# Patient Record
Sex: Male | Born: 1945
Health system: Southern US, Community
[De-identification: ages and names within clinical notes are randomized; demographics above are authoritative.]

## PROBLEM LIST (undated history)

## (undated) DIAGNOSIS — E039 Hypothyroidism, unspecified: Secondary | ICD-10-CM

## (undated) DIAGNOSIS — I2510 Atherosclerotic heart disease of native coronary artery without angina pectoris: Secondary | ICD-10-CM

## (undated) DIAGNOSIS — N400 Enlarged prostate without lower urinary tract symptoms: Secondary | ICD-10-CM

## (undated) DIAGNOSIS — E785 Hyperlipidemia, unspecified: Secondary | ICD-10-CM

## (undated) DIAGNOSIS — G4733 Obstructive sleep apnea (adult) (pediatric): Secondary | ICD-10-CM

## (undated) DIAGNOSIS — I4729 Other ventricular tachycardia: Secondary | ICD-10-CM

## (undated) DIAGNOSIS — Z95810 Presence of automatic (implantable) cardiac defibrillator: Secondary | ICD-10-CM

## (undated) DIAGNOSIS — I472 Ventricular tachycardia: Secondary | ICD-10-CM

## (undated) DIAGNOSIS — I639 Cerebral infarction, unspecified: Secondary | ICD-10-CM

## (undated) DIAGNOSIS — E119 Type 2 diabetes mellitus without complications: Secondary | ICD-10-CM

## (undated) DIAGNOSIS — R739 Hyperglycemia, unspecified: Secondary | ICD-10-CM

## (undated) HISTORY — PX: CARDIAC DEFIBRILLATOR PLACEMENT: SHX171

## (undated) HISTORY — DX: Other ventricular tachycardia: I47.29

## (undated) HISTORY — DX: Presence of automatic (implantable) cardiac defibrillator: Z95.810

## (undated) HISTORY — DX: Type 2 diabetes mellitus without complications: E11.9

## (undated) HISTORY — DX: Ventricular tachycardia: I47.2

## (undated) HISTORY — DX: Obstructive sleep apnea (adult) (pediatric): G47.33

## (undated) HISTORY — DX: Hypothyroidism, unspecified: E03.9

## (undated) HISTORY — DX: Hyperlipidemia, unspecified: E78.5

## (undated) HISTORY — DX: Atherosclerotic heart disease of native coronary artery without angina pectoris: I25.10

---

## 1979-06-22 HISTORY — PX: ABDOMINAL SURGERY: SHX537

## 2014-03-25 ENCOUNTER — Ambulatory Visit: Payer: Self-pay | Admitting: Internal Medicine

## 2014-04-23 ENCOUNTER — Ambulatory Visit: Payer: Self-pay | Admitting: Internal Medicine

## 2014-04-25 ENCOUNTER — Encounter: Payer: Self-pay | Admitting: *Deleted

## 2014-05-08 ENCOUNTER — Encounter: Payer: Self-pay | Admitting: Internal Medicine

## 2014-05-08 ENCOUNTER — Ambulatory Visit (INDEPENDENT_AMBULATORY_CARE_PROVIDER_SITE_OTHER): Payer: Medicare Other | Admitting: Internal Medicine

## 2014-05-08 VITALS — BP 128/82 | HR 56 | Ht 75.0 in | Wt 223.0 lb

## 2014-05-08 DIAGNOSIS — Z95810 Presence of automatic (implantable) cardiac defibrillator: Secondary | ICD-10-CM | POA: Diagnosis not present

## 2014-05-08 DIAGNOSIS — I472 Ventricular tachycardia, unspecified: Secondary | ICD-10-CM

## 2014-05-08 DIAGNOSIS — I255 Ischemic cardiomyopathy: Secondary | ICD-10-CM | POA: Diagnosis not present

## 2014-05-08 DIAGNOSIS — I4589 Other specified conduction disorders: Secondary | ICD-10-CM | POA: Diagnosis not present

## 2014-05-08 DIAGNOSIS — I4729 Other ventricular tachycardia: Secondary | ICD-10-CM

## 2014-05-08 DIAGNOSIS — R001 Bradycardia, unspecified: Secondary | ICD-10-CM | POA: Diagnosis not present

## 2014-05-08 LAB — MDC_IDC_ENUM_SESS_TYPE_INCLINIC
Date Time Interrogation Session: 20151118050000
HIGH POWER IMPEDANCE MEASURED VALUE: 40 Ohm
HIGH POWER IMPEDANCE MEASURED VALUE: 52 Ohm
Lead Channel Impedance Value: 547 Ohm
Lead Channel Pacing Threshold Amplitude: 1.1 V
Lead Channel Pacing Threshold Pulse Width: 0.4 ms
Lead Channel Sensing Intrinsic Amplitude: 8.4 mV
Lead Channel Setting Sensing Sensitivity: 0.6 mV
MDC IDC PG SERIAL: 102535
MDC IDC SET LEADCHNL RV PACING AMPLITUDE: 2.4 V
MDC IDC SET LEADCHNL RV PACING PULSEWIDTH: 0.4 ms
MDC IDC SET ZONE DETECTION INTERVAL: 300 ms
Zone Setting Detection Interval: 353 ms

## 2014-05-08 NOTE — Progress Notes (Signed)
ELECTROPHYSIOLOGY CONSULT NOTE  Patient ID: Grant [name], MRN: [id number], DOB/AGE: [birth year] 68 y.o. Admit date: (Not on file) Date of Consult: 05/08/2014  Primary Physician: No primary care provider on file. Primary Cardiologist: new  Chief Complaint: b    HPI Grant HurtRobert Junior Zamorano is a 68 y.o. male  To establish cardiovascular care hearing [place].  He has a history of coronary artery disease  he has a  history of nonsustained ventricular tachycardia described in the chart as polymorphic for which  He  underwent ICD implantation initially in 2007; apparently after that he underwent catheterization and was stented. This raises the possibility of my mind that he was having ischemic polymorphic VT ultimately treated by stenting.  He underwent generator replacement/13. At which time his right atrial lead was apparently. We do not have records as to why this was the case. He has complaints of exercise intolerance manifested by dyspnea on exertion at less than one flight of stairs. He has intermittent peripheral edema. He's had no significant chest discomfort. He also notes exertional discomfort and tingling in his hands bilaterally. .     This  is a [employer], [employer] device-single-chamber. Most recent echocardiogram 9/13 demonstrated ejection fraction of 50-55%.  He has a remote history of alcohol abuse. He continues to smoke  Past Medical History  Diagnosis Date  . CAD (coronary artery disease)   . NSVT (nonsustained ventricular tachycardia)   . AICD (automatic cardioverter/defibrillator) present   . DM (diabetes mellitus)   . HLD (hyperlipidemia)   . Hypothyroidism   . Obstructive sleep apnea       Surgical History: No past surgical history on file.   Home Meds: Prior to Admission medications   Medication Sig Start Date End Date Taking? Authorizing Provider  albuterol (PROVENTIL HFA;VENTOLIN HFA) 108 (90 BASE) MCG/ACT inhaler Inhale 2 puffs into the lungs every  6 (six) hours as needed for wheezing or shortness of breath.   Yes Historical Provider, MD  aspirin 325 MG tablet Take 325 mg by mouth.   Yes Historical Provider, MD  atorvastatin (LIPITOR) 40 MG tablet Take 40 mg by mouth daily. Take 1/2 tablet daily   Yes Historical Provider, MD  furosemide (LASIX) 20 MG tablet Take 20 mg by mouth 2 (two) times daily.   Yes Historical Provider, MD  LEVOTHYROXINE SODIUM PO Take by mouth daily. Dosage unknown.[name]. Confer with PT.Marland Kitchen.   Yes Historical Provider, MD  losartan (COZAAR) 100 MG tablet Take 100 mg by mouth daily.   Yes Historical Provider, MD  metoprolol (LOPRESSOR) 50 MG tablet Take 50 mg by mouth 2 (two) times daily.   Yes Historical Provider, MD  nitroGLYCERIN (NITROSTAT) 0.4 MG SL tablet Place 0.4 mg under the tongue every 5 (five) minutes as needed for chest pain.   Yes Historical Provider, MD  omeprazole (PRILOSEC) 20 MG capsule Take 20 mg by mouth daily.   Yes Historical Provider, MD  potassium chloride (KLOR-CON) 20 MEQ packet Take 20 mEq by mouth daily.   Yes Historical Provider, MD  spironolactone (ALDACTONE) 25 MG tablet Take 25 mg by mouth daily.   Yes Historical Provider, MD  terazosin (HYTRIN) 2 MG capsule Take 2 mg by mouth at bedtime.   Yes Historical Provider, MD      Allergies: No Known Allergies  History   Social History  . Marital Status: Married    Spouse Name: N/A    Number of Children: N/A  . Years of Education: N/A  Occupational History  . Not on file.   Social History Main Topics  . Smoking status: Current Every Day Smoker  . Smokeless tobacco: Not on file  . Alcohol Use: Not on file  . Drug Use: Not on file  . Sexual Activity: Not on file   Other Topics Concern  . Not on file   Social History Narrative     Family History  Problem Relation Age of Onset  . CAD Mother   . Hypertension Mother   . Cancer - Colon Father   . Cancer - Colon Brother      ROS:  Please see the history of present illness.     All  other systems reviewed and negative.    Physical Exam:   Blood pressure 128/82, pulse 56, height 6\' 3"  (1.905 m), weight 101.152 kg (223 lb). General: Well developed, well nourished male in no acute distress. Head: Normocephalic, atraumatic, sclera non-icteric, no xanthomas, nares are without discharge. EENT: normal Lymph Nodes:  none Back: without scoliosis/kyphosis , no CVA tendersness Neck: Negative for carotid bruits. JVD not elevated. Lungs: Clear bilaterally to auscultation without wheezes, rales, or rhonchi. Breathing is unlabored. Heart: RRR with S1 S2. No * murmur , rubs, or gallops appreciated. Abdomen: Soft, non-tender, non-distended with normoactive bowel sounds. No hepatomegaly. No rebound/guarding. No obvious abdominal masses. Msk:  Strength and tone appear normal for age. Extremities: No clubbing or cyanosis. No  edema.  Distal pedal pulses are 2+ and equal bilaterally. Skin: Warm and Dry Neuro: Alert and oriented X 3. CN III-XII intact Grossly normal sensory and motor function . Psych:  Responds to questions appropriately with a normal affect.      Labs:  EKG: NSR 54 15./09/42 Poss imi  Assessment and Plan:   Implantable defibrillator   ischemic heart disease with prior stenting  Obstructive sleep apnea  Ventricular tachycardia-polymorphic  Dyspnea on exertion  Sinus bradycardia  The patient's present defibrillator for which the atrial [facility]. The reasons are not clear. He has resting bradycardia and dyspnea on exertion and the first question that is prompted is whether he has chronotropic incompetence. We discussed various strategies but we'll pursue treadmill testing to assess this.  I reviewed with him the importance of monitoring of his blood work for his Aldactone and other medications. Recheck his metabolic profile today. I will see him again in 6 months time in this regard.        Sherryl MangesSteven  Klein

## 2014-05-08 NOTE — Patient Instructions (Signed)
Your physician recommends that you continue on your current medications as directed. Please refer to the Current Medication list given to you today.  Labs today: CBCD, CMET  Your physician has requested that you have an exercise tolerance test. For further information please visit [email or web address]. Please also follow instruction sheet, as given.  Your physician wants you to follow-up in: 6 months with Dr. Graciela HusbandsKlein. You will receive a reminder letter in the mail two months in advance. If you don't receive a letter, please call our office to schedule the follow-up appointment.

## 2014-05-09 LAB — COMPREHENSIVE METABOLIC PANEL
ALK PHOS: 62 U/L (ref 39–117)
ALT: 32 U/L (ref 0–53)
AST: 32 U/L (ref 0–37)
Albumin: 4.1 g/dL (ref 3.5–5.2)
BILIRUBIN TOTAL: 0.5 mg/dL (ref 0.2–1.2)
BUN: 10 mg/dL (ref 6–23)
CO2: 29 meq/L (ref 19–32)
Calcium: 8.7 mg/dL (ref 8.4–10.5)
Chloride: 105 mEq/L (ref 96–112)
Creatinine, Ser: 1.4 mg/dL (ref 0.4–1.5)
GFR: 66.16 mL/min (ref >60.00)
Glucose, Bld: 92 mg/dL (ref 70–99)
POTASSIUM: 4.2 meq/L (ref 3.5–5.1)
SODIUM: 141 meq/L (ref 135–145)
TOTAL PROTEIN: 6.8 g/dL (ref 6.0–8.3)

## 2014-05-09 LAB — CBC WITH DIFFERENTIAL/PLATELET
BASOS ABS: 0 10*3/uL (ref 0.0–0.1)
Basophils Relative: 0.5 % (ref 0.0–3.0)
EOS ABS: 0.1 10*3/uL (ref 0.0–0.7)
Eosinophils Relative: 1.5 % (ref 0.0–5.0)
HEMATOCRIT: 46.9 % (ref 39.0–52.0)
Hemoglobin: 15.6 g/dL (ref 13.0–17.0)
LYMPHS ABS: 3.3 10*3/uL (ref 0.7–4.0)
Lymphocytes Relative: 41.5 % (ref 12.0–46.0)
MCHC: 33.2 g/dL (ref 30.0–36.0)
MCV: 91.1 fl (ref 78.0–100.0)
MONO ABS: 0.5 10*3/uL (ref 0.1–1.0)
Monocytes Relative: 6.4 % (ref 3.0–12.0)
Neutro Abs: 4 10*3/uL (ref 1.4–7.7)
Neutrophils Relative %: 50.1 % (ref 43.0–77.0)
Platelets: 189 10*3/uL (ref 150.0–400.0)
RBC: 5.15 Mil/uL (ref 4.22–5.81)
RDW: 13 % (ref 11.5–15.5)
WBC: 8 10*3/uL (ref 4.0–10.5)

## 2014-05-14 ENCOUNTER — Encounter: Payer: Self-pay | Admitting: Internal Medicine

## 2014-05-28 ENCOUNTER — Encounter: Payer: Medicare Other | Admitting: Nurse Practitioner

## 2014-06-04 ENCOUNTER — Ambulatory Visit (INDEPENDENT_AMBULATORY_CARE_PROVIDER_SITE_OTHER): Payer: Medicare Other | Admitting: Internal Medicine

## 2014-06-04 ENCOUNTER — Other Ambulatory Visit (INDEPENDENT_AMBULATORY_CARE_PROVIDER_SITE_OTHER): Payer: Medicare Other

## 2014-06-04 ENCOUNTER — Encounter: Payer: Self-pay | Admitting: Internal Medicine

## 2014-06-04 VITALS — BP 138/90 | HR 55 | Temp 98.1°F | Resp 16 | Ht 74.0 in | Wt 225.0 lb

## 2014-06-04 DIAGNOSIS — E039 Hypothyroidism, unspecified: Secondary | ICD-10-CM | POA: Insufficient documentation

## 2014-06-04 DIAGNOSIS — E119 Type 2 diabetes mellitus without complications: Secondary | ICD-10-CM | POA: Diagnosis not present

## 2014-06-04 DIAGNOSIS — E785 Hyperlipidemia, unspecified: Secondary | ICD-10-CM | POA: Diagnosis not present

## 2014-06-04 DIAGNOSIS — Z720 Tobacco use: Secondary | ICD-10-CM | POA: Diagnosis not present

## 2014-06-04 DIAGNOSIS — M150 Primary generalized (osteo)arthritis: Secondary | ICD-10-CM

## 2014-06-04 DIAGNOSIS — N400 Enlarged prostate without lower urinary tract symptoms: Secondary | ICD-10-CM | POA: Diagnosis not present

## 2014-06-04 DIAGNOSIS — I255 Ischemic cardiomyopathy: Secondary | ICD-10-CM

## 2014-06-04 DIAGNOSIS — M159 Polyosteoarthritis, unspecified: Secondary | ICD-10-CM

## 2014-06-04 DIAGNOSIS — I10 Essential (primary) hypertension: Secondary | ICD-10-CM | POA: Insufficient documentation

## 2014-06-04 DIAGNOSIS — I2510 Atherosclerotic heart disease of native coronary artery without angina pectoris: Secondary | ICD-10-CM | POA: Insufficient documentation

## 2014-06-04 DIAGNOSIS — E1122 Type 2 diabetes mellitus with diabetic chronic kidney disease: Secondary | ICD-10-CM | POA: Insufficient documentation

## 2014-06-04 DIAGNOSIS — M1990 Unspecified osteoarthritis, unspecified site: Secondary | ICD-10-CM | POA: Insufficient documentation

## 2014-06-04 DIAGNOSIS — F17210 Nicotine dependence, cigarettes, uncomplicated: Secondary | ICD-10-CM | POA: Insufficient documentation

## 2014-06-04 LAB — LIPID PANEL
CHOL/HDL RATIO: 7
CHOLESTEROL: 220 mg/dL — AB (ref 0–200)
HDL: 32.7 mg/dL — AB (ref >39.00)
NonHDL: 187.3
Triglycerides: 244 mg/dL — ABNORMAL HIGH (ref 0.0–149.0)
VLDL: 48.8 mg/dL — AB (ref 0.0–40.0)

## 2014-06-04 LAB — TSH: TSH: 2.34 u[IU]/mL (ref 0.35–4.50)

## 2014-06-04 MED ORDER — TERAZOSIN HCL 5 MG PO CAPS: 5.0000 mg | ORAL_CAPSULE | Freq: Every day | ORAL | Status: DC

## 2014-06-04 NOTE — Assessment & Plan Note (Signed)
Patient currently taking 2 mg terazosin with unsatisfying results. Will increase to 5 mg daily.

## 2014-06-04 NOTE — Assessment & Plan Note (Signed)
Does fairly well overall, occasionally has pains. Advised him that Tylenol is the safest thing he can take for his pain.

## 2014-06-04 NOTE — Patient Instructions (Signed)
We will have you increase your terazosin. Until you run out take 2 pills at nighttime (4 mg total). When you fill the new prescription it will be for 5 mg so take 1 pill at nighttime (5 mg total).  We will check your blood work today and call you back with the results.  We will see back next year for your physical. If you have any new problem is her questions before then please feel free to call our office.

## 2014-06-04 NOTE — Progress Notes (Signed)
Pre visit review using our clinic review tool, if applicable. No additional management support is needed unless otherwise documented below in the visit note. 

## 2014-06-04 NOTE — Progress Notes (Signed)
   Subjective:    Patient ID: Grant [name], male    DOB: 1945-09-21, 68 y.o.   MRN: [id number]  HPI Patient is a 68 year old man who comes in today as a new patient. He does have past medical history of diet-controlled diabetes, arthritis, high blood pressure, CAD, he does have an ICD in place. He denies any current chest pains, shortness of breath, abdominal pains. He did have colonoscopy about 5 years ago. He does have osteoarthritis and states it is fairly severe in his knees as well as his thumbs bilaterally. He denies any balance problems or falls. He does also have a new complaint which is that his BPH is not very well controlled. He currently has medication that he takes at night time however he still gets up to be in the night and he also has times where he needs to run to the bathroom during the day. He denies any urinary retention.  Review of Systems  Constitutional: Negative for fever, activity change, appetite change, fatigue and unexpected weight change.  HENT: Negative.   Respiratory: Negative for cough, chest tightness, shortness of breath and wheezing.   Cardiovascular: Negative for chest pain, palpitations and leg swelling.  Gastrointestinal: Negative for nausea, abdominal pain, diarrhea, constipation and abdominal distention.  Genitourinary: Positive for frequency.       Nocturia  Musculoskeletal: Positive for arthralgias. Negative for myalgias, back pain, joint swelling and gait problem.  Skin: Negative.   Neurological: Negative for dizziness, syncope, weakness, light-headedness and headaches.      Objective:   Physical Exam  Constitutional: He is oriented to person, place, and time. He appears well-developed and well-nourished.  HENT:  Head: Normocephalic and atraumatic.  Eyes: EOM are normal.  Neck: Normal range of motion.  Cardiovascular: Normal rate and regular rhythm.   Pulmonary/Chest: Effort normal and breath sounds normal. No respiratory distress. He has  no wheezes. He has no rales.  Abdominal: Soft. Bowel sounds are normal.  Neurological: He is alert and oriented to person, place, and time. Coordination normal.  Skin: Skin is warm and dry.   Filed Vitals:   06/04/14 1404  BP: 138/90  Pulse: 55  Temp: 98.1 F (36.7 C)  TempSrc: Oral  Resp: 16  Height: 6\' 2"  (1.88 m)  Weight: 225 lb (102.059 kg)  SpO2: 96%      Assessment & Plan:

## 2014-06-04 NOTE — Assessment & Plan Note (Signed)
Patient smoking about half pack per day. Did remind  Him of the risks and harms associated with tobacco smoke. He does not wish to quit at this time.

## 2014-06-04 NOTE — Assessment & Plan Note (Addendum)
[facility] Foot exam, eye exam done in September of this year. He is unclear if they've checked his urine for protein recently. Patient on ARB. Will get records.

## 2014-06-04 NOTE — Assessment & Plan Note (Signed)
Check TSH, patient unclear what dosing of thyroid medication he is taking. Will call us when he gets home to see the bottle.

## 2014-06-05 LAB — LDL CHOLESTEROL, DIRECT: LDL DIRECT: 139.6 mg/dL

## 2014-07-02 ENCOUNTER — Telehealth: Payer: Self-pay | Admitting: Internal Medicine

## 2014-07-02 NOTE — Telephone Encounter (Signed)
Pt requests call [phone], pt having problems controlling his bladder, wants to speak to assistant.

## 2014-07-03 NOTE — Telephone Encounter (Signed)
I spoke with patient. He is having frequent urination and we have scheduled an appointment for him to come in and be seen.

## 2014-07-05 ENCOUNTER — Encounter: Payer: Self-pay | Admitting: Internal Medicine

## 2014-07-05 ENCOUNTER — Ambulatory Visit (INDEPENDENT_AMBULATORY_CARE_PROVIDER_SITE_OTHER): Payer: Medicare Other | Admitting: Internal Medicine

## 2014-07-05 VITALS — BP 140/82 | HR 59 | Temp 98.1°F | Resp 14 | Ht 75.0 in | Wt 229.0 lb

## 2014-07-05 DIAGNOSIS — N400 Enlarged prostate without lower urinary tract symptoms: Secondary | ICD-10-CM | POA: Diagnosis not present

## 2014-07-05 DIAGNOSIS — R350 Frequency of micturition: Secondary | ICD-10-CM | POA: Diagnosis not present

## 2014-07-05 LAB — POCT URINALYSIS DIPSTICK
Bilirubin, UA: NEGATIVE
Blood, UA: NEGATIVE
Glucose, UA: NEGATIVE
Ketones, UA: NEGATIVE
LEUKOCYTES UA: NEGATIVE
Nitrite, UA: NEGATIVE
PROTEIN UA: NEGATIVE
Spec Grav, UA: 1.02
UROBILINOGEN UA: 0.2
pH, UA: 7

## 2014-07-05 MED ORDER — TERAZOSIN HCL 5 MG PO CAPS: 5.0000 mg | ORAL_CAPSULE | Freq: Two times a day (BID) | ORAL | Status: DC

## 2014-07-05 NOTE — Patient Instructions (Signed)
You do not have a urine infection. We will increase your terazosin to twice a day. It seems to be doing a good job at night time but not as good during the day. Take 1 pill in the morning and 1 pill at night time before bed.  If you are still having problems please feel free to call us back.   Benign Prostatic Hypertrophy The prostate gland is part of the reproductive system of men. A normal prostate is about the size and shape of a walnut. The prostate gland produces a fluid that is mixed with sperm to make semen. This gland surrounds the urethra and is located in front of the rectum and just below the bladder. The bladder is where urine is stored. The urethra is the tube through which urine passes from the bladder to get out of the body. The prostate grows as a man ages. An enlarged prostate not caused by cancer is called benign prostatic hypertrophy (BPH). An enlarged prostate can press on the urethra. This can make it harder to pass urine. In the early stages of enlargement, the bladder can get by with a narrowed urethra by forcing the urine through. If the problem gets worse, medical or surgical treatment may be required.  This condition should be followed by your health care provider. The accumulation of urine in the bladder can cause infection. Back pressure and infection can progress to bladder damage and kidney (renal) failure. If needed, your health care provider may refer you to a specialist in kidney and prostate disease (urologist). CAUSES  BPH is a common health problem in men older than 50 years. This condition is a normal part of aging. However, not all men will develop problems from this condition. If the enlargement grows away from the urethra, then there will not be any compression of the urethra and resistance to urine flow.If the growth is toward the urethra and compresses it, you will experience difficulty urinating.  SYMPTOMS   Not able to completely empty your bladder.  Getting  up often during the night to urinate.  Need to urinate frequently during the day.  Difficultly starting urine flow.  Decrease in size and strength of your urine stream.  Dribbling after urination.  Pain on urination (more common with infection).  Inability to pass urine. This needs immediate treatment.  The development of a urinary tract infection. DIAGNOSIS  These tests will help your health care provider understand your problem:  A thorough history and physical examination.  A urination history, with the number of times you urinate, the amounts of urine, the strength of the urine stream, and the feeling of emptiness or fullness after urinating.  A postvoid bladder scan that measures any amount of urine that may remain in your bladder after you finish urinating.  Digital rectal exam. In a rectal exam, your health care provider checks your prostate by putting a gloved, lubricated finger into your rectum to feel the back of your prostate gland. This exam detects the size of your gland and abnormal lumps or growths.  Exam of your urine (urinalysis).  Prostate specific antigen (PSA) screening. This is a blood test used to screen for prostate cancer.  Rectal ultrasonography. This test uses sound waves to electronically produce a picture of your prostate gland. TREATMENT  Once symptoms begin, your health care provider will monitor your condition. Of the men with this condition, one third will have symptoms that stabilize, one third will have symptoms that improve, and one  third will have symptoms that progress in the first year. Mild symptoms may not need treatment. Simple observation and yearly exams may be all that is required. Medicines and surgery are options for more severe problems. Your health care provider can help you make an informed decision for what is best. Two classes of medicines are available for relief of prostate symptoms:  Medicines that shrink the prostate. This  helps relieve symptoms. These medicines take time to work, and it may be months before any improvement is seen.  Uncommon side effects include problems with sexual function.  Medicines to relax the muscle of the prostate. This also relieves the obstruction by reducing any compression on the urethra.This group of medicines work much faster than those that reduce the size of the prostate gland. Usually, one can experience improvement in days to weeks..  Side effects can include dizziness, fatigue, lightheadedness, and retrograde ejaculation (diminished volume of ejaculate). Several types of surgical treatments are available for relief of prostate symptoms:  Transurethral resection of the prostate (TURP)--In this treatment, an instrument is inserted through opening at the tip of the penis. It is used to cut away pieces of the inner core of the prostate. The pieces are removed through the same opening of the penis. This removes the obstruction and helps get rid of the symptoms.  Transurethral incision (TUIP)--In this procedure, small cuts are made in the prostate. This lessens the prostates pressure on the urethra.  Transurethral microwave thermotherapy (TUMT)--This procedure uses microwaves to create heat. The heat destroys and removes a small amount of prostate tissue.  Transurethral needle ablation (TUNA)--This is a procedure that uses radio frequencies to do the same as TUMT.  Interstitial laser coagulation (ILC)--This is a procedure that uses a laser to do the same as TUMT and TUNA.  Transurethral electrovaporization (TUVP)--This is a procedure that uses electrodes to do the same as the procedures listed above. SEEK MEDICAL CARE IF:   You develop a fever.  There is unexplained back pain.  Symptoms are not helped by medicines prescribed.  You develop side effects from the medicine you are taking.  Your urine becomes very dark or has a bad smell.  Your lower abdomen becomes distended  and you have difficulty passing your urine. SEEK IMMEDIATE MEDICAL CARE IF:   You are suddenly unable to urinate. This is an emergency. You should be seen immediately.  There are large amounts of blood or clots in the urine.  Your urinary problems become unmanageable.  You develop lightheadedness, severe dizziness, or you feel faint.  You develop moderate to severe low back or flank pain.  You develop chills or fever. Document Released: 06/07/2005 Document Revised: 06/12/2013 Document Reviewed: 12/21/2012 [facility] Patient Information 2015 [employer] [place]. This information is not intended to replace advice given to you by your health care provider. Make sure you discuss any questions you have with your health care provider.

## 2014-07-05 NOTE — Assessment & Plan Note (Signed)
Increase terazosin to BID 5 mg. He is doing better with nighttime urination but having frequency and urgency during the day. POC urine dipstick without signs of infection or prostatitis.

## 2014-07-05 NOTE — Progress Notes (Signed)
   Subjective:    Patient ID: Grant [name], male    DOB: 1945/10/26, 69 y.o.   MRN: [id number]  HPI The patient is a 69 YO man who is coming in acutely with urinary frequency. We did increase the dosing of terazosin at last visit. He is doing better at night time but still having frequency during the day and having to run to bathrooms. He denies any pain or discharge with urination. Denies fevers or chills. He denies suprapubic tenderness.   Review of Systems  Constitutional: Negative.   Respiratory: Negative.   Cardiovascular: Negative.   Genitourinary: Positive for urgency and frequency. Negative for dysuria, flank pain, decreased urine volume, discharge, enuresis, difficulty urinating and genital sores.  Musculoskeletal: Negative.   Neurological: Negative.       Objective:   Physical Exam  Constitutional: He is oriented to person, place, and time. He appears well-developed and well-nourished.  HENT:  Head: Normocephalic and atraumatic.  Eyes: EOM are normal.  Neck: Normal range of motion.  Cardiovascular: Normal rate and regular rhythm.   Pulmonary/Chest: Effort normal and breath sounds normal. No respiratory distress. He has no wheezes. He has no rales.  Abdominal: Soft. Bowel sounds are normal.  Neurological: He is alert and oriented to person, place, and time. Coordination normal.  Skin: Skin is warm and dry.   Filed Vitals:   07/05/14 1328  BP: 140/82  Pulse: 59  Temp: 98.1 F (36.7 C)  TempSrc: Oral  Resp: 14  Height: 6\' 3"  (1.905 m)  Weight: 229 lb (103.874 kg)  SpO2: 97%      Assessment & Plan:

## 2014-07-05 NOTE — Progress Notes (Signed)
Pre visit review using our clinic review tool, if applicable. No additional management support is needed unless otherwise documented below in the visit note. 

## 2014-07-09 ENCOUNTER — Encounter: Payer: Medicare Other | Admitting: Physician Assistant

## 2014-08-06 ENCOUNTER — Telehealth: Payer: Self-pay | Admitting: Internal Medicine

## 2014-08-06 NOTE — Telephone Encounter (Signed)
Informed pt that transmission is automatic. Pt verbalized understanding.

## 2014-08-06 NOTE — Telephone Encounter (Signed)
New Msg        Pt calling to info about remote device check.   States he needs instructions on what to do.  Please return call.

## 2014-08-08 ENCOUNTER — Ambulatory Visit (INDEPENDENT_AMBULATORY_CARE_PROVIDER_SITE_OTHER): Payer: Medicare Other | Admitting: *Deleted

## 2014-08-08 DIAGNOSIS — I255 Ischemic cardiomyopathy: Secondary | ICD-10-CM | POA: Diagnosis not present

## 2014-08-08 LAB — MDC_IDC_ENUM_SESS_TYPE_REMOTE
Battery Remaining Longevity: 132 mo
Brady Statistic RV Percent Paced: 0 %
Date Time Interrogation Session: 20160218162400
HighPow Impedance: 47 Ohm
Lead Channel Impedance Value: 522 Ohm
Lead Channel Pacing Threshold Amplitude: 1.1 V
Lead Channel Setting Pacing Pulse Width: 0.4 ms
MDC IDC MSMT BATTERY REMAINING PERCENTAGE: 100 %
MDC IDC MSMT LEADCHNL RV PACING THRESHOLD PULSEWIDTH: 0.4 ms
MDC IDC PG SERIAL: 102535
MDC IDC SET LEADCHNL RV PACING AMPLITUDE: 2.4 V
MDC IDC SET LEADCHNL RV SENSING SENSITIVITY: 0.6 mV
Zone Setting Detection Interval: 300 ms
Zone Setting Detection Interval: 353 ms

## 2014-08-08 NOTE — Progress Notes (Signed)
Remote ICD transmission.   

## 2014-08-28 ENCOUNTER — Encounter: Payer: Self-pay | Admitting: *Deleted

## 2014-08-29 ENCOUNTER — Encounter: Payer: Self-pay | Admitting: Internal Medicine

## 2014-11-22 ENCOUNTER — Encounter: Payer: Self-pay | Admitting: *Deleted

## 2015-03-04 ENCOUNTER — Ambulatory Visit (INDEPENDENT_AMBULATORY_CARE_PROVIDER_SITE_OTHER): Payer: Medicare Other | Admitting: Internal Medicine

## 2015-03-04 ENCOUNTER — Encounter: Payer: Self-pay | Admitting: Internal Medicine

## 2015-03-04 VITALS — BP 122/82 | HR 57 | Ht 74.0 in | Wt 233.2 lb

## 2015-03-04 DIAGNOSIS — I472 Ventricular tachycardia, unspecified: Secondary | ICD-10-CM

## 2015-03-04 DIAGNOSIS — I255 Ischemic cardiomyopathy: Secondary | ICD-10-CM

## 2015-03-04 LAB — CUP PACEART INCLINIC DEVICE CHECK
Date Time Interrogation Session: 20160913040000
HIGH POWER IMPEDANCE MEASURED VALUE: 49 Ohm
HighPow Impedance: 40 Ohm
Lead Channel Pacing Threshold Amplitude: 1.1 V
Lead Channel Pacing Threshold Pulse Width: 0.4 ms
Lead Channel Setting Sensing Sensitivity: 0.6 mV
MDC IDC MSMT LEADCHNL RV IMPEDANCE VALUE: 534 Ohm
MDC IDC MSMT LEADCHNL RV SENSING INTR AMPL: 7.1 mV
MDC IDC SET LEADCHNL RV PACING AMPLITUDE: 2.4 V
MDC IDC SET LEADCHNL RV PACING PULSEWIDTH: 0.4 ms
MDC IDC SET ZONE DETECTION INTERVAL: 353 ms
MDC IDC STAT BRADY RV PERCENT PACED: 1 % — AB
Pulse Gen Serial Number: 102535
Zone Setting Detection Interval: 300 ms

## 2015-03-04 LAB — BASIC METABOLIC PANEL
BUN: 9 mg/dL (ref 6–23)
CHLORIDE: 104 meq/L (ref 96–112)
CO2: 31 mEq/L (ref 19–32)
Calcium: 8.7 mg/dL (ref 8.4–10.5)
Creatinine, Ser: 1.26 mg/dL (ref 0.40–1.50)
GFR: 72.7 mL/min (ref >60.00)
GLUCOSE: 128 mg/dL — AB (ref 70–99)
Potassium: 3.6 mEq/L (ref 3.5–5.1)
Sodium: 141 mEq/L (ref 135–145)

## 2015-03-04 NOTE — Patient Instructions (Signed)
Medication Instructions: - no changes  Labwork: - Your physician recommends that you have lab work today: BMP  Procedures/Testing: - none  Follow-Up: - Remote monitoring is used to monitor your Pacemaker of ICD from home. This monitoring reduces the number of office visits required to check your device to one time per year. It allows [place] to keep an eye on the functioning of your device to ensure it is working properly. You are scheduled for a device check from home on 06/03/15. You may send your transmission at any time that day. If you have a wireless device, the transmission will be sent automatically. After your physician reviews your transmission, you will receive a postcard with your next transmission date.  - Your physician wants you to follow-up in: 1 year with Dr. Graciela Husbands. You will receive a reminder letter in the mail two months in advance. If you don't receive a letter, please call our office to schedule the follow-up appointment.  Any Additional Special Instructions Will Be Listed Below (If Applicable). - none

## 2015-03-04 NOTE — Progress Notes (Signed)
      Patient Care Team: Judie Bonus, MD as PCP - General (Internal Medicine)   HPI  Grant [name] is a 69 y.o. male Seen in follow-up for ICD implanted for ventricular tachycardia in the context of ischemic heart disease prior stenting. He had intervertebral normalization of LV function with an echo EF 2013 55%.  Noted when first evaluated 11/15 to have resting bradycardia  we had anticipated exercise treadmill testing which was never accomplished    Records and Results Reviewed  Past Medical History  Diagnosis Date  . CAD (coronary artery disease)   . NSVT (nonsustained ventricular tachycardia)   . AICD (automatic cardioverter/defibrillator) present   . DM (diabetes mellitus)   . HLD (hyperlipidemia)   . Hypothyroidism   . Obstructive sleep apnea     Past Surgical History  Procedure Laterality Date  . Abdominal surgery  1981    repair lungs, liver, and intenstines from trauma    Current Outpatient Prescriptions  Medication Sig Dispense Refill  . albuterol (PROVENTIL HFA;VENTOLIN HFA) 108 (90 BASE) MCG/ACT inhaler Inhale 2 puffs into the lungs every 6 (six) hours as needed for wheezing or shortness of breath.    Marland Kitchen aspirin 325 MG tablet Take 325 mg by mouth.    Marland Kitchen atorvastatin (LIPITOR) 40 MG tablet Take 40 mg by mouth daily. Take 1/2 tablet daily    . furosemide (LASIX) 20 MG tablet Take 20 mg by mouth 2 (two) times daily.    Marland Kitchen LEVOTHYROXINE SODIUM PO Take by mouth daily. Dosage unknown.[name] with PT..    . losartan (COZAAR) 100 MG tablet Take 100 mg by mouth daily.    . metoprolol (LOPRESSOR) 50 MG tablet Take 50 mg by mouth 2 (two) times daily.    . nitroGLYCERIN (NITROSTAT) 0.4 MG SL tablet Place 0.4 mg under the tongue every 5 (five) minutes as needed for chest pain.    Marland Kitchen omeprazole (PRILOSEC) 20 MG capsule Take 20 mg by mouth daily.    Marland Kitchen spironolactone (ALDACTONE) 25 MG tablet Take 25 mg by mouth daily.    Marland Kitchen terazosin (HYTRIN) 5 MG capsule Take 1  capsule (5 mg total) by mouth 2 (two) times daily. 180 capsule 3   No current facility-administered medications for this visit.    No Known Allergies    Review of Systems negative except from HPI and PMH  Physical Exam BP 122/82 mmHg  Pulse 57  Ht  (1.88 m)  Wt 233 lb 3.2 oz (105.779 kg)  BMI 29.93 kg/m2 Well developed and well nourished in no acute distress HENT normal E scleral and icterus clear Neck Supple JVP flat; carotids brisk and full Clear to ausculation Device pocket well healed; without hematoma or erythema.  There is no tethering Regular rate and rhythm, no murmurs gallops or rub Soft with active bowel sounds No clubbing cyanosis  Edema Alert and oriented, grossly normal motor and sensory function Skin Warm and Dry  ECG  Sinus 57 19/10/42  Assessment and  Plan  Ischemic Cardiomyopathy--interval normalization  Ventricular Tachycardia  ICD  The patient's device was interrogated.  The information was reviewed. No changes were made in the programming.     No intercurrent Ventricular tachycardia  Without symptoms of ischemia

## 2015-03-25 ENCOUNTER — Encounter: Payer: Self-pay | Admitting: Internal Medicine

## 2015-04-10 ENCOUNTER — Telehealth: Payer: Self-pay

## 2015-04-10 NOTE — Telephone Encounter (Signed)
Patient called to educate on Medicare Wellness apt. LVM for the patient to call back to educate and schedule for wellness visit. Next apt is in Dec.

## 2015-04-14 NOTE — Telephone Encounter (Signed)
2nd outreach for AWV and the patient agreed to come in this Friday at 1pm

## 2015-04-18 ENCOUNTER — Telehealth: Payer: Self-pay

## 2015-04-18 ENCOUNTER — Ambulatory Visit (INDEPENDENT_AMBULATORY_CARE_PROVIDER_SITE_OTHER): Payer: Medicare Other

## 2015-04-18 VITALS — BP 130/80 | Ht 75.0 in | Wt 233.2 lb

## 2015-04-18 DIAGNOSIS — N400 Enlarged prostate without lower urinary tract symptoms: Secondary | ICD-10-CM

## 2015-04-18 DIAGNOSIS — Z0000 Encounter for general adult medical examination without abnormal findings: Secondary | ICD-10-CM

## 2015-04-18 MED ORDER — TERAZOSIN HCL 5 MG PO CAPS: 5.0000 mg | ORAL_CAPSULE | Freq: Two times a day (BID) | ORAL | Status: DC

## 2015-04-18 NOTE — Telephone Encounter (Signed)
Call to advise patient to call the office or to call the health coach back at [phone] to schedule fup apt [phone] Dr. Dorise HissKollar. Terazosin 5mg  bid approved and sent to pharmacy to be filled per VO Dr. Dorise HissKollar.  fup Apt to discuss Hep C results and plan of care.

## 2015-04-18 NOTE — Progress Notes (Signed)
Medical screening examination/treatment/procedure(s) were performed by non-physician practitioner and as supervising physician I was immediately available for consultation/collaboration. I agree with above. Alasia Enge A Yukio Bisping, MD 

## 2015-04-18 NOTE — Progress Notes (Signed)
Subjective:   Grant [name] is a 69 y.o. male who presents for Medicare Annual/Subsequent preventive examination.  Review of Systems:   Cardiac Risk Factors include: advanced age (>66men, >84 women);diabetes mellitus;dyslipidemia;hypertension;male gender;obesity (BMI >30kg/m2);smoking/ tobacco exposure HRA assessment completed during visit;  The Patient was informed that this wellness visit is to identify risk and educate on how to reduce risk for increase disease through lifestyle changes.   ROS deferred to CPE exam with physician Patient seen at the [facility]  Medical issues   Hx pain in chest x 4 weeks ago that was relieved by NTG; Dr, Grant [name];  Dr. Joseph [name] at [facility]; to notify doctor if he takes more than 2 NTG needed to relieve pain and to call 911 if pain is not relieved; [facility] more than 2 episodes of chest pain in a week; Gets NTG refilled q year.  States plan for chest pain Checks BS once a week; discussed checking his BS before eating in the am, then perhaps 2 hours after lunch or at hs.   CAD; HTN; Glucose (128) 02/2015; GFR 72;  06/04/2014; cho 220; Trig 244; HDL 32; LDL 139  Diet controlled DM/ A1c at [facility]; states it was good; will get results for Dr. Okey [name]. 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 Sits down at night and starts to think about his health and what he cannot do; Smokes after meals;  Adds a few drinks some evening approx 2 to 3 times per week; Wife states he skips some evenings;   ETOH; Drinks 2 to 3 a day; followed by a couple of shots; but not every  day States he drinks when he "starts thinking" and gets worried. Misses his profession; wants to weld and can't'; worries over health and defibrillator. Focused on strengths; social; can build; wife stated he his tool shed but doesn't make anything anymore. [name] admits he is grieving having to retire and now his health is not as good; C/o of fatigue.  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   Discussed replacing bad thoughts w good thoughts and motivated at level 6 to 7 to make a plan and follow though with one action every day to assist him in achieving his goal.  Also discussed possibility of part time work  4 children; one is here; [place], [place] Married x 3 years to childhood sweat heart.  4 children; one son local    BMI:  Vitals with BMI 03/04/2015 07/05/2014 06/04/2014 05/08/2014  BMI 30 28.7 28.9 27.9   Diet; breakfast; does not like eggs; Biscuits and gravy; tomatoes; oatmeal and wheat toast;  Lunch; healthy lunch; sandwich; chips; Supper; full course at dinner; green beans, potatoes; vegetables   Exercise; c/o of fatigue; states he "starts" thinking and is not doing as much; States he is up and down during the day and does not sit;  Tries to walk around the home but tires easily  SAFETY;  Safety reviewed for the home; clear paths through the home, eliminating clutter, railing as needed; bathroom safety; community safety; smoke detectors and carbon monoxide detectors; firearms safety reviewed  Driving accidents (No) and seatbelt (yes) Sun protection; stays out of sun  Medication review/ New meds  Fall assessment; slipped in bathtub but caught himself; Educated on fall risk safety and putting non skid mat in tub etc. Given information from [facility] on Fall prevention and safety proofing the home.  Gait assessment; no noted issues; Can't climb stairs well due to  sob  Mobilization and Functional losses in the last year. Sleep patterns   Urinary or fecal incontinence reviewed/ states med for prostate is helping   Counseling: Hepatitis C checked at [place];  Ua Microalbumin - Checked at [facility] Foot [facility] - [facility]; States [facility] was to send records and thought he had brought records, but do not see them on the chart EKG: 03/04/2015 Hearing: right ear 10% hearing loss; Left ear 80% hearing loss; has hearing aid for left ear but doesn't wear it all the time Ophthalmology exam; last [facility]; getting new glasses good for reading  Immunizations Due  Shingles Flu Prevnar; (PSV 23 02/2014)  Will [facility] at [facility]    Current Care Team reviewed and updated  Was seeing Dr. Remi Deter T. [name] for liver fup        Objective:    Vitals: BP 130/80 mmHg  Ht  (1.905 m)  Wt 233 lb 4 oz (105.802 kg)  BMI 29.15 kg/m2  Tobacco History  Smoking status  . Current Every Day Smoker -- 0.80 packs/day for 40 years  . Types: Cigarettes  Smokeless tobacco  . Current User  . Types: Chew    Comment: Quit x 6 months; with patches and pills      Ready to quit: Yes Counseling given: Not Answered   Past Medical History  Diagnosis Date  . CAD (coronary artery disease)   . NSVT (nonsustained ventricular tachycardia) (HCC)   . AICD (automatic cardioverter/defibrillator) present   . DM (diabetes mellitus) (HCC)   . HLD (hyperlipidemia)   . Hypothyroidism   . Obstructive sleep apnea    Past Surgical History  Procedure Laterality Date  . Abdominal surgery  1981    repair lungs, liver, and intenstines from trauma   Family History  Problem Relation Age of Onset  . CAD Mother   . Hypertension Mother   . Cancer - Colon Father   . Cancer - Colon Brother    History  Sexual Activity  . Sexual Activity: Not on file    Outpatient Encounter Prescriptions as of 04/18/2015  Medication Sig  . albuterol (PROVENTIL HFA;VENTOLIN HFA) 108 (90 BASE) MCG/ACT inhaler  Inhale 2 puffs into the lungs every 6 (six) hours as needed for wheezing or shortness of breath.  Marland Kitchen aspirin 325 MG tablet Take 325 mg by mouth.  Marland Kitchen atorvastatin (LIPITOR) 40 MG tablet Take 40 mg by mouth daily. Take 1/2 tablet daily  . furosemide (LASIX) 20 MG tablet Take 20 mg by mouth 2 (two) times daily.  Marland Kitchen LEVOTHYROXINE SODIUM PO Take by mouth daily. Dosage unknown.[name] with PT..  . losartan (COZAAR) 100 MG tablet Take 100 mg by mouth daily.  . metoprolol (LOPRESSOR) 50 MG tablet Take 50 mg by mouth 2 (two) times daily.  . nitroGLYCERIN (NITROSTAT) 0.4 MG SL tablet Place 0.4 mg under the tongue every 5 (five) minutes as needed for chest pain.  Marland Kitchen omeprazole (PRILOSEC)  20 MG capsule Take 20 mg by mouth daily.  Marland Kitchen spironolactone (ALDACTONE) 25 MG tablet Take 25 mg by mouth daily.  Marland Kitchen terazosin (HYTRIN) 5 MG capsule Take 1 capsule (5 mg total) by mouth 2 (two) times daily.   No facility-administered encounter medications on file as of 04/18/2015.    Activities of Daily Living In your present state of health, do you have any difficulty performing the following activities: 04/18/2015  Hearing? Y  Vision? N  Difficulty concentrating or making decisions? N  Walking or climbing stairs? N  Dressing or bathing? N  Doing errands, shopping? N  Preparing Food and eating ? N  Using the Toilet? N  In the past six months, have you accidently leaked urine? N  Do you have problems with loss of bowel control? N  Managing your Medications? N  Managing your Finances? N  Housekeeping or managing your Housekeeping? N    Patient Care Team: Myrlene Broker, MD as PCP - General (Internal Medicine)   Assessment:    Assessment  The patient requested refill of Terazosin and Dr. Dorise Hiss verbally agreed to re-order x 90 days.  The patient upon leaving, stated that he was dx with Hepatitis C. Stated he was referred to the GI clinic in [place] for Liver Bx. Once there, he was    Patient presents  for yearly preventative medicine examination. Medicare questionnaire screening were completed, i.e. Functional; fall risk; depression, memory loss and hearing all discussed and the patient is followed at the [place]; Addressed safety issues at home and in bathroom;   All immunizations and health maintenance protocols were reviewed with the patient plans to return to the [place] in [place] for flu, prevnar and shingles   Education provided for laboratory screens;  Chol results 2015 good  Medication reconciliation, past medical history, social history, problem list and allergies were reviewed in detail with the patient  Goals were established with regard to assisting the patient to move forward in his life and address boredom and negativity and focusing on strengths;   End of life planning was discussed.   Exercise Activities and Dietary recommendations Current Exercise Habits:: Home exercise routine, Type of exercise: Other - see comments (is up and down and busy) Goals    . 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 Wood Firefighter;  3. Think about initiating;  Motivation is 6 or 7; to get this done;       Fall Risk Fall Risk  04/18/2015  Falls in the past year? No   Depression Screen PHQ 2/9 Scores 04/18/2015  PHQ - 2 Score 0    Cognitive Testing MMSE - Mini Mental State Exam 04/18/2015  Not completed: (No Data)   AD8 Score 0   Immunization History  Administered Date(s) Administered  . Influenza Whole 03/05/2014  . Pneumococcal Polysaccharide-23 03/05/2014  . Tdap 06/21/2008   Screening Tests Health Maintenance  Topic Date Due  . Hepatitis C Screening  Nov 27, 1945  . URINE MICROALBUMIN  11/15/1954  . ZOSTAVAX  11/14/2004  . HEMOGLOBIN A1C  08/20/2014  . INFLUENZA VACCINE  01/20/2015  . FOOT EXAM  02/20/2015  . OPHTHALMOLOGY EXAM  02/20/2015  . PNA vac Low Risk Adult (2 of 2 - PCV13) 03/06/2015  . COLONOSCOPY  06/21/2018  .  TETANUS/TDAP  06/21/2018      Plan:     The patient requested refill of Terazosin and Dr. Dorise Hiss  verbally agreed to re-order x 90 days. (apt planned within 3 months;  Will call to have the patient schedule a fup visit with Dr. Dorise HissKollar to review BPH and discuss Hepatitis C and fup care;   Will update Prevnar; [place] if he has not had these   Working on stress relief in preparation for smoking cessation and ETOH moderation. 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   During the course of the visit the patient was educated and counseled about the following appropriate screening and preventive services:   Vaccines to include Pneumoccal, Influenza, Hepatitis B, Td, Zostavax, HCV/   Electrocardiogram/ 02/2015  Cardiovascular Disease/ [facility]  Colorectal cancer screening/ deferred   Diabetes screening/ checks at home; Edu to check fbs, 2 h ppg  Prostate Cancer Screening/ deferred to [facility]  Glaucoma screening; eye exam neg; to get new glasses  Nutrition counseling /will focus on low fat at next AWV  Smoking cessation counseling started - Is trying to moderate with smoking 8 cigarettes  per day   Patient Instructions (the written plan) was given to the patient.    Montine CircleHauck,Voris Tigert, RN  04/18/2015

## 2015-04-18 NOTE — Telephone Encounter (Signed)
Agree with plan of care.  thanks

## 2015-04-18 NOTE — Patient Instructions (Addendum)
[name] , Thank you for taking time to come for your Medicare Wellness Visit. I appreciate your ongoing commitment to your health goals. Please review the following plan we discussed and let me know if I can assist you in the future.  (Post visit: Called post visit to confirm reorder of terazosin and to fup with Dr. Dorise Hiss regarding a1c status; To further document in telephone note)  [facility] with [facility] regarding Shingles vaccine Prevnar 13  Flu  Will check on all of these at the [place]   Will get A1c for Dr. Dorise Hiss   May have filled out AD; but given copy     These are the goals we discussed: Goals    . 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 Wood Firefighter;  3. Think about initiating;  Motivation is 6 or 7; to get this done;        This is a list of the screening recommended for you and due dates:  Health Maintenance  Topic Date Due  .  Hepatitis C: One time screening is recommended by Center for Disease Control  (CDC) for  adults born from [birth year] through 1966.   16-Jun-1946  . Urine Protein Check  11/15/1954  . Shingles Vaccine  11/14/2004  . Hemoglobin A1C  08/20/2014  . Flu Shot  01/20/2015  . Complete foot exam   02/20/2015  . Eye exam for diabetics  02/20/2015  . Pneumonia vaccines (2 of 2 - PCV13) 03/06/2015  . Colon Cancer Screening  06/21/2018  . Tetanus Vaccine  06/21/2018     Health Maintenance, Male A healthy lifestyle and preventative care can promote health and wellness.  Maintain regular health, dental, and eye exams.  Eat a healthy diet. Foods like vegetables, fruits, whole grains, low-fat dairy products, and lean protein foods contain the nutrients you need and are low in calories. Decrease your intake of foods high in solid fats, added sugars, and salt. Get information about a proper diet from your health care provider, if necessary.  Regular physical exercise is one of the most important things you can do for  your health. Most adults should get at least 150 minutes of moderate-intensity exercise (any activity that increases your heart rate and causes you to sweat) each week. In addition, most adults need muscle-strengthening exercises on 2 or more days a week.   Maintain a healthy weight. The body mass index (BMI) is a screening tool to identify possible weight problems. It provides an estimate of body fat based on height and weight. Your health care provider can find your BMI and can help you achieve or maintain a healthy weight. For males 20 years and older:  A BMI below 18.5 is considered underweight.  A BMI of 18.5 to 24.9 is normal.  A BMI of 25 to 29.9 is considered overweight.  A BMI of 30 and above is considered obese.  Maintain normal blood lipids and cholesterol by exercising and minimizing your intake of saturated fat. Eat a balanced diet with plenty of fruits and vegetables. Blood tests for lipids and cholesterol should begin at age 67 and be repeated every 5 years. If your lipid or cholesterol levels are high, you are over age 75, or you are at high risk for heart disease, you may need your cholesterol levels checked more frequently.Ongoing high lipid and cholesterol levels should be treated with medicines if diet and exercise are  not working.  If you smoke, find out from your health care provider how to quit. If you do not use tobacco, do not start.  Lung cancer screening is recommended for adults aged 55-80 years who are at high risk for developing lung cancer because of a history of smoking. A yearly low-dose CT scan of the lungs is recommended for people who have at least a 30-pack-year history of smoking and are current smokers or have quit within the past 15 years. A pack year of smoking is smoking an average of 1 pack of cigarettes a day for 1 year (for example, a 30-pack-year history of smoking could mean smoking 1 pack a day for 30 years or 2 packs a day for 15 years). Yearly  screening should continue until the smoker has stopped smoking for at least 15 years. Yearly screening should be stopped for people who develop a health problem that would prevent them from having lung cancer treatment.  If you choose to drink alcohol, do not have more than 2 drinks per day. One drink is considered to be 12 oz (360 mL) of beer, 5 oz (150 mL) of wine, or 1.5 oz (45 mL) of liquor.  Avoid the use of street drugs. Do not share needles with anyone. Ask for help if you need support or instructions about stopping the use of drugs.  High blood pressure causes heart disease and increases the risk of stroke. High blood pressure is more likely to develop in:  People who have blood pressure in the end of the normal range (100-139/85-89 mm Hg).  People who are overweight or obese.  People who are African American.  If you are 7618-70 years of age, have your blood pressure checked every 3-5 years. If you are 70 years of age or older, have your blood pressure checked every year. You should have your blood pressure measured twice--once when you are at a hospital or clinic, and once when you are not at a hospital or clinic. Record the average of the two measurements. To check your blood pressure when you are not at a hospital or clinic, you can use:  An automated blood pressure machine at a pharmacy.  A home blood pressure monitor.  If you are 945-269 years old, ask your health care provider if you should take aspirin to prevent heart disease.  Diabetes screening involves taking a blood sample to check your fasting blood sugar level. This should be done once every 3 years after age 70 if you are at a normal weight and without risk factors for diabetes. Testing should be considered at a younger age or be carried out more frequently if you are overweight and have at least 1 risk factor for diabetes.  Colorectal cancer can be detected and often prevented. Most routine colorectal cancer screening  begins at the age of 70 and continues through age 70. However, your health care provider may recommend screening at an earlier age if you have risk factors for colon cancer. On a yearly basis, your health care provider may provide home test kits to check for hidden blood in the stool. A small camera at the end of a tube may be used to directly examine the colon (sigmoidoscopy or colonoscopy) to detect the earliest forms of colorectal cancer. Talk to your health care provider about this at age 70 when routine screening begins. A direct exam of the colon should be repeated every 5-10 years through age 70, unless early forms of precancerous  polyps or small growths are found.  People who are at an increased risk for hepatitis B should be screened for this virus. You are considered at high risk for hepatitis B if:  You were born in a country where hepatitis B occurs often. Talk with your health care provider about which countries are considered high risk.  Your parents were born in a high-risk country and you have not received a shot to protect against hepatitis B (hepatitis B vaccine).  You have HIV or AIDS.  You use needles to inject street drugs.  You live with, or have sex with, someone who has hepatitis B.  You are a man who has sex with other men (MSM).  You get hemodialysis treatment.  You take certain medicines for conditions like cancer, organ transplantation, and autoimmune conditions.  Hepatitis C blood testing is recommended for all people born from [birth year] through 1965 and any individual with known risk factors for hepatitis C.  Healthy men should no longer receive prostate-specific antigen (PSA) blood tests as part of routine cancer screening. Talk to your health care provider about prostate cancer screening.  Testicular cancer screening is not recommended for adolescents or adult males who have no symptoms. Screening includes self-exam, a health care provider exam, and other screening  tests. Consult with your health care provider about any symptoms you have or any concerns you have about testicular cancer.  Practice safe sex. Use condoms and avoid high-risk sexual practices to reduce the spread of sexually transmitted infections (STIs).  You should be screened for STIs, including gonorrhea and chlamydia if:  You are sexually active and are younger than 24 years.  You are older than 24 years, and your health care provider tells you that you are at risk for this type of infection.  Your sexual activity has changed since you were last screened, and you are at an increased risk for chlamydia or gonorrhea. Ask your health care provider if you are at risk.  If you are at risk of being infected with HIV, it is recommended that you take a prescription medicine daily to prevent HIV infection. This is called pre-exposure prophylaxis (PrEP). You are considered at risk if:  You are a man who has sex with other men (MSM).  You are a heterosexual man who is sexually active with multiple partners.  You take drugs by injection.  You are sexually active with a partner who has HIV.  Talk with your health care provider about whether you are at high risk of being infected with HIV. If you choose to begin PrEP, you should first be tested for HIV. You should then be tested every 3 months for as long as you are taking PrEP.  Use sunscreen. Apply sunscreen liberally and repeatedly throughout the day. You should seek shade when your shadow is shorter than you. Protect yourself by wearing long sleeves, pants, a wide-brimmed hat, and sunglasses year round whenever you are outdoors.  Tell your health care provider of new moles or changes in moles, especially if there is a change in shape or color. Also, tell your health care provider if a mole is larger than the size of a pencil eraser.  A one-time screening for abdominal aortic aneurysm (AAA) and surgical repair of large AAAs by ultrasound is  recommended for men aged 65-75 years who are current or former smokers.  Stay current with your vaccines (immunizations).   This information is not intended to replace advice given to you by  your health care provider. Make sure you discuss any questions you have with your health care provider.   Document Released: 12/04/2007 Document Revised: 06/28/2014 Document Reviewed: 11/02/2010 Elsevier Interactive Patient Education [employer].

## 2015-04-18 NOTE — Telephone Encounter (Signed)
Patient called back; Prescription ready and made fup apt with Dr. Dorise HissKollar to fup on BPH and + hep c/ [facility] and then turned him down regarding liver bx (? On ASA) and sent him home but never followed up;  Apt with Dr. Dorise HissKollar scheduled for 11/15 at 1 pm

## 2015-05-06 ENCOUNTER — Encounter: Payer: Self-pay | Admitting: Internal Medicine

## 2015-05-06 ENCOUNTER — Ambulatory Visit (INDEPENDENT_AMBULATORY_CARE_PROVIDER_SITE_OTHER): Payer: Medicare Other | Admitting: Internal Medicine

## 2015-05-06 VITALS — BP 118/72 | HR 86 | Temp 98.1°F | Resp 20 | Ht 75.0 in | Wt 234.2 lb

## 2015-05-06 DIAGNOSIS — Z720 Tobacco use: Secondary | ICD-10-CM | POA: Diagnosis not present

## 2015-05-06 DIAGNOSIS — R252 Cramp and spasm: Secondary | ICD-10-CM | POA: Insufficient documentation

## 2015-05-06 DIAGNOSIS — Z23 Encounter for immunization: Secondary | ICD-10-CM

## 2015-05-06 DIAGNOSIS — Z8619 Personal history of other infectious and parasitic diseases: Secondary | ICD-10-CM | POA: Diagnosis not present

## 2015-05-06 DIAGNOSIS — I255 Ischemic cardiomyopathy: Secondary | ICD-10-CM | POA: Diagnosis not present

## 2015-05-06 DIAGNOSIS — N400 Enlarged prostate without lower urinary tract symptoms: Secondary | ICD-10-CM | POA: Diagnosis not present

## 2015-05-06 MED ORDER — CYCLOBENZAPRINE HCL 5 MG PO TABS: 5.0000 mg | ORAL_TABLET | Freq: Two times a day (BID) | ORAL | Status: DC | PRN

## 2015-05-06 NOTE — Assessment & Plan Note (Signed)
Talked to him about the need to quit and he does not want to do that at this time.

## 2015-05-06 NOTE — Assessment & Plan Note (Signed)
Unclear when this was found out and if still active. He does not want treatment even if needed so will not pursue further workup at this time.

## 2015-05-06 NOTE — Progress Notes (Signed)
   Subjective:    Patient ID: Grant [name], male    DOB: 1946-04-10, 69 y.o.   MRN: [id number]  HPI The patient is a 69 YO man coming in to talk about his prior diagnosis of hepatitis C. He has not mentioned this before. Was diagnosed at the [facility]. No elevation in his liver enzymes that he knows of. Has not undergone treatment. Does not want to get treatment or further evaluation.  Also wants to have new muscle cramps. Bother him at night time. Squeeze his leg. Not with walking or moving. Tries home remedies which do not help much. Was getting hydrocodone from the [facility] for it but ran out.   Review of Systems  Constitutional: Negative.   Respiratory: Negative.   Cardiovascular: Negative.   Gastrointestinal: Negative.   Genitourinary: Positive for frequency. Negative for dysuria, urgency, flank pain, decreased urine volume, discharge, enuresis, difficulty urinating and genital sores.  Musculoskeletal: Positive for myalgias and arthralgias.  Neurological: Negative.       Objective:   Physical Exam  Constitutional: He is oriented to person, place, and time. He appears well-developed and well-nourished.  HENT:  Head: Normocephalic and atraumatic.  Eyes: EOM are normal.  Neck: Normal range of motion.  Cardiovascular: Normal rate and regular rhythm.   Pulmonary/Chest: Effort normal and breath sounds normal. No respiratory distress. He has no wheezes. He has no rales.  Abdominal: Soft. Bowel sounds are normal.  Neurological: He is alert and oriented to person, place, and time. Coordination normal.  Skin: Skin is warm and dry.   Filed Vitals:   05/06/15 1310  BP: 118/72  Pulse: 86  Temp: 98.1 F (36.7 C)  TempSrc: Oral  Resp: 20  Height: 6\' 3"  (1.905 m)  Weight: 234 lb 4 oz (106.255 kg)  SpO2: 98%      Assessment & Plan:  Flu shot given at visit.

## 2015-05-06 NOTE — Patient Instructions (Addendum)
We have sent in the muscle medicine called flexeril. This helps to relax muscles and should help with the cramps.   If you can send [place] a copy of the recent labs from the [place] so we do not have to repeat blood work.   We have given you the flu shot.   Exercising to Stay Healthy Exercising regularly is important. It has many health benefits, such as:  Improving your overall fitness, flexibility, and endurance.  Increasing your bone density.  Helping with weight control.  Decreasing your body fat.  Increasing your muscle strength.  Reducing stress and tension.  Improving your overall health. In order to become healthy and stay healthy, it is recommended that you do moderate-intensity and vigorous-intensity exercise. You can tell that you are exercising at a moderate intensity if you have a higher heart rate and faster breathing, but you are still able to hold a conversation. You can tell that you are exercising at a vigorous intensity if you are breathing much harder and faster and cannot hold a conversation while exercising. HOW OFTEN SHOULD I EXERCISE? Choose an activity that you enjoy and set realistic goals. Your health care provider can help you to make an activity plan that works for you. Exercise regularly as directed by your health care provider. This may include:   Doing resistance training twice each week, such as:  Push-ups.  Sit-ups.  Lifting weights.  Using resistance bands.  Doing a given intensity of exercise for a given amount of time. Choose from these options:  150 minutes of moderate-intensity exercise every week.  75 minutes of vigorous-intensity exercise every week.  A mix of moderate-intensity and vigorous-intensity exercise every week. Children, pregnant women, people who are out of shape, people who are overweight, and older adults may need to consult a health care provider for individual recommendations. If you have any sort of medical condition, be sure  to consult your health care provider before starting a new exercise program.  WHAT ARE SOME EXERCISE IDEAS? Some moderate-intensity exercise ideas include:   Walking at a rate of 1 mile in 15 minutes.  Biking.  Hiking.  Golfing.  Dancing. Some vigorous-intensity exercise ideas include:   Walking at a rate of at least 4.5 miles per hour.  Jogging or running at a rate of 5 miles per hour.  Biking at a rate of at least 10 miles per hour.  Lap swimming.  Roller-skating or in-line skating.  Cross-country skiing.  Vigorous competitive sports, such as football, basketball, and soccer.  Jumping rope.  Aerobic dancing. WHAT ARE SOME EVERYDAY ACTIVITIES THAT CAN HELP ME TO GET EXERCISE?  Yard work, such as:  Child psychotherapist.  Raking and bagging leaves.  Washing and waxing your car.  Pushing a stroller.  Shoveling snow.  Gardening.  Washing windows or floors. HOW CAN I BE MORE ACTIVE IN MY DAY-TO-DAY ACTIVITIES?  Use the stairs instead of the elevator.  Take a walk during your lunch break.  If you drive, park your car farther away from work or school.  If you take public transportation, get off one stop early and walk the rest of the way.  Make all of your phone calls while standing up and walking around.  Get up, stretch, and walk around every 30 minutes throughout the day. WHAT GUIDELINES SHOULD I FOLLOW WHILE EXERCISING?  Do not exercise so much that you hurt yourself, feel dizzy, or get very short of breath.  Consult your health care  provider before starting a new exercise program.  Wear comfortable clothes and shoes with good support.  Drink plenty of water while you exercise to prevent dehydration or heat stroke. Body water is lost during exercise and must be replaced.  Work out until you breathe faster and your heart beats faster.   This information is not intended to replace advice given to you by your health care provider. Make sure you  discuss any questions you have with your health care provider.   Document Released: 07/10/2010 Document Revised: 06/28/2014 Document Reviewed: 11/08/2013 Elsevier Interactive Patient Education [employer]2016 [employer] Inc.

## 2015-05-06 NOTE — Progress Notes (Signed)
Pre visit review using our clinic review tool, if applicable. No additional management support is needed unless otherwise documented below in the visit note. 

## 2015-05-06 NOTE — Assessment & Plan Note (Signed)
Checking labs today and rx for flexeril. Will try muscle relaxer before hydrocodone. Treat labs as appropriate.

## 2015-05-06 NOTE — Assessment & Plan Note (Signed)
Doing well on terazosin and will continue.

## 2015-06-03 ENCOUNTER — Ambulatory Visit (INDEPENDENT_AMBULATORY_CARE_PROVIDER_SITE_OTHER): Payer: Medicare Other | Admitting: *Deleted

## 2015-06-03 DIAGNOSIS — I472 Ventricular tachycardia, unspecified: Secondary | ICD-10-CM

## 2015-06-03 NOTE — Progress Notes (Signed)
Remote ICD transmission.   

## 2015-06-05 ENCOUNTER — Encounter: Payer: Self-pay | Admitting: Internal Medicine

## 2015-06-05 ENCOUNTER — Ambulatory Visit (INDEPENDENT_AMBULATORY_CARE_PROVIDER_SITE_OTHER): Payer: Medicare Other | Admitting: Internal Medicine

## 2015-06-05 VITALS — BP 136/84 | HR 64 | Temp 98.8°F | Resp 12 | Ht 75.0 in | Wt 240.0 lb

## 2015-06-05 DIAGNOSIS — I255 Ischemic cardiomyopathy: Secondary | ICD-10-CM | POA: Diagnosis not present

## 2015-06-05 DIAGNOSIS — Z23 Encounter for immunization: Secondary | ICD-10-CM

## 2015-06-05 DIAGNOSIS — R058 Other specified cough: Secondary | ICD-10-CM

## 2015-06-05 DIAGNOSIS — R05 Cough: Secondary | ICD-10-CM | POA: Diagnosis not present

## 2015-06-05 MED ORDER — FLUTICASONE PROPIONATE 50 MCG/ACT NA SUSP: 2.0000 | Freq: Every day | NASAL | Status: DC

## 2015-06-05 NOTE — Assessment & Plan Note (Signed)
Rx for flonase and advised that his smoking is not helping and he should stop. He is not willing to do that right now.

## 2015-06-05 NOTE — Patient Instructions (Signed)
We have sent in the flonase (fluticasone) nose spray. Use 2 sprays in each nostril once a day. It should work within a week to help dry up the sinuses.   You can try capsaicin cream for the defibrillator spot.

## 2015-06-05 NOTE — Progress Notes (Signed)
Pre visit review using our clinic review tool, if applicable. No additional management support is needed unless otherwise documented below in the visit note. 

## 2015-06-05 NOTE — Progress Notes (Signed)
   Subjective:    Patient ID: Grant Hurtobert Junior [name], male    DOB: [birth year], 69 y.o.   MRN: [id number]  HPI The patient is a 69 YO man coming in for cough, this is a new problem. Going on for several months. Started after an episode of walking pneumonia. [facility] of infection. Still with nose drainage and cough. No SOB, no fevers or chills.   Review of Systems  Constitutional: Negative.   HENT: Positive for congestion, postnasal drip and rhinorrhea. Negative for sinus pressure and sore throat.   Respiratory: Positive for cough. Negative for chest tightness, shortness of breath and wheezing.   Cardiovascular: Negative.  Negative for chest pain, palpitations and leg swelling.  Gastrointestinal: Negative.   Musculoskeletal: Positive for myalgias and arthralgias.  Neurological: Negative.       Objective:   Physical Exam  Constitutional: He is oriented to person, place, and time. He appears well-developed and well-nourished.  HENT:  Head: Normocephalic and atraumatic.  Nasal turbinates red and swollen. Oropharynx with some redness.   Eyes: EOM are normal.  Neck: Normal range of motion.  Cardiovascular: Normal rate and regular rhythm.   Pulmonary/Chest: Effort normal and breath sounds normal. No respiratory distress. He has no wheezes. He has no rales.  Abdominal: Soft. Bowel sounds are normal.  Neurological: He is alert and oriented to person, place, and time. Coordination normal.  Skin: Skin is warm and dry.   Filed Vitals:   06/05/15 1304  BP: 136/84  Pulse: 64  Temp: 98.8 F (37.1 C)  TempSrc: Oral  Resp: 12  Height: 6\' 3"  (1.905 m)  Weight: 240 lb (108.863 kg)  SpO2: 94%      Assessment & Plan:

## 2015-06-06 DIAGNOSIS — Z23 Encounter for immunization: Secondary | ICD-10-CM | POA: Diagnosis not present

## 2015-06-06 NOTE — Addendum Note (Signed)
Addended by: Conception ChancyOBERSON, AMY R on: 06/06/2015 11:46 AM   Modules accepted: Orders

## 2015-07-16 LAB — CBC AND DIFFERENTIAL
HCT: 42 % (ref 41–53)
Hemoglobin: 14.8 g/dL (ref 13.5–17.5)
Neutrophils Absolute: 3 /uL
Platelets: 152 10*3/uL (ref 150–399)
WBC: 6.4 10^3/mL

## 2015-07-16 LAB — HEPATIC FUNCTION PANEL
ALT: 62 U/L — AB (ref 10–40)
AST: 47 U/L — AB (ref 14–40)
Alkaline Phosphatase: 59 U/L (ref 25–125)
Bilirubin, Direct: 0.2 mg/dL (ref 0.01–0.4)
Bilirubin, Total: 0.6 mg/dL

## 2015-07-16 LAB — BASIC METABOLIC PANEL
BUN: 12 mg/dL (ref 4–21)
Creatinine: 1.2 mg/dL (ref 0.6–1.3)
GLUCOSE: 140 mg/dL
Potassium: 3.8 mmol/L (ref 3.4–5.3)
Sodium: 139 mmol/L (ref 137–147)

## 2015-07-16 LAB — PSA: PSA: 0.359

## 2015-07-16 LAB — LIPID PANEL
Cholesterol: 133 mg/dL (ref 0–200)
HDL: 41 mg/dL (ref 35–70)
LDL CALC: 47 mg/dL
TRIGLYCERIDES: 227 mg/dL — AB (ref 40–160)

## 2015-09-01 ENCOUNTER — Ambulatory Visit (INDEPENDENT_AMBULATORY_CARE_PROVIDER_SITE_OTHER): Payer: Medicare Other | Admitting: *Deleted

## 2015-09-01 ENCOUNTER — Telehealth: Payer: Self-pay | Admitting: Cardiology

## 2015-09-01 DIAGNOSIS — I472 Ventricular tachycardia, unspecified: Secondary | ICD-10-CM

## 2015-09-01 DIAGNOSIS — Z95810 Presence of automatic (implantable) cardiac defibrillator: Secondary | ICD-10-CM

## 2015-09-01 NOTE — Progress Notes (Signed)
Remote ICD transmission.   

## 2015-09-01 NOTE — Telephone Encounter (Signed)
Spoke with pt and reminded pt of remote transmission that is due today. Pt verbalized understanding.   

## 2015-09-10 ENCOUNTER — Encounter: Payer: Self-pay | Admitting: Nurse Practitioner

## 2015-09-10 ENCOUNTER — Ambulatory Visit (INDEPENDENT_AMBULATORY_CARE_PROVIDER_SITE_OTHER)
Admission: RE | Admit: 2015-09-10 | Discharge: 2015-09-10 | Disposition: A | Discharge status: Home / Self Care | Payer: Medicare Other | Source: Ambulatory Visit | Attending: Nurse Practitioner | Admitting: Nurse Practitioner

## 2015-09-10 ENCOUNTER — Ambulatory Visit (INDEPENDENT_AMBULATORY_CARE_PROVIDER_SITE_OTHER): Payer: Medicare Other | Admitting: Nurse Practitioner

## 2015-09-10 VITALS — BP 134/92 | HR 51 | Temp 98.3°F | Ht 75.0 in | Wt 234.5 lb

## 2015-09-10 DIAGNOSIS — J069 Acute upper respiratory infection, unspecified: Secondary | ICD-10-CM | POA: Insufficient documentation

## 2015-09-10 DIAGNOSIS — B9789 Other viral agents as the cause of diseases classified elsewhere: Principal | ICD-10-CM

## 2015-09-10 DIAGNOSIS — R0602 Shortness of breath: Secondary | ICD-10-CM | POA: Diagnosis not present

## 2015-09-10 DIAGNOSIS — R05 Cough: Secondary | ICD-10-CM | POA: Diagnosis not present

## 2015-09-10 MED ORDER — GUAIFENESIN-CODEINE 100-10 MG/5ML PO SYRP: 5.0000 mL | ORAL_SOLUTION | Freq: Every day | ORAL | Status: DC

## 2015-09-10 NOTE — Progress Notes (Signed)
Patient ID: Grant [name], male    DOB: [birth year]  Age: 70 y.o. MRN: [id number]  CC: Cough   HPI Grant [name] presents for CC of cough x 2 weeks.   1) Pt reports productive cough with grey-green mucous production Wakes up and coughs the most   Treatment to date: NyQuil, DayQuil, Delsym- helpful somewhat Flonase- used this morning  Albuterol inhaler- day before yesterday   Sick contacts: Church members   History Grant [name] has a past medical history of CAD (coronary artery disease); NSVT (nonsustained ventricular tachycardia) (HCC); AICD (automatic cardioverter/defibrillator) present; DM (diabetes mellitus) (HCC); HLD (hyperlipidemia); Hypothyroidism; and Obstructive sleep apnea.   He has past surgical history that includes Abdominal surgery (1981).   His family history includes CAD in his mother; Cancer - Colon in his brother and father; Hypertension in his mother.He reports that he has been smoking Cigarettes.  He has a 32 pack-year smoking history. His smokeless tobacco use includes Chew. His alcohol and drug histories are not on file.  Outpatient Prescriptions Prior to Visit  Medication Sig Dispense Refill  . albuterol (PROVENTIL HFA;VENTOLIN HFA) 108 (90 BASE) MCG/ACT inhaler Inhale 2 puffs into the lungs every 6 (six) hours as needed for wheezing or shortness of breath.    Marland Kitchen. aspirin 325 MG tablet Take 325 mg by mouth.    Marland Kitchen. atorvastatin (LIPITOR) 40 MG tablet Take 40 mg by mouth daily.     . cyclobenzaprine (FLEXERIL) 5 MG tablet Take 1 tablet (5 mg total) by mouth 2 (two) times daily as needed for muscle spasms. 60 tablet 3  . fluticasone (FLONASE) 50 MCG/ACT nasal spray Place 2 sprays into both nostrils daily. 16 g 2  . LEVOTHYROXINE SODIUM PO Take by mouth daily. Dosage unknown.Grant [name]. Confer with PT..    . losartan (COZAAR) 100 MG tablet Take 100 mg by mouth daily.    . metoprolol (LOPRESSOR) 50 MG tablet Take 50 mg by mouth 2 (two) times daily.    . nitroGLYCERIN  (NITROSTAT) 0.4 MG SL tablet Place 0.4 mg under the tongue every 5 (five) minutes as needed for chest pain.    Marland Kitchen. omeprazole (PRILOSEC) 20 MG capsule Take 20 mg by mouth daily.    Marland Kitchen. spironolactone (ALDACTONE) 25 MG tablet Take 25 mg by mouth daily.    Marland Kitchen. terazosin (HYTRIN) 5 MG capsule Take 1 capsule (5 mg total) by mouth 2 (two) times daily. 180 capsule 3  . furosemide (LASIX) 20 MG tablet Take 20 mg by mouth 2 (two) times daily.     No facility-administered medications prior to visit.    ROS Review of Systems  Constitutional: Positive for fatigue. Negative for fever, chills and diaphoresis.  HENT: Positive for congestion. Negative for postnasal drip and rhinorrhea.   Eyes: Negative for visual disturbance.  Respiratory: Positive for cough. Negative for chest tightness, shortness of breath and wheezing.   Cardiovascular: Negative for chest pain, palpitations and leg swelling.  Gastrointestinal: Negative for nausea, vomiting and diarrhea.  Neurological: Negative for dizziness, light-headedness and headaches.    Objective:  BP 134/92 mmHg  Pulse 51  Temp(Src) 98.3 F (36.8 C) (Oral)  Ht 6\' 3"  (1.905 m)  Wt 234 lb 8 oz (106.369 kg)  BMI 29.31 kg/m2  SpO2 97%  Physical Exam  Constitutional: He is oriented to person, place, and time. He appears well-developed and well-nourished. No distress.  HENT:  Head: Normocephalic and atraumatic.  Right Ear: External ear normal.  Left Ear: External ear normal.  Mouth/Throat: Oropharynx is clear and moist. No oropharyngeal exudate.  TMs clear bilaterally  Eyes: EOM are normal. Pupils are equal, round, and reactive to light. Right eye exhibits no discharge. Left eye exhibits no discharge. No scleral icterus.  Neck: Normal range of motion. Neck supple.  Cardiovascular: Normal rate and regular rhythm.   Pulmonary/Chest: Effort normal. No respiratory distress. He has no wheezes. He has no rales. He exhibits no tenderness.  Scattered ronchi   Lymphadenopathy:    He has no cervical adenopathy.  Neurological: He is alert and oriented to person, place, and time.  Skin: Skin is warm and dry. No rash noted. He is not diaphoretic.  Psychiatric: He has a normal mood and affect. His behavior is normal. Judgment and thought content normal.   Assessment & Plan:   Grant [name] was seen today for cough.  Diagnoses and all orders for this visit:  Viral URI with cough -     DG Chest 2 View; Future  Other orders -     guaiFENesin-codeine (ROBITUSSIN AC) 100-10 MG/5ML syrup; Take 5 mLs by mouth at bedtime.   I have discontinued Grant [name] furosemide. I am also having him start on guaiFENesin-codeine. Additionally, I am having him maintain his atorvastatin, LEVOTHYROXINE SODIUM PO, omeprazole, spironolactone, metoprolol, losartan, nitroGLYCERIN, aspirin, albuterol, terazosin, cyclobenzaprine, fluticasone, escitalopram, prazosin, and HYDROcodone-acetaminophen.  Meds ordered this encounter  Medications  . escitalopram (LEXAPRO) 10 MG tablet    Sig: Take 10 mg by mouth at bedtime.  . prazosin (MINIPRESS) 2 MG capsule    Sig: Take 2 mg by mouth at bedtime.  Marland Kitchen HYDROcodone-acetaminophen (NORCO/VICODIN) 5-325 MG tablet    Sig: Take 1 tablet by mouth daily as needed for moderate pain.  Marland Kitchen guaiFENesin-codeine (ROBITUSSIN AC) 100-10 MG/5ML syrup    Sig: Take 5 mLs by mouth at bedtime.    Dispense:  240 mL    Refill:  0    Order Specific Question:  Supervising Provider    Answer:  Sherlene Shams [[id number]]     Follow-up: Return if symptoms worsen or fail to improve.

## 2015-09-10 NOTE — Progress Notes (Signed)
Pre visit review using our clinic review tool, if applicable. No additional management support is needed unless otherwise documented below in the visit note. 

## 2015-09-10 NOTE — Patient Instructions (Signed)
5 mL (1 teaspoon) of cough syrup. Don't drive or make important decisions while taking this....just sleep and rest.   Please get your chest x-ray downstairs today.   We will call with results.

## 2015-09-10 NOTE — Assessment & Plan Note (Addendum)
New onset Will treat conservatively due to probable viral nature Cheratussin syrup printed, signed, and given to pt to take to pharmacy  Cautioned on drowsy effects and how much to take, CXR downstairs today, FU prn worsening/failure to improve.

## 2015-09-17 ENCOUNTER — Encounter: Payer: Self-pay | Admitting: Internal Medicine

## 2015-11-28 LAB — CUP PACEART REMOTE DEVICE CHECK
Battery Remaining Longevity: 120 mo
Battery Remaining Longevity: 120 mo
Battery Remaining Percentage: 100 %
Brady Statistic RV Percent Paced: 0 %
Date Time Interrogation Session: 20161213150900
HIGH POWER IMPEDANCE MEASURED VALUE: 52 Ohm
HighPow Impedance: 52 Ohm
Implantable Lead Implant Date: 20070423
Implantable Lead Location: 753862
Implantable Lead Location: 753862
Implantable Lead Model: 185
Implantable Lead Serial Number: 23456
Lead Channel Impedance Value: 532 Ohm
Lead Channel Setting Pacing Amplitude: 2.4 V
Lead Channel Setting Pacing Pulse Width: 0.4 ms
Lead Channel Setting Sensing Sensitivity: 0.6 mV
MDC IDC LEAD IMPLANT DT: 20070423
MDC IDC LEAD SERIAL: 23456
MDC IDC MSMT BATTERY REMAINING PERCENTAGE: 100 %
MDC IDC MSMT LEADCHNL RV IMPEDANCE VALUE: 541 Ohm
MDC IDC MSMT LEADCHNL RV PACING THRESHOLD AMPLITUDE: 1.1 V
MDC IDC MSMT LEADCHNL RV PACING THRESHOLD PULSEWIDTH: 0.4 ms
MDC IDC PG SERIAL: 102535
MDC IDC PG SERIAL: 102535
MDC IDC SESS DTM: 20170313163300
MDC IDC SET LEADCHNL RV PACING AMPLITUDE: 2.4 V
MDC IDC SET LEADCHNL RV PACING PULSEWIDTH: 0.4 ms
MDC IDC SET LEADCHNL RV SENSING SENSITIVITY: 0.6 mV
MDC IDC STAT BRADY RV PERCENT PACED: 0 %

## 2015-12-01 ENCOUNTER — Ambulatory Visit (INDEPENDENT_AMBULATORY_CARE_PROVIDER_SITE_OTHER): Payer: Medicare Other | Admitting: *Deleted

## 2015-12-01 DIAGNOSIS — I472 Ventricular tachycardia, unspecified: Secondary | ICD-10-CM

## 2015-12-01 NOTE — Progress Notes (Signed)
Remote ICD transmission.   

## 2015-12-08 LAB — CUP PACEART REMOTE DEVICE CHECK
Battery Remaining Longevity: 114 mo
Battery Remaining Percentage: 100 %
Brady Statistic RV Percent Paced: 0 %
HIGH POWER IMPEDANCE MEASURED VALUE: 51 Ohm
Implantable Lead Implant Date: 20070423
Implantable Lead Location: 753862
Implantable Lead Serial Number: 23456
Lead Channel Pacing Threshold Pulse Width: 0.4 ms
Lead Channel Setting Pacing Amplitude: 2.4 V
Lead Channel Setting Pacing Pulse Width: 0.4 ms
Lead Channel Setting Sensing Sensitivity: 0.6 mV
MDC IDC MSMT LEADCHNL RV IMPEDANCE VALUE: 544 Ohm
MDC IDC MSMT LEADCHNL RV PACING THRESHOLD AMPLITUDE: 1.1 V
MDC IDC PG SERIAL: 102535
MDC IDC SESS DTM: 20170612144700

## 2015-12-11 ENCOUNTER — Encounter: Payer: Self-pay | Admitting: Cardiology

## 2016-01-27 ENCOUNTER — Ambulatory Visit: Payer: Medicare Other | Admitting: Internal Medicine

## 2016-05-28 ENCOUNTER — Other Ambulatory Visit: Payer: Self-pay

## 2016-06-10 ENCOUNTER — Telehealth: Payer: Self-pay | Admitting: Internal Medicine

## 2016-06-10 NOTE — Telephone Encounter (Signed)
Called pt to schedule annual wellness appt. Left msg for pt to call office to schedule appt.

## 2016-06-15 ENCOUNTER — Ambulatory Visit (INDEPENDENT_AMBULATORY_CARE_PROVIDER_SITE_OTHER): Payer: Medicare Other | Admitting: *Deleted

## 2016-06-15 DIAGNOSIS — F17210 Nicotine dependence, cigarettes, uncomplicated: Secondary | ICD-10-CM | POA: Diagnosis not present

## 2016-06-15 DIAGNOSIS — I472 Ventricular tachycardia, unspecified: Secondary | ICD-10-CM

## 2016-06-15 DIAGNOSIS — I4901 Ventricular fibrillation: Secondary | ICD-10-CM | POA: Diagnosis not present

## 2016-06-15 DIAGNOSIS — I4891 Unspecified atrial fibrillation: Secondary | ICD-10-CM | POA: Diagnosis not present

## 2016-06-15 DIAGNOSIS — E039 Hypothyroidism, unspecified: Secondary | ICD-10-CM | POA: Diagnosis not present

## 2016-06-15 DIAGNOSIS — E785 Hyperlipidemia, unspecified: Secondary | ICD-10-CM | POA: Diagnosis not present

## 2016-06-15 DIAGNOSIS — Z7901 Long term (current) use of anticoagulants: Secondary | ICD-10-CM | POA: Diagnosis not present

## 2016-06-15 DIAGNOSIS — I10 Essential (primary) hypertension: Secondary | ICD-10-CM | POA: Diagnosis not present

## 2016-06-15 DIAGNOSIS — Z7982 Long term (current) use of aspirin: Secondary | ICD-10-CM | POA: Diagnosis not present

## 2016-06-15 DIAGNOSIS — E876 Hypokalemia: Secondary | ICD-10-CM | POA: Diagnosis not present

## 2016-06-15 DIAGNOSIS — E119 Type 2 diabetes mellitus without complications: Secondary | ICD-10-CM | POA: Diagnosis not present

## 2016-06-15 DIAGNOSIS — K219 Gastro-esophageal reflux disease without esophagitis: Secondary | ICD-10-CM | POA: Diagnosis not present

## 2016-06-15 DIAGNOSIS — I255 Ischemic cardiomyopathy: Secondary | ICD-10-CM | POA: Diagnosis not present

## 2016-06-15 DIAGNOSIS — Z95810 Presence of automatic (implantable) cardiac defibrillator: Secondary | ICD-10-CM | POA: Diagnosis not present

## 2016-06-15 DIAGNOSIS — Z9889 Other specified postprocedural states: Secondary | ICD-10-CM | POA: Diagnosis not present

## 2016-06-15 DIAGNOSIS — I482 Chronic atrial fibrillation: Secondary | ICD-10-CM | POA: Diagnosis not present

## 2016-06-15 DIAGNOSIS — Z79899 Other long term (current) drug therapy: Secondary | ICD-10-CM | POA: Diagnosis not present

## 2016-06-16 DIAGNOSIS — I361 Nonrheumatic tricuspid (valve) insufficiency: Secondary | ICD-10-CM | POA: Diagnosis not present

## 2016-06-16 DIAGNOSIS — Z95810 Presence of automatic (implantable) cardiac defibrillator: Secondary | ICD-10-CM | POA: Diagnosis not present

## 2016-06-16 DIAGNOSIS — I10 Essential (primary) hypertension: Secondary | ICD-10-CM | POA: Diagnosis not present

## 2016-06-16 DIAGNOSIS — I255 Ischemic cardiomyopathy: Secondary | ICD-10-CM | POA: Diagnosis not present

## 2016-06-16 DIAGNOSIS — I2510 Atherosclerotic heart disease of native coronary artery without angina pectoris: Secondary | ICD-10-CM | POA: Diagnosis not present

## 2016-06-16 DIAGNOSIS — I4901 Ventricular fibrillation: Secondary | ICD-10-CM | POA: Diagnosis not present

## 2016-06-16 DIAGNOSIS — I4891 Unspecified atrial fibrillation: Secondary | ICD-10-CM | POA: Diagnosis not present

## 2016-06-16 DIAGNOSIS — I517 Cardiomegaly: Secondary | ICD-10-CM | POA: Diagnosis not present

## 2016-06-16 DIAGNOSIS — I340 Nonrheumatic mitral (valve) insufficiency: Secondary | ICD-10-CM | POA: Diagnosis not present

## 2016-06-16 DIAGNOSIS — I472 Ventricular tachycardia: Secondary | ICD-10-CM | POA: Diagnosis not present

## 2016-06-16 DIAGNOSIS — E876 Hypokalemia: Secondary | ICD-10-CM | POA: Diagnosis not present

## 2016-06-17 ENCOUNTER — Telehealth: Payer: Self-pay

## 2016-06-17 NOTE — Progress Notes (Signed)
Remote ICD transmission.   

## 2016-06-18 ENCOUNTER — Encounter: Payer: Self-pay | Admitting: Cardiology

## 2016-06-18 LAB — CUP PACEART REMOTE DEVICE CHECK
Battery Remaining Percentage: 100 %
Date Time Interrogation Session: 20171226232300
HighPow Impedance: 52 Ohm
Implantable Lead Serial Number: 23456
Implantable Pulse Generator Implant Date: 20070423
Lead Channel Impedance Value: 543 Ohm
Lead Channel Pacing Threshold Amplitude: 1.1 V
Lead Channel Pacing Threshold Pulse Width: 0.4 ms
Lead Channel Setting Pacing Amplitude: 2.4 V
Lead Channel Setting Sensing Sensitivity: 0.6 mV
MDC IDC LEAD IMPLANT DT: 20070423
MDC IDC LEAD LOCATION: 753862
MDC IDC MSMT BATTERY REMAINING LONGEVITY: 108 mo
MDC IDC SET LEADCHNL RV PACING PULSEWIDTH: 0.4 ms
MDC IDC STAT BRADY RV PERCENT PACED: 0 %
Pulse Gen Serial Number: 102535

## 2016-06-18 NOTE — Telephone Encounter (Signed)
Spoke with patient who reported that he is currently in [place] until January 2nd. Patient reported going to the ED after receiving multiple shocks and that he was started on Amiodarone, Eliquis and Potassium. I instructed him on DMV driving restrictions - patient verbalized understanding. Will message scheduler for [facility] appt on January 3rd.

## 2016-06-23 NOTE — Telephone Encounter (Signed)
Spoke with patient's wife who stated that they were still out of town and were unsure of when they would be back but were hoping by next week. She did not have her calendar with her so I told her I would call her back tomorrow with some possible appointment times. She verbalized understanding.

## 2016-06-24 ENCOUNTER — Telehealth: Payer: Self-pay

## 2016-06-24 NOTE — Telephone Encounter (Signed)
Spoke with patients wife and made an appointment for January 11th at 1:30pm. Patient verbalized understanding and will call if any scheduling changes need to be made.

## 2016-07-01 ENCOUNTER — Ambulatory Visit (INDEPENDENT_AMBULATORY_CARE_PROVIDER_SITE_OTHER): Payer: Medicare Other | Admitting: Internal Medicine

## 2016-07-01 ENCOUNTER — Encounter: Payer: Self-pay | Admitting: Internal Medicine

## 2016-07-01 VITALS — BP 106/72 | HR 62 | Ht 75.0 in | Wt 231.8 lb

## 2016-07-01 DIAGNOSIS — Z95810 Presence of automatic (implantable) cardiac defibrillator: Secondary | ICD-10-CM | POA: Diagnosis not present

## 2016-07-01 DIAGNOSIS — I472 Ventricular tachycardia, unspecified: Secondary | ICD-10-CM

## 2016-07-01 DIAGNOSIS — I255 Ischemic cardiomyopathy: Secondary | ICD-10-CM | POA: Diagnosis not present

## 2016-07-01 DIAGNOSIS — Z79899 Other long term (current) drug therapy: Secondary | ICD-10-CM | POA: Diagnosis not present

## 2016-07-01 DIAGNOSIS — I2589 Other forms of chronic ischemic heart disease: Secondary | ICD-10-CM | POA: Diagnosis not present

## 2016-07-01 NOTE — Patient Instructions (Addendum)
Medication Instructions: - Your physician recommends that you continue on your current medications as directed. Please refer to the Current Medication list given to you today.  Labwork: - Your physician recommends that you have lab work today: TSH/Liver  Procedures/Testing: - none ordered  Follow-Up: - Your physician recommends that you schedule a follow-up appointment in: 6 weeks with Dr. Graciela HusbandsKlein.  Any Additional Special Instructions Will Be Listed Below (If Applicable).     If you need a refill on your cardiac medications before your next appointment, please call your pharmacy.

## 2016-07-01 NOTE — Progress Notes (Signed)
Patient Care Team: Myrlene Broker, MD as PCP - General (Internal Medicine)   HPI  Grant [name] is a 71 y.o. male Seen in follow-up for ICD implanted for ventricular tachycardia in the context of ischemic heart disease prior stenting. He had intervertebral normalization of LV function with an echo EF 2013 55%.  Noted when first evaluated 11/15 to have resting bradycardia  we had anticipated exercise treadmill testing which was never accomplished   Device interrogation prior to that 6/17 had demonstrated only nonsustained ventricular tachycardia January 2018 he was seen in the emergency room in [place] for multiple shocks. Amiodarone was initiated;  Review of the electrograms today suggested this was all atrial fibrillation. It was irregularly irregular. Follow these were predominantly similar to sinus. There was some wide complex beats some of which were long short couple others of which were not followed significant changes and rate.  Better since home    Past Medical History:  Diagnosis Date  . AICD (automatic cardioverter/defibrillator) present   . CAD (coronary artery disease)   . DM (diabetes mellitus) (HCC)   . HLD (hyperlipidemia)   . Hypothyroidism   . NSVT (nonsustained ventricular tachycardia) (HCC)   . Obstructive sleep apnea     Past Surgical History:  Procedure Laterality Date  . ABDOMINAL SURGERY  1981   repair lungs, liver, and intenstines from trauma    Current Outpatient Prescriptions  Medication Sig Dispense Refill  . albuterol (PROVENTIL HFA;VENTOLIN HFA) 108 (90 BASE) MCG/ACT inhaler Inhale 2 puffs into the lungs every 6 (six) hours as needed for wheezing or shortness of breath.    Marland Kitchen amiodarone (PACERONE) 200 MG tablet Take 200 mg by mouth 2 (two) times daily.    Marland Kitchen apixaban (ELIQUIS) 5 MG TABS tablet Take 5 mg by mouth 2 (two) times daily.    Marland Kitchen aspirin 325 MG tablet Take 325 mg by mouth.    Marland Kitchen atorvastatin (LIPITOR) 40 MG tablet Take  40 mg by mouth daily.     . cyclobenzaprine (FLEXERIL) 5 MG tablet Take 1 tablet (5 mg total) by mouth 2 (two) times daily as needed for muscle spasms. 60 tablet 3  . escitalopram (LEXAPRO) 10 MG tablet Take 10 mg by mouth at bedtime.    . fluticasone (FLONASE) 50 MCG/ACT nasal spray Place 2 sprays into both nostrils daily. 16 g 2  . furosemide (LASIX) 20 MG tablet Take 2 tablet by mouth in the AM and 1 tablet by mouth in the PM    . guaiFENesin-codeine (ROBITUSSIN AC) 100-10 MG/5ML syrup Take 5 mLs by mouth at bedtime. 240 mL 0  . HYDROcodone-acetaminophen (NORCO/VICODIN) 5-325 MG tablet Take 1 tablet by mouth daily as needed for moderate pain.    Marland Kitchen LEVOTHYROXINE SODIUM PO Take by mouth daily. Dosage unknown.[name] with PT..    . losartan (COZAAR) 100 MG tablet Take 100 mg by mouth daily.    . metoprolol (LOPRESSOR) 50 MG tablet Take 50 mg by mouth 2 (two) times daily.    . nitroGLYCERIN (NITROSTAT) 0.4 MG SL tablet Place 0.4 mg under the tongue every 5 (five) minutes as needed for chest pain.    Marland Kitchen omeprazole (PRILOSEC) 20 MG capsule Take 20 mg by mouth daily.    . prazosin (MINIPRESS) 2 MG capsule Take 2 mg by mouth at bedtime.    Marland Kitchen spironolactone (ALDACTONE) 25 MG tablet Take 25 mg by mouth daily.    Marland Kitchen terazosin (HYTRIN) 5 MG capsule  Take 1 capsule (5 mg total) by mouth 2 (two) times daily. 180 capsule 3   No current facility-administered medications for this visit.     No Known Allergies    Review of Systems negative except from HPI and PMH  Physical Exam BP 106/72   Pulse 62   Ht 6\' 3"  (1.905 m)   Wt 231 lb 12.8 oz (105.1 kg)   SpO2 98%   BMI 28.97 kg/m  Well developed and well nourished in no acute distress HENT normal E scleral and icterus clear Neck Supple JVP flat; carotids brisk and full Clear to ausculation Device pocket well healed; without hematoma or erythema.  There is no tethering Regular rate and rhythm, no murmurs gallops or rub Soft with active bowel  sounds No clubbing cyanosis  Edema Alert and oriented, grossly normal motor and sensory function Skin Warm and Dry  ECG  Sinus 57 19/09/42  Assessment and  Plan  Ischemic Cardiomyopathy--interval normalization  Ventricular Tachycardia  Atrial fibrillation   ICD  The patient's device was interrogated.  The information was reviewed. No changes were made in the programming.     No intercurrent Ventricular tachycardia  WiThe strips are most consistent with atrial fibrillation. We will wait to see if there information in the paper work that is enlightening  For now will continue amio Check surviellance labs    More than 50% of 45 min was spent in counseling related to the above

## 2016-07-02 ENCOUNTER — Telehealth: Payer: Self-pay | Admitting: Internal Medicine

## 2016-07-02 LAB — HEPATIC FUNCTION PANEL
ALT: 10 IU/L (ref 0–44)
AST: 18 IU/L (ref 0–40)
Albumin: 3.9 g/dL (ref 3.5–4.8)
Alkaline Phosphatase: 55 IU/L (ref 39–117)
Bilirubin Total: 0.3 mg/dL (ref 0.0–1.2)
Bilirubin, Direct: 0.17 mg/dL (ref 0.00–0.40)
Total Protein: 6.5 g/dL (ref 6.0–8.5)

## 2016-07-02 LAB — TSH: TSH: 5.87 u[IU]/mL — AB (ref 0.450–4.500)

## 2016-07-02 NOTE — Telephone Encounter (Signed)
Walk in pt form-records dropped off-taken around to box.

## 2016-07-06 ENCOUNTER — Telehealth: Payer: Self-pay

## 2016-07-06 NOTE — Telephone Encounter (Signed)
Pt is aware of normal hepatic results and elevated TSH. I informed him that these labs were routed to PCP for up titration of synthroid and a recheck of TSH. I told him to give PCP a call on Wednesday if no one from her office had contacted him by then. He is agreeable to change in therapy

## 2016-07-09 LAB — CUP PACEART INCLINIC DEVICE CHECK
Battery Remaining Longevity: 102 mo
Brady Statistic RA Percent Paced: 1 % — CL
Date Time Interrogation Session: 20180119104019
HighPow Impedance: 49 Ohm
Implantable Lead Model: 185
Implantable Pulse Generator Implant Date: 20070423
Lead Channel Pacing Threshold Amplitude: 1.1 V
Lead Channel Pacing Threshold Pulse Width: 0.4 ms
MDC IDC LEAD IMPLANT DT: 20070423
MDC IDC LEAD LOCATION: 753862
MDC IDC LEAD SERIAL: 23456
MDC IDC MSMT LEADCHNL RV IMPEDANCE VALUE: 527 Ohm
MDC IDC MSMT LEADCHNL RV SENSING INTR AMPL: 8.8 mV
MDC IDC PG SERIAL: 102535

## 2016-08-16 ENCOUNTER — Ambulatory Visit (INDEPENDENT_AMBULATORY_CARE_PROVIDER_SITE_OTHER): Payer: Medicare Other | Admitting: Internal Medicine

## 2016-08-16 ENCOUNTER — Encounter (INDEPENDENT_AMBULATORY_CARE_PROVIDER_SITE_OTHER): Payer: Self-pay

## 2016-08-16 ENCOUNTER — Encounter: Payer: Self-pay | Admitting: Internal Medicine

## 2016-08-16 ENCOUNTER — Encounter: Payer: Self-pay | Admitting: *Deleted

## 2016-08-16 VITALS — BP 126/74 | HR 53 | Ht 75.0 in | Wt 235.6 lb

## 2016-08-16 DIAGNOSIS — I472 Ventricular tachycardia, unspecified: Secondary | ICD-10-CM

## 2016-08-16 DIAGNOSIS — Z95810 Presence of automatic (implantable) cardiac defibrillator: Secondary | ICD-10-CM | POA: Diagnosis not present

## 2016-08-16 DIAGNOSIS — I2589 Other forms of chronic ischemic heart disease: Secondary | ICD-10-CM

## 2016-08-16 DIAGNOSIS — I255 Ischemic cardiomyopathy: Secondary | ICD-10-CM | POA: Diagnosis not present

## 2016-08-16 DIAGNOSIS — Z79899 Other long term (current) drug therapy: Secondary | ICD-10-CM | POA: Diagnosis not present

## 2016-08-16 DIAGNOSIS — I480 Paroxysmal atrial fibrillation: Secondary | ICD-10-CM

## 2016-08-16 LAB — CUP PACEART INCLINIC DEVICE CHECK
HighPow Impedance: 41 Ohm
HighPow Impedance: 51 Ohm
Implantable Lead Location: 753860
Implantable Lead Model: 185
Implantable Pulse Generator Implant Date: 20070423
Lead Channel Pacing Threshold Pulse Width: 0.4 ms
Lead Channel Setting Pacing Pulse Width: 0.4 ms
Lead Channel Setting Sensing Sensitivity: 0.6 mV
MDC IDC LEAD IMPLANT DT: 20070423
MDC IDC LEAD SERIAL: 23456
MDC IDC MSMT LEADCHNL RV IMPEDANCE VALUE: 547 Ohm
MDC IDC MSMT LEADCHNL RV PACING THRESHOLD AMPLITUDE: 1.2 V
MDC IDC MSMT LEADCHNL RV SENSING INTR AMPL: 7.7 mV
MDC IDC PG SERIAL: 102535
MDC IDC SESS DTM: 20180226050000
MDC IDC SET LEADCHNL RV PACING AMPLITUDE: 2.4 V

## 2016-08-16 NOTE — Patient Instructions (Signed)
Medication Instructions: - Your physician has recommended you make the following change in your medication: 1) Stop aspirin 2) Decrease metoprolol tartrate 50 mg- take 1/2 tablet (25 mg) twice daily x 1 week, then stop  Labwork: - none ordered  Procedures/Testing: - none ordered  Follow-Up: - Your physician recommends that you schedule a follow-up appointment in: 2 months with Gypsy BalsamAmber Seiler, NP  - Your physician wants you to follow-up in: 1 year with Dr. Graciela HusbandsKlein. You will receive a reminder letter in the mail two months in advance. If you don't receive a letter, please call our office to schedule the follow-up appointment.  Any Additional Special Instructions Will Be Listed Below (If Applicable).     If you need a refill on your cardiac medications before your next appointment, please call your pharmacy.

## 2016-08-16 NOTE — Progress Notes (Signed)
Patient Care Team: Myrlene BrokerElizabeth A Crawford, MD as PCP - General (Internal Medicine)   HPI  Grant [name] is a 71 y.o. male Seen in follow-up for ICD implanted for ventricular tachycardia in the context of ischemic heart disease prior stenting. He had intervertebral normalization of LV function with an echo EF 2013 55%.  Noted when first evaluated 11/15 to have resting bradycardia  we had anticipated exercise treadmill testing which was never accomplished   Device interrogation prior to that 6/17 had demonstrated only nonsustained ventricular tachycardia January 2018 he was seen in [place] emergency [place] in [place] for multiple shocks. Amiodarone was initiated;  Review of the electrograms today suggested this was all atrial fibrillation. It was irregularly irregular. Follow these were predominantly similar to sinus. There was some wide complex beats some of which were long short couple others of which were not followed significant changes and rate. Patient denies symptoms of GI intolerance, sun sensitivity, neurological symptoms attributable to amiodarone.  Surveillance laboratories were in normal limits when checked351months ago   Date TSH LFTs PFTs  12/15 2.34    1/17   62   1/18  5.87 10     He has significant fatigue. He has sleep disordered breathing. He has an abnormal sleep study but was not able to tolerate CPAP although it doesn't sound like he tried very hard.   Past Medical History:  Diagnosis Date  . AICD (automatic cardioverter/defibrillator) present   . CAD (coronary artery disease)   . DM (diabetes mellitus) (HCC)   . HLD (hyperlipidemia)   . Hypothyroidism   . NSVT (nonsustained ventricular tachycardia) (HCC)   . Obstructive sleep apnea     Past Surgical History:  Procedure Laterality Date  . ABDOMINAL SURGERY  1981   repair lungs, liver, and intenstines from trauma    Current Outpatient Prescriptions  Medication Sig Dispense Refill  . albuterol  (PROVENTIL HFA;VENTOLIN HFA) 108 (90 BASE) MCG/ACT inhaler Inhale 2 puffs into the lungs every 6 (six) hours as needed for wheezing or shortness of breath.    Marland Kitchen. amiodarone (PACERONE) 200 MG tablet Take 200 mg by mouth 2 (two) times daily.    Marland Kitchen. apixaban (ELIQUIS) 5 MG TABS tablet Take 5 mg by mouth 2 (two) times daily.    Marland Kitchen. aspirin 81 MG EC tablet Take 81 mg by mouth.     Marland Kitchen. atorvastatin (LIPITOR) 40 MG tablet Take 40 mg by mouth daily.     . cyclobenzaprine (FLEXERIL) 5 MG tablet Take 1 tablet (5 mg total) by mouth 2 (two) times daily as needed for muscle spasms. 60 tablet 3  . escitalopram (LEXAPRO) 10 MG tablet Take 10 mg by mouth at bedtime.    . fluticasone (FLONASE) 50 MCG/ACT nasal spray Place 2 sprays into both nostrils daily. 16 g 2  . furosemide (LASIX) 20 MG tablet Take 2 tablet by mouth in the AM and 1 tablet by mouth in the PM    . HYDROcodone-acetaminophen (NORCO/VICODIN) 5-325 MG tablet Take 1 tablet by mouth daily as needed for moderate pain.    Marland Kitchen. LEVOTHYROXINE SODIUM PO Take by mouth daily. Dosage unknown.[name]. Confer with PT..    . losartan (COZAAR) 100 MG tablet Take 100 mg by mouth daily.    . metoprolol (LOPRESSOR) 50 MG tablet Take 50 mg by mouth 2 (two) times daily.    . nitroGLYCERIN (NITROSTAT) 0.4 MG SL tablet Place 0.4 mg under the tongue every 5 (five) minutes as  needed for chest pain.    Marland Kitchen omeprazole (PRILOSEC) 20 MG capsule Take 20 mg by mouth daily.    . prazosin (MINIPRESS) 2 MG capsule Take 2 mg by mouth at bedtime.    Marland Kitchen spironolactone (ALDACTONE) 25 MG tablet Take 25 mg by mouth daily.    Marland Kitchen terazosin (HYTRIN) 5 MG capsule Take 1 capsule (5 mg total) by mouth 2 (two) times daily. 180 capsule 3   No current facility-administered medications for this visit.     No Known Allergies    Review of Systems negative except from HPI and PMH  Physical Exam BP 126/74   Pulse (!) 53   Ht 6\' 3"  (1.905 m)   Wt 235 lb 9.6 oz (106.9 kg)   SpO2 96%   BMI 29.45 kg/m  Well  developed and well nourished in no acute distress HENT normal E scleral and icterus clear Neck Supple JVP flat; carotids brisk and full Clear to ausculation Device pocket well healed; without hematoma or erythema.  There is no tethering Regular rate and rhythm, no murmurs gallops or rub Soft with active bowel sounds No clubbing cyanosis  Edema Alert and oriented, grossly normal motor and sensory function Skin Warm and Dry  ECG  Sinus 53 20/10/48  Assessment and  Plan  Ischemic Cardiomyopathy--interval normalization  Ventricular Tachycardia  Atrial fibrillation   Sleep disordered breathing  ICD [employer]    The patient's device was interrogated.  The information was reviewed. No changes were made in the programming.    Sinus Bradycardia  Elevated TSH new   Continues with intercurrent nonsustained ventricular tachycardia is no complaint: Ounces resected as far as unit is usual to be written in 1930 and is recommending this woman and there is an American diabetes and his his chest that her recurrent dysplasia or  Will stop his betablocker and his ASA  This may improve fatgiue which may be secondary to bradycardia. Last month there is a modest new elevation in TSH which may be related to his amiodarone. We will plan to see him again in 2 months time for reassessment of his amiodarone surveillance laboratories and heart rate excursion with walking  Encouraged him to follow-up on his sleep apnea therapy  On Anticoagulation;  No bleeding issues   Without symptoms of ischemia  Amiodarone

## 2016-09-06 ENCOUNTER — Telehealth: Payer: Self-pay | Admitting: Internal Medicine

## 2016-09-06 NOTE — Telephone Encounter (Signed)
Called patient to schedule awv. Lvm for patient to call office to schedule appt.  °

## 2016-10-18 NOTE — Progress Notes (Signed)
Electrophysiology Office Note Date: 10/19/2016  ID:  Grant [name], Grant [name] [birth year], MRN [id number]  PCP: Myrlene Broker, MD Electrophysiologist: Graciela Husbands  CC: Routine ICD and bradycardia follow-up  Grant [name] is a 71 y.o. male seen today for Dr Graciela Husbands.  He presents today for routine electrophysiology followup.  At last office visit with Dr Graciela Husbands, he was noted to be bradycardic and BB was supposed to be discontinued.  He did not stop Metoprolol, however and has continued to have shortness of breath, fatigue, and LE edema. He is eating a large amount of sodium daily.  He also has diagnosed sleep apnea but is not wearing CPAP.   He denies chest pain, palpitations, PND, orthopnea, nausea, vomiting, dizziness, syncope,  weight gain, or early satiety.  He has not had ICD shocks.   Device History: BSX dual chamber ICD implanted 2007 for VT; gen change 2013 with RA lead capped at that time (all procedures done at outside hospital) History of appropriate therapy: Yes History of AAD therapy: Yes - amiodarone; also inappropriate therapy for AF with RVR   Past Medical History:  Diagnosis Date  . AICD (automatic cardioverter/defibrillator) present   . CAD (coronary artery disease)   . DM (diabetes mellitus) (HCC)   . HLD (hyperlipidemia)   . Hypothyroidism   . NSVT (nonsustained ventricular tachycardia) (HCC)   . Obstructive sleep apnea    Past Surgical History:  Procedure Laterality Date  . ABDOMINAL SURGERY  1981   repair lungs, liver, and intenstines from trauma    Current Outpatient Prescriptions  Medication Sig Dispense Refill  . albuterol (PROVENTIL HFA;VENTOLIN HFA) 108 (90 BASE) MCG/ACT inhaler Inhale 2 puffs into the lungs every 6 (six) hours as needed for wheezing or shortness of breath.    Marland Kitchen amiodarone (PACERONE) 200 MG tablet Take 200 mg by mouth 2 (two) times daily.    Marland Kitchen apixaban (ELIQUIS) 5 MG TABS tablet Take 5 mg by mouth 2 (two) times daily.    Marland Kitchen  atorvastatin (LIPITOR) 40 MG tablet Take 40 mg by mouth daily.     . cyclobenzaprine (FLEXERIL) 5 MG tablet Take 1 tablet (5 mg total) by mouth 2 (two) times daily as needed for muscle spasms. 60 tablet 3  . escitalopram (LEXAPRO) 10 MG tablet Take 10 mg by mouth at bedtime.    . fluticasone (FLONASE) 50 MCG/ACT nasal spray Place 2 sprays into both nostrils daily. 16 g 2  . furosemide (LASIX) 20 MG tablet Take 2 tablet by mouth in the AM and 1 tablet by mouth in the PM    . HYDROcodone-acetaminophen (NORCO/VICODIN) 5-325 MG tablet Take 1 tablet by mouth daily as needed for moderate pain.    Marland Kitchen LEVOTHYROXINE SODIUM PO Take by mouth daily. Dosage unknown.[name] with PT..    . losartan (COZAAR) 100 MG tablet Take 100 mg by mouth daily.    . nitroGLYCERIN (NITROSTAT) 0.4 MG SL tablet Place 0.4 mg under the tongue every 5 (five) minutes as needed for chest pain.    Marland Kitchen omeprazole (PRILOSEC) 20 MG capsule Take 20 mg by mouth daily.    . prazosin (MINIPRESS) 2 MG capsule Take 2 mg by mouth at bedtime.    Marland Kitchen spironolactone (ALDACTONE) 25 MG tablet Take 25 mg by mouth daily.    Marland Kitchen terazosin (HYTRIN) 5 MG capsule Take 1 capsule (5 mg total) by mouth 2 (two) times daily. 180 capsule 3   No current facility-administered medications for this visit.  Allergies:   Patient has no known allergies.   Social History: Social History   Social History  . Marital status: Married    Spouse name: N/A  . Number of children: N/A  . Years of education: N/A   Occupational History  . Not on file.   Social History Main Topics  . Smoking status: Current Every Day Smoker    Packs/day: 0.80    Years: 40.00    Types: Cigarettes  . Smokeless tobacco: Current User    Types: Chew     Comment: Quit x 6 months; with patches and pills   . Alcohol use Not on file  . Drug use: Unknown  . Sexual activity: Not on file   Other Topics Concern  . Not on file   Social History Narrative  . No narrative on file     Family History: Family History  Problem Relation Age of Onset  . CAD Mother   . Hypertension Mother   . Cancer - Colon Father   . Cancer - Colon Brother     Review of Systems: All other systems reviewed and are otherwise negative except as noted above.   Physical Exam: VS:  BP 106/60   Pulse 61   Ht  (1.88 m)   Wt 233 lb (105.7 kg)   SpO2 96%   BMI 29.92 kg/m  , BMI Body mass index is 29.92 kg/m.  GEN- The patient is obese appearing, alert and oriented x 3 today.   HEENT: normocephalic, atraumatic; sclera clear, conjunctiva pink; hearing intact; oropharynx clear; neck supple  Lungs- Clear to ausculation bilaterally, normal work of breathing.  No wheezes, rales, rhonchi Heart- Regular rate and rhythm  GI- soft, non-tender, non-distended, bowel sounds present  Extremities- no clubbing, cyanosis, trace BLE edema MS- no significant deformity or atrophy Skin- warm and dry, no rash or lesion; ICD pocket well healed Psych- euthymic mood, full affect Neuro- strength and sensation are intact  ICD interrogation- reviewed in detail today,  See PACEART report  EKG:  EKG is not ordered today.  Recent Labs: 07/01/2016: ALT 10; TSH 5.870   Wt Readings from Last 3 Encounters:  10/19/16 233 lb (105.7 kg)  08/16/16 235 lb 9.6 oz (106.9 kg)  07/01/16 231 lb 12.8 oz (105.1 kg)     Other studies Reviewed: Additional studies/ records that were reviewed today include: Dr Odessa Fleming office notes  Assessment and Plan:  1.  Chronic systolic dysfunction Slightly volume overloaded on exam Stable on an appropriate medical regimen Normal ICD function See Pace Art report No changes today Reinforced need for low Na++ diet  2.  Sinus bradycardia Stop BB today (not done at last office visit) He has an atrial lead that was capped for unknown reasons at time of gen change 2013 at outside hospital  3.  Paroxysmal atrial fibrillation Burden by symptoms low Continue  amiodarone LFT's, TSH, FT4 today. Annual eye exams recommended  Continue Eliquis for CHADS2VASC of 4  4.  OSA Not compliant with CPAP We discussed importance of treatment today  5.  Ventricular tachycardia No recent recurrence Continue current therapy   Current medicines are reviewed at length with the patient today.   The patient does not have concerns regarding his medicines.  The following changes were made today:  none  Labs/ tests ordered today include: TSH, FT4, LFT Orders Placed This Encounter  Procedures  . CBC  . TSH  . T4, free  . Hepatic function panel  .  CUP PACEART INCLINIC DEVICE CHECK     Disposition:   Follow up with [facility], 3 months Dr Graciela Husbands    Signed, Gypsy Balsam, NP 10/19/2016 11:23 AM  [facility] [address] [place] [address] [phone] (office) [phone] (fax)

## 2016-10-19 ENCOUNTER — Encounter: Payer: Self-pay | Admitting: Nurse Practitioner

## 2016-10-19 ENCOUNTER — Ambulatory Visit (INDEPENDENT_AMBULATORY_CARE_PROVIDER_SITE_OTHER): Payer: Medicare Other | Admitting: Nurse Practitioner

## 2016-10-19 ENCOUNTER — Encounter (INDEPENDENT_AMBULATORY_CARE_PROVIDER_SITE_OTHER): Payer: Self-pay

## 2016-10-19 VITALS — BP 106/60 | HR 61 | Ht 74.0 in | Wt 233.0 lb

## 2016-10-19 DIAGNOSIS — I255 Ischemic cardiomyopathy: Secondary | ICD-10-CM | POA: Diagnosis not present

## 2016-10-19 DIAGNOSIS — G4733 Obstructive sleep apnea (adult) (pediatric): Secondary | ICD-10-CM | POA: Diagnosis not present

## 2016-10-19 DIAGNOSIS — I480 Paroxysmal atrial fibrillation: Secondary | ICD-10-CM | POA: Diagnosis not present

## 2016-10-19 DIAGNOSIS — R001 Bradycardia, unspecified: Secondary | ICD-10-CM

## 2016-10-19 DIAGNOSIS — I472 Ventricular tachycardia, unspecified: Secondary | ICD-10-CM

## 2016-10-19 DIAGNOSIS — I5022 Chronic systolic (congestive) heart failure: Secondary | ICD-10-CM

## 2016-10-19 LAB — CUP PACEART INCLINIC DEVICE CHECK
Implantable Pulse Generator Implant Date: 20070423
MDC IDC LEAD IMPLANT DT: 20070423
MDC IDC LEAD LOCATION: 753860
MDC IDC LEAD SERIAL: 23456
MDC IDC SESS DTM: 20180501111805
Pulse Gen Serial Number: 102535

## 2016-10-19 LAB — HEPATIC FUNCTION PANEL
ALT: 14 IU/L (ref 0–44)
AST: 19 IU/L (ref 0–40)
Albumin: 4.6 g/dL (ref 3.5–4.8)
Alkaline Phosphatase: 38 IU/L — ABNORMAL LOW (ref 39–117)
BILIRUBIN TOTAL: 0.7 mg/dL (ref 0.0–1.2)
BILIRUBIN, DIRECT: 0.18 mg/dL (ref 0.00–0.40)
TOTAL PROTEIN: 7 g/dL (ref 6.0–8.5)

## 2016-10-19 LAB — CBC
HEMATOCRIT: 40.4 % (ref 37.5–51.0)
HEMOGLOBIN: 13.6 g/dL (ref 13.0–17.7)
MCH: 30.6 pg (ref 26.6–33.0)
MCHC: 33.7 g/dL (ref 31.5–35.7)
MCV: 91 fL (ref 79–97)
Platelets: 157 10*3/uL (ref 150–379)
RBC: 4.45 x10E6/uL (ref 4.14–5.80)
RDW: 13.4 % (ref 12.3–15.4)
WBC: 8.2 10*3/uL (ref 3.4–10.8)

## 2016-10-19 LAB — T4, FREE: FREE T4: 1.23 ng/dL (ref 0.82–1.77)

## 2016-10-19 LAB — TSH: TSH: 7.02 u[IU]/mL — ABNORMAL HIGH (ref 0.450–4.500)

## 2016-10-19 NOTE — Patient Instructions (Signed)
Medication Instructions:  Your physician has recommended you make the following change in your medication: STOP METOPROLOL  Labwork: Your physician recommends that you return for lab work today for CBC, TSH, FREE T4, LFT  Testing/Procedures: None Ordered   Follow-Up: Your physician recommends that you schedule a follow-up appointment in: 3 months with Dr. Graciela Husbands  Any Other Special Instructions Will Be Listed Below (If Applicable).   DASH Eating Plan DASH stands for "Dietary Approaches to Stop Hypertension." The DASH eating plan is a healthy eating plan that has been shown to reduce high blood pressure (hypertension). It may also reduce your risk for type 2 diabetes, heart disease, and stroke. The DASH eating plan may also help with weight loss. What are tips for following this plan? General guidelines   Avoid eating more than 2,300 mg (milligrams) of salt (sodium) a day. If you have hypertension, you may need to reduce your sodium intake to 1,500 mg a day.  Limit alcohol intake to no more than 1 drink a day for nonpregnant women and 2 drinks a day for men. One drink equals 12 oz of beer, 5 oz of wine, or 1 oz of hard liquor.  Work with your health care provider to maintain a healthy body weight or to lose weight. Ask what an ideal weight is for you.  Get at least 30 minutes of exercise that causes your heart to beat faster (aerobic exercise) most days of the week. Activities may include walking, swimming, or biking.  Work with your health care provider or diet and nutrition specialist (dietitian) to adjust your eating plan to your individual calorie needs. Reading food labels   Check food labels for the amount of sodium per serving. Choose foods with less than 5 percent of the Daily Value of sodium. Generally, foods with less than 300 mg of sodium per serving fit into this eating plan.  To find whole grains, look for the word "whole" as the first word in the ingredient  list. Shopping   Buy products labeled as "low-sodium" or "no salt added."  Buy fresh foods. Avoid canned foods and premade or frozen meals. Cooking   Avoid adding salt when cooking. Use salt-free seasonings or herbs instead of table salt or sea salt. Check with your health care provider or pharmacist before using salt substitutes.  Do not fry foods. Cook foods using healthy methods such as baking, boiling, grilling, and broiling instead.  Cook with heart-healthy oils, such as olive, canola, soybean, or sunflower oil. Meal planning    Eat a balanced diet that includes:  5 or more servings of fruits and vegetables each day. At each meal, try to fill half of your plate with fruits and vegetables.  Up to 6-8 servings of whole grains each day.  Less than 6 oz of lean meat, poultry, or fish each day. A 3-oz serving of meat is about the same size as a deck of cards. One egg equals 1 oz.  2 servings of low-fat dairy each day.  A serving of nuts, seeds, or beans 5 times each week.  Heart-healthy fats. Healthy fats called Omega-3 fatty acids are found in foods such as flaxseeds and coldwater fish, like sardines, salmon, and mackerel.  Limit how much you eat of the following:  Canned or prepackaged foods.  Food that is high in trans fat, such as fried foods.  Food that is high in saturated fat, such as fatty meat.  Sweets, desserts, sugary drinks, and other foods with  added sugar.  Full-fat dairy products.  Do not salt foods before eating.  Try to eat at least 2 vegetarian meals each week.  Eat more home-cooked food and less restaurant, buffet, and fast food.  When eating at a restaurant, ask that your food be prepared with less salt or no salt, if possible. What foods are recommended? The items listed may not be a complete list. Talk with your dietitian about what dietary choices are best for you. Grains  Whole-grain or whole-wheat bread. Whole-grain or whole-wheat pasta.  Brown rice. Orpah Cobb. Bulgur. Whole-grain and low-sodium cereals. Pita bread. Low-fat, low-sodium crackers. Whole-wheat flour tortillas. Vegetables  Fresh or frozen vegetables (raw, steamed, roasted, or grilled). Low-sodium or reduced-sodium tomato and vegetable juice. Low-sodium or reduced-sodium tomato sauce and tomato paste. Low-sodium or reduced-sodium canned vegetables. Fruits  All fresh, dried, or frozen fruit. Canned fruit in natural juice (without added sugar). Meat and other protein foods  Skinless chicken or [place]. Ground chicken or [place]. Pork with fat trimmed off. Fish and seafood. Egg whites. Dried beans, peas, or lentils. Unsalted nuts, nut butters, and seeds. Unsalted canned beans. Lean cuts of beef with fat trimmed off. Low-sodium, lean deli meat. Dairy  Low-fat (1%) or fat-free (skim) milk. Fat-free, low-fat, or reduced-fat cheeses. Nonfat, low-sodium ricotta or cottage cheese. Low-fat or nonfat yogurt. Low-fat, low-sodium cheese. Fats and oils  Soft margarine without trans fats. Vegetable oil. Low-fat, reduced-fat, or light mayonnaise and salad dressings (reduced-sodium). Canola, safflower, olive, soybean, and sunflower oils. Avocado. Seasoning and other foods  Herbs. Spices. Seasoning mixes without salt. Unsalted popcorn and pretzels. Fat-free sweets. What foods are not recommended? The items listed may not be a complete list. Talk with your dietitian about what dietary choices are best for you. Grains  Baked goods made with fat, such as croissants, muffins, or some breads. Dry pasta or rice meal packs. Vegetables  Creamed or fried vegetables. Vegetables in a cheese sauce. Regular canned vegetables (not low-sodium or reduced-sodium). Regular canned tomato sauce and paste (not low-sodium or reduced-sodium). Regular tomato and vegetable juice (not low-sodium or reduced-sodium). Rosita Fire. Olives. Fruits  Canned fruit in a light or heavy syrup. Fried fruit. Fruit in cream  or butter sauce. Meat and other protein foods  Fatty cuts of meat. Ribs. Fried meat. Tomasa Blase. Sausage. Bologna and other processed lunch meats. Salami. Fatback. Hotdogs. Bratwurst. Salted nuts and seeds. Canned beans with added salt. Canned or smoked fish. Whole eggs or egg yolks. Chicken or [place] with skin. Dairy  Whole or 2% milk, cream, and half-and-half. Whole or full-fat cream cheese. Whole-fat or sweetened yogurt. Full-fat cheese. Nondairy creamers. Whipped toppings. Processed cheese and cheese spreads. Fats and oils  Butter. Stick margarine. Lard. Shortening. Ghee. Bacon fat. Tropical oils, such as coconut, palm kernel, or palm oil. Seasoning and other foods  Salted popcorn and pretzels. Onion salt, garlic salt, seasoned salt, table salt, and sea salt. Worcestershire sauce. Tartar sauce. Barbecue sauce. Teriyaki sauce. Soy sauce, including reduced-sodium. Steak sauce. Canned and packaged gravies. Fish sauce. Oyster sauce. Cocktail sauce. Horseradish that you find on the shelf. Ketchup. Mustard. Meat flavorings and tenderizers. Bouillon cubes. Hot sauce and Tabasco sauce. Premade or packaged marinades. Premade or packaged taco seasonings. Relishes. Regular salad dressings. Where to find more information:  National Heart, Lung, and Blood Institute: PopSteam.is  American Heart Association: www.heart.org Summary  The DASH eating plan is a healthy eating plan that has been shown to reduce high blood pressure (hypertension). It may also reduce  your risk for type 2 diabetes, heart disease, and stroke.  With the DASH eating plan, you should limit salt (sodium) intake to 2,300 mg a day. If you have hypertension, you may need to reduce your sodium intake to 1,500 mg a day.  When on the DASH eating plan, aim to eat more fresh fruits and vegetables, whole grains, lean proteins, low-fat dairy, and heart-healthy fats.  Work with your health care provider or diet and nutrition specialist  (dietitian) to adjust your eating plan to your individual calorie needs. This information is not intended to replace advice given to you by your health care provider. Make sure you discuss any questions you have with your health care provider. Document Released: 05/27/2011 Document Revised: 05/31/2016 Document Reviewed: 05/31/2016 Elsevier Interactive Patient Education  2017 [employer].    If you need a refill on your cardiac medications before your next appointment, please call your pharmacy.

## 2016-10-22 ENCOUNTER — Encounter: Payer: Medicare Other | Admitting: Nurse Practitioner

## 2016-11-16 ENCOUNTER — Ambulatory Visit (INDEPENDENT_AMBULATORY_CARE_PROVIDER_SITE_OTHER): Payer: Medicare Other | Admitting: *Deleted

## 2016-11-16 DIAGNOSIS — I472 Ventricular tachycardia, unspecified: Secondary | ICD-10-CM

## 2016-11-16 NOTE — Progress Notes (Signed)
Remote ICD transmission.   

## 2016-11-17 LAB — CUP PACEART REMOTE DEVICE CHECK
Brady Statistic RV Percent Paced: 0 %
Date Time Interrogation Session: 20180530102753
HighPow Impedance: 53 Ohm
Implantable Lead Implant Date: 20070423
Implantable Lead Model: 185
Implantable Lead Serial Number: 23456
Lead Channel Impedance Value: 547 Ohm
Lead Channel Sensing Intrinsic Amplitude: 7 mV
Lead Channel Setting Sensing Sensitivity: 0.6 mV
MDC IDC LEAD LOCATION: 753860
MDC IDC PG IMPLANT DT: 20070423
MDC IDC PG SERIAL: 102535
MDC IDC SET LEADCHNL RV PACING AMPLITUDE: 2.4 V
MDC IDC SET LEADCHNL RV PACING PULSEWIDTH: 0.4 ms

## 2016-11-23 ENCOUNTER — Encounter: Payer: Self-pay | Admitting: Cardiology

## 2017-01-19 ENCOUNTER — Ambulatory Visit (INDEPENDENT_AMBULATORY_CARE_PROVIDER_SITE_OTHER): Payer: Medicare Other | Admitting: Internal Medicine

## 2017-01-19 VITALS — BP 120/84 | HR 85 | Ht 74.0 in | Wt 241.0 lb

## 2017-01-19 DIAGNOSIS — I472 Ventricular tachycardia, unspecified: Secondary | ICD-10-CM

## 2017-01-19 DIAGNOSIS — Z95810 Presence of automatic (implantable) cardiac defibrillator: Secondary | ICD-10-CM

## 2017-01-19 DIAGNOSIS — I255 Ischemic cardiomyopathy: Secondary | ICD-10-CM | POA: Diagnosis not present

## 2017-01-19 DIAGNOSIS — I2589 Other forms of chronic ischemic heart disease: Secondary | ICD-10-CM

## 2017-01-19 DIAGNOSIS — I480 Paroxysmal atrial fibrillation: Secondary | ICD-10-CM | POA: Diagnosis not present

## 2017-01-19 DIAGNOSIS — Z79899 Other long term (current) drug therapy: Secondary | ICD-10-CM

## 2017-01-19 LAB — CUP PACEART INCLINIC DEVICE CHECK
HighPow Impedance: 41 Ohm
HighPow Impedance: 53 Ohm
Implantable Lead Location: 753860
Implantable Lead Model: 185
Implantable Lead Serial Number: 23456
Implantable Pulse Generator Implant Date: 20070423
Lead Channel Pacing Threshold Amplitude: 1.2 V
Lead Channel Pacing Threshold Pulse Width: 0.4 ms
Lead Channel Setting Pacing Amplitude: 2.4 V
Lead Channel Setting Pacing Pulse Width: 0.4 ms
Lead Channel Setting Sensing Sensitivity: 0.6 mV
MDC IDC LEAD IMPLANT DT: 20070423
MDC IDC MSMT LEADCHNL RV IMPEDANCE VALUE: 588 Ohm
MDC IDC MSMT LEADCHNL RV SENSING INTR AMPL: 7.4 mV
MDC IDC PG SERIAL: 102535
MDC IDC SESS DTM: 20180801040000

## 2017-01-19 NOTE — Patient Instructions (Signed)
Medication Instructions: - Your physician has recommended you make the following change in your medication:  1) Decrease amiodarone 200 mg- take 1 tablet by mouth once daily 2) Decrease losartan 100 mg- take 1/2 tablet (50 mg) by mouth once daily  Labwork: - Your physician recommends that you return for lab work: BMP/ TSH  Procedures/Testing: - none ordered  Follow-Up: - Remote monitoring is used to monitor your Pacemaker of ICD from home. This monitoring reduces the number of office visits required to check your device to one time per year. It allows us to keep an eye on the functioning of your device to ensure it is working properly. You are scheduled for a device check from home on 02/15/17. You may send your transmission at any time that day. If you have a wireless device, the transmission will be sent automatically. After your physician reviews your transmission, you will receive a postcard with your next transmission date.  - Your physician wants you to follow-up in: 6 months with Gypsy BalsamAmber Seiler, NP for Dr. Graciela HusbandsKlein.  You will receive a reminder letter in the mail two months in advance. If you don't receive a letter, please call our office to schedule the follow-up appointment.   Any Additional Special Instructions Will Be Listed Below (If Applicable).     If you need a refill on your cardiac medications before your next appointment, please call your pharmacy.

## 2017-01-19 NOTE — Progress Notes (Signed)
Patient Care Team: Myrlene Brokerrawford, Elizabeth A, MD as PCP - General (Internal Medicine)   HPI  Grant [name] is a 71 y.o. male Seen in follow-up for ICD implanted for ventricular tachycardia in the context of ischemic heart disease prior stenting. He had intervertebral normalization of LV function with an echo EF 2013 55%.  Noted when first evaluated 11/15 to have resting bradycardia  we had anticipated exercise treadmill testing which was never accomplished   Device interrogation prior to that 6/17 had demonstrated only nonsustained ventricular tachycardia January 2018 he was seen in the emergency room in [place] for multiple shocks. Amiodarone was initiated;  Review of the electrograms today suggested this was all atrial fibrillation. It was irregularly irregular. Follow these were predominantly similar to sinus. There was some wide complex beats some of which were long short couple others of which were not followed significant changes and rate. Patient denies symptoms of GI intolerance, sun sensitivity, neurological symptoms attributable to amiodarone.  Surveillance laboratories were in normal limits when checked801months ago   Date TSH LFTs PFTs Cr Hgb  12/15 2.34      1/17   62  1.2   1/18  5.87 10      5/18 7.02 14   13.6          His major complaint continues to be fatigue and weakness. We reiterated the issues related to his sleep apnea  His TSH is borderline elevated and we'll need to keep close track of this; we will be decreasing his amiodarone.     Past Medical History:  Diagnosis Date  . AICD (automatic cardioverter/defibrillator) present   . CAD (coronary artery disease)   . DM (diabetes mellitus) (HCC)   . HLD (hyperlipidemia)   . Hypothyroidism   . NSVT (nonsustained ventricular tachycardia) (HCC)   . Obstructive sleep apnea     Past Surgical History:  Procedure Laterality Date  . ABDOMINAL SURGERY  1981   repair lungs, liver, and intenstines  from trauma    Current Outpatient Prescriptions  Medication Sig Dispense Refill  . albuterol (PROVENTIL HFA;VENTOLIN HFA) 108 (90 BASE) MCG/ACT inhaler Inhale 2 puffs into the lungs every 6 (six) hours as needed for wheezing or shortness of breath.    Marland Kitchen. amiodarone (PACERONE) 200 MG tablet Take 200 mg by mouth 2 (two) times daily.    Marland Kitchen. apixaban (ELIQUIS) 5 MG TABS tablet Take 5 mg by mouth 2 (two) times daily.    Marland Kitchen. atorvastatin (LIPITOR) 40 MG tablet Take 40 mg by mouth daily.     . cyclobenzaprine (FLEXERIL) 5 MG tablet Take 1 tablet (5 mg total) by mouth 2 (two) times daily as needed for muscle spasms. 60 tablet 3  . escitalopram (LEXAPRO) 10 MG tablet Take 10 mg by mouth at bedtime.    . fluticasone (FLONASE) 50 MCG/ACT nasal spray Place 2 sprays into both nostrils daily. 16 g 2  . furosemide (LASIX) 20 MG tablet Take 2 tablet by mouth in the AM and 1 tablet by mouth in the PM    . HYDROcodone-acetaminophen (NORCO/VICODIN) 5-325 MG tablet Take 1 tablet by mouth daily as needed for moderate pain.    Marland Kitchen. LEVOTHYROXINE SODIUM PO Take by mouth daily. Dosage unknown.[name]. Confer with PT..    . losartan (COZAAR) 100 MG tablet Take 100 mg by mouth daily.    . nitroGLYCERIN (NITROSTAT) 0.4 MG SL tablet Place 0.4 mg under the tongue every 5 (five) minutes as needed  for chest pain.    Marland Kitchen. omeprazole (PRILOSEC) 20 MG capsule Take 20 mg by mouth daily.    . prazosin (MINIPRESS) 2 MG capsule Take 2 mg by mouth at bedtime.    Marland Kitchen. spironolactone (ALDACTONE) 25 MG tablet Take 25 mg by mouth daily.    Marland Kitchen. terazosin (HYTRIN) 5 MG capsule Take 1 capsule (5 mg total) by mouth 2 (two) times daily. 180 capsule 3   No current facility-administered medications for this visit.     No Known Allergies    Review of Systems negative except from HPI and PMH  Physical Exam BP 120/84   Pulse 85   Ht 6\' 2"  (1.88 m)   Wt 241 lb (109.3 kg)   SpO2 96%   BMI 30.94 kg/m  Well developed and well nourished in no acute  distress HENT normal E scleral and icterus clear Neck Supple JVP flat; carotids brisk and full Clear to ausculation Device pocket well healed; without hematoma or erythema.  There is no tethering Regular rate and rhythm, no murmurs gallops or rub Soft with active bowel sounds No clubbing cyanosis  Edema Alert and oriented, grossly normal motor and sensory function Skin Warm and Dry  ECG  Sinus 53 20/10/48  Assessment and  Plan  Ischemic Cardiomyopathy--interval normalization  Ventricular Tachycardia  Atrial fibrillation   Sleep disordered breathing  ICD [employer]    The patient's device was interrogated.  The information was reviewed. No changes were made in the programming.    Sinus Bradycardia  Elevated TSH   Hypertrophic heart disease  Low-voltage ECG      He has significant fatigue and terrible snoring. He had had a positive sleep study but was not able to tolerate a mask. I've encouraged him to follow-up with his primary care physician regarding CPAP therapy.  He also has orthostasis by history and low voltage by ECG and echocardiogram described as moderate hypertrophy which raises a concern about transThyrietin  amyloid in this African-American gentleman. We will have to look into this.  No interval ventricular tachycardia or atrial fibrillation. We'll decrease his amiodarone 400--200 mg a day. Amiodarone can also aggravate orthostasis.

## 2017-01-20 LAB — BASIC METABOLIC PANEL
BUN / CREAT RATIO: 10 (ref 10–24)
BUN: 15 mg/dL (ref 8–27)
CO2: 24 mmol/L (ref 20–29)
Calcium: 9.2 mg/dL (ref 8.6–10.2)
Chloride: 94 mmol/L — ABNORMAL LOW (ref 96–106)
Creatinine, Ser: 1.48 mg/dL — ABNORMAL HIGH (ref 0.76–1.27)
GFR calc non Af Amer: 47 mL/min/{1.73_m2} — ABNORMAL LOW (ref >59)
GFR, EST AFRICAN AMERICAN: 54 mL/min/{1.73_m2} — AB (ref >59)
GLUCOSE: 236 mg/dL — AB (ref 65–99)
Potassium: 4.4 mmol/L (ref 3.5–5.2)
SODIUM: 136 mmol/L (ref 134–144)

## 2017-01-20 LAB — TSH: TSH: 6.64 u[IU]/mL — ABNORMAL HIGH (ref 0.450–4.500)

## 2017-01-31 ENCOUNTER — Telehealth: Payer: Self-pay

## 2017-01-31 NOTE — Telephone Encounter (Signed)
lmtcb for lab results

## 2017-02-02 NOTE — Telephone Encounter (Signed)
New message     Will call back tomorrow to get results he has to leave the house for awhile

## 2017-02-04 ENCOUNTER — Telehealth: Payer: Self-pay | Admitting: Internal Medicine

## 2017-02-04 ENCOUNTER — Other Ambulatory Visit: Payer: Self-pay | Admitting: *Deleted

## 2017-02-04 MED ORDER — LOSARTAN POTASSIUM 50 MG PO TABS: 50.0000 mg | ORAL_TABLET | Freq: Every day | ORAL | 3 refills | Status: DC

## 2017-02-04 NOTE — Telephone Encounter (Signed)
New message      Patient is calling to get lab results.  Pt ask that you call him back on Monday---he was getting ready to leave.

## 2017-02-04 NOTE — Telephone Encounter (Signed)
Triage- can you please follow up with the patient on Monday per his request- I will be out until Wednesday. Lab results in my in-basket  Thanks!

## 2017-02-04 NOTE — Telephone Encounter (Signed)
RX for losartan 50 mg once daily #90 w/ 3 refills mailed to the patient to take to the [place].

## 2017-02-07 NOTE — Telephone Encounter (Signed)
Patient informed and verbalized understanding of plan. Copy sent to Dr. Okey Dupre and Dr. Lucretia Roers per patient request.

## 2017-02-15 ENCOUNTER — Ambulatory Visit (INDEPENDENT_AMBULATORY_CARE_PROVIDER_SITE_OTHER): Payer: Medicare Other | Admitting: *Deleted

## 2017-02-15 DIAGNOSIS — I255 Ischemic cardiomyopathy: Secondary | ICD-10-CM | POA: Diagnosis not present

## 2017-02-15 NOTE — Progress Notes (Signed)
Remote ICD transmission.   

## 2017-02-16 LAB — CUP PACEART REMOTE DEVICE CHECK
Battery Remaining Longevity: 90 mo
Battery Remaining Percentage: 91 %
HIGH POWER IMPEDANCE MEASURED VALUE: 56 Ohm
Implantable Lead Location: 753860
Implantable Lead Model: 185
Implantable Lead Serial Number: 23456
Implantable Pulse Generator Implant Date: 20070423
Lead Channel Pacing Threshold Amplitude: 1.2 V
Lead Channel Pacing Threshold Pulse Width: 0.4 ms
Lead Channel Setting Pacing Amplitude: 2.4 V
Lead Channel Setting Sensing Sensitivity: 0.6 mV
MDC IDC LEAD IMPLANT DT: 20070423
MDC IDC MSMT LEADCHNL RV IMPEDANCE VALUE: 588 Ohm
MDC IDC PG SERIAL: 102535
MDC IDC SESS DTM: 20180828115100
MDC IDC SET LEADCHNL RV PACING PULSEWIDTH: 0.4 ms
MDC IDC STAT BRADY RV PERCENT PACED: 0 %

## 2017-02-25 ENCOUNTER — Encounter: Payer: Self-pay | Admitting: Cardiology

## 2017-03-07 ENCOUNTER — Encounter (HOSPITAL_COMMUNITY): Payer: Self-pay | Admitting: *Deleted

## 2017-03-07 DIAGNOSIS — Z955 Presence of coronary angioplasty implant and graft: Secondary | ICD-10-CM

## 2017-03-07 DIAGNOSIS — F17210 Nicotine dependence, cigarettes, uncomplicated: Secondary | ICD-10-CM | POA: Diagnosis present

## 2017-03-07 DIAGNOSIS — I472 Ventricular tachycardia: Secondary | ICD-10-CM | POA: Diagnosis not present

## 2017-03-07 DIAGNOSIS — I129 Hypertensive chronic kidney disease with stage 1 through stage 4 chronic kidney disease, or unspecified chronic kidney disease: Secondary | ICD-10-CM | POA: Diagnosis present

## 2017-03-07 DIAGNOSIS — I959 Hypotension, unspecified: Secondary | ICD-10-CM | POA: Diagnosis not present

## 2017-03-07 DIAGNOSIS — D649 Anemia, unspecified: Secondary | ICD-10-CM | POA: Diagnosis present

## 2017-03-07 DIAGNOSIS — Z23 Encounter for immunization: Secondary | ICD-10-CM

## 2017-03-07 DIAGNOSIS — Z79899 Other long term (current) drug therapy: Secondary | ICD-10-CM

## 2017-03-07 DIAGNOSIS — E1122 Type 2 diabetes mellitus with diabetic chronic kidney disease: Secondary | ICD-10-CM | POA: Diagnosis not present

## 2017-03-07 DIAGNOSIS — Z8619 Personal history of other infectious and parasitic diseases: Secondary | ICD-10-CM

## 2017-03-07 DIAGNOSIS — Z7902 Long term (current) use of antithrombotics/antiplatelets: Secondary | ICD-10-CM

## 2017-03-07 DIAGNOSIS — G4733 Obstructive sleep apnea (adult) (pediatric): Secondary | ICD-10-CM | POA: Diagnosis present

## 2017-03-07 DIAGNOSIS — Z7982 Long term (current) use of aspirin: Secondary | ICD-10-CM

## 2017-03-07 DIAGNOSIS — I2510 Atherosclerotic heart disease of native coronary artery without angina pectoris: Secondary | ICD-10-CM | POA: Diagnosis present

## 2017-03-07 DIAGNOSIS — F329 Major depressive disorder, single episode, unspecified: Secondary | ICD-10-CM | POA: Diagnosis present

## 2017-03-07 DIAGNOSIS — I255 Ischemic cardiomyopathy: Secondary | ICD-10-CM | POA: Diagnosis present

## 2017-03-07 DIAGNOSIS — E871 Hypo-osmolality and hyponatremia: Secondary | ICD-10-CM | POA: Diagnosis not present

## 2017-03-07 DIAGNOSIS — E1110 Type 2 diabetes mellitus with ketoacidosis without coma: Secondary | ICD-10-CM | POA: Diagnosis not present

## 2017-03-07 DIAGNOSIS — E039 Hypothyroidism, unspecified: Secondary | ICD-10-CM | POA: Diagnosis present

## 2017-03-07 DIAGNOSIS — E1143 Type 2 diabetes mellitus with diabetic autonomic (poly)neuropathy: Secondary | ICD-10-CM | POA: Diagnosis present

## 2017-03-07 DIAGNOSIS — Z95810 Presence of automatic (implantable) cardiac defibrillator: Secondary | ICD-10-CM

## 2017-03-07 DIAGNOSIS — N179 Acute kidney failure, unspecified: Secondary | ICD-10-CM | POA: Diagnosis present

## 2017-03-07 DIAGNOSIS — E1165 Type 2 diabetes mellitus with hyperglycemia: Secondary | ICD-10-CM | POA: Diagnosis not present

## 2017-03-07 DIAGNOSIS — E785 Hyperlipidemia, unspecified: Secondary | ICD-10-CM | POA: Diagnosis present

## 2017-03-07 DIAGNOSIS — N400 Enlarged prostate without lower urinary tract symptoms: Secondary | ICD-10-CM | POA: Diagnosis present

## 2017-03-07 LAB — BASIC METABOLIC PANEL
ANION GAP: 10 (ref 5–15)
BUN: 28 mg/dL — AB (ref 6–20)
CALCIUM: 9.3 mg/dL (ref 8.9–10.3)
CO2: 24 mmol/L (ref 22–32)
Chloride: 95 mmol/L — ABNORMAL LOW (ref 101–111)
Creatinine, Ser: 2.34 mg/dL — ABNORMAL HIGH (ref 0.61–1.24)
GFR calc Af Amer: 30 mL/min — ABNORMAL LOW (ref >60)
GFR, EST NON AFRICAN AMERICAN: 26 mL/min — AB (ref >60)
GLUCOSE: 453 mg/dL — AB (ref 65–99)
POTASSIUM: 4.4 mmol/L (ref 3.5–5.1)
SODIUM: 129 mmol/L — AB (ref 135–145)

## 2017-03-07 LAB — CBG MONITORING, ED: Glucose-Capillary: 473 mg/dL — ABNORMAL HIGH (ref 65–99)

## 2017-03-07 LAB — CBC
HCT: 38.8 % — ABNORMAL LOW (ref 39.0–52.0)
HEMOGLOBIN: 13.1 g/dL (ref 13.0–17.0)
MCH: 29.2 pg (ref 26.0–34.0)
MCHC: 33.8 g/dL (ref 30.0–36.0)
MCV: 86.6 fL (ref 78.0–100.0)
Platelets: 182 10*3/uL (ref 150–400)
RBC: 4.48 MIL/uL (ref 4.22–5.81)
RDW: 12.2 % (ref 11.5–15.5)
WBC: 8.5 10*3/uL (ref 4.0–10.5)

## 2017-03-07 NOTE — ED Triage Notes (Signed)
Pt has been having L sided head pain and dizziness x3 days. Wife check blood glucose tonight and read over 500. Pt has been told he's borderline diabetic but is not currently on diabetes medications.

## 2017-03-08 ENCOUNTER — Inpatient Hospital Stay (HOSPITAL_COMMUNITY)
Admission: EM | Admit: 2017-03-08 | Discharge: 2017-03-10 | DRG: 638 | Disposition: A | Discharge status: Home / Self Care | Payer: Medicare Other | Attending: Internal Medicine | Admitting: Internal Medicine

## 2017-03-08 ENCOUNTER — Observation Stay (HOSPITAL_COMMUNITY): Payer: Medicare Other

## 2017-03-08 DIAGNOSIS — Z8619 Personal history of other infectious and parasitic diseases: Secondary | ICD-10-CM | POA: Diagnosis present

## 2017-03-08 DIAGNOSIS — R109 Unspecified abdominal pain: Secondary | ICD-10-CM | POA: Insufficient documentation

## 2017-03-08 DIAGNOSIS — E039 Hypothyroidism, unspecified: Secondary | ICD-10-CM | POA: Diagnosis not present

## 2017-03-08 DIAGNOSIS — R739 Hyperglycemia, unspecified: Secondary | ICD-10-CM | POA: Diagnosis present

## 2017-03-08 DIAGNOSIS — N179 Acute kidney failure, unspecified: Secondary | ICD-10-CM | POA: Diagnosis not present

## 2017-03-08 DIAGNOSIS — I2510 Atherosclerotic heart disease of native coronary artery without angina pectoris: Secondary | ICD-10-CM | POA: Diagnosis present

## 2017-03-08 DIAGNOSIS — Z720 Tobacco use: Secondary | ICD-10-CM | POA: Diagnosis not present

## 2017-03-08 DIAGNOSIS — N400 Enlarged prostate without lower urinary tract symptoms: Secondary | ICD-10-CM | POA: Diagnosis present

## 2017-03-08 DIAGNOSIS — F17210 Nicotine dependence, cigarettes, uncomplicated: Secondary | ICD-10-CM | POA: Diagnosis present

## 2017-03-08 DIAGNOSIS — E1110 Type 2 diabetes mellitus with ketoacidosis without coma: Secondary | ICD-10-CM

## 2017-03-08 DIAGNOSIS — E118 Type 2 diabetes mellitus with unspecified complications: Secondary | ICD-10-CM | POA: Diagnosis not present

## 2017-03-08 DIAGNOSIS — I10 Essential (primary) hypertension: Secondary | ICD-10-CM | POA: Diagnosis present

## 2017-03-08 DIAGNOSIS — Z95810 Presence of automatic (implantable) cardiac defibrillator: Secondary | ICD-10-CM

## 2017-03-08 HISTORY — DX: Benign prostatic hyperplasia without lower urinary tract symptoms: N40.0

## 2017-03-08 HISTORY — DX: Hyperglycemia, unspecified: R73.9

## 2017-03-08 LAB — URINALYSIS, ROUTINE W REFLEX MICROSCOPIC
BILIRUBIN URINE: NEGATIVE
Bacteria, UA: NONE SEEN
HGB URINE DIPSTICK: NEGATIVE
Ketones, ur: NEGATIVE mg/dL
LEUKOCYTES UA: NEGATIVE
NITRITE: NEGATIVE
Protein, ur: NEGATIVE mg/dL
Specific Gravity, Urine: 1.011 (ref 1.005–1.030)
WBC, UA: NONE SEEN WBC/hpf (ref 0–5)
pH: 5 (ref 5.0–8.0)

## 2017-03-08 LAB — I-STAT VENOUS BLOOD GAS, ED
Bicarbonate: 27.3 mmol/L (ref 20.0–28.0)
O2 SAT: 25 %
PCO2 VEN: 53.7 mmHg (ref 44.0–60.0)
PH VEN: 7.313 (ref 7.250–7.430)
PO2 VEN: 19 mmHg — AB (ref 32.0–45.0)
TCO2: 29 mmol/L (ref 22–32)

## 2017-03-08 LAB — CBC
HEMATOCRIT: 36.7 % — AB (ref 39.0–52.0)
HEMOGLOBIN: 12.4 g/dL — AB (ref 13.0–17.0)
MCH: 29.2 pg (ref 26.0–34.0)
MCHC: 33.8 g/dL (ref 30.0–36.0)
MCV: 86.6 fL (ref 78.0–100.0)
Platelets: 154 10*3/uL (ref 150–400)
RBC: 4.24 MIL/uL (ref 4.22–5.81)
RDW: 12.3 % (ref 11.5–15.5)
WBC: 7.1 10*3/uL (ref 4.0–10.5)

## 2017-03-08 LAB — BASIC METABOLIC PANEL
Anion gap: 8 (ref 5–15)
BUN: 26 mg/dL — ABNORMAL HIGH (ref 6–20)
CALCIUM: 8.6 mg/dL — AB (ref 8.9–10.3)
CHLORIDE: 103 mmol/L (ref 101–111)
CO2: 22 mmol/L (ref 22–32)
Creatinine, Ser: 1.98 mg/dL — ABNORMAL HIGH (ref 0.61–1.24)
GFR calc non Af Amer: 32 mL/min — ABNORMAL LOW (ref >60)
GFR, EST AFRICAN AMERICAN: 37 mL/min — AB (ref >60)
Glucose, Bld: 315 mg/dL — ABNORMAL HIGH (ref 65–99)
POTASSIUM: 3.9 mmol/L (ref 3.5–5.1)
Sodium: 133 mmol/L — ABNORMAL LOW (ref 135–145)

## 2017-03-08 LAB — HEPATIC FUNCTION PANEL
ALK PHOS: 57 U/L (ref 38–126)
ALT: 16 U/L — AB (ref 17–63)
AST: 17 U/L (ref 15–41)
Albumin: 3.7 g/dL (ref 3.5–5.0)
BILIRUBIN INDIRECT: 0.4 mg/dL (ref 0.3–0.9)
Bilirubin, Direct: 0.1 mg/dL (ref 0.1–0.5)
Total Bilirubin: 0.5 mg/dL (ref 0.3–1.2)
Total Protein: 6.1 g/dL — ABNORMAL LOW (ref 6.5–8.1)

## 2017-03-08 LAB — CBG MONITORING, ED
GLUCOSE-CAPILLARY: 498 mg/dL — AB (ref 65–99)
Glucose-Capillary: 335 mg/dL — ABNORMAL HIGH (ref 65–99)

## 2017-03-08 LAB — PROTIME-INR
INR: 1.13
Prothrombin Time: 14.4 seconds (ref 11.4–15.2)

## 2017-03-08 LAB — GLUCOSE, CAPILLARY
Glucose-Capillary: 354 mg/dL — ABNORMAL HIGH (ref 65–99)
Glucose-Capillary: 307 mg/dL — ABNORMAL HIGH (ref 65–99)

## 2017-03-08 LAB — HEMOGLOBIN A1C
HEMOGLOBIN A1C: 12.4 % — AB (ref 4.8–5.6)
Mean Plasma Glucose: 309.18 mg/dL

## 2017-03-08 LAB — TROPONIN I

## 2017-03-08 MED ORDER — FLUTICASONE PROPIONATE 50 MCG/ACT NA SUSP
2.0000 | Freq: Every day | NASAL | Status: DC
  Administered 2017-03-08 – 2017-03-10 (×3): 2 via NASAL
  Filled 2017-03-08: qty 16

## 2017-03-08 MED ORDER — ACETAMINOPHEN 325 MG PO TABS: 650.0000 mg | ORAL_TABLET | Freq: Four times a day (QID) | ORAL | Status: DC | PRN

## 2017-03-08 MED ORDER — TERAZOSIN HCL 5 MG PO CAPS
5.0000 mg | ORAL_CAPSULE | Freq: Two times a day (BID) | ORAL | Status: DC
  Administered 2017-03-08 – 2017-03-10 (×3): 5 mg via ORAL
  Filled 2017-03-08 (×5): qty 1

## 2017-03-08 MED ORDER — SODIUM CHLORIDE 0.9 % IV SOLN
INTRAVENOUS | Status: DC
  Administered 2017-03-08 – 2017-03-10 (×4): via INTRAVENOUS

## 2017-03-08 MED ORDER — INSULIN ASPART 100 UNIT/ML ~~LOC~~ SOLN
12.0000 [IU] | Freq: Once | SUBCUTANEOUS | Status: AC
  Administered 2017-03-08: 12 [IU] via INTRAVENOUS
  Filled 2017-03-08: qty 1

## 2017-03-08 MED ORDER — ESCITALOPRAM OXALATE 10 MG PO TABS
10.0000 mg | ORAL_TABLET | Freq: Every day | ORAL | Status: DC
  Administered 2017-03-08 – 2017-03-09 (×2): 10 mg via ORAL
  Filled 2017-03-08 (×2): qty 1

## 2017-03-08 MED ORDER — ASPIRIN EC 81 MG PO TBEC
81.0000 mg | DELAYED_RELEASE_TABLET | Freq: Every day | ORAL | Status: DC
  Administered 2017-03-08 – 2017-03-10 (×3): 81 mg via ORAL
  Filled 2017-03-08 (×3): qty 1

## 2017-03-08 MED ORDER — PANTOPRAZOLE SODIUM 40 MG PO TBEC
40.0000 mg | DELAYED_RELEASE_TABLET | Freq: Every day | ORAL | Status: DC
  Administered 2017-03-08 – 2017-03-10 (×3): 40 mg via ORAL
  Filled 2017-03-08 (×3): qty 1

## 2017-03-08 MED ORDER — INSULIN ASPART 100 UNIT/ML ~~LOC~~ SOLN
0.0000 [IU] | Freq: Three times a day (TID) | SUBCUTANEOUS | Status: DC
  Administered 2017-03-08 (×2): 11 [IU] via SUBCUTANEOUS
  Administered 2017-03-09: 5 [IU] via SUBCUTANEOUS
  Administered 2017-03-09: 11 [IU] via SUBCUTANEOUS
  Administered 2017-03-09: 8 [IU] via SUBCUTANEOUS
  Administered 2017-03-10: 2 [IU] via SUBCUTANEOUS
  Administered 2017-03-10: 8 [IU] via SUBCUTANEOUS
  Filled 2017-03-08: qty 1

## 2017-03-08 MED ORDER — NITROGLYCERIN 0.4 MG SL SUBL: 0.4000 mg | SUBLINGUAL_TABLET | SUBLINGUAL | Status: DC | PRN

## 2017-03-08 MED ORDER — SODIUM CHLORIDE 0.9 % IV BOLUS (SEPSIS)
500.0000 mL | Freq: Once | INTRAVENOUS | Status: AC
  Administered 2017-03-08: 500 mL via INTRAVENOUS

## 2017-03-08 MED ORDER — NICOTINE 14 MG/24HR TD PT24
14.0000 mg | MEDICATED_PATCH | Freq: Every day | TRANSDERMAL | Status: DC
  Administered 2017-03-09 – 2017-03-10 (×2): 14 mg via TRANSDERMAL
  Filled 2017-03-08 (×3): qty 1

## 2017-03-08 MED ORDER — ATORVASTATIN CALCIUM 40 MG PO TABS
40.0000 mg | ORAL_TABLET | Freq: Every day | ORAL | Status: DC
  Administered 2017-03-08 – 2017-03-09 (×2): 40 mg via ORAL
  Filled 2017-03-08 (×3): qty 1

## 2017-03-08 MED ORDER — METOCLOPRAMIDE HCL 5 MG/ML IJ SOLN
10.0000 mg | Freq: Three times a day (TID) | INTRAMUSCULAR | Status: DC
  Administered 2017-03-08 – 2017-03-10 (×6): 10 mg via INTRAVENOUS
  Filled 2017-03-08 (×6): qty 2

## 2017-03-08 MED ORDER — VITAMIN D 1000 UNITS PO TABS
2000.0000 [IU] | ORAL_TABLET | Freq: Every day | ORAL | Status: DC
  Administered 2017-03-08 – 2017-03-10 (×3): 2000 [IU] via ORAL
  Filled 2017-03-08 (×6): qty 2

## 2017-03-08 MED ORDER — ALBUTEROL SULFATE (2.5 MG/3ML) 0.083% IN NEBU
2.5000 mg | INHALATION_SOLUTION | Freq: Two times a day (BID) | RESPIRATORY_TRACT | Status: DC
  Administered 2017-03-08 – 2017-03-10 (×4): 2.5 mg via RESPIRATORY_TRACT
  Filled 2017-03-08 (×4): qty 3

## 2017-03-08 MED ORDER — APIXABAN 5 MG PO TABS
5.0000 mg | ORAL_TABLET | Freq: Two times a day (BID) | ORAL | Status: DC
  Administered 2017-03-08 – 2017-03-10 (×5): 5 mg via ORAL
  Filled 2017-03-08 (×6): qty 1

## 2017-03-08 MED ORDER — INSULIN ASPART 100 UNIT/ML ~~LOC~~ SOLN
0.0000 [IU] | Freq: Every day | SUBCUTANEOUS | Status: DC
  Administered 2017-03-08: 3 [IU] via SUBCUTANEOUS

## 2017-03-08 MED ORDER — LEVOTHYROXINE SODIUM 50 MCG PO TABS
50.0000 ug | ORAL_TABLET | Freq: Every day | ORAL | Status: DC
  Administered 2017-03-09 – 2017-03-10 (×2): 50 ug via ORAL
  Filled 2017-03-08 (×2): qty 1

## 2017-03-08 MED ORDER — ONDANSETRON HCL 4 MG/2ML IJ SOLN: 4.0000 mg | Freq: Four times a day (QID) | INTRAMUSCULAR | Status: DC | PRN

## 2017-03-08 MED ORDER — ONDANSETRON HCL 4 MG PO TABS: 4.0000 mg | ORAL_TABLET | Freq: Four times a day (QID) | ORAL | Status: DC | PRN

## 2017-03-08 MED ORDER — SODIUM CHLORIDE 0.9 % IV SOLN
Freq: Once | INTRAVENOUS | Status: AC
  Administered 2017-03-08: 09:00:00 via INTRAVENOUS

## 2017-03-08 MED ORDER — ACETAMINOPHEN 650 MG RE SUPP: 650.0000 mg | Freq: Four times a day (QID) | RECTAL | Status: DC | PRN

## 2017-03-08 MED ORDER — AMIODARONE HCL 200 MG PO TABS
200.0000 mg | ORAL_TABLET | Freq: Every day | ORAL | Status: DC
  Administered 2017-03-08 – 2017-03-10 (×3): 200 mg via ORAL
  Filled 2017-03-08 (×3): qty 1

## 2017-03-08 MED ORDER — ALBUTEROL SULFATE (2.5 MG/3ML) 0.083% IN NEBU
2.5000 mg | INHALATION_SOLUTION | Freq: Two times a day (BID) | RESPIRATORY_TRACT | Status: DC
Start: 2017-03-08 — End: 2017-03-08
  Filled 2017-03-08: qty 3

## 2017-03-08 MED ORDER — SENNOSIDES-DOCUSATE SODIUM 8.6-50 MG PO TABS
1.0000 | ORAL_TABLET | Freq: Every evening | ORAL | Status: DC | PRN
  Filled 2017-03-08: qty 1

## 2017-03-08 MED ORDER — CYCLOBENZAPRINE HCL 5 MG PO TABS: 5.0000 mg | ORAL_TABLET | Freq: Two times a day (BID) | ORAL | Status: DC | PRN

## 2017-03-08 NOTE — ED Notes (Signed)
Pt CBG, 498. Nurse was notified.

## 2017-03-08 NOTE — ED Notes (Signed)
Report attempted 

## 2017-03-08 NOTE — ED Provider Notes (Signed)
[facility]-EMERGENCY DEPT Provider Note   CSN: [id number] Arrival date & time: 03/07/17  2304     History   Chief Complaint Chief Complaint  Patient presents with  . Dizziness  . Hyperglycemia    HPI Grant [name] is a 71 y.o. male.  HPI Patient reports he has been having lightheadedness and dizziness. Symptoms started 3 days ago on Saturday. He reports he was having episodes of chest pain and felt like his pacer would end up going off. He reports he felt lightheaded and like he might pass out. He did take nitroglycerin at that time. After taking 2, symptoms did seem somewhat improved. He was not contacted by [employer] for detected dysrhythmia. He however has been having ongoing feeling of lightheadedness. His blood sugars have been elevated. His wife has been monitoring them and at times they read greater than 500. The patient reports that at the beginning of the year he was told he was borderline diabetic at the [place]. He and his wife reports they were given some dietary instructions but not started on any medications. Patient reports he has been very thirsty over these past couple of days, urinating frequently and generally feeling dizzy and fatigued. Past Medical History:  Diagnosis Date  . AICD (automatic cardioverter/defibrillator) present   . CAD (coronary artery disease)   . DM (diabetes mellitus) (HCC)   . HLD (hyperlipidemia)   . Hypothyroidism   . NSVT (nonsustained ventricular tachycardia) (HCC)   . Obstructive sleep apnea     Patient Active Problem List   Diagnosis Date Noted  . Hyperglycemia, unspecified 03/08/2017  . Viral URI with cough 09/10/2015  . Upper airway cough syndrome 06/05/2015  . Hx of hepatitis C 05/06/2015  . Muscle cramp 05/06/2015  . Diet-controlled diabetes mellitus (HCC) 06/04/2014  . Essential hypertension 06/04/2014  . BPH (benign prostatic hypertrophy) 06/04/2014  . Tobacco abuse 06/04/2014  . Osteoarthritis 06/04/2014  . Hypothyroidism  06/04/2014  . CAD (coronary artery disease) 06/04/2014    Past Surgical History:  Procedure Laterality Date  . ABDOMINAL SURGERY  1981   repair lungs, liver, and intenstines from trauma       Home Medications    Prior to Admission medications   Medication Sig Start Date End Date Taking? Authorizing Provider  acetaminophen (TYLENOL) 500 MG tablet Take 1,000 mg by mouth every 6 (six) hours as needed for mild pain or headache.   Yes [provider]  albuterol (PROVENTIL HFA;VENTOLIN HFA) 108 (90 BASE) MCG/ACT inhaler Inhale 2 puffs into the lungs 2 (two) times daily.    Yes [provider]  amiodarone (PACERONE) 200 MG tablet Take 200 mg by mouth daily.   Yes [provider]  apixaban (ELIQUIS) 5 MG TABS tablet Take 5 mg by mouth 2 (two) times daily.   Yes [provider]  aspirin EC 81 MG tablet Take 81 mg by mouth daily.   Yes [provider]  atorvastatin (LIPITOR) 40 MG tablet Take 40 mg by mouth at bedtime.    Yes [provider]  Cholecalciferol (VITAMIN D3) 2000 units TABS Take 2,000 Units by mouth daily.   Yes [provider]  CINNAMON PO Take 1,000 mg by mouth 2 (two) times daily.   Yes [provider]  cyclobenzaprine (FLEXERIL) 10 MG tablet Take 10 mg by mouth at bedtime as needed for muscle spasms.   Yes [provider]  escitalopram (LEXAPRO) 10 MG tablet Take 10 mg by mouth at bedtime.  Yes Sondra Come, MD  fluticasone [facility]) 50 MCG/ACT nasal spray Place 2 sprays into both nostrils daily. 06/05/15  Yes Myrlene Broker, MD  furosemide (LASIX) 20 MG tablet Take 20-40 mg by mouth 2 (two) times daily. Take 2 tablets by mouth in the morning and take 1 tablet by mouth in the evening   Yes [provider]  HYDROcodone-acetaminophen (NORCO/VICODIN) 5-325 MG tablet Take 1 tablet by mouth 3 (three) times daily as needed for moderate pain.    Yes Sondra Come, MD  levothyroxine  (SYNTHROID, LEVOTHROID) 50 MCG tablet Take 50 mcg by mouth daily before breakfast.   Yes [provider]  losartan (COZAAR) 100 MG tablet Take 100 mg by mouth daily.   Yes [provider]  nitroGLYCERIN (NITROSTAT) 0.4 MG SL tablet Place 0.4 mg under the tongue every 5 (five) minutes as needed for chest pain.   Yes [provider]  omeprazole (PRILOSEC) 20 MG capsule Take 40 mg by mouth 2 (two) times daily before a meal.    Yes [provider]  potassium chloride SA (K-DUR,KLOR-CON) 20 MEQ tablet Take 20 mEq by mouth daily.   Yes [provider]  spironolactone (ALDACTONE) 25 MG tablet Take 25 mg by mouth daily.   Yes [provider]  terazosin (HYTRIN) 5 MG capsule Take 1 capsule (5 mg total) by mouth 2 (two) times daily. 04/18/15  Yes Myrlene Broker, MD  cyclobenzaprine (FLEXERIL) 5 MG tablet Take 1 tablet (5 mg total) by mouth 2 (two) times daily as needed for muscle spasms. 05/06/15   Myrlene Broker, MD  losartan (COZAAR) 50 MG tablet Take 1 tablet (50 mg total) by mouth daily. 02/04/17 05/05/17  Duke Salvia, MD  prazosin (MINIPRESS) 2 MG capsule Take 2 mg by mouth at bedtime.    Sondra Come, MD    Family History Family History  Problem Relation Age of Onset  . CAD Mother   . Hypertension Mother   . Cancer - Colon Father   . Cancer - Colon Brother     Social History Social History  Substance Use Topics  . Smoking status: Current Every Day Smoker    Packs/day: 0.80    Years: 40.00    Types: Cigarettes  . Smokeless tobacco: Current User    Types: Chew     Comment: Quit x 6 months; with patches and pills   . Alcohol use Not on file     Allergies   Food   Review of Systems Review of Systems 10 Systems reviewed and are negative for acute change except as noted in the HPI.   Physical Exam Updated Vital Signs BP 117/79   Pulse (!) 54   Temp 98.1 F (36.7 C) (Oral)   Resp 18   Ht  (1.905 m)    Wt 106.6 kg (235 lb)   SpO2 99%   BMI 29.37 kg/m   Physical Exam  Constitutional: He is oriented to person, place, and time. He appears well-developed and well-nourished.  HENT:  Head: Normocephalic and atraumatic.  Mouth/Throat: Oropharynx is clear and moist.  Eyes: Conjunctivae and EOM are normal.  Neck: Neck supple.  Cardiovascular: Normal rate and regular rhythm.   No murmur heard. Pulmonary/Chest: Effort normal and breath sounds normal. No respiratory distress.  Abdominal: Soft. There is no tenderness.  Musculoskeletal: He exhibits no edema or tenderness.  Condition of lower extremities is very good. No wounds on feet. No peripheral edema.  Neurological: He  is alert and oriented to person, place, and time. No cranial nerve deficit. He exhibits normal muscle tone. Coordination normal.  Skin: Skin is warm and dry.  Psychiatric: He has a normal mood and affect.  Nursing note and vitals reviewed.    ED Treatments / Results  Labs (all labs ordered are listed, but only abnormal results are displayed) Labs Reviewed  BASIC METABOLIC PANEL - Abnormal; Notable for the following:       Result Value   Sodium 129 (*)    Chloride 95 (*)    Glucose, Bld 453 (*)    BUN 28 (*)    Creatinine, Ser 2.34 (*)    GFR calc non Af Amer 26 (*)    GFR calc Af Amer 30 (*)    All other components within normal limits  CBC - Abnormal; Notable for the following:    HCT 38.8 (*)    All other components within normal limits  URINALYSIS, ROUTINE W REFLEX MICROSCOPIC - Abnormal; Notable for the following:    Color, Urine STRAW (*)    Glucose, UA >=500 (*)    Squamous Epithelial / LPF 0-5 (*)    All other components within normal limits  CBG MONITORING, ED - Abnormal; Notable for the following:    Glucose-Capillary 473 (*)    All other components within normal limits  CBG MONITORING, ED - Abnormal; Notable for the following:    Glucose-Capillary 498 (*)    All other components within normal  limits  I-STAT VENOUS BLOOD GAS, ED - Abnormal; Notable for the following:    pO2, Ven 19.0 (*)    All other components within normal limits  TROPONIN I  PROTIME-INR  HEMOGLOBIN A1C  BLOOD GAS, VENOUS    EKG  EKG Interpretation  Date/Time:  Tuesday March 08 2017 08:23:35 EDT Ventricular Rate:  52 PR Interval:    QRS Duration: 125 QT Interval:  497 QTC Calculation: 463 R Axis:   -5 Text Interpretation:  sinus bradycardia Nonspecific intraventricular conduction delay no STEMI. low voltage. no old comparison Confirmed by Arby Barrette [phone]) on 03/08/2017 8:45:37 AM       Radiology No results found.  Procedures Procedures (including critical care time)  Medications Ordered in ED Medications  insulin aspart (novoLOG) injection 12 Units (not administered)  0.9 %  sodium chloride infusion ( Intravenous New Bag/Given 03/08/17 0840)     Initial Impression / Assessment and Plan / ED Course  I have reviewed the triage vital signs and the nursing notes.  Pertinent labs & imaging results that were available during my care of the patient were reviewed by me and considered in my medical decision making (see chart for details).    Consult: Hospitalist for admission Final Clinical Impressions(s) / ED Diagnoses   Final diagnoses:  AKI (acute kidney injury) (HCC)  Hyperglycemia  Type 2 diabetes mellitus with ketoacidosis without coma, without long-term current use of insulin (HCC)    New Prescriptions New Prescriptions   No medications on file     Arby Barrette, MD 03/08/17 1014

## 2017-03-08 NOTE — H&P (Signed)
[facility] History and Physical  Grant [name] ZOX:[id number] DOB: Oct 14, 1945 DOA: 03/08/2017  Referring physician: Donnald Garre, MD PCP: Myrlene Broker, MD Kathry[facility] EP: Graciela Husbands  Chief Complaint: lightheaded, dizzy  HPI: Grant [name] is a 71 y.o. male with pmh ICM, NSVT s/p ICD implantation 2007, CAD with prior stent, tobacco abuse, remote hx heavy etoh use, HLD, hypothyroidism and OSA. Last seen by cardiology on 01/19/17 for routine follow-up. At that visit, his Amiodarone and Losartan doses were decreased given orthostatis and complaints of fatigue. One week history of lightheadedness and dizziness. Wife checked blood sugar with her meter with readings 400-500's. He has been diagnosed as prediabetic in the past, but never on medications. Pt describes episode of nonradiating chest pain on Saturday relieved with 2 SL nitroglycerins. No recurrent chest pain. Denies recent fever, cough, sob, n/v. +polyuria, +polydipsia, some abdominal discomfort with 1-2 episodes of diarrhea over the weekend. Denies melena, hematochezia or hematemesis.   ED COURSE CBC done 12 hours prior to admit unremarkable. BMET shows Na 129, K 4.4, glucose 453 and anion gap of 10. Initial troponin <0.03. A1C  12.4. INR 1.13. Pt has received 1L NS IVF and 12 units Novolog IV.   Review of Systems:  As stated in hpi, otherwise negative   Past Medical History:  Diagnosis Date  . AICD (automatic cardioverter/defibrillator) present   . CAD (coronary artery disease)   . DM (diabetes mellitus) (HCC)   . HLD (hyperlipidemia)   . Hypothyroidism   . NSVT (nonsustained ventricular tachycardia) (HCC)   . Obstructive sleep apnea     Social History:  reports that he has been smoking Cigarettes.  He has a 32.00 pack-year smoking history. His smokeless tobacco use includes Chew. His alcohol and drug histories are not on file.  Allergies  Allergen Reactions  . Food     PT EATS NO LAMB OR GOAT.     Family History  Problem Relation Age of Onset  . CAD Mother   . Hypertension Mother   . Cancer - Colon Father   . Cancer - Colon Brother      Prior to Admission medications   Medication Sig Start Date End Date Taking? Authorizing Provider  acetaminophen (TYLENOL) 500 MG tablet Take 1,000 mg by mouth every 6 (six) hours as needed for mild pain or headache.   Yes [provider]  albuterol (PROVENTIL HFA;VENTOLIN HFA) 108 (90 BASE) MCG/ACT inhaler Inhale 2 puffs into the lungs 2 (two) times daily.    Yes [provider]  amiodarone (PACERONE) 200 MG tablet Take 200 mg by mouth daily.   Yes [provider]  apixaban (ELIQUIS) 5 MG TABS tablet Take 5 mg by mouth 2 (two) times daily.   Yes [provider]  aspirin EC 81 MG tablet Take 81 mg by mouth daily.   Yes [provider]  atorvastatin (LIPITOR) 40 MG tablet Take 40 mg by mouth at bedtime.    Yes [provider]  Cholecalciferol (VITAMIN D3) 2000 units TABS Take 2,000 Units by mouth daily.   Yes [provider]  CINNAMON PO Take 1,000 mg by mouth 2 (two) times daily.   Yes [provider]  cyclobenzaprine (FLEXERIL) 10 MG tablet Take 10 mg by mouth at bedtime as needed for muscle spasms.   Yes [provider]  escitalopram (LEXAPRO) 10 MG tablet Take 10 mg by mouth at bedtime.   Yes Sondra Come, MD  fluticasone [facility]) 50 MCG/ACT nasal spray  Place 2 sprays into both nostrils daily. 06/05/15  Yes Myrlene Broker, MD  furosemide (LASIX) 20 MG tablet Take 20-40 mg by mouth 2 (two) times daily. Take 2 tablets by mouth in the morning and take 1 tablet by mouth in the evening   Yes [provider]  HYDROcodone-acetaminophen (NORCO/VICODIN) 5-325 MG tablet Take 1 tablet by mouth 3 (three) times daily as needed for moderate pain.    Yes Sondra Come, MD  levothyroxine (SYNTHROID, LEVOTHROID) 50 MCG tablet Take 50 mcg by mouth daily before  breakfast.   Yes [provider]  losartan (COZAAR) 100 MG tablet Take 100 mg by mouth daily.   Yes [provider]  nitroGLYCERIN (NITROSTAT) 0.4 MG SL tablet Place 0.4 mg under the tongue every 5 (five) minutes as needed for chest pain.   Yes [provider]  omeprazole (PRILOSEC) 20 MG capsule Take 40 mg by mouth 2 (two) times daily before a meal.    Yes [provider]  potassium chloride SA (K-DUR,KLOR-CON) 20 MEQ tablet Take 20 mEq by mouth daily.   Yes [provider]  spironolactone (ALDACTONE) 25 MG tablet Take 25 mg by mouth daily.   Yes [provider]  terazosin (HYTRIN) 5 MG capsule Take 1 capsule (5 mg total) by mouth 2 (two) times daily. 04/18/15  Yes Myrlene Broker, MD  cyclobenzaprine (FLEXERIL) 5 MG tablet Take 1 tablet (5 mg total) by mouth 2 (two) times daily as needed for muscle spasms. 05/06/15   Myrlene Broker, MD  losartan (COZAAR) 50 MG tablet Take 1 tablet (50 mg total) by mouth daily. 02/04/17 05/05/17  Duke Salvia, MD  prazosin (MINIPRESS) 2 MG capsule Take 2 mg by mouth at bedtime.    Sondra Come, MD   Physical Exam: Vitals:   03/08/17 0830 03/08/17 0856 03/08/17 0915 03/08/17 1015  BP: 103/71 99/68 117/79 107/76  Pulse: (!) 50 (!) 51 (!) 54 (!) 49  Resp: Temp:      TempSrc:      SpO2: 100% 100% 99% 97%  Weight:      Height:        Wt Readings from Last 3 Encounters:  03/07/17 106.6 kg (235 lb)  01/19/17 109.3 kg (241 lb)  10/19/16 105.7 kg (233 lb)    General: Awake, alert sitting upright in bed in NAD Eyes: EOMI, normal lids, iris. No scleral icterus  ENT: grossly normal hearing, lips & tongue. No oropharyngeal lesions Neck: no LAD, masses or thyromegaly Cardiovascular: regular rhythm, no m/r/g. No LE edema.  Respiratory: CTA bilaterally, no w/r/r. Normal respiratory effort. Abdomen: soft, nontender, BS+, no rebound or guarding Skin: no rash or induration seen on  limited exam Musculoskeletal: grossly normal tone BUE/BLE Psychiatric: grossly normal mood and affect, speech fluent and appropriate Neurologic: CN 2-12 grossly intact          Labs on Admission:  Basic Metabolic Panel:  Recent Labs Lab 03/07/17 2320  NA 129*  K 4.4  CL 95*  CO2 24  GLUCOSE 453*  BUN 28*  CREATININE 2.34*  CALCIUM 9.3   Liver Function Tests: No results for input(s): AST, ALT, ALKPHOS, BILITOT, PROT, ALBUMIN in the last 168 hours. No results for input(s): LIPASE, AMYLASE in the last 168 hours. No results for input(s): AMMONIA in the last 168 hours. CBC:  Recent Labs Lab 03/07/17 2320  WBC 8.5  HGB 13.1  HCT 38.8*  MCV 86.6  PLT  182   Cardiac Enzymes:  Recent Labs Lab 03/08/17 0817  TROPONINI <0.03    BNP (last 3 results) No results for input(s): BNP in the last 8760 hours.  ProBNP (last 3 results) No results for input(s): PROBNP in the last 8760 hours.   Serum creatinine: 2.34 mg/dL (H) 40/98/11 9147 Estimated creatinine clearance: 37.7 mL/min (A)  CBG:  Recent Labs Lab 03/07/17 2324 03/08/17 0935  GLUCAP 473* 498*    Radiological Exams on Admission: No results found.  EKG: Independently reviewed. Sinus bradycardia. No acute T wave changes  Assessment/Plan Active Problems:   Essential hypertension   Benign prostatic hyperplasia   Tobacco abuse   Hypothyroidism   CAD (coronary artery disease)   Hx of hepatitis C   Hyperglycemia, unspecified   ICD (implantable cardioverter-defibrillator) in place   Acute kidney injury [facility])   Hyperglycemia/Newly diagnosed DM2 -non ketotic. Anion gap 10 on ED arrival. Will recheck now -A1C 12.4, check c peptide -SSI for now, gentle IVF hydration -diabetic teaching  Acute kidney injury -suspect prerenal given above issues and mild dehydration a/w renal toxic meds -Creatinine 2.34<--1.48 (8/1) -hold diuretics for now, gentle IVFs -am labs  Abdominal discomfort -exam  unremarkable. ? Related to hyperglycemia +/- gastroparesis. -check abdominal film, LFTs  Hx ICM -per cardiology note on 01/19/17, last EF 2013 55% -appears stable on exam today. Holding diuretics for now with above issues.  -Monitor  Hx CAD  -remote stenting, but specifics not on file. -no cardiac complaints today, but did have some chest discomfort relieved with nitro on 9/15 -ekg sinus bradycardia with no evidence of ischemia. Initial trop negative -continue ASA. Monitor.   Hx NSVT -ICD interrogated in ED d/t presenting complaints of dizziness. No events documented -continue amiodarone  -on Eliquis (? Hx afib mentioned in recent cardiology note)  Tobacco Abuse -pt counseled -nicotine patch  Hx htn -actually soft BP on admit -holding ARB and diuretics with renal function -monitor   Code Status: full code DVT Prophylaxis: SQ Lovenox Family Communication: wife at bedside on exam Disposition Plan: Pending Improvement    Army [place], NP [phone] [facility] [email or web address] Password [email or web address]

## 2017-03-08 NOTE — ED Notes (Signed)
While obtaining orthostatic vital signs on pt, when pt stood up from sitting position pt c/o dizziness.

## 2017-03-09 ENCOUNTER — Encounter (HOSPITAL_COMMUNITY): Payer: Self-pay | Admitting: General Practice

## 2017-03-09 DIAGNOSIS — E1122 Type 2 diabetes mellitus with diabetic chronic kidney disease: Secondary | ICD-10-CM | POA: Diagnosis present

## 2017-03-09 DIAGNOSIS — F17210 Nicotine dependence, cigarettes, uncomplicated: Secondary | ICD-10-CM | POA: Diagnosis present

## 2017-03-09 DIAGNOSIS — D649 Anemia, unspecified: Secondary | ICD-10-CM | POA: Diagnosis present

## 2017-03-09 DIAGNOSIS — G4733 Obstructive sleep apnea (adult) (pediatric): Secondary | ICD-10-CM | POA: Diagnosis present

## 2017-03-09 DIAGNOSIS — Z95810 Presence of automatic (implantable) cardiac defibrillator: Secondary | ICD-10-CM | POA: Diagnosis not present

## 2017-03-09 DIAGNOSIS — N179 Acute kidney failure, unspecified: Secondary | ICD-10-CM

## 2017-03-09 DIAGNOSIS — R739 Hyperglycemia, unspecified: Secondary | ICD-10-CM | POA: Diagnosis present

## 2017-03-09 DIAGNOSIS — E785 Hyperlipidemia, unspecified: Secondary | ICD-10-CM | POA: Diagnosis present

## 2017-03-09 DIAGNOSIS — E1143 Type 2 diabetes mellitus with diabetic autonomic (poly)neuropathy: Secondary | ICD-10-CM | POA: Diagnosis present

## 2017-03-09 DIAGNOSIS — I472 Ventricular tachycardia: Secondary | ICD-10-CM | POA: Diagnosis present

## 2017-03-09 DIAGNOSIS — E1110 Type 2 diabetes mellitus with ketoacidosis without coma: Secondary | ICD-10-CM | POA: Diagnosis not present

## 2017-03-09 DIAGNOSIS — I129 Hypertensive chronic kidney disease with stage 1 through stage 4 chronic kidney disease, or unspecified chronic kidney disease: Secondary | ICD-10-CM | POA: Diagnosis present

## 2017-03-09 DIAGNOSIS — N400 Enlarged prostate without lower urinary tract symptoms: Secondary | ICD-10-CM | POA: Diagnosis present

## 2017-03-09 DIAGNOSIS — I2510 Atherosclerotic heart disease of native coronary artery without angina pectoris: Secondary | ICD-10-CM | POA: Diagnosis present

## 2017-03-09 DIAGNOSIS — E039 Hypothyroidism, unspecified: Secondary | ICD-10-CM | POA: Diagnosis present

## 2017-03-09 DIAGNOSIS — Z79899 Other long term (current) drug therapy: Secondary | ICD-10-CM | POA: Diagnosis not present

## 2017-03-09 DIAGNOSIS — I255 Ischemic cardiomyopathy: Secondary | ICD-10-CM | POA: Diagnosis present

## 2017-03-09 DIAGNOSIS — Z8619 Personal history of other infectious and parasitic diseases: Secondary | ICD-10-CM | POA: Diagnosis not present

## 2017-03-09 DIAGNOSIS — Z23 Encounter for immunization: Secondary | ICD-10-CM | POA: Diagnosis not present

## 2017-03-09 DIAGNOSIS — I959 Hypotension, unspecified: Secondary | ICD-10-CM | POA: Diagnosis not present

## 2017-03-09 DIAGNOSIS — E871 Hypo-osmolality and hyponatremia: Secondary | ICD-10-CM | POA: Diagnosis present

## 2017-03-09 DIAGNOSIS — Z7982 Long term (current) use of aspirin: Secondary | ICD-10-CM | POA: Diagnosis not present

## 2017-03-09 DIAGNOSIS — F329 Major depressive disorder, single episode, unspecified: Secondary | ICD-10-CM | POA: Diagnosis present

## 2017-03-09 DIAGNOSIS — Z7902 Long term (current) use of antithrombotics/antiplatelets: Secondary | ICD-10-CM | POA: Diagnosis not present

## 2017-03-09 DIAGNOSIS — Z955 Presence of coronary angioplasty implant and graft: Secondary | ICD-10-CM | POA: Diagnosis not present

## 2017-03-09 HISTORY — DX: Hyperglycemia, unspecified: R73.9

## 2017-03-09 LAB — BASIC METABOLIC PANEL
Anion gap: 5 (ref 5–15)
BUN: 17 mg/dL (ref 6–20)
CHLORIDE: 106 mmol/L (ref 101–111)
CO2: 23 mmol/L (ref 22–32)
Calcium: 8.3 mg/dL — ABNORMAL LOW (ref 8.9–10.3)
Creatinine, Ser: 1.94 mg/dL — ABNORMAL HIGH (ref 0.61–1.24)
GFR calc non Af Amer: 33 mL/min — ABNORMAL LOW (ref >60)
GFR, EST AFRICAN AMERICAN: 38 mL/min — AB (ref >60)
Glucose, Bld: 276 mg/dL — ABNORMAL HIGH (ref 65–99)
POTASSIUM: 3.9 mmol/L (ref 3.5–5.1)
SODIUM: 134 mmol/L — AB (ref 135–145)

## 2017-03-09 LAB — CBC
HEMATOCRIT: 32 % — AB (ref 39.0–52.0)
HEMOGLOBIN: 10.7 g/dL — AB (ref 13.0–17.0)
MCH: 28.7 pg (ref 26.0–34.0)
MCHC: 33.4 g/dL (ref 30.0–36.0)
MCV: 85.8 fL (ref 78.0–100.0)
Platelets: 147 10*3/uL — ABNORMAL LOW (ref 150–400)
RBC: 3.73 MIL/uL — AB (ref 4.22–5.81)
RDW: 12.3 % (ref 11.5–15.5)
WBC: 7.4 10*3/uL (ref 4.0–10.5)

## 2017-03-09 LAB — GLUCOSE, CAPILLARY
GLUCOSE-CAPILLARY: 212 mg/dL — AB (ref 65–99)
GLUCOSE-CAPILLARY: 299 mg/dL — AB (ref 65–99)
GLUCOSE-CAPILLARY: 176 mg/dL — AB (ref 65–99)
Glucose-Capillary: 313 mg/dL — ABNORMAL HIGH (ref 65–99)

## 2017-03-09 MED ORDER — INSULIN STARTER KIT- PEN NEEDLES (ENGLISH)
1.0000 | Freq: Once | Status: DC
  Filled 2017-03-09 (×2): qty 1

## 2017-03-09 MED ORDER — INSULIN GLARGINE 100 UNIT/ML ~~LOC~~ SOLN
12.0000 [IU] | Freq: Every day | SUBCUTANEOUS | Status: DC
  Administered 2017-03-09 – 2017-03-10 (×2): 12 [IU] via SUBCUTANEOUS
  Filled 2017-03-09 (×3): qty 0.12

## 2017-03-09 MED ORDER — LIVING WELL WITH DIABETES BOOK
Freq: Once | Status: AC
  Administered 2017-03-09: 1
  Filled 2017-03-09: qty 1

## 2017-03-09 MED ORDER — INSULIN STARTER KIT- SYRINGES (ENGLISH)
1.0000 | Freq: Once | Status: DC
  Filled 2017-03-09 (×2): qty 1

## 2017-03-09 MED ORDER — INFLUENZA VAC SPLIT HIGH-DOSE 0.5 ML IM SUSY
0.5000 mL | PREFILLED_SYRINGE | INTRAMUSCULAR | Status: AC
  Administered 2017-03-10: 0.5 mL via INTRAMUSCULAR
  Filled 2017-03-09: qty 0.5

## 2017-03-09 MED ORDER — INSULIN ASPART 100 UNIT/ML ~~LOC~~ SOLN
5.0000 [IU] | Freq: Three times a day (TID) | SUBCUTANEOUS | Status: DC
  Administered 2017-03-09 – 2017-03-10 (×3): 5 [IU] via SUBCUTANEOUS

## 2017-03-09 NOTE — Progress Notes (Signed)
PROGRESS NOTE    Grant [name]  WUJ:[id number] DOB: 1945/09/03 DOA: 03/08/2017 PCP: Myrlene Broker, MD   Brief Narrative: 71 y.o. male with pmh ICM, NSVT s/p ICD implantation 2007, CAD with prior stent, tobacco abuse, remote hx heavy etoh use, HLD, hypothyroidism and OSA. Last seen by cardiology on 01/19/17 for routine follow-up. At that visit, his Amiodarone and Losartan doses were decreased given orthostatis and complaints of fatigue. One week history of lightheadedness and dizziness. Wife checked blood sugar with her meter with readings 400-500's. He has been diagnosed as prediabetic in the past, but never on medications. Pt describes episode of nonradiating chest pain on Saturday relieved with 2 SL nitroglycerins. No recurrent chest pain. Denies recent fever, cough, sob, n/v. +polyuria, +polydipsia, some abdominal discomfort with 1-2 episodes of diarrhea over the weekend. Denies melena, hematochezia or hematemesis.   Patient denies any complaints of nausea, vomiting, abdominal pain. His wife is by the bedside. They would like to see the diabetic educator. Currently on sliding scale insulin. Last hemoglobin A1c was 12.4.   Assessment & Plan:   Active Problems:   Essential hypertension   Benign prostatic hyperplasia   Tobacco abuse   Hypothyroidism   CAD (coronary artery disease)   Hx of hepatitis C   Hyperglycemia, unspecified   ICD (implantable cardioverter-defibrillator) in place   Acute kidney injury (HCC)  Newly diagnosed type 2 diabetes patient probably did have diabetes out of prolonged period of time. Last hemoglobin A1c is 12.4. Currently on sliding scale insulin. Will start him on Lantus daily and NovoLog insulin prior to meals.  Acute kidney injury creatinine down to 1.94. Follow-up levels tomorrow.  Ischemic cardiomyopathy ejection fraction 55%. Currently off diuretics due to hyperglycemia and hypotension.  Hypertension ARB and diuretics on hold at this time  due to low blood pressure.  Hypothyroidism continue Synthroid  CAD/AICD continue amiodarone.  Obstructive sleep apnea not on CPAP.  NSVT continue amiodarone and Eliquis.  Hyponatremia sodium 134 improved since admission.  Anemia chronic stable.  Hyperlipidemia continue Lipitor.  Depression continue Lexapro.   BPH continue Hytrin.        DVT prophylaxis: Code Status:  Family Communication:  Disposition Plan:    Consultants  Procedures:  Antimicrobials:    Subjective:   Objective: Vitals:   03/08/17 2109 03/09/17 0602 03/09/17 0744 03/09/17 0839  BP: (!) 94/58 106/65  101/66  Pulse: (!) 59 62  (!) 54  Resp: Temp: 98.4 F (36.9 C) 98.6 F (37 C)  97.7 F (36.5 C)  TempSrc: Oral Oral  Oral  SpO2: 100% 100% 93% 100%  Weight: 106.4 kg (234 lb 9.1 oz)     Height:        Intake/Output Summary (Last 24 hours) at 03/09/17 1219 Last data filed at 03/09/17 0957  Gross per 24 hour  Intake             1920 ml  Output                0 ml  Net             1920 ml   Filed Weights   03/07/17 2309 03/08/17 2109  Weight: 106.6 kg (235 lb) 106.4 kg (234 lb 9.1 oz)    Examination:  General exam: Appears calm and comfortable  Respiratory system: Clear to auscultation. Respiratory effort normal. Cardiovascular system: S1 & S2 heard, RRR. No JVD, murmurs, rubs, gallops or clicks. No pedal edema.  Gastrointestinal system: Abdomen is nondistended, soft and nontender. No organomegaly or masses felt. Normal bowel sounds heard. Central nervous system: Alert and oriented. No focal neurological deficits. Extremities: Symmetric 5 x 5 power. Skin: No rashes, lesions or ulcers Psychiatry: Judgement and insight appear normal. Mood & affect appropriate.     Data Reviewed: I have personally reviewed following labs and imaging studies  CBC:  Recent Labs Lab 03/07/17 2320 03/08/17 1406 03/09/17 0436  WBC 8.5 7.1 7.4  HGB 13.1 12.4* 10.7*  HCT 38.8*  36.7* 32.0*  MCV 86.6 86.6 85.8  PLT 182 154 147*   Basic Metabolic Panel:  Recent Labs Lab 03/07/17 2320 03/08/17 1406 03/09/17 0436  NA 129* 133* 134*  K 4.4 3.9 3.9  CL 95* 103 106  CO2 GLUCOSE 453* 315* 276*  BUN 28* 26* 17  CREATININE 2.34* 1.98* 1.94*  CALCIUM 9.3 8.6* 8.3*   GFR: Estimated Creatinine Clearance: 45.4 mL/min (A) (by C-G formula based on SCr of 1.94 mg/dL (H)). Liver Function Tests:  Recent Labs Lab 03/08/17 1355  AST 17  ALT 16*  ALKPHOS 57  BILITOT 0.5  PROT 6.1*  ALBUMIN 3.7   No results for input(s): LIPASE, AMYLASE in the last 168 hours. No results for input(s): AMMONIA in the last 168 hours. Coagulation Profile:  Recent Labs Lab 03/08/17 0817  INR 1.13   Cardiac Enzymes:  Recent Labs Lab 03/08/17 0817  TROPONINI <0.03   BNP (last 3 results) No results for input(s): PROBNP in the last 8760 hours. HbA1C:  Recent Labs  03/08/17 0817  HGBA1C 12.4*   CBG:  Recent Labs Lab 03/08/17 1308 03/08/17 1710 03/08/17 2040 03/09/17 0750 03/09/17 1146  GLUCAP 335* 354* 307* 212* 299*   Lipid Profile: No results for input(s): CHOL, HDL, LDLCALC, TRIG, CHOLHDL, LDLDIRECT in the last 72 hours. Thyroid Function Tests: No results for input(s): TSH, T4TOTAL, FREET4, T3FREE, THYROIDAB in the last 72 hours. Anemia Panel: No results for input(s): VITAMINB12, FOLATE, FERRITIN, TIBC, IRON, RETICCTPCT in the last 72 hours. Sepsis Labs: No results for input(s): PROCALCITON, LATICACIDVEN in the last 168 hours.  No results found for this or any previous visit (from the past 240 hour(s)).       Radiology Studies: Acute Abdominal Series  Result Date: 03/08/2017 CLINICAL DATA:  Abdominal pain and diarrhea for several days. Dizziness, chest discomfort, and fatigue. EXAM: DG ABDOMEN ACUTE W/ 1V CHEST COMPARISON:  Chest radiograph on 09/10/2015 FINDINGS: There is no evidence of dilated bowel loops or free intraperitoneal air. No  radiopaque calculi or other significant radiographic abnormality is seen. Multiple surgical clips seen in the epigastric region. AICD remains in appropriate position. Heart size and mediastinal contours are within normal limits. Stable ectasia and atherosclerotic calcification of thoracic aorta. Both lungs are clear. IMPRESSION: Unremarkable bowel gas pattern. No active cardiopulmonary disease. Electronically Signed   By: Myles Rosenthal M.D.   On: 03/08/2017 12:11        Scheduled Meds: . albuterol  2.5 mg Inhalation BID  . amiodarone  200 mg Oral Daily  . apixaban  5 mg Oral BID  . aspirin EC  81 mg Oral Daily  . atorvastatin  40 mg Oral QHS  . cholecalciferol  2,000 Units Oral Daily  . escitalopram  10 mg Oral QHS  . fluticasone  2 spray Each Nare Daily  . [START ON 03/10/2017] Influenza vac split quadrivalent PF  0.5 mL Intramuscular Tomorrow-1000  . insulin aspart  0-15  Units Subcutaneous TID WC  . insulin aspart  0-5 Units Subcutaneous QHS  . levothyroxine  50 mcg Oral QAC breakfast  . metoCLOPramide (REGLAN) injection  10 mg Intravenous Q8H  . nicotine  14 mg Transdermal Daily  . pantoprazole  40 mg Oral Daily  . terazosin  5 mg Oral BID   Continuous Infusions: . sodium chloride 100 mL/hr at 03/09/17 0960     LOS: 0 days     Alwyn Ren, MD [facility]  If 7PM-7AM, please contact night-coverage [email or web address] Password [email or web address] 03/09/2017, 12:19 PM

## 2017-03-09 NOTE — Discharge Instructions (Signed)

## 2017-03-09 NOTE — Progress Notes (Signed)
Inpatient Diabetes Program Recommendations  AACE/ADA: New Consensus Statement on Inpatient Glycemic Control (2015)  Target Ranges:  Prepandial:   less than 140 mg/dL      Peak postprandial:   less than 180 mg/dL (1-2 hours)      Critically ill patients:  140 - 180 mg/dL   Lab Results  Component Value Date   GLUCAP 299 (H) 03/09/2017   HGBA1C 12.4 (H) 03/08/2017    Review of Glycemic Control Results for Grant [name], Grant [name] (MRN [id number]) as of 03/09/2017 12:28  Ref. Range 03/07/2017 23:24 03/08/2017 09:35 03/08/2017 13:08 03/08/2017 17:10 03/08/2017 20:40 03/09/2017 07:50 03/09/2017 11:46  Glucose-Capillary Latest Ref Range: 65 - 99 mg/dL 473 (H) 498 (H) 335 (H) 354 (H) 307 (H) 212 (H) 299 (H)   Diabetes history: Prediabetes Outpatient Diabetes medications: None Current orders for Inpatient glycemic control: Lantus 12 units + Novolog 5 units tid meal coverage + Novolog correction moderate tid = 0-5 units hs  Inpatient Diabetes Program Recommendations:  Spoke with pt @ bedside about new diagnosis. Discussed A1C 12.4 (average blood glucose 309 over the past 2-3 months) results with them and explained what an A1C is, basic pathophysiology of DM Type 2, basic home care, basic diabetes diet nutrition principles, importance of checking CBGs and maintaining good CBG control to prevent long-term and short-term complications. Reviewed signs and symptoms of hyperglycemia and hypoglycemia and how to treat hypoglycemia at home. Also reviewed blood sugar goals at home.  RNs to provide ongoing basic DM education at bedside with this patient. Have ordered educational booklet, insulin starter kit, and DM videos. Have also placed RD consult for DM diet education for this patient. Also placed consult to case manager to determine best option for insulin coverage. Patient gets most of his medications from the [place] and also has Medicare.  Patient will need @ D/C: Blood glucose meter kit [id number] Pen needles [id number] on  syringes 34052   Nurse Rick Duff allowed patient to give his own injection of Novolog correction and patient did well. Reviewed with patient site selection and rotation of sites. Will follow during hospitalization.  Thank you, Nani Gasser. Davison Ohms, RN, MSN, CDE  Diabetes Coordinator Inpatient Glycemic Control Team Team Pager [phone] (8am-5pm) 03/09/2017 1:11 PM

## 2017-03-09 NOTE — Plan of Care (Signed)
Problem: Health Behavior/Discharge Planning: Goal: Ability to manage health-related needs will improve Outcome: Progressing Patient aware of need to monitor blood sugars and take insulin. Will continue to educate patient on importance of blood sugar management.

## 2017-03-09 NOTE — Plan of Care (Signed)
Problem: Food- and Nutrition-Related Knowledge Deficit (NB-1.1) Goal: Nutrition education Formal process to instruct or train a patient/client in a skill or to impart knowledge to help patients/clients voluntarily manage or modify food choices and eating behavior to maintain or improve health.  Outcome: Completed/Met Date Met: 03/09/17  RD consulted for nutrition education regarding diabetes. Pt's wife present during education.   Lab Results  Component Value Date   HGBA1C 12.4 (H) 03/08/2017    RD provided "Carbohydrate Counting for People with Diabetes" handout from the Academy of [employer] Dietetics. Discussed different food groups and their effects on blood sugar, emphasizing carbohydrate-containing foods. Provided list of carbohydrates and recommended serving sizes of common foods.  Discussed importance of controlled and consistent carbohydrate intake throughout the day. Provided examples of ways to balance meals/snacks and encouraged intake of high-fiber, whole grain complex carbohydrates. Teach back method used.  Expect good compliance. Wife also has DM and is very knowledgeable regarding diabetic diet and is a good support for pt. Wife also does most of the grocery shopping and cooking  Body mass index is 29.32 kg/m.   Current diet order is Carb Modified, patient is consuming approximately 100% of meals at this time. Pt reports no weight loss PTA.  Labs and medications reviewed. No further nutrition interventions warranted at this time. RD contact information provided. If additional nutrition issues arise, please re-consult RD.  Kerman Passey MS, RD, LDN [phone] Pager  [phone] Weekend/On-Call Pager

## 2017-03-10 DIAGNOSIS — E1110 Type 2 diabetes mellitus with ketoacidosis without coma: Principal | ICD-10-CM

## 2017-03-10 LAB — BASIC METABOLIC PANEL
Anion gap: 6 (ref 5–15)
BUN: 11 mg/dL (ref 6–20)
CO2: 21 mmol/L — ABNORMAL LOW (ref 22–32)
Calcium: 8.1 mg/dL — ABNORMAL LOW (ref 8.9–10.3)
Chloride: 109 mmol/L (ref 101–111)
Creatinine, Ser: 1.43 mg/dL — ABNORMAL HIGH (ref 0.61–1.24)
GFR calc Af Amer: 55 mL/min — ABNORMAL LOW (ref >60)
GFR, EST NON AFRICAN AMERICAN: 47 mL/min — AB (ref >60)
GLUCOSE: 151 mg/dL — AB (ref 65–99)
Potassium: 4.3 mmol/L (ref 3.5–5.1)
Sodium: 136 mmol/L (ref 135–145)

## 2017-03-10 LAB — GLUCOSE, CAPILLARY
GLUCOSE-CAPILLARY: 275 mg/dL — AB (ref 65–99)
Glucose-Capillary: 142 mg/dL — ABNORMAL HIGH (ref 65–99)
Glucose-Capillary: 284 mg/dL — ABNORMAL HIGH (ref 65–99)

## 2017-03-10 MED ORDER — INSULIN STARTER KIT- SYRINGES (ENGLISH): 1.0000 | Freq: Once | 0 refills | Status: AC

## 2017-03-10 MED ORDER — "INSULIN SYRINGE 31G X 5/16"" 0.5 ML MISC": 16.0000 [IU] | Freq: Every day | 0 refills | Status: DC

## 2017-03-10 MED ORDER — INSULIN STARTER KIT- SYRINGES (ENGLISH)
1.0000 | Freq: Once | Status: AC
  Administered 2017-03-10: 1
  Filled 2017-03-10: qty 1

## 2017-03-10 MED ORDER — INSULIN ASPART 100 UNIT/ML ~~LOC~~ SOLN: 0.0000 [IU] | Freq: Three times a day (TID) | SUBCUTANEOUS | 11 refills | Status: DC

## 2017-03-10 MED ORDER — NICOTINE 14 MG/24HR TD PT24: 14.0000 mg | MEDICATED_PATCH | Freq: Every day | TRANSDERMAL | 0 refills | Status: DC

## 2017-03-10 MED ORDER — INSULIN ASPART 100 UNIT/ML ~~LOC~~ SOLN: 5.0000 [IU] | Freq: Three times a day (TID) | SUBCUTANEOUS | 11 refills | Status: DC

## 2017-03-10 MED ORDER — INSULIN STARTER KIT- PEN NEEDLES (ENGLISH): 1.0000 | Freq: Once | 0 refills | Status: DC

## 2017-03-10 MED ORDER — INSULIN GLARGINE 100 UNIT/ML ~~LOC~~ SOLN: 16.0000 [IU] | Freq: Every day | SUBCUTANEOUS | 3 refills | Status: DC

## 2017-03-10 MED ORDER — BLOOD GLUCOSE MONITOR KIT: PACK | 0 refills | Status: DC

## 2017-03-10 NOTE — Progress Notes (Signed)
Inpatient Diabetes Program Recommendations  AACE/ADA: New Consensus Statement on Inpatient Glycemic Control (2015)  Target Ranges:  Prepandial:   less than 140 mg/dL      Peak postprandial:   less than 180 mg/dL (1-2 hours)      Critically ill patients:  140 - 180 mg/dL   Lab Results  Component Value Date   GLUCAP 284 (H) 03/10/2017   HGBA1C 12.4 (H) 03/08/2017    Review of Glycemic Control  Inpatient Diabetes Program Recommendations:  Met with patient and wife @ bedside and reviewed insulin management of meal coverage, basal insulin, and hypoglycemia protocol. Patient feels comfortable giving injections. Patient and wife interested in outpatient diabetes education so placed [facility].  Patient needs @ D/C: Insulin syringes [id number] Blood glucose meter kit (includes lancets & strips) [id number]  Thank you, Nani Gasser. Daryon Remmert, RN, MSN, CDE  Diabetes Coordinator Inpatient Glycemic Control Team Team Pager [phone] (8am-5pm) 03/10/2017 12:14 PM

## 2017-03-10 NOTE — Discharge Summary (Signed)
Physician Discharge Summary  Grant [name] EXB:[id number] DOB: 1945/10/03 DOA: 03/08/2017  PCP: Hoyt Koch, MD  Admit date: 03/08/2017 Discharge date: 03/10/2017  Admitted From Home Disposition home  Recommendations for Outpatient Follow-up:  1. Follow up with PCP in 1-2 weeks 2. Please obtain BMP/CBC in one week  Home Health none Equipment/Devices none  Discharge Condition: Stable CODE STATUS: Full code Diet recommendation: Carb modified heart healthy diet  Brief/Interim Summary: 71 year old male admitted with new onset diagnosis of diabetes type 2. Patient's hemoglobin A1c was 12.4. He had a history of prediabetes. He was not on any medications at home for diabetes. He was admitted to the hospital treated with IV fluids started on long-acting insulin and NovoLog. His blood sugar stabilized with the above treatment. He is being discharged home on Lantus and NovoLog and to follow-up with primary care physician in the next 1-2 weeks. He was seen by diabetic coordinator as well as nutritional registered dietitian and has given him education and training about how to use insulin and how to plan meals with low carb diet.   Discharge Diagnoses:  Active Problems:   Essential hypertension   Benign prostatic hyperplasia   Tobacco abuse   Hypothyroidism   CAD (coronary artery disease)   Hx of hepatitis C   Hyperglycemia, unspecified   ICD (implantable cardioverter-defibrillator) in place   Acute kidney injury ([place])   Hyperglycemia Hyperglycemia with a hemoglobin A1c of 12.4 Diagnosis of type 2 diabetes newly diagnosed.. Acute on chronic kidney disease renal functions improved with IV hydration and holding his Lasix and ACE inhibitor. History of hypertension patient's blood pressure was running low to low normal during his hospital stay he did not receive any antihypertensives. Instead he was given IV fluids for dehydration. At the day of discharge his systolic blood  pressures above 90s to 100 I am not putting him back on his Lasix or ACE inhibitor's or spider no lack tone at this time. He will continue taking Hytrin for benign prostatic hypertrophy. He will follow-up with primary care physician for further guidance to manage his medications as well as insulin. Patient has been ambulating in the room without any falls. Patient's wife is in the room. I discussed with her and the patient.     Discharge Instructions follow-up with primary care physician in 1-2 weeks.   Allergies as of 03/10/2017      Reactions   Food    PT EATS NO LAMB OR GOAT.      Medication List    STOP taking these medications   acetaminophen 500 MG tablet Commonly known as:  TYLENOL   albuterol 108 (90 Base) MCG/ACT inhaler Commonly known as:  PROVENTIL HFA;VENTOLIN HFA   furosemide 20 MG tablet Commonly known as:  LASIX   losartan 100 MG tablet Commonly known as:  COZAAR   losartan 50 MG tablet Commonly known as:  COZAAR   nitroGLYCERIN 0.4 MG SL tablet Commonly known as:  NITROSTAT   omeprazole 20 MG capsule Commonly known as:  PRILOSEC   potassium chloride SA 20 MEQ tablet Commonly known as:  K-DUR,KLOR-CON   prazosin 2 MG capsule Commonly known as:  MINIPRESS   spironolactone 25 MG tablet Commonly known as:  ALDACTONE     TAKE these medications   amiodarone 200 MG tablet Commonly known as:  PACERONE Take 200 mg by mouth daily.   aspirin EC 81 MG tablet Take 81 mg by mouth daily.   atorvastatin 40 MG tablet Commonly  known as:  LIPITOR Take 40 mg by mouth at bedtime.   CINNAMON PO Take 1,000 mg by mouth 2 (two) times daily.   cyclobenzaprine 10 MG tablet Commonly known as:  FLEXERIL Take 10 mg by mouth at bedtime as needed for muscle spasms. What changed:  Another medication with the same name was removed. Continue taking this medication, and follow the directions you see here.   ELIQUIS 5 MG Tabs tablet Generic drug:  apixaban Take 5 mg by  mouth 2 (two) times daily.   escitalopram 10 MG tablet Commonly known as:  LEXAPRO Take 10 mg by mouth at bedtime.   fluticasone 50 MCG/ACT nasal spray Commonly known as:  FLONASE Place 2 sprays into both nostrils daily.   HYDROcodone-acetaminophen 5-325 MG tablet Commonly known as:  NORCO/VICODIN Take 1 tablet by mouth 3 (three) times daily as needed for moderate pain.   insulin aspart 100 UNIT/ML injection Commonly known as:  novoLOG Inject 0-15 Units into the skin 3 (three) times daily with meals.   insulin aspart 100 UNIT/ML injection Commonly known as:  novoLOG Inject 5 Units into the skin 3 (three) times daily with meals.   insulin glargine 100 UNIT/ML injection Commonly known as:  LANTUS Inject 0.16 mLs (16 Units total) into the skin at bedtime.   insulin starter kit- pen needles Misc 1 kit by Other route once.   insulin starter kit- syringes Misc 1 kit by Other route once.   levothyroxine 50 MCG tablet Commonly known as:  SYNTHROID, LEVOTHROID Take 50 mcg by mouth daily before breakfast.   nicotine 14 mg/24hr patch Commonly known as:  NICODERM CQ - dosed in mg/24 hours Place 1 patch (14 mg total) onto the skin daily.   terazosin 5 MG capsule Commonly known as:  HYTRIN Take 1 capsule (5 mg total) by mouth 2 (two) times daily.   Vitamin D3 2000 units Tabs Take 2,000 Units by mouth daily.            Discharge Care Instructions        Start     Ordered   03/10/17 0000  insulin aspart (NOVOLOG) 100 UNIT/ML injection  3 times daily with meals     03/10/17 1006   03/10/17 0000  insulin aspart (NOVOLOG) 100 UNIT/ML injection  3 times daily with meals     03/10/17 1006   03/10/17 0000  insulin starter kit- pen needles MISC   Once     03/10/17 1006   03/10/17 0000  insulin starter kit- syringes MISC   Once     03/10/17 1006   03/10/17 0000  nicotine (NICODERM CQ - DOSED IN MG/24 HOURS) 14 mg/24hr patch  Daily     03/10/17 1006   03/10/17 0000  insulin  glargine (LANTUS) 100 UNIT/ML injection  Daily at bedtime     03/10/17 1006      Allergies  Allergen Reactions  . Food     PT EATS NO LAMB OR GOAT.    Consultatiotion none    Procedures/Studies: Acute Abdominal Series  Result Date: 03/08/2017 CLINICAL DATA:  Abdominal pain and diarrhea for several days. Dizziness, chest discomfort, and fatigue. EXAM: DG ABDOMEN ACUTE W/ 1V CHEST COMPARISON:  Chest radiograph on 09/10/2015 FINDINGS: There is no evidence of dilated bowel loops or free intraperitoneal air. No radiopaque calculi or other significant radiographic abnormality is seen. Multiple surgical clips seen in the epigastric region. AICD remains in appropriate position. Heart size and mediastinal contours are within  normal limits. Stable ectasia and atherosclerotic calcification of thoracic aorta. Both lungs are clear. IMPRESSION: Unremarkable bowel gas pattern. No active cardiopulmonary disease. Electronically Signed   By: Earle Gell M.D.   On: 03/08/2017 12:11    (Echo, Carotid, EGD, Colonoscopy, ERCP)    Subjective:   Discharge Exam: Vitals:   03/10/17 0612 03/10/17 0900  BP: 105/68 (!) 91/58  Pulse: 66 62  Resp: 18 19  Temp: 98.2 F (36.8 C) 97.7 F (36.5 C)  SpO2: 95% 96%   Vitals:   03/09/17 1916 03/09/17 2034 03/10/17 0612 03/10/17 0900  BP:  (!) 99/55 105/68 (!) 91/58  Pulse:  65 66 62  Resp:  '19 18 19  ' Temp:  98.5 F (36.9 C) 98.2 F (36.8 C) 97.7 F (36.5 C)  TempSrc:  Oral Oral Oral  SpO2: 99% 93% 95% 96%  Weight:  106.5 kg (234 lb 12.6 oz)    Height:        General: Pt is alert, awake, not in acute distress Cardiovascular: RRR, S1/S2 +, no rubs, no gallops Respiratory: CTA bilaterally, no wheezing, no rhonchi Abdominal: Soft, NT, ND, bowel sounds + Extremities: no edema, no cyanosis    The results of significant diagnostics from this hospitalization (including imaging, microbiology, ancillary and laboratory) are listed below for reference.      Microbiology: No results found for this or any previous visit (from the past 240 hour(s)).   Labs: BNP (last 3 results) No results for input(s): BNP in the last 8760 hours. Basic Metabolic Panel:  Recent Labs Lab 03/07/17 2320 03/08/17 1406 03/09/17 0436 03/10/17 0221  NA 129* 133* 134* 136  K 4.4 3.9 3.9 4.3  CL 95* 103 106 109  CO2 '24 22 23 ' 21*  GLUCOSE 453* 315* 276* 151*  BUN 28* 26* 17 11  CREATININE 2.34* 1.98* 1.94* 1.43*  CALCIUM 9.3 8.6* 8.3* 8.1*   Liver Function Tests:  Recent Labs Lab 03/08/17 1355  AST 17  ALT 16*  ALKPHOS 57  BILITOT 0.5  PROT 6.1*  ALBUMIN 3.7   No results for input(s): LIPASE, AMYLASE in the last 168 hours. No results for input(s): AMMONIA in the last 168 hours. CBC:  Recent Labs Lab 03/07/17 2320 03/08/17 1406 03/09/17 0436  WBC 8.5 7.1 7.4  HGB 13.1 12.4* 10.7*  HCT 38.8* 36.7* 32.0*  MCV 86.6 86.6 85.8  PLT 182 154 147*   Cardiac Enzymes:  Recent Labs Lab 03/08/17 0817  TROPONINI <0.03   BNP: Invalid input(s): POCBNP CBG:  Recent Labs Lab 03/09/17 0750 03/09/17 1146 03/09/17 1650 03/09/17 2033 03/10/17 0756  GLUCAP 212* 299* 313* 176* 142*   D-Dimer No results for input(s): DDIMER in the last 72 hours. Hgb A1c  Recent Labs  03/08/17 0817  HGBA1C 12.4*   Lipid Profile No results for input(s): CHOL, HDL, LDLCALC, TRIG, CHOLHDL, LDLDIRECT in the last 72 hours. Thyroid function studies No results for input(s): TSH, T4TOTAL, T3FREE, THYROIDAB in the last 72 hours.  Invalid input(s): FREET3 Anemia work up No results for input(s): VITAMINB12, FOLATE, FERRITIN, TIBC, IRON, RETICCTPCT in the last 72 hours. Urinalysis    Component Value Date/Time   COLORURINE STRAW (A) 03/07/2017 2340   APPEARANCEUR CLEAR 03/07/2017 2340   LABSPEC 1.011 03/07/2017 2340   PHURINE 5.0 03/07/2017 2340   GLUCOSEU >=500 (A) 03/07/2017 2340   HGBUR NEGATIVE 03/07/2017 2340   BILIRUBINUR NEGATIVE 03/07/2017 2340    BILIRUBINUR neg 07/05/2014 1344   KETONESUR NEGATIVE 03/07/2017 2340  PROTEINUR NEGATIVE 03/07/2017 2340   UROBILINOGEN 0.2 07/05/2014 1344   NITRITE NEGATIVE 03/07/2017 2340   LEUKOCYTESUR NEGATIVE 03/07/2017 2340   Sepsis Labs Invalid input(s): PROCALCITONIN,  WBC,  LACTICIDVEN Microbiology No results found for this or any previous visit (from the past 240 hour(s)).   Time coordinating discharge: Over 30 minutes  SIGNED:   Georgette Shell, MD  [facility] 03/10/2017, 10:07 AM  If 7PM-7AM, please contact night-coverage [email or web address] Password [email or web address]

## 2017-03-10 NOTE — Progress Notes (Signed)
Patient teaching included instructions and briefing about Type 2 Diabetes. Signs and symptoms of hypo/hyperglycemia. How to correctly administer insulin and monitor blood glucose levels.

## 2017-03-10 NOTE — Progress Notes (Signed)
Patient filled insulin syringe with insulin bottle and self administered insulin while RN observed for both breakfast and lunch SSI. Patient's wife was also present.  Both patient and wife verbalized they were comfortable administering insulin.

## 2017-03-11 ENCOUNTER — Telehealth: Payer: Self-pay | Admitting: *Deleted

## 2017-03-11 DIAGNOSIS — I2510 Atherosclerotic heart disease of native coronary artery without angina pectoris: Secondary | ICD-10-CM | POA: Diagnosis not present

## 2017-03-11 DIAGNOSIS — Z95810 Presence of automatic (implantable) cardiac defibrillator: Secondary | ICD-10-CM | POA: Diagnosis not present

## 2017-03-11 DIAGNOSIS — E1165 Type 2 diabetes mellitus with hyperglycemia: Secondary | ICD-10-CM | POA: Diagnosis not present

## 2017-03-11 DIAGNOSIS — F17210 Nicotine dependence, cigarettes, uncomplicated: Secondary | ICD-10-CM | POA: Diagnosis not present

## 2017-03-11 DIAGNOSIS — Z7982 Long term (current) use of aspirin: Secondary | ICD-10-CM | POA: Diagnosis not present

## 2017-03-11 DIAGNOSIS — N400 Enlarged prostate without lower urinary tract symptoms: Secondary | ICD-10-CM | POA: Diagnosis not present

## 2017-03-11 DIAGNOSIS — G4733 Obstructive sleep apnea (adult) (pediatric): Secondary | ICD-10-CM | POA: Diagnosis not present

## 2017-03-11 DIAGNOSIS — Z794 Long term (current) use of insulin: Secondary | ICD-10-CM | POA: Diagnosis not present

## 2017-03-11 DIAGNOSIS — E039 Hypothyroidism, unspecified: Secondary | ICD-10-CM | POA: Diagnosis not present

## 2017-03-11 DIAGNOSIS — B1920 Unspecified viral hepatitis C without hepatic coma: Secondary | ICD-10-CM | POA: Diagnosis not present

## 2017-03-11 DIAGNOSIS — I10 Essential (primary) hypertension: Secondary | ICD-10-CM | POA: Diagnosis not present

## 2017-03-11 NOTE — Consult Note (Signed)
           [facility] CM Primary Care Navigator  03/11/2017  Grant [name] 06-28-1945 [id number]   Attempt to seepatient at the bedsideto identify possible discharge needs but he was already discharged. Patient was discharged homeper staff report.  Primary care provider's office called Valentina Gu Brand)to notify of patient's discharge and needfor post hospital follow-up and transition of care. Valentina Gu was notified ofpatient's health issues needing follow-up (mainly for new onset diagnosis of DM II).   Made aware to refer patient to [facility] care management ifdeemed appropriateand necessary for further services on his follow-up visit.   For questions, please contact:  Wyatt Haste, BSN, RN- [facility] Primary Care Navigator  Telephone: [phone] [facility]

## 2017-03-11 NOTE — Telephone Encounter (Signed)
Transition Care Management Follow-up Telephone Call   Date discharged? 03/10/17   How have you been since you were released from the hospital? Pt states he seem to be doing ok   Do you understand why you were in the hospital? YES   Do you understand the discharge instructions? YES   Where were you discharged to? Home   Items Reviewed:  Medications reviewed: YES  Allergies reviewed: YES  Dietary changes reviewed: YES, diabetic & carb modified diet  Referrals reviewed: No referral needed   Functional Questionnaire:  Activities of Daily Living (ADLs):   He states he are independent in the following: ambulation, bathing and hygiene, feeding, continence, grooming, toileting and dressing States he doesn't  require assistance   Any transportation issues/concerns?: NO   Any patient concern" NO   Confirmed importance and date/time of follow-up visits scheduled YES, appt 03/17/17  Provider Appointment booked with Dr. Okey Dupre  Confirmed with patient if condition begins to worsen call PCP or go to the ER.  Patient was given the office number and encouraged to call back with question or concerns.  : YES

## 2017-03-15 DIAGNOSIS — E1165 Type 2 diabetes mellitus with hyperglycemia: Secondary | ICD-10-CM | POA: Diagnosis not present

## 2017-03-15 DIAGNOSIS — I10 Essential (primary) hypertension: Secondary | ICD-10-CM | POA: Diagnosis not present

## 2017-03-15 DIAGNOSIS — E039 Hypothyroidism, unspecified: Secondary | ICD-10-CM | POA: Diagnosis not present

## 2017-03-15 DIAGNOSIS — I2510 Atherosclerotic heart disease of native coronary artery without angina pectoris: Secondary | ICD-10-CM | POA: Diagnosis not present

## 2017-03-15 DIAGNOSIS — B1920 Unspecified viral hepatitis C without hepatic coma: Secondary | ICD-10-CM | POA: Diagnosis not present

## 2017-03-15 DIAGNOSIS — G4733 Obstructive sleep apnea (adult) (pediatric): Secondary | ICD-10-CM | POA: Diagnosis not present

## 2017-03-17 ENCOUNTER — Inpatient Hospital Stay: Payer: Medicare Other | Admitting: Internal Medicine

## 2017-03-18 DIAGNOSIS — I2510 Atherosclerotic heart disease of native coronary artery without angina pectoris: Secondary | ICD-10-CM | POA: Diagnosis not present

## 2017-03-18 DIAGNOSIS — G4733 Obstructive sleep apnea (adult) (pediatric): Secondary | ICD-10-CM | POA: Diagnosis not present

## 2017-03-18 DIAGNOSIS — E039 Hypothyroidism, unspecified: Secondary | ICD-10-CM | POA: Diagnosis not present

## 2017-03-18 DIAGNOSIS — B1920 Unspecified viral hepatitis C without hepatic coma: Secondary | ICD-10-CM | POA: Diagnosis not present

## 2017-03-18 DIAGNOSIS — I10 Essential (primary) hypertension: Secondary | ICD-10-CM | POA: Diagnosis not present

## 2017-03-18 DIAGNOSIS — E1165 Type 2 diabetes mellitus with hyperglycemia: Secondary | ICD-10-CM | POA: Diagnosis not present

## 2017-03-21 DIAGNOSIS — E039 Hypothyroidism, unspecified: Secondary | ICD-10-CM | POA: Diagnosis not present

## 2017-03-21 DIAGNOSIS — I2510 Atherosclerotic heart disease of native coronary artery without angina pectoris: Secondary | ICD-10-CM | POA: Diagnosis not present

## 2017-03-21 DIAGNOSIS — G4733 Obstructive sleep apnea (adult) (pediatric): Secondary | ICD-10-CM | POA: Diagnosis not present

## 2017-03-21 DIAGNOSIS — B1920 Unspecified viral hepatitis C without hepatic coma: Secondary | ICD-10-CM | POA: Diagnosis not present

## 2017-03-21 DIAGNOSIS — I10 Essential (primary) hypertension: Secondary | ICD-10-CM | POA: Diagnosis not present

## 2017-03-21 DIAGNOSIS — E1165 Type 2 diabetes mellitus with hyperglycemia: Secondary | ICD-10-CM | POA: Diagnosis not present

## 2017-03-28 DIAGNOSIS — I10 Essential (primary) hypertension: Secondary | ICD-10-CM | POA: Diagnosis not present

## 2017-03-28 DIAGNOSIS — B1920 Unspecified viral hepatitis C without hepatic coma: Secondary | ICD-10-CM | POA: Diagnosis not present

## 2017-03-28 DIAGNOSIS — G4733 Obstructive sleep apnea (adult) (pediatric): Secondary | ICD-10-CM | POA: Diagnosis not present

## 2017-03-28 DIAGNOSIS — E039 Hypothyroidism, unspecified: Secondary | ICD-10-CM | POA: Diagnosis not present

## 2017-03-28 DIAGNOSIS — I2510 Atherosclerotic heart disease of native coronary artery without angina pectoris: Secondary | ICD-10-CM | POA: Diagnosis not present

## 2017-03-28 DIAGNOSIS — E1165 Type 2 diabetes mellitus with hyperglycemia: Secondary | ICD-10-CM | POA: Diagnosis not present

## 2017-03-31 DIAGNOSIS — I10 Essential (primary) hypertension: Secondary | ICD-10-CM | POA: Diagnosis not present

## 2017-03-31 DIAGNOSIS — I2510 Atherosclerotic heart disease of native coronary artery without angina pectoris: Secondary | ICD-10-CM | POA: Diagnosis not present

## 2017-03-31 DIAGNOSIS — E039 Hypothyroidism, unspecified: Secondary | ICD-10-CM | POA: Diagnosis not present

## 2017-03-31 DIAGNOSIS — B1920 Unspecified viral hepatitis C without hepatic coma: Secondary | ICD-10-CM | POA: Diagnosis not present

## 2017-03-31 DIAGNOSIS — E1165 Type 2 diabetes mellitus with hyperglycemia: Secondary | ICD-10-CM | POA: Diagnosis not present

## 2017-03-31 DIAGNOSIS — G4733 Obstructive sleep apnea (adult) (pediatric): Secondary | ICD-10-CM | POA: Diagnosis not present

## 2017-04-04 DIAGNOSIS — B1920 Unspecified viral hepatitis C without hepatic coma: Secondary | ICD-10-CM | POA: Diagnosis not present

## 2017-04-04 DIAGNOSIS — G4733 Obstructive sleep apnea (adult) (pediatric): Secondary | ICD-10-CM | POA: Diagnosis not present

## 2017-04-04 DIAGNOSIS — E039 Hypothyroidism, unspecified: Secondary | ICD-10-CM | POA: Diagnosis not present

## 2017-04-04 DIAGNOSIS — E1165 Type 2 diabetes mellitus with hyperglycemia: Secondary | ICD-10-CM | POA: Diagnosis not present

## 2017-04-04 DIAGNOSIS — I2510 Atherosclerotic heart disease of native coronary artery without angina pectoris: Secondary | ICD-10-CM | POA: Diagnosis not present

## 2017-04-04 DIAGNOSIS — I10 Essential (primary) hypertension: Secondary | ICD-10-CM | POA: Diagnosis not present

## 2017-04-14 DIAGNOSIS — E1165 Type 2 diabetes mellitus with hyperglycemia: Secondary | ICD-10-CM | POA: Diagnosis not present

## 2017-04-14 DIAGNOSIS — E039 Hypothyroidism, unspecified: Secondary | ICD-10-CM | POA: Diagnosis not present

## 2017-04-14 DIAGNOSIS — I2510 Atherosclerotic heart disease of native coronary artery without angina pectoris: Secondary | ICD-10-CM | POA: Diagnosis not present

## 2017-04-14 DIAGNOSIS — B1920 Unspecified viral hepatitis C without hepatic coma: Secondary | ICD-10-CM | POA: Diagnosis not present

## 2017-04-14 DIAGNOSIS — I10 Essential (primary) hypertension: Secondary | ICD-10-CM | POA: Diagnosis not present

## 2017-04-14 DIAGNOSIS — G4733 Obstructive sleep apnea (adult) (pediatric): Secondary | ICD-10-CM | POA: Diagnosis not present

## 2017-04-21 DIAGNOSIS — B1920 Unspecified viral hepatitis C without hepatic coma: Secondary | ICD-10-CM | POA: Diagnosis not present

## 2017-04-21 DIAGNOSIS — E039 Hypothyroidism, unspecified: Secondary | ICD-10-CM | POA: Diagnosis not present

## 2017-04-21 DIAGNOSIS — E1165 Type 2 diabetes mellitus with hyperglycemia: Secondary | ICD-10-CM | POA: Diagnosis not present

## 2017-04-21 DIAGNOSIS — I2510 Atherosclerotic heart disease of native coronary artery without angina pectoris: Secondary | ICD-10-CM | POA: Diagnosis not present

## 2017-04-21 DIAGNOSIS — I10 Essential (primary) hypertension: Secondary | ICD-10-CM | POA: Diagnosis not present

## 2017-04-21 DIAGNOSIS — G4733 Obstructive sleep apnea (adult) (pediatric): Secondary | ICD-10-CM | POA: Diagnosis not present

## 2017-05-02 DIAGNOSIS — G4733 Obstructive sleep apnea (adult) (pediatric): Secondary | ICD-10-CM | POA: Diagnosis not present

## 2017-05-02 DIAGNOSIS — E039 Hypothyroidism, unspecified: Secondary | ICD-10-CM | POA: Diagnosis not present

## 2017-05-02 DIAGNOSIS — I10 Essential (primary) hypertension: Secondary | ICD-10-CM | POA: Diagnosis not present

## 2017-05-02 DIAGNOSIS — B1920 Unspecified viral hepatitis C without hepatic coma: Secondary | ICD-10-CM | POA: Diagnosis not present

## 2017-05-02 DIAGNOSIS — I2510 Atherosclerotic heart disease of native coronary artery without angina pectoris: Secondary | ICD-10-CM | POA: Diagnosis not present

## 2017-05-02 DIAGNOSIS — E1165 Type 2 diabetes mellitus with hyperglycemia: Secondary | ICD-10-CM | POA: Diagnosis not present

## 2017-05-17 ENCOUNTER — Ambulatory Visit (INDEPENDENT_AMBULATORY_CARE_PROVIDER_SITE_OTHER): Payer: Medicare Other | Admitting: *Deleted

## 2017-05-17 DIAGNOSIS — I472 Ventricular tachycardia, unspecified: Secondary | ICD-10-CM

## 2017-05-18 NOTE — Progress Notes (Signed)
Remote ICD transmission.   

## 2017-05-20 ENCOUNTER — Encounter: Payer: Self-pay | Admitting: Cardiology

## 2017-05-26 ENCOUNTER — Other Ambulatory Visit: Payer: Self-pay

## 2017-05-30 LAB — CUP PACEART REMOTE DEVICE CHECK
Brady Statistic RV Percent Paced: 0 %
Date Time Interrogation Session: 20181127151200
HIGH POWER IMPEDANCE MEASURED VALUE: 50 Ohm
Implantable Lead Implant Date: 20070423
Lead Channel Impedance Value: 533 Ohm
Lead Channel Pacing Threshold Amplitude: 1.2 V
Lead Channel Setting Pacing Pulse Width: 0.4 ms
MDC IDC LEAD LOCATION: 753860
MDC IDC LEAD SERIAL: 23456
MDC IDC MSMT BATTERY REMAINING LONGEVITY: 90 mo
MDC IDC MSMT BATTERY REMAINING PERCENTAGE: 95 %
MDC IDC MSMT LEADCHNL RV PACING THRESHOLD PULSEWIDTH: 0.4 ms
MDC IDC PG IMPLANT DT: 20070423
MDC IDC SET LEADCHNL RV PACING AMPLITUDE: 2.4 V
MDC IDC SET LEADCHNL RV SENSING SENSITIVITY: 0.6 mV
Pulse Gen Serial Number: 102535

## 2017-08-16 ENCOUNTER — Ambulatory Visit (INDEPENDENT_AMBULATORY_CARE_PROVIDER_SITE_OTHER): Payer: Medicare Other | Admitting: *Deleted

## 2017-08-16 ENCOUNTER — Telehealth: Payer: Self-pay | Admitting: Cardiology

## 2017-08-16 DIAGNOSIS — I472 Ventricular tachycardia, unspecified: Secondary | ICD-10-CM

## 2017-08-16 NOTE — Telephone Encounter (Signed)
Spoke with pt and reminded pt of remote transmission that is due today. Pt verbalized understanding.   

## 2017-08-17 NOTE — Progress Notes (Signed)
Remote ICD transmission.   

## 2017-08-18 ENCOUNTER — Encounter: Payer: Self-pay | Admitting: Cardiology

## 2017-09-02 LAB — CUP PACEART REMOTE DEVICE CHECK
Battery Remaining Longevity: 90 mo
Date Time Interrogation Session: 20190226153600
HighPow Impedance: 59 Ohm
Implantable Lead Implant Date: 20070423
Implantable Lead Location: 753860
Implantable Lead Model: 185
Implantable Lead Serial Number: 23456
Implantable Pulse Generator Implant Date: 20070423
Lead Channel Pacing Threshold Amplitude: 1.2 V
Lead Channel Pacing Threshold Pulse Width: 0.4 ms
MDC IDC MSMT BATTERY REMAINING PERCENTAGE: 92 %
MDC IDC MSMT LEADCHNL RV IMPEDANCE VALUE: 578 Ohm
MDC IDC SET LEADCHNL RV PACING AMPLITUDE: 2.4 V
MDC IDC SET LEADCHNL RV PACING PULSEWIDTH: 0.4 ms
MDC IDC SET LEADCHNL RV SENSING SENSITIVITY: 0.6 mV
MDC IDC STAT BRADY RV PERCENT PACED: 0 %
Pulse Gen Serial Number: 102535

## 2017-09-27 ENCOUNTER — Encounter: Payer: Self-pay | Admitting: Sports Medicine

## 2017-09-27 ENCOUNTER — Ambulatory Visit (INDEPENDENT_AMBULATORY_CARE_PROVIDER_SITE_OTHER): Payer: Medicare Other | Admitting: Sports Medicine

## 2017-09-27 DIAGNOSIS — M79674 Pain in right toe(s): Secondary | ICD-10-CM | POA: Diagnosis not present

## 2017-09-27 DIAGNOSIS — E1142 Type 2 diabetes mellitus with diabetic polyneuropathy: Secondary | ICD-10-CM | POA: Diagnosis not present

## 2017-09-27 DIAGNOSIS — B351 Tinea unguium: Secondary | ICD-10-CM | POA: Diagnosis not present

## 2017-09-27 DIAGNOSIS — M79675 Pain in left toe(s): Secondary | ICD-10-CM | POA: Diagnosis not present

## 2017-09-27 NOTE — Progress Notes (Signed)
Subjective: Grant [name] is a 72 y.o. male patient with history of diabetes who presents to office today complaining of long,mildly painful nails  while ambulating in shoes; unable to trim. Patient states that the glucose reading this morning was 172 mg/dl. Patient denies any new changes in medication or new problems besides burning or tingling in the legs/feet was diagnosed with neuropathy 2 months ago and sweatiness. No other issues.   A1c 12.4   Review of Systems  Neurological: Positive for tingling.  All other systems reviewed and are negative.    Patient Active Problem List   Diagnosis Date Noted  . Hyperglycemia 03/09/2017  . Hyperglycemia, unspecified 03/08/2017  . ICD (implantable cardioverter-defibrillator) in place 03/08/2017  . Acute kidney injury ([place]) 03/08/2017  . Abdominal pain   . Diabetes mellitus with complication ([place])   . Viral URI with cough 09/10/2015  . Upper airway cough syndrome 06/05/2015  . Hx of hepatitis C 05/06/2015  . Muscle cramp 05/06/2015  . Diet-controlled diabetes mellitus ([place]) 06/04/2014  . Essential hypertension 06/04/2014  . Benign prostatic hyperplasia 06/04/2014  . Tobacco abuse 06/04/2014  . Osteoarthritis 06/04/2014  . Hypothyroidism 06/04/2014  . CAD (coronary artery disease) 06/04/2014   Current Outpatient Medications on File Prior to Visit  Medication Sig Dispense Refill  . amiodarone (PACERONE) 200 MG tablet Take 200 mg by mouth daily.    Marland Kitchen apixaban (ELIQUIS) 5 MG TABS tablet Take 5 mg by mouth 2 (two) times daily.    Marland Kitchen aspirin EC 81 MG tablet Take 81 mg by mouth daily.    Marland Kitchen atorvastatin (LIPITOR) 40 MG tablet Take 40 mg by mouth at bedtime.     . blood glucose meter kit and supplies KIT Dispense based on patient and insurance preference. Use up to four times daily as directed. (FOR ICD-9 250.00, 250.01). 1 each 0  . Cholecalciferol (VITAMIN D3) 2000 units TABS Take 2,000 Units by mouth daily.    Marland Kitchen CINNAMON PO Take 1,000  mg by mouth 2 (two) times daily.    . cyclobenzaprine (FLEXERIL) 10 MG tablet Take 10 mg by mouth at bedtime as needed for muscle spasms.    Marland Kitchen escitalopram (LEXAPRO) 10 MG tablet Take 10 mg by mouth at bedtime.    . fluticasone (FLONASE) 50 MCG/ACT nasal spray Place 2 sprays into both nostrils daily. 16 g 2  . HYDROcodone-acetaminophen (NORCO/VICODIN) 5-325 MG tablet Take 1 tablet by mouth 3 (three) times daily as needed for moderate pain.     Marland Kitchen insulin aspart (NOVOLOG) 100 UNIT/ML injection Inject 5 Units into the skin 3 (three) times daily with meals. 10 mL 11  . insulin aspart (NOVOLOG) 100 UNIT/ML injection Inject 0-15 Units into the skin 3 (three) times daily with meals. 10 mL 11  . Insulin Syringe-Needle U-100 (INSULIN SYRINGE .5CC/31GX5/16") 31G X 5/16" 0.5 ML MISC 16 Units by Does not apply route at bedtime. 200 each 0  . levothyroxine (SYNTHROID, LEVOTHROID) 50 MCG tablet Take 50 mcg by mouth daily before breakfast.    . nicotine (NICODERM CQ - DOSED IN MG/24 HOURS) 14 mg/24hr patch Place 1 patch (14 mg total) onto the skin daily. 28 patch 0  . terazosin (HYTRIN) 5 MG capsule Take 1 capsule (5 mg total) by mouth 2 (two) times daily. 180 capsule 3  . insulin glargine (LANTUS) 100 UNIT/ML injection Inject 0.16 mLs (16 Units total) into the skin at bedtime. 10 mL 3   No current facility-administered medications on file prior to  visit.    Allergies  Allergen Reactions  . Food     PT EATS NO LAMB OR GOAT.    Recent Results (from the past 2160 hour(s))  CUP PACEART REMOTE DEVICE CHECK     Status: None   Collection Time: 08/16/17  3:36 PM  Result Value Ref Range   Date Time Interrogation Session [id number]    Pulse Generator Manufacturer BOST    Pulse Gen Model E161 INCEPTA VR    Pulse Gen Serial Number [id number]    Clinic Name [facility]    Implantable Pulse Generator Type Implantable Cardiac Defibulator    Implantable Pulse Generator Implant Date 09233007    Implantable  Lead Manufacturer GUIC    Implantable Lead Model 0185 [place] Reliance G    Implantable Lead Serial Number [id number]    Implantable Lead Implant Date 62263335    Implantable Lead Location Detail 1 UNKNOWN    Implantable Lead Location [id number]    Lead Channel Setting Sensing Sensitivity 0.6 mV   Lead Channel Setting Sensing Adaptation Mode Adaptive Sensing    Lead Channel Setting Pacing Pulse Width 0.4 ms   Lead Channel Setting Pacing Amplitude 2.4 V   Lead Channel Impedance Value 578 ohm   Lead Channel Pacing Threshold Amplitude 1.2 V   Lead Channel Pacing Threshold Pulse Width 0.4 ms   HighPow Impedance 59 ohm   Battery Status BOS    Battery Remaining Longevity 90 mo   Battery Remaining Percentage 92 %   Brady Statistic RV Percent Paced 0 %   Eval Rhythm VS     Objective: General: Patient is awake, alert, and oriented x 3 and in no acute distress.  Integument: Skin is warm, dry and supple bilateral. Nails are tender, long, thickened and  dystrophic with subungual debris, consistent with onychomycosis, 1-5 bilateral. No signs of infection. No open lesions or preulcerative lesions present bilateral. Remaining integument unremarkable.  Vasculature:  Dorsalis Pedis pulse 1/4 bilateral. Posterior Tibial pulse  1/4 bilateral.  Capillary fill time <3 sec 1-5 bilateral. Positive hair growth to the level of the digits. Temperature gradient within normal limits. No varicosities present bilateral. No edema present bilateral.   Neurology: The patient has absent sensation measured with a 5.07/10g Semmes Weinstein Monofilament at all pedal sites bilateral . Vibratory sensation diminished bilateral with tuning fork. No Babinski sign present bilateral.   Musculoskeletal: Asymptomatic planus and hammertoe pedal deformities noted bilateral. Muscular strength 5/5 in all lower extremity muscular groups bilateral without pain on range of motion . No tenderness with calf compression bilateral.  Assessment  and Plan: Problem List Items Addressed This Visit    None    Visit Diagnoses    Diabetic polyneuropathy associated with type 2 diabetes mellitus ([place])    -  Primary   Pain due to onychomycosis of toenails of both feet          -Examined patient. -Discussed and educated patient on diabetic foot care, especially with  regards to the vascular, neurological and musculoskeletal systems.  -Stressed the importance of good glycemic control and the detriment of not  controlling glucose levels in relation to the foot. -Mechanically debrided all nails 1-5 bilateral using sterile nail nipper and filed with dremel without incident  -Recommend foot powder as needed for sweaty feet -Advised patient if neuropathy worsens to discuss with PCP or neurologist; Need to work on lower A1c to help nerves as well -Answered all patient questions -Patient to return  in 3 months for  at risk foot care -Patient advised to call the office if any problems or questions arise in the meantime.  Landis Martins, DPM

## 2017-09-28 ENCOUNTER — Telehealth: Payer: Self-pay | Admitting: Sports Medicine

## 2017-09-28 NOTE — Telephone Encounter (Signed)
This is Scientist, research (medical)hanelle calling from the [facility] [place]. I was calling to retreive [name] medical records from 27 September 2017. If you could please fax those to us at [phone]. If you have any questions you can call me back at [phone]. Thank you.

## 2017-11-16 ENCOUNTER — Ambulatory Visit (INDEPENDENT_AMBULATORY_CARE_PROVIDER_SITE_OTHER): Payer: Medicare Other | Admitting: *Deleted

## 2017-11-16 DIAGNOSIS — I472 Ventricular tachycardia, unspecified: Secondary | ICD-10-CM

## 2017-11-17 NOTE — Progress Notes (Signed)
Remote ICD transmission.   

## 2017-11-22 LAB — CUP PACEART REMOTE DEVICE CHECK
Battery Remaining Percentage: 91 %
Brady Statistic RV Percent Paced: 0 %
HighPow Impedance: 59 Ohm
Implantable Lead Implant Date: 20070423
Implantable Lead Location: 753860
Implantable Lead Model: 185
Implantable Lead Serial Number: 23456
Implantable Pulse Generator Implant Date: 20070423
Lead Channel Impedance Value: 576 Ohm
Lead Channel Pacing Threshold Pulse Width: 0.4 ms
Lead Channel Setting Sensing Sensitivity: 0.6 mV
MDC IDC MSMT BATTERY REMAINING LONGEVITY: 84 mo
MDC IDC MSMT LEADCHNL RV PACING THRESHOLD AMPLITUDE: 1.2 V
MDC IDC SESS DTM: 20190529164500
MDC IDC SET LEADCHNL RV PACING AMPLITUDE: 2.4 V
MDC IDC SET LEADCHNL RV PACING PULSEWIDTH: 0.4 ms
Pulse Gen Serial Number: 102535

## 2017-12-07 ENCOUNTER — Telehealth: Payer: Self-pay | Admitting: Cardiology

## 2017-12-07 NOTE — Telephone Encounter (Signed)
Spoke to patient about his recent discomfort. Patient states that he's noticed a pulling/pain around his device site and neck. Patient states that the pain is more noticeable when he moves or tries to lift something. Patient does not believe he hit the site on anything and he does not believe this is a result of the way he slept. Patient denies any redness or swelling of the device site. Patient states that there is a "hard" place around the site. Patient does not believe that what he's feeling is the actual device. Patient states that he believes it's "scar tissue" that he's feeling, but he said that it must be "growing". I told patient that I was not sure what he was referring to. I told him that I would need to bring him into the office to assess the wound. Patient states that he would prefer to wait a few days and call back if he felt that it was necessary. Patient verbalized appreciation of call. Nothing further was needed at this time.

## 2017-12-07 NOTE — Telephone Encounter (Signed)
Patient called and stated that if he moves his head up or down or left to right he feels pain or a pulling sensation around his ICD. He has been experiencing this for about 3 days now. Informed pt that Device Tech RN will return his call as soon as she can.  Pt verbalized understanding.

## 2018-01-03 ENCOUNTER — Ambulatory Visit: Payer: Non-veteran care | Admitting: Sports Medicine

## 2018-02-16 ENCOUNTER — Encounter: Payer: Medicare Other | Admitting: *Deleted

## 2018-03-02 ENCOUNTER — Telehealth: Payer: Self-pay | Admitting: Cardiology

## 2018-03-02 NOTE — Telephone Encounter (Signed)
Opened in error

## 2018-03-08 ENCOUNTER — Ambulatory Visit (INDEPENDENT_AMBULATORY_CARE_PROVIDER_SITE_OTHER): Payer: Medicare Other | Admitting: Internal Medicine

## 2018-03-08 ENCOUNTER — Encounter: Payer: Self-pay | Admitting: Internal Medicine

## 2018-03-08 VITALS — BP 102/74 | HR 73 | Ht 75.0 in | Wt 231.0 lb

## 2018-03-08 DIAGNOSIS — Z95810 Presence of automatic (implantable) cardiac defibrillator: Secondary | ICD-10-CM

## 2018-03-08 DIAGNOSIS — I472 Ventricular tachycardia, unspecified: Secondary | ICD-10-CM

## 2018-03-08 DIAGNOSIS — I255 Ischemic cardiomyopathy: Secondary | ICD-10-CM

## 2018-03-08 DIAGNOSIS — I5022 Chronic systolic (congestive) heart failure: Secondary | ICD-10-CM | POA: Diagnosis not present

## 2018-03-08 MED ORDER — KETOROLAC TROMETHAMINE 10 MG PO TABS: 10.0000 mg | ORAL_TABLET | Freq: Three times a day (TID) | ORAL | 0 refills | Status: DC | PRN

## 2018-03-08 NOTE — Patient Instructions (Addendum)
Medication Instructions:  Your physician has recommended you make the following change in your medication:   1. You may take Ketorolac, 10mg  tablet, every 8hrs as needed for pain. Please follow up with your Primary Care Physician for further pain recommendation.  Labwork: None ordered.  Testing/Procedures: None ordered.  Follow-Up: Your physician wants you to follow-up in: One Year with Dr Graciela HusbandsKlein. You will receive a reminder letter in the mail two months in advance. If you don't receive a letter, please call our office to schedule the follow-up appointment.     Any Other Special Instructions Will Be Listed Below (If Applicable).     If you need a refill on your cardiac medications before your next appointment, please call your pharmacy.

## 2018-03-08 NOTE — Progress Notes (Signed)
Patient Care Team: Hoyt Koch, MD as PCP - General (Internal Medicine) System, Provider Not In as Attending Physician   HPI  Grant [name] is a 72 y.o. male Seen in follow-up for ICD implanted for ventricular tachycardia in the context of ischemic heart disease prior stenting. He had interval normalization of LV function with an echo EF 2013 55%.  Noted when first evaluated 11/15 to have resting bradycardia  we had anticipated exercise treadmill testing which was never accomplished  Device interrogation prior to that 6/17 had demonstrated only nonsustained ventricular tachycardia January 2018 he was seen in the emergency room in [place] for multiple shocks. Amiodarone was initiated;     Date TSH LFTs PFTs Cr Hgb  12/15 2.34      1/17   62  1.2   1/18  5.87 10      5/18 7.02 14   13.6           He is now followed at the [place].  He is here today for a very specific request.  About 3 weeks ago he was reaching with his left arm and thereafter he has had progressive pain and discomfort in his left arm with some weakness.  He was seen at the [place] and they suggested it was related to migration of his defibrillator and referred him for pocket revision.   Past Medical History:  Diagnosis Date  . AICD (automatic cardioverter/defibrillator) present   . BPH (benign prostatic hyperplasia)   . CAD (coronary artery disease)   . DM (diabetes mellitus) ([place])   . HLD (hyperlipidemia)   . Hyperglycemia 03/09/2017  . Hypothyroidism   . NSVT (nonsustained ventricular tachycardia) ([place])   . Obstructive sleep apnea     Past Surgical History:  Procedure Laterality Date  . ABDOMINAL SURGERY  1981   repair lungs, liver, and intenstines from trauma    Current Outpatient Medications  Medication Sig Dispense Refill  . amiodarone (PACERONE) 200 MG tablet Take 200 mg by mouth 2 (two) times daily.     Marland Kitchen apixaban (ELIQUIS) 5 MG TABS tablet Take 5 mg by mouth 2 (two) times  daily.    Marland Kitchen aspirin EC 81 MG tablet Take 81 mg by mouth daily.    Marland Kitchen atorvastatin (LIPITOR) 40 MG tablet Take 40 mg by mouth at bedtime.     . blood glucose meter kit and supplies KIT Dispense based on patient and insurance preference. Use up to four times daily as directed. (FOR ICD-9 250.00, 250.01). 1 each 0  . Cholecalciferol (VITAMIN D3) 2000 units TABS Take 2,000 Units by mouth daily.    . cyclobenzaprine (FLEXERIL) 10 MG tablet Take 10 mg by mouth at bedtime as needed for muscle spasms.    Marland Kitchen escitalopram (LEXAPRO) 10 MG tablet Take 10 mg by mouth at bedtime.    . fluticasone (FLONASE) 50 MCG/ACT nasal spray Place 2 sprays into both nostrils daily. 16 g 2  . HYDROcodone-acetaminophen (NORCO/VICODIN) 5-325 MG tablet Take 1 tablet by mouth 3 (three) times daily as needed for moderate pain.     Marland Kitchen insulin aspart (NOVOLOG) 100 UNIT/ML injection Inject 0-15 Units into the skin 3 (three) times daily with meals. 10 mL 11  . insulin glargine (LANTUS) 100 UNIT/ML injection Inject 32 Units into the skin daily.    . Insulin Syringe-Needle U-100 (INSULIN SYRINGE .5CC/31GX5/16") 31G X 5/16" 0.5 ML MISC 16 Units by Does not apply route at bedtime. 200 each 0  .  levothyroxine (SYNTHROID, LEVOTHROID) 50 MCG tablet Take 50 mcg by mouth daily before breakfast.    . Semaglutide, 1 MG/DOSE, (OZEMPIC, 1 MG/DOSE,) 2 MG/1.5ML SOPN Inject 1 mg into the skin once a week.    . terazosin (HYTRIN) 5 MG capsule Take 1 capsule (5 mg total) by mouth 2 (two) times daily. 180 capsule 3   No current facility-administered medications for this visit.     Allergies  Allergen Reactions  . Food     PT EATS NO LAMB OR GOAT.      Review of Systems negative except from HPI and PMH  Physical Exam BP 102/74   Pulse 73   Ht _0  (1.905 m)   Wt 231 lb (104.8 kg)   SpO2 95%   BMI 28.87 kg/m  Well developed and nourished in no acute distress HENT normal Neck supple with JVP-flat Clear Regular rate and rhythm, no  murmurs or gallops Abd-soft with active BS No Clubbing cyanosis edema Skin-warm and dry A & Oriented there is hyperesthesia across his front and back of his left shoulder.  And hyperesthesia down his left arm.  He has weakness with thumb opposition on the left side. device  ECG  Sinus @ 19/09/33    Assessment and  Plan  Ischemic Cardiomyopathy--interval normalization  Ventricular Tachycardia  Atrial fibrillation   Sleep disordered breathing  ICD [employer]    The patient's device was interrogated.  The information was reviewed. No changes were made in the programming.     Sinus Bradycardia  Elevated TSH   Hypertrophic heart disease  L arm pain and hyperthesia  No inteval AF  L arm pain-- I dont think this is related to his ICD  Exam to my ignorance suggests cervical spine injury and maybe compression as there is also weakness  Have reviewed with famil  They are requesting pain meds,  Will check renal function and Rx low dose toradol   .spoke with PCP at [facility]

## 2018-03-27 ENCOUNTER — Ambulatory Visit: Payer: Non-veteran care | Admitting: Podiatry

## 2018-03-28 ENCOUNTER — Encounter: Payer: Self-pay | Admitting: Sports Medicine

## 2018-03-28 ENCOUNTER — Ambulatory Visit (INDEPENDENT_AMBULATORY_CARE_PROVIDER_SITE_OTHER): Payer: Medicare Other | Admitting: Sports Medicine

## 2018-03-28 DIAGNOSIS — M79675 Pain in left toe(s): Secondary | ICD-10-CM

## 2018-03-28 DIAGNOSIS — M79674 Pain in right toe(s): Secondary | ICD-10-CM | POA: Diagnosis not present

## 2018-03-28 DIAGNOSIS — B351 Tinea unguium: Secondary | ICD-10-CM | POA: Diagnosis not present

## 2018-03-28 DIAGNOSIS — E1142 Type 2 diabetes mellitus with diabetic polyneuropathy: Secondary | ICD-10-CM | POA: Diagnosis not present

## 2018-03-28 NOTE — Progress Notes (Signed)
Subjective: Grant [name] is a 72 y.o. male patient with history of diabetes who presents to office today complaining of long,mildly painful nails  while ambulating in shoes; unable to trim.Reports continued numbness/burning to feet due to neuropathy. Patient states that the glucose reading this morning was not recorded.  Last visit to PCP was a few months ago and will see again on next week.  No other issues.   A1c 12.4 down to 8 now.   Patient Active Problem List   Diagnosis Date Noted  . Hyperglycemia 03/09/2017  . Hyperglycemia, unspecified 03/08/2017  . ICD (implantable cardioverter-defibrillator) in place 03/08/2017  . Acute kidney injury ([place]) 03/08/2017  . Abdominal pain   . Diabetes mellitus with complication ([place])   . Viral URI with cough 09/10/2015  . Upper airway cough syndrome 06/05/2015  . Hx of hepatitis C 05/06/2015  . Muscle cramp 05/06/2015  . Diet-controlled diabetes mellitus ([place]) 06/04/2014  . Essential hypertension 06/04/2014  . Benign prostatic hyperplasia 06/04/2014  . Tobacco abuse 06/04/2014  . Osteoarthritis 06/04/2014  . Hypothyroidism 06/04/2014  . CAD (coronary artery disease) 06/04/2014   Current Outpatient Medications on File Prior to Visit  Medication Sig Dispense Refill  . amiodarone (PACERONE) 200 MG tablet Take 200 mg by mouth 2 (two) times daily.     Marland Kitchen apixaban (ELIQUIS) 5 MG TABS tablet Take 5 mg by mouth 2 (two) times daily.    Marland Kitchen aspirin EC 81 MG tablet Take 81 mg by mouth daily.    Marland Kitchen atorvastatin (LIPITOR) 40 MG tablet Take 40 mg by mouth at bedtime.     . blood glucose meter kit and supplies KIT Dispense based on patient and insurance preference. Use up to four times daily as directed. (FOR ICD-9 250.00, 250.01). 1 each 0  . Cholecalciferol (VITAMIN D3) 2000 units TABS Take 2,000 Units by mouth daily.    . cyclobenzaprine (FLEXERIL) 10 MG tablet Take 10 mg by mouth at bedtime as needed for muscle spasms.    Marland Kitchen escitalopram (LEXAPRO) 10  MG tablet Take 10 mg by mouth at bedtime.    . fluticasone (FLONASE) 50 MCG/ACT nasal spray Place 2 sprays into both nostrils daily. 16 g 2  . HYDROcodone-acetaminophen (NORCO/VICODIN) 5-325 MG tablet Take 1 tablet by mouth 3 (three) times daily as needed for moderate pain.     Marland Kitchen insulin aspart (NOVOLOG) 100 UNIT/ML injection Inject 0-15 Units into the skin 3 (three) times daily with meals. 10 mL 11  . insulin glargine (LANTUS) 100 UNIT/ML injection Inject 32 Units into the skin daily.    . Insulin Syringe-Needle U-100 (INSULIN SYRINGE .5CC/31GX5/16") 31G X 5/16" 0.5 ML MISC 16 Units by Does not apply route at bedtime. 200 each 0  . ketorolac (TORADOL) 10 MG tablet Take 1 tablet (10 mg total) by mouth every 8 (eight) hours as needed. 15 tablet 0  . levothyroxine (SYNTHROID, LEVOTHROID) 50 MCG tablet Take 50 mcg by mouth daily before breakfast.    . Semaglutide, 1 MG/DOSE, (OZEMPIC, 1 MG/DOSE,) 2 MG/1.5ML SOPN Inject 1 mg into the skin once a week.    . terazosin (HYTRIN) 5 MG capsule Take 1 capsule (5 mg total) by mouth 2 (two) times daily. 180 capsule 3   No current facility-administered medications on file prior to visit.    Allergies  Allergen Reactions  . Food     PT EATS NO LAMB OR GOAT.    No results found for this or any previous visit (from the  past 2160 hour(s)).  Objective: General: Patient is awake, alert, and oriented x 3 and in no acute distress.  Integument: Skin is warm, dry and supple bilateral. Nails are tender, long, thickened and  dystrophic with subungual debris, consistent with onychomycosis, 1-5 bilateral. No signs of infection. No open lesions or preulcerative lesions present bilateral. Remaining integument unremarkable.  Vasculature:  Dorsalis Pedis pulse 1/4 bilateral. Posterior Tibial pulse  1/4 bilateral.  Capillary fill time <3 sec 1-5 bilateral. Positive hair growth to the level of the digits. Temperature gradient within normal limits. No varicosities present  bilateral. No edema present bilateral.   Neurology: The patient has absent sensation measured with a 5.07/10g Semmes Weinstein Monofilament at all pedal sites bilateral . Vibratory sensation diminished bilateral with tuning fork. No Babinski sign present bilateral.   Musculoskeletal: Asymptomatic planus and hammertoe pedal deformities noted bilateral. Muscular strength 5/5 in all lower extremity muscular groups bilateral without pain on range of motion . No tenderness with calf compression bilateral.  Assessment and Plan: Problem List Items Addressed This Visit    None    Visit Diagnoses    Pain due to onychomycosis of toenails of both feet    -  Primary   Diabetic polyneuropathy associated with type 2 diabetes mellitus ([place])          -Examined patient. -Discussed and educated patient on diabetic foot care, especially with  regards to the vascular, neurological and musculoskeletal systems. -Mechanically debrided all nails 1-5 bilateral using sterile nail nipper and filed with dremel without incident -Patient to return  in 3 months for at risk foot care -Patient advised to call the office if any problems or questions arise in the meantime.  Landis Martins, DPM

## 2018-04-14 LAB — CUP PACEART INCLINIC DEVICE CHECK
Brady Statistic RV Percent Paced: 1 % — CL
HIGH POWER IMPEDANCE MEASURED VALUE: 41 Ohm
HIGH POWER IMPEDANCE MEASURED VALUE: 41 Ohm
HIGH POWER IMPEDANCE MEASURED VALUE: 53 Ohm
HighPow Impedance: 53 Ohm
Implantable Lead Implant Date: 20070423
Implantable Lead Location: 753860
Implantable Pulse Generator Implant Date: 20070423
Lead Channel Impedance Value: 561 Ohm
Lead Channel Pacing Threshold Amplitude: 1.2 V
Lead Channel Pacing Threshold Pulse Width: 0.4 ms
Lead Channel Sensing Intrinsic Amplitude: 9.5 mV
MDC IDC LEAD SERIAL: 23456
MDC IDC MSMT LEADCHNL RV IMPEDANCE VALUE: 561 Ohm
MDC IDC MSMT LEADCHNL RV SENSING INTR AMPL: 9.5 mV
MDC IDC SESS DTM: 20190918040000
MDC IDC SET LEADCHNL RV PACING AMPLITUDE: 2.2 V
MDC IDC SET LEADCHNL RV PACING PULSEWIDTH: 0.4 ms
MDC IDC SET LEADCHNL RV SENSING SENSITIVITY: 0.6 mV
Pulse Gen Serial Number: 102535

## 2018-04-27 ENCOUNTER — Telehealth: Payer: Self-pay | Admitting: Podiatry

## 2018-04-27 NOTE — Telephone Encounter (Signed)
[facility] to [phone] Attn: Deanna Artis

## 2018-05-10 ENCOUNTER — Other Ambulatory Visit: Payer: Self-pay

## 2018-07-04 ENCOUNTER — Ambulatory Visit (INDEPENDENT_AMBULATORY_CARE_PROVIDER_SITE_OTHER): Payer: No Typology Code available for payment source | Admitting: Sports Medicine

## 2018-07-04 ENCOUNTER — Encounter: Payer: Self-pay | Admitting: Sports Medicine

## 2018-07-04 DIAGNOSIS — M79675 Pain in left toe(s): Secondary | ICD-10-CM | POA: Diagnosis not present

## 2018-07-04 DIAGNOSIS — B351 Tinea unguium: Secondary | ICD-10-CM

## 2018-07-04 DIAGNOSIS — M79674 Pain in right toe(s): Secondary | ICD-10-CM | POA: Diagnosis not present

## 2018-07-04 DIAGNOSIS — E1142 Type 2 diabetes mellitus with diabetic polyneuropathy: Secondary | ICD-10-CM | POA: Diagnosis not present

## 2018-07-04 NOTE — Patient Instructions (Signed)
[facility] dry [facility]

## 2018-07-04 NOTE — Progress Notes (Signed)
Subjective: Grant [name] is a 73 y.o. male patient with history of diabetes who presents to office today complaining of long,mildly painful nails  while ambulating in shoes; unable to trim.Reports continued numbness/burning to feet due to neuropathy with history of radicular problems. Patient states that the glucose reading this morning was not recorded.  Last visit to PCP was March.  No other issues.   A1c 8.   Patient Active Problem List   Diagnosis Date Noted  . Hyperglycemia 03/09/2017  . Hyperglycemia, unspecified 03/08/2017  . ICD (implantable cardioverter-defibrillator) in place 03/08/2017  . Acute kidney injury ([place]) 03/08/2017  . Abdominal pain   . Diabetes mellitus with complication ([place])   . Viral URI with cough 09/10/2015  . Upper airway cough syndrome 06/05/2015  . Hx of hepatitis C 05/06/2015  . Muscle cramp 05/06/2015  . Diet-controlled diabetes mellitus ([place]) 06/04/2014  . Essential hypertension 06/04/2014  . Benign prostatic hyperplasia 06/04/2014  . Tobacco abuse 06/04/2014  . Osteoarthritis 06/04/2014  . Hypothyroidism 06/04/2014  . CAD (coronary artery disease) 06/04/2014   Current Outpatient Medications on File Prior to Visit  Medication Sig Dispense Refill  . amiodarone (PACERONE) 200 MG tablet Take 200 mg by mouth 2 (two) times daily.     Marland Kitchen apixaban (ELIQUIS) 5 MG TABS tablet Take 5 mg by mouth 2 (two) times daily.    Marland Kitchen aspirin EC 81 MG tablet Take 81 mg by mouth daily.    Marland Kitchen atorvastatin (LIPITOR) 40 MG tablet Take 40 mg by mouth at bedtime.     . blood glucose meter kit and supplies KIT Dispense based on patient and insurance preference. Use up to four times daily as directed. (FOR ICD-9 250.00, 250.01). 1 each 0  . Cholecalciferol (VITAMIN D3) 2000 units TABS Take 2,000 Units by mouth daily.    . cyclobenzaprine (FLEXERIL) 10 MG tablet Take 10 mg by mouth at bedtime as needed for muscle spasms.    Marland Kitchen escitalopram (LEXAPRO) 10 MG tablet Take 10 mg by  mouth at bedtime.    . fluticasone (FLONASE) 50 MCG/ACT nasal spray Place 2 sprays into both nostrils daily. 16 g 2  . HYDROcodone-acetaminophen (NORCO/VICODIN) 5-325 MG tablet Take 1 tablet by mouth 3 (three) times daily as needed for moderate pain.     Marland Kitchen insulin aspart (NOVOLOG) 100 UNIT/ML injection Inject 0-15 Units into the skin 3 (three) times daily with meals. 10 mL 11  . insulin glargine (LANTUS) 100 UNIT/ML injection Inject 32 Units into the skin daily.    . Insulin Syringe-Needle U-100 (INSULIN SYRINGE .5CC/31GX5/16") 31G X 5/16" 0.5 ML MISC 16 Units by Does not apply route at bedtime. 200 each 0  . ketorolac (TORADOL) 10 MG tablet Take 1 tablet (10 mg total) by mouth every 8 (eight) hours as needed. 15 tablet 0  . levothyroxine (SYNTHROID, LEVOTHROID) 50 MCG tablet Take 50 mcg by mouth daily before breakfast.    . Semaglutide, 1 MG/DOSE, (OZEMPIC, 1 MG/DOSE,) 2 MG/1.5ML SOPN Inject 1 mg into the skin once a week.    . terazosin (HYTRIN) 5 MG capsule Take 1 capsule (5 mg total) by mouth 2 (two) times daily. 180 capsule 3   No current facility-administered medications on file prior to visit.    Allergies  Allergen Reactions  . Food     PT EATS NO LAMB OR GOAT.    No results found for this or any previous visit (from the past 2160 hour(s)).  Objective: General: Patient is awake,  alert, and oriented x 3 and in no acute distress.  Integument: Skin is warm, dry and supple bilateral. Nails are tender, long, thickened and  dystrophic with subungual debris, consistent with onychomycosis, 1-5 bilateral. No signs of infection. No open lesions or preulcerative lesions present bilateral. Remaining integument unremarkable.  Vasculature:  Dorsalis Pedis pulse 1/4 bilateral. Posterior Tibial pulse  1/4 bilateral.  Capillary fill time <3 sec 1-5 bilateral. Positive hair growth to the level of the digits. Temperature gradient within normal limits. No varicosities present bilateral. No edema  present bilateral.   Neurology: The patient has absent sensation measured with a 5.07/10g Semmes Weinstein Monofilament at all pedal sites bilateral . Vibratory sensation diminished bilateral with tuning fork. No Babinski sign present bilateral.   Musculoskeletal: Asymptomatic Bunions, planus, and hammertoe pedal deformities noted bilateral. Muscular strength 5/5 in all lower extremity muscular groups bilateral without pain on range of motion . No tenderness with calf compression bilateral.  Assessment and Plan: Problem List Items Addressed This Visit    None    Visit Diagnoses    Pain due to onychomycosis of toenails of both feet    -  Primary   Diabetic polyneuropathy associated with type 2 diabetes mellitus ([place])          -Examined patient. -Discussed and educated patient on diabetic foot care, especially with  regards to the vascular, neurological and musculoskeletal systems. -Mechanically debrided all nails 1-5 bilateral using sterile nail nipper and filed with dremel without incident -Advised patient to continue with Gabapentin and to have spine doctor to adjust to help with neuropathy in feet; encouraged good glycemic control to help as well -Patient to return  in 3 months for at risk foot care -Patient advised to call the office if any problems or questions arise in the meantime.  Landis Martins, DPM

## 2018-10-03 ENCOUNTER — Other Ambulatory Visit: Payer: Self-pay

## 2018-10-03 ENCOUNTER — Encounter: Payer: Self-pay | Admitting: Sports Medicine

## 2018-10-03 ENCOUNTER — Ambulatory Visit: Payer: Non-veteran care | Admitting: Sports Medicine

## 2018-10-03 ENCOUNTER — Ambulatory Visit (INDEPENDENT_AMBULATORY_CARE_PROVIDER_SITE_OTHER): Payer: Medicare Other | Admitting: Sports Medicine

## 2018-10-03 VITALS — Temp 96.6°F

## 2018-10-03 DIAGNOSIS — M79675 Pain in left toe(s): Secondary | ICD-10-CM | POA: Diagnosis not present

## 2018-10-03 DIAGNOSIS — B351 Tinea unguium: Secondary | ICD-10-CM | POA: Diagnosis not present

## 2018-10-03 DIAGNOSIS — M79674 Pain in right toe(s): Secondary | ICD-10-CM

## 2018-10-03 DIAGNOSIS — M2042 Other hammer toe(s) (acquired), left foot: Secondary | ICD-10-CM

## 2018-10-03 DIAGNOSIS — M2041 Other hammer toe(s) (acquired), right foot: Secondary | ICD-10-CM

## 2018-10-03 DIAGNOSIS — E1142 Type 2 diabetes mellitus with diabetic polyneuropathy: Secondary | ICD-10-CM

## 2018-10-03 DIAGNOSIS — M21619 Bunion of unspecified foot: Secondary | ICD-10-CM

## 2018-10-03 NOTE — Progress Notes (Signed)
Subjective: Grant [name] is a 73 y.o. male patient with history of diabetes who presents to office today complaining of long,mildly painful nails  while ambulating in shoes; unable to trim.Reports occassional pain to right bunion and toes. Patient states that the glucose reading this morning was not recorded.  Last visit to PCP was a few weeks ago stopped insulin.  No other issues.   A1c 12.4.   Patient Active Problem List   Diagnosis Date Noted  . Hyperglycemia 03/09/2017  . Hyperglycemia, unspecified 03/08/2017  . ICD (implantable cardioverter-defibrillator) in place 03/08/2017  . Acute kidney injury ([place]) 03/08/2017  . Abdominal pain   . Diabetes mellitus with complication ([place])   . Viral URI with cough 09/10/2015  . Upper airway cough syndrome 06/05/2015  . Hx of hepatitis C 05/06/2015  . Muscle cramp 05/06/2015  . Diet-controlled diabetes mellitus ([place]) 06/04/2014  . Essential hypertension 06/04/2014  . Benign prostatic hyperplasia 06/04/2014  . Tobacco abuse 06/04/2014  . Osteoarthritis 06/04/2014  . Hypothyroidism 06/04/2014  . CAD (coronary artery disease) 06/04/2014   Current Outpatient Medications on File Prior to Visit  Medication Sig Dispense Refill  . amiodarone (PACERONE) 200 MG tablet Take 200 mg by mouth 2 (two) times daily.     Marland Kitchen amitriptyline (ELAVIL) 25 MG tablet TAKE 1 TABLET BY MOUTH EVERYDAY AT BEDTIME FOR 5 NIGHTS THEN TAKE 2 TABLETS DAILY FOR 5 NIGHTS THEN 3 TABLETS EVERY NIGHT    . apixaban (ELIQUIS) 5 MG TABS tablet Take 5 mg by mouth 2 (two) times daily.    Marland Kitchen aspirin EC 81 MG tablet Take 81 mg by mouth daily.    Marland Kitchen atorvastatin (LIPITOR) 40 MG tablet Take 40 mg by mouth at bedtime.     . blood glucose meter kit and supplies KIT Dispense based on patient and insurance preference. Use up to four times daily as directed. (FOR ICD-9 250.00, 250.01). 1 each 0  . Cholecalciferol (VITAMIN D3) 2000 units TABS Take 2,000 Units by mouth daily.    .  cyclobenzaprine (FLEXERIL) 10 MG tablet Take 10 mg by mouth at bedtime as needed for muscle spasms.    Marland Kitchen escitalopram (LEXAPRO) 10 MG tablet Take 10 mg by mouth at bedtime.    . fluticasone (FLONASE) 50 MCG/ACT nasal spray Place 2 sprays into both nostrils daily. 16 g 2  . gabapentin (NEURONTIN) 300 MG capsule TAKE 1 CAPSULE BY MOUTH EVERY DAY AT BEDTIME FOR 5 DAYS THEN TAKE 1 CAPSULE TWICE DAILY FOR 5 DAYS THEN TAKE 1 CAPSULE THREE TIMES DAILY    . HYDROcodone-acetaminophen (NORCO/VICODIN) 5-325 MG tablet Take 1 tablet by mouth 3 (three) times daily as needed for moderate pain.     Marland Kitchen insulin aspart (NOVOLOG) 100 UNIT/ML injection Inject 0-15 Units into the skin 3 (three) times daily with meals. 10 mL 11  . insulin glargine (LANTUS) 100 UNIT/ML injection Inject 32 Units into the skin daily.    . Insulin Syringe-Needle U-100 (INSULIN SYRINGE .5CC/31GX5/16") 31G X 5/16" 0.5 ML MISC 16 Units by Does not apply route at bedtime. 200 each 0  . ketorolac (TORADOL) 10 MG tablet Take 1 tablet (10 mg total) by mouth every 8 (eight) hours as needed. 15 tablet 0  . levothyroxine (SYNTHROID, LEVOTHROID) 50 MCG tablet Take 50 mcg by mouth daily before breakfast.    . Semaglutide, 1 MG/DOSE, (OZEMPIC, 1 MG/DOSE,) 2 MG/1.5ML SOPN Inject 1 mg into the skin once a week.    . terazosin (HYTRIN) 5 MG  capsule Take 1 capsule (5 mg total) by mouth 2 (two) times daily. 180 capsule 3   No current facility-administered medications on file prior to visit.    Allergies  Allergen Reactions  . Food     PT EATS NO LAMB OR GOAT.    No results found for this or any previous visit (from the past 2160 hour(s)).  Objective: General: Patient is awake, alert, and oriented x 3 and in no acute distress.  Integument: Skin is warm, dry and supple bilateral. Nails are tender, long, thickened and  dystrophic with subungual debris, consistent with onychomycosis, 1-5 bilateral. No signs of infection. No open lesions or preulcerative  lesions present bilateral. Remaining integument unremarkable.  Vasculature:  Dorsalis Pedis pulse 1/4 bilateral. Posterior Tibial pulse  1/4 bilateral.  Capillary fill time <3 sec 1-5 bilateral. Positive hair growth to the level of the digits. Temperature gradient within normal limits. No varicosities present bilateral. No edema present bilateral.   Neurology: The patient has absent sensation measured with a 5.07/10g Semmes Weinstein Monofilament at all pedal sites bilateral . Vibratory sensation diminished bilateral with tuning fork. No Babinski sign present bilateral.   Musculoskeletal: Minimally symptomatic Bunions, planus, and hammertoe pedal deformities noted bilateral. Muscular strength 5/5 in all lower extremity muscular groups bilateral without pain on range of motion . No tenderness with calf compression bilateral.  Assessment and Plan: Problem List Items Addressed This Visit    None    Visit Diagnoses    Pain due to onychomycosis of toenails of both feet    -  Primary   Diabetic polyneuropathy associated with type 2 diabetes mellitus (HCC)       Relevant Medications   gabapentin (NEURONTIN) 300 MG capsule   amitriptyline (ELAVIL) 25 MG tablet   Bunion       Hammer toes of both feet         -Examined patient. -Discussed and educated patient on diabetic foot care, especially with  regards to the vascular, neurological and musculoskeletal systems. -Mechanically debrided all nails 1-5 bilateral using sterile nail nipper and filed with dremel without incident -Advised patient to change shoes and stop wearing cowboy boot and dispensed toe spacer to help with bunion and hammertoes -Patient to return  in 3 months for at risk foot care -Patient advised to call the office if any problems or questions arise in the meantime.  Landis Martins, DPM

## 2019-01-02 ENCOUNTER — Ambulatory Visit (INDEPENDENT_AMBULATORY_CARE_PROVIDER_SITE_OTHER): Payer: Medicare Other | Admitting: Sports Medicine

## 2019-01-02 ENCOUNTER — Encounter: Payer: Self-pay | Admitting: Sports Medicine

## 2019-01-02 ENCOUNTER — Other Ambulatory Visit: Payer: Self-pay

## 2019-01-02 VITALS — Temp 96.7°F

## 2019-01-02 DIAGNOSIS — M79675 Pain in left toe(s): Secondary | ICD-10-CM | POA: Diagnosis not present

## 2019-01-02 DIAGNOSIS — E1142 Type 2 diabetes mellitus with diabetic polyneuropathy: Secondary | ICD-10-CM

## 2019-01-02 DIAGNOSIS — B351 Tinea unguium: Secondary | ICD-10-CM | POA: Diagnosis not present

## 2019-01-02 DIAGNOSIS — M2042 Other hammer toe(s) (acquired), left foot: Secondary | ICD-10-CM

## 2019-01-02 DIAGNOSIS — M21619 Bunion of unspecified foot: Secondary | ICD-10-CM

## 2019-01-02 DIAGNOSIS — M79674 Pain in right toe(s): Secondary | ICD-10-CM

## 2019-01-02 DIAGNOSIS — M2041 Other hammer toe(s) (acquired), right foot: Secondary | ICD-10-CM

## 2019-01-02 NOTE — Progress Notes (Signed)
Subjective: Grant [name] is a 73 y.o. male patient with history of diabetes who presents to office today complaining of long,mildly painful nails  while ambulating in shoes; unable to trim. Reports that he was not able to wear bunion pads given last visit. No new issues noted.   A1c 12.4 now down to 7  Patient Active Problem List   Diagnosis Date Noted  . Hyperglycemia 03/09/2017  . Hyperglycemia, unspecified 03/08/2017  . ICD (implantable cardioverter-defibrillator) in place 03/08/2017  . Acute kidney injury ([place]) 03/08/2017  . Abdominal pain   . Diabetes mellitus with complication ([place])   . Viral URI with cough 09/10/2015  . Upper airway cough syndrome 06/05/2015  . Hx of hepatitis C 05/06/2015  . Muscle cramp 05/06/2015  . Diet-controlled diabetes mellitus ([place]) 06/04/2014  . Essential hypertension 06/04/2014  . Benign prostatic hyperplasia 06/04/2014  . Tobacco abuse 06/04/2014  . Osteoarthritis 06/04/2014  . Hypothyroidism 06/04/2014  . CAD (coronary artery disease) 06/04/2014   Current Outpatient Medications on File Prior to Visit  Medication Sig Dispense Refill  . amiodarone (PACERONE) 200 MG tablet Take 200 mg by mouth 2 (two) times daily.     Marland Kitchen amitriptyline (ELAVIL) 25 MG tablet TAKE 1 TABLET BY MOUTH EVERYDAY AT BEDTIME FOR 5 NIGHTS THEN TAKE 2 TABLETS DAILY FOR 5 NIGHTS THEN 3 TABLETS EVERY NIGHT    . apixaban (ELIQUIS) 5 MG TABS tablet Take 5 mg by mouth 2 (two) times daily.    Marland Kitchen aspirin EC 81 MG tablet Take 81 mg by mouth daily.    Marland Kitchen atorvastatin (LIPITOR) 40 MG tablet Take 40 mg by mouth at bedtime.     . blood glucose meter kit and supplies KIT Dispense based on patient and insurance preference. Use up to four times daily as directed. (FOR ICD-9 250.00, 250.01). 1 each 0  . Cholecalciferol (VITAMIN D3) 2000 units TABS Take 2,000 Units by mouth daily.    . cyclobenzaprine (FLEXERIL) 10 MG tablet Take 10 mg by mouth at bedtime as needed for muscle spasms.    Marland Kitchen  escitalopram (LEXAPRO) 10 MG tablet Take 10 mg by mouth at bedtime.    . fluticasone (FLONASE) 50 MCG/ACT nasal spray Place 2 sprays into both nostrils daily. 16 g 2  . gabapentin (NEURONTIN) 300 MG capsule TAKE 1 CAPSULE BY MOUTH EVERY DAY AT BEDTIME FOR 5 DAYS THEN TAKE 1 CAPSULE TWICE DAILY FOR 5 DAYS THEN TAKE 1 CAPSULE THREE TIMES DAILY    . HYDROcodone-acetaminophen (NORCO/VICODIN) 5-325 MG tablet Take 1 tablet by mouth 3 (three) times daily as needed for moderate pain.     Marland Kitchen insulin aspart (NOVOLOG) 100 UNIT/ML injection Inject 0-15 Units into the skin 3 (three) times daily with meals. 10 mL 11  . insulin glargine (LANTUS) 100 UNIT/ML injection Inject 32 Units into the skin daily.    . Insulin Syringe-Needle U-100 (INSULIN SYRINGE .5CC/31GX5/16") 31G X 5/16" 0.5 ML MISC 16 Units by Does not apply route at bedtime. 200 each 0  . ketorolac (TORADOL) 10 MG tablet Take 1 tablet (10 mg total) by mouth every 8 (eight) hours as needed. 15 tablet 0  . levothyroxine (SYNTHROID, LEVOTHROID) 50 MCG tablet Take 50 mcg by mouth daily before breakfast.    . Semaglutide, 1 MG/DOSE, (OZEMPIC, 1 MG/DOSE,) 2 MG/1.5ML SOPN Inject 1 mg into the skin once a week.    . terazosin (HYTRIN) 5 MG capsule Take 1 capsule (5 mg total) by mouth 2 (two) times daily. [place]  capsule 3   No current facility-administered medications on file prior to visit.    Allergies  Allergen Reactions  . Food     PT EATS NO LAMB OR GOAT.    No results found for this or any previous visit (from the past 2160 hour(s)).  Objective: General: Patient is awake, alert, and oriented x 3 and in no acute distress.  Integument: Skin is warm, dry and supple bilateral. Nails are tender, long, thickened and  dystrophic with subungual debris, consistent with onychomycosis, 1-5 bilateral. No signs of infection. No open lesions or preulcerative lesions present bilateral. Remaining integument unremarkable.  Vasculature:  Dorsalis Pedis pulse 1/4  bilateral. Posterior Tibial pulse  1/4 bilateral.  Capillary fill time <3 sec 1-5 bilateral. Positive hair growth to the level of the digits. Temperature gradient within normal limits. No varicosities present bilateral. No edema present bilateral.   Neurology: The patient has absent sensation measured with a 5.07/10g Semmes Weinstein Monofilament at all pedal sites bilateral . Vibratory sensation diminished bilateral with tuning fork. No Babinski sign present bilateral.   Musculoskeletal: Minimally symptomatic Bunions, planus, and hammertoe pedal deformities noted bilateral. Muscular strength 5/5 in all lower extremity muscular groups bilateral without pain on range of motion. No tenderness with calf compression bilateral.  Assessment and Plan: Problem List Items Addressed This Visit    None    Visit Diagnoses    Pain due to onychomycosis of toenails of both feet    -  Primary   Diabetic polyneuropathy associated with type 2 diabetes mellitus (HCC)       Bunion       Hammer toes of both feet         -Examined patient. -Discussed and educated patient on diabetic foot care, especially with  regards to the vascular, neurological and musculoskeletal systems. -Mechanically debrided all nails 1-5 bilateral using sterile nail nipper and filed with dremel without incident -Advised good supportive shoes daily for foot type and bunion cushions as tolerated -Patient to return  in 3 months for at risk foot care -Patient advised to call the office if any problems or questions arise in the meantime.  Landis Martins, DPM

## 2019-03-09 DIAGNOSIS — Z23 Encounter for immunization: Secondary | ICD-10-CM | POA: Diagnosis not present

## 2019-03-13 ENCOUNTER — Encounter: Payer: Medicare Other | Admitting: Internal Medicine

## 2019-04-03 ENCOUNTER — Ambulatory Visit: Payer: Medicare Other | Admitting: Sports Medicine

## 2019-04-04 IMAGING — CR DG ABDOMEN ACUTE W/ 1V CHEST
4 series · 4 of 4 positions shown · non-contrast
Comparison: Chest radiograph on 09/10/2015

CLINICAL DATA: Abdominal pain and diarrhea for several days.
Dizziness, chest discomfort, and fatigue.

EXAM:
DG ABDOMEN ACUTE W/ 1V CHEST

[chest pa]
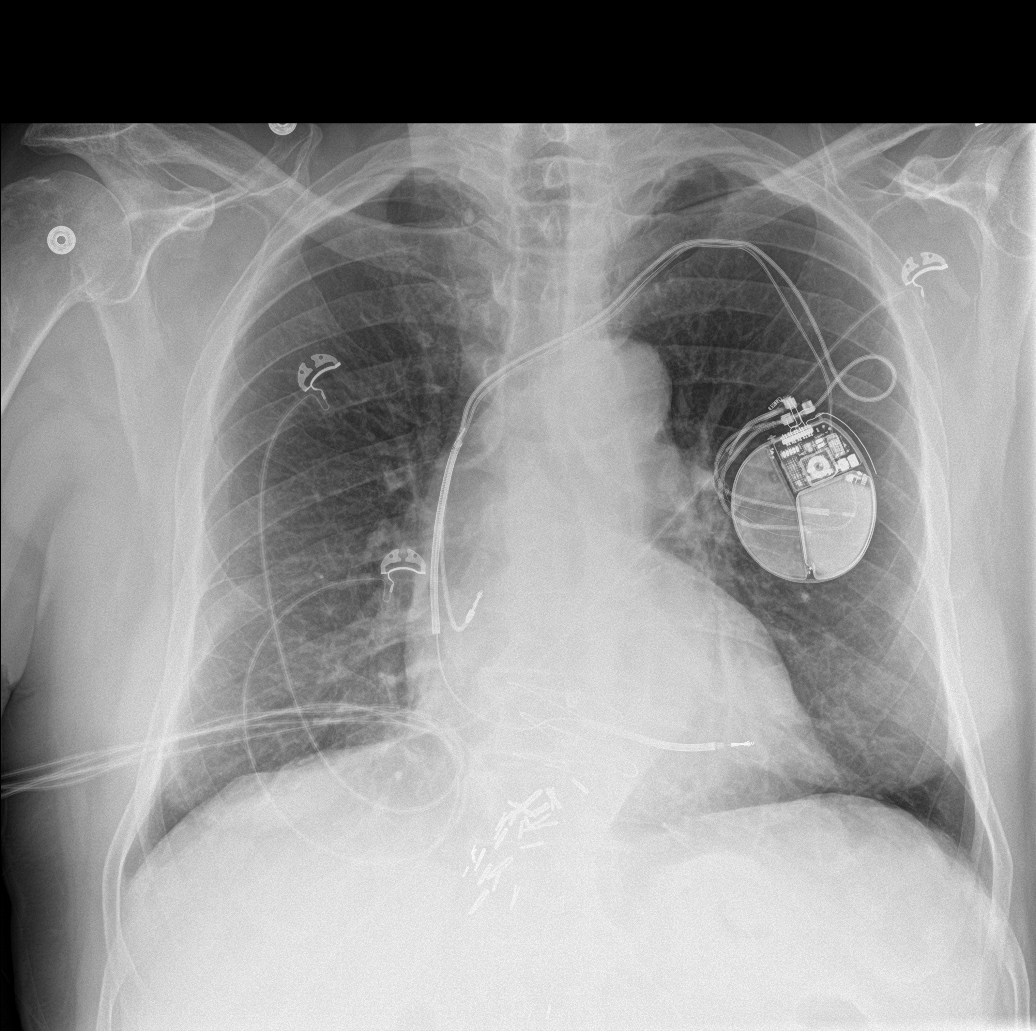

[abdomen erect]
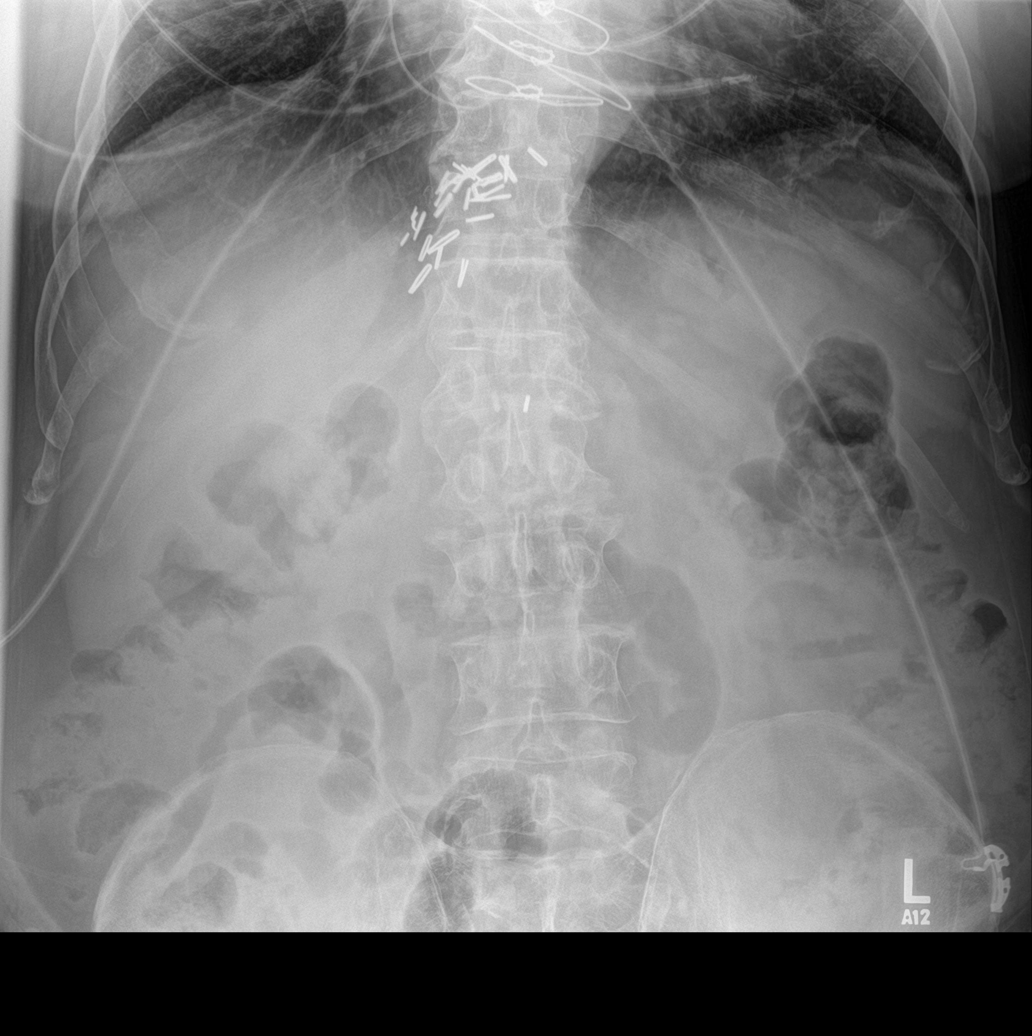

[abdomen supine (1 of 2)]
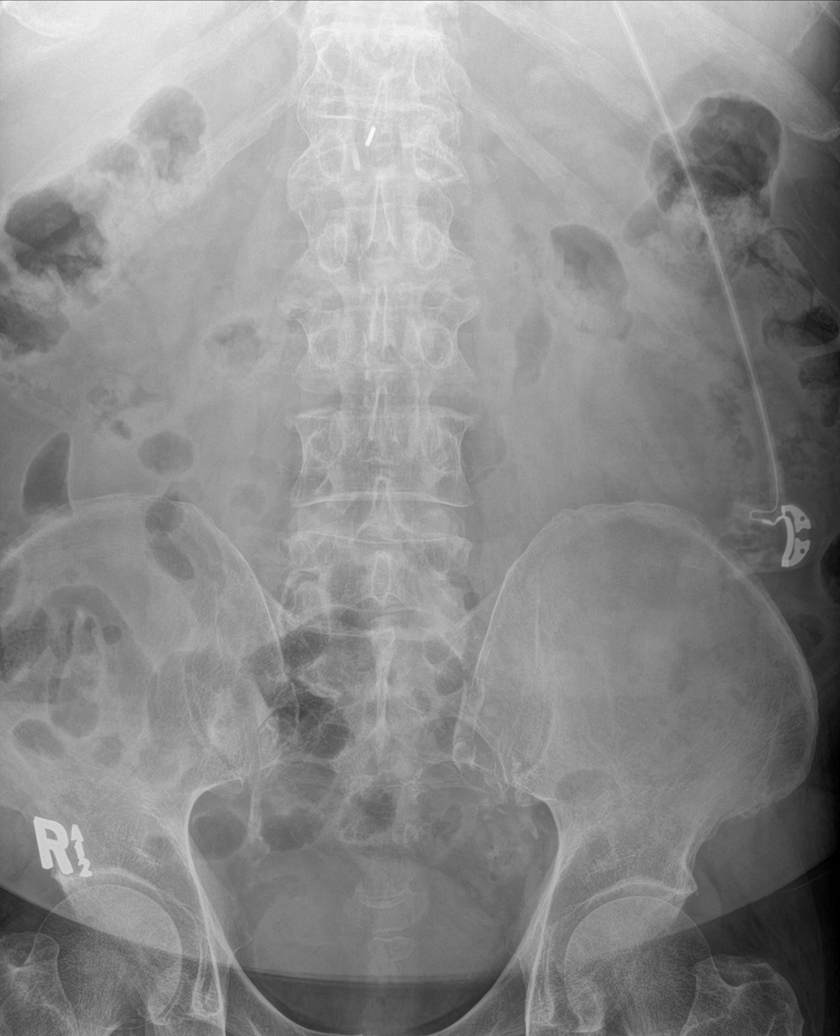

[abdomen supine (2 of 2)]
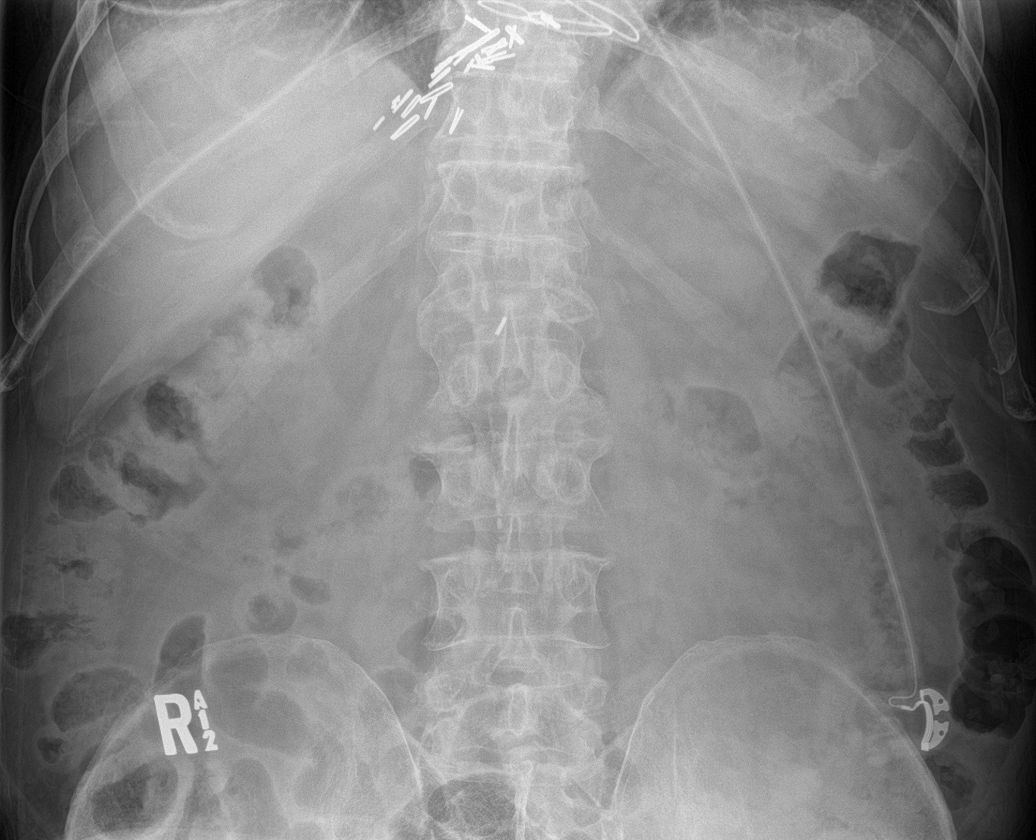

[4 of 4 positions shown; findings below may reference images not displayed]

FINDINGS: There is no evidence of dilated bowel loops or free intraperitoneal
air. No radiopaque calculi or other significant radiographic
abnormality is seen. Multiple surgical clips seen in the epigastric
region.

AICD remains in appropriate position. Heart size and mediastinal
contours are within normal limits. Stable ectasia and
atherosclerotic calcification of thoracic aorta. Both lungs are
clear.
IMPRESSION: Unremarkable bowel gas pattern.

No active cardiopulmonary disease.

## 2019-04-17 ENCOUNTER — Encounter: Payer: Self-pay | Admitting: Sports Medicine

## 2019-04-17 ENCOUNTER — Other Ambulatory Visit: Payer: Self-pay

## 2019-04-17 ENCOUNTER — Ambulatory Visit (INDEPENDENT_AMBULATORY_CARE_PROVIDER_SITE_OTHER): Payer: Medicare Other | Admitting: Sports Medicine

## 2019-04-17 DIAGNOSIS — B351 Tinea unguium: Secondary | ICD-10-CM | POA: Diagnosis not present

## 2019-04-17 DIAGNOSIS — M79674 Pain in right toe(s): Secondary | ICD-10-CM | POA: Diagnosis not present

## 2019-04-17 DIAGNOSIS — M79675 Pain in left toe(s): Secondary | ICD-10-CM

## 2019-04-17 DIAGNOSIS — E1142 Type 2 diabetes mellitus with diabetic polyneuropathy: Secondary | ICD-10-CM

## 2019-04-17 NOTE — Progress Notes (Signed)
Subjective: Grant [name] is a 73 y.o. male patient with history of diabetes who presents to office today complaining of long,mildly painful nails  while ambulating in shoes; unable to trim. Reports that FBS not recorded. Denies changes with medication. No new issues noted.   A1c 7  Patient Active Problem List   Diagnosis Date Noted  . Hyperglycemia 03/09/2017  . Hyperglycemia, unspecified 03/08/2017  . ICD (implantable cardioverter-defibrillator) in place 03/08/2017  . Acute kidney injury ([place]) 03/08/2017  . Abdominal pain   . Diabetes mellitus with complication ([place])   . Viral URI with cough 09/10/2015  . Upper airway cough syndrome 06/05/2015  . Hx of hepatitis C 05/06/2015  . Muscle cramp 05/06/2015  . Diet-controlled diabetes mellitus ([place]) 06/04/2014  . Essential hypertension 06/04/2014  . Benign prostatic hyperplasia 06/04/2014  . Tobacco abuse 06/04/2014  . Osteoarthritis 06/04/2014  . Hypothyroidism 06/04/2014  . CAD (coronary artery disease) 06/04/2014   Current Outpatient Medications on File Prior to Visit  Medication Sig Dispense Refill  . amiodarone (PACERONE) 200 MG tablet Take 200 mg by mouth 2 (two) times daily.     Marland Kitchen amitriptyline (ELAVIL) 25 MG tablet TAKE 1 TABLET BY MOUTH EVERYDAY AT BEDTIME FOR 5 NIGHTS THEN TAKE 2 TABLETS DAILY FOR 5 NIGHTS THEN 3 TABLETS EVERY NIGHT    . apixaban (ELIQUIS) 5 MG TABS tablet Take 5 mg by mouth 2 (two) times daily.    Marland Kitchen aspirin EC 81 MG tablet Take 81 mg by mouth daily.    Marland Kitchen atorvastatin (LIPITOR) 40 MG tablet Take 40 mg by mouth at bedtime.     . blood glucose meter kit and supplies KIT Dispense based on patient and insurance preference. Use up to four times daily as directed. (FOR ICD-9 250.00, 250.01). 1 each 0  . Cholecalciferol (VITAMIN D3) 2000 units TABS Take 2,000 Units by mouth daily.    . cyclobenzaprine (FLEXERIL) 10 MG tablet Take 10 mg by mouth at bedtime as needed for muscle spasms.    Marland Kitchen escitalopram  (LEXAPRO) 10 MG tablet Take 10 mg by mouth at bedtime.    . fluticasone (FLONASE) 50 MCG/ACT nasal spray Place 2 sprays into both nostrils daily. 16 g 2  . gabapentin (NEURONTIN) 300 MG capsule TAKE 1 CAPSULE BY MOUTH EVERY DAY AT BEDTIME FOR 5 DAYS THEN TAKE 1 CAPSULE TWICE DAILY FOR 5 DAYS THEN TAKE 1 CAPSULE THREE TIMES DAILY    . HYDROcodone-acetaminophen (NORCO/VICODIN) 5-325 MG tablet Take 1 tablet by mouth 3 (three) times daily as needed for moderate pain.     Marland Kitchen insulin aspart (NOVOLOG) 100 UNIT/ML injection Inject 0-15 Units into the skin 3 (three) times daily with meals. 10 mL 11  . insulin glargine (LANTUS) 100 UNIT/ML injection Inject 32 Units into the skin daily.    . Insulin Syringe-Needle U-100 (INSULIN SYRINGE .5CC/31GX5/16") 31G X 5/16" 0.5 ML MISC 16 Units by Does not apply route at bedtime. 200 each 0  . ketorolac (TORADOL) 10 MG tablet Take 1 tablet (10 mg total) by mouth every 8 (eight) hours as needed. 15 tablet 0  . levothyroxine (SYNTHROID, LEVOTHROID) 50 MCG tablet Take 50 mcg by mouth daily before breakfast.    . Semaglutide, 1 MG/DOSE, (OZEMPIC, 1 MG/DOSE,) 2 MG/1.5ML SOPN Inject 1 mg into the skin once a week.    . terazosin (HYTRIN) 5 MG capsule Take 1 capsule (5 mg total) by mouth 2 (two) times daily. 180 capsule 3   No current facility-administered medications  on file prior to visit.    Allergies  Allergen Reactions  . Food     PT EATS NO LAMB OR GOAT.    No results found for this or any previous visit (from the past 2160 hour(s)).  Objective: General: Patient is awake, alert, and oriented x 3 and in no acute distress.  Integument: Skin is warm, dry and supple bilateral. Nails are tender, long, thickened and  dystrophic with subungual debris, consistent with onychomycosis, 1-5 bilateral. No signs of infection. No open lesions or preulcerative lesions present bilateral. Remaining integument unremarkable.  Vasculature:  Dorsalis Pedis pulse 1/4 bilateral.  Posterior Tibial pulse  1/4 bilateral.  Capillary fill time <3 sec 1-5 bilateral. Positive hair growth to the level of the digits. Temperature gradient within normal limits. No varicosities present bilateral. No edema present bilateral.   Neurology: The patient has absent sensation measured with a 5.07/10g Semmes Weinstein Monofilament at all pedal sites bilateral . Vibratory sensation diminished bilateral with tuning fork. No Babinski sign present bilateral.   Musculoskeletal: Minimally symptomatic Bunions, planus, and hammertoe pedal deformities noted bilateral. Muscular strength 5/5 in all lower extremity muscular groups bilateral without pain on range of motion. No tenderness with calf compression bilateral.  Assessment and Plan: Problem List Items Addressed This Visit    None    Visit Diagnoses    Pain due to onychomycosis of toenails of both feet    -  Primary   Diabetic polyneuropathy associated with type 2 diabetes mellitus ([place])         -Examined patient. -Discussed and educated patient on diabetic foot care, especially with  regards to the vascular, neurological and musculoskeletal systems. -Mechanically debrided all nails 1-5 bilateral using sterile nail nipper and filed with dremel without incident -Advised good supportive shoes daily for foot type toe spacers as dispensed this visit -Patient to return  in 3 months for at risk foot care -Patient advised to call the office if any problems or questions arise in the meantime.  Landis Martins, DPM

## 2019-05-14 ENCOUNTER — Other Ambulatory Visit: Payer: Self-pay

## 2019-07-17 ENCOUNTER — Ambulatory Visit: Payer: Medicare Other | Admitting: Sports Medicine

## 2019-08-16 ENCOUNTER — Ambulatory Visit: Payer: Medicare Other | Attending: Internal Medicine

## 2019-08-16 DIAGNOSIS — Z23 Encounter for immunization: Secondary | ICD-10-CM | POA: Insufficient documentation

## 2019-08-16 NOTE — Progress Notes (Signed)
   Covid-19 Vaccination [facility]  Name:  Grant [name]    MRN: [id number] DOB: [birth year]  08/16/2019  Grant [name] was observed post Covid-19 immunization for 15 minutes without incidence. He was provided with Vaccine Information Sheet and instruction to access the V-Safe system.   Grant [name] was instructed to call 911 with any severe reactions post vaccine: Marland Kitchen Difficulty breathing  . Swelling of your face and throat  . A fast heartbeat  . A bad rash all over your body  . Dizziness and weakness    Immunizations Administered    Name Date Dose VIS Date Route   Pfizer COVID-19 Vaccine 08/16/2019  8:13 AM 0.3 mL 06/01/2019 Intramuscular   Manufacturer: [employer], [employer]   Lot: [id number]   NDC: [address]-3

## 2019-08-28 ENCOUNTER — Encounter: Payer: Self-pay | Admitting: Sports Medicine

## 2019-08-28 ENCOUNTER — Other Ambulatory Visit: Payer: Self-pay

## 2019-08-28 ENCOUNTER — Ambulatory Visit (INDEPENDENT_AMBULATORY_CARE_PROVIDER_SITE_OTHER): Payer: Medicare Other | Admitting: Sports Medicine

## 2019-08-28 DIAGNOSIS — M79675 Pain in left toe(s): Secondary | ICD-10-CM | POA: Diagnosis not present

## 2019-08-28 DIAGNOSIS — E1142 Type 2 diabetes mellitus with diabetic polyneuropathy: Secondary | ICD-10-CM

## 2019-08-28 DIAGNOSIS — M79674 Pain in right toe(s): Secondary | ICD-10-CM

## 2019-08-28 DIAGNOSIS — B351 Tinea unguium: Secondary | ICD-10-CM | POA: Diagnosis not present

## 2019-08-28 NOTE — Progress Notes (Signed)
Subjective: Grant [name] is a 74 y.o. male patient with history of diabetes who presents to office today complaining of long,mildly painful nails  while ambulating in shoes; unable to trim. Reports that FBS was 119. Denies changes with medication. No new issues noted.   A1c 7  Patient Active Problem List   Diagnosis Date Noted  . Hyperglycemia 03/09/2017  . Hyperglycemia, unspecified 03/08/2017  . ICD (implantable cardioverter-defibrillator) in place 03/08/2017  . Acute kidney injury ([place]) 03/08/2017  . Abdominal pain   . Diabetes mellitus with complication ([place])   . Viral URI with cough 09/10/2015  . Upper airway cough syndrome 06/05/2015  . Hx of hepatitis C 05/06/2015  . Muscle cramp 05/06/2015  . Diet-controlled diabetes mellitus ([place]) 06/04/2014  . Essential hypertension 06/04/2014  . Benign prostatic hyperplasia 06/04/2014  . Tobacco abuse 06/04/2014  . Osteoarthritis 06/04/2014  . Hypothyroidism 06/04/2014  . CAD (coronary artery disease) 06/04/2014   Current Outpatient Medications on File Prior to Visit  Medication Sig Dispense Refill  . amiodarone (PACERONE) 200 MG tablet Take 200 mg by mouth 2 (two) times daily.     Marland Kitchen amitriptyline (ELAVIL) 25 MG tablet TAKE 1 TABLET BY MOUTH EVERYDAY AT BEDTIME FOR 5 NIGHTS THEN TAKE 2 TABLETS DAILY FOR 5 NIGHTS THEN 3 TABLETS EVERY NIGHT    . apixaban (ELIQUIS) 5 MG TABS tablet Take 5 mg by mouth 2 (two) times daily.    Marland Kitchen aspirin EC 81 MG tablet Take 81 mg by mouth daily.    Marland Kitchen atorvastatin (LIPITOR) 40 MG tablet Take 40 mg by mouth at bedtime.     . blood glucose meter kit and supplies KIT Dispense based on patient and insurance preference. Use up to four times daily as directed. (FOR ICD-9 250.00, 250.01). 1 each 0  . Cholecalciferol (VITAMIN D3) 2000 units TABS Take 2,000 Units by mouth daily.    . cyclobenzaprine (FLEXERIL) 10 MG tablet Take 10 mg by mouth at bedtime as needed for muscle spasms.    Marland Kitchen escitalopram (LEXAPRO) 10  MG tablet Take 10 mg by mouth at bedtime.    . fluticasone (FLONASE) 50 MCG/ACT nasal spray Place 2 sprays into both nostrils daily. 16 g 2  . gabapentin (NEURONTIN) 300 MG capsule TAKE 1 CAPSULE BY MOUTH EVERY DAY AT BEDTIME FOR 5 DAYS THEN TAKE 1 CAPSULE TWICE DAILY FOR 5 DAYS THEN TAKE 1 CAPSULE THREE TIMES DAILY    . HYDROcodone-acetaminophen (NORCO/VICODIN) 5-325 MG tablet Take 1 tablet by mouth 3 (three) times daily as needed for moderate pain.     Marland Kitchen insulin aspart (NOVOLOG) 100 UNIT/ML injection Inject 0-15 Units into the skin 3 (three) times daily with meals. 10 mL 11  . insulin glargine (LANTUS) 100 UNIT/ML injection Inject 32 Units into the skin daily.    . Insulin Syringe-Needle U-100 (INSULIN SYRINGE .5CC/31GX5/16") 31G X 5/16" 0.5 ML MISC 16 Units by Does not apply route at bedtime. 200 each 0  . ketorolac (TORADOL) 10 MG tablet Take 1 tablet (10 mg total) by mouth every 8 (eight) hours as needed. 15 tablet 0  . levothyroxine (SYNTHROID, LEVOTHROID) 50 MCG tablet Take 50 mcg by mouth daily before breakfast.    . Semaglutide, 1 MG/DOSE, (OZEMPIC, 1 MG/DOSE,) 2 MG/1.5ML SOPN Inject 1 mg into the skin once a week.    . terazosin (HYTRIN) 5 MG capsule Take 1 capsule (5 mg total) by mouth 2 (two) times daily. 180 capsule 3   No current facility-administered medications  on file prior to visit.   Allergies  Allergen Reactions  . Food     PT EATS NO LAMB OR GOAT.    No results found for this or any previous visit (from the past 2160 hour(s)).  Objective: General: Patient is awake, alert, and oriented x 3 and in no acute distress.  Integument: Skin is warm, dry and supple bilateral. Nails are tender, long, thickened and  dystrophic with subungual debris, consistent with onychomycosis, 1-5 bilateral. No signs of infection. No open lesions or preulcerative lesions present bilateral. Remaining integument unremarkable.  Vasculature:  Dorsalis Pedis pulse 1/4 bilateral. Posterior Tibial  pulse  1/4 bilateral.  Capillary fill time <3 sec 1-5 bilateral. Positive hair growth to the level of the digits. Temperature gradient within normal limits. No varicosities present bilateral. No edema present bilateral.   Neurology: The patient has absent sensation measured with a 5.07/10g Semmes Weinstein Monofilament at all pedal sites bilateral . Vibratory sensation diminished bilateral with tuning fork. No Babinski sign present bilateral.   Musculoskeletal: Minimally symptomatic Bunions, planus, and hammertoe pedal deformities noted bilateral. Muscular strength 5/5 in all lower extremity muscular groups bilateral without pain on range of motion. No tenderness with calf compression bilateral.  Assessment and Plan: Problem List Items Addressed This Visit    None    Visit Diagnoses    Pain due to onychomycosis of toenails of both feet    -  Primary   Diabetic polyneuropathy associated with type 2 diabetes mellitus ([place])         -Examined patient. -Re-discussed and educated patient on diabetic foot care, especially with  regards to the vascular, neurological and musculoskeletal systems. -Mechanically debrided all nails 1-5 bilateral using sterile nail nipper and filed with dremel without incident -Advised good supportive shoes daily for foot type -Patient to return  in 3 months for at risk foot care -Patient advised to call the office if any problems or questions arise in the meantime.  Landis Martins, DPM

## 2019-09-05 ENCOUNTER — Ambulatory Visit: Payer: Medicare Other | Attending: Internal Medicine

## 2019-09-05 DIAGNOSIS — Z23 Encounter for immunization: Secondary | ICD-10-CM

## 2019-09-05 NOTE — Progress Notes (Signed)
   Covid-19 Vaccination Clinic  Name:  Cordarryl Monrreal    MRN: [id number] DOB: 18-Feb-1946  09/05/2019  Mr. Fife was observed post Covid-19 immunization for 15 minutes without incident. He was provided with Vaccine Information Sheet and instruction to access the V-Safe system.   Mr. Lacock was instructed to call 911 with any severe reactions post vaccine: Marland Kitchen Difficulty breathing  . Swelling of face and throat  . A fast heartbeat  . A bad rash all over body  . Dizziness and weakness   Immunizations Administered    Name Date Dose VIS Date Route   Pfizer COVID-19 Vaccine 09/05/2019 10:36 AM 0.3 mL 06/01/2019 Intramuscular   Manufacturer: [employer], [employer]   Lot: [id number]   NDC: [address]-4

## 2019-12-04 ENCOUNTER — Ambulatory Visit: Payer: Medicare Other | Admitting: Sports Medicine

## 2019-12-11 ENCOUNTER — Other Ambulatory Visit: Payer: Self-pay

## 2019-12-11 ENCOUNTER — Encounter: Payer: Self-pay | Admitting: Sports Medicine

## 2019-12-11 ENCOUNTER — Ambulatory Visit (INDEPENDENT_AMBULATORY_CARE_PROVIDER_SITE_OTHER): Payer: Medicare Other | Admitting: Sports Medicine

## 2019-12-11 DIAGNOSIS — B351 Tinea unguium: Secondary | ICD-10-CM | POA: Diagnosis not present

## 2019-12-11 DIAGNOSIS — M21619 Bunion of unspecified foot: Secondary | ICD-10-CM

## 2019-12-11 DIAGNOSIS — M2041 Other hammer toe(s) (acquired), right foot: Secondary | ICD-10-CM

## 2019-12-11 DIAGNOSIS — M79675 Pain in left toe(s): Secondary | ICD-10-CM | POA: Diagnosis not present

## 2019-12-11 DIAGNOSIS — M2042 Other hammer toe(s) (acquired), left foot: Secondary | ICD-10-CM

## 2019-12-11 DIAGNOSIS — E1142 Type 2 diabetes mellitus with diabetic polyneuropathy: Secondary | ICD-10-CM | POA: Diagnosis not present

## 2019-12-11 DIAGNOSIS — M79674 Pain in right toe(s): Secondary | ICD-10-CM | POA: Diagnosis not present

## 2019-12-11 NOTE — Progress Notes (Signed)
Subjective: Grant [name] is a 74 y.o. male patient with history of diabetes who presents to office today complaining of long,mildly painful nails while ambulating in shoes; unable to trim. Reports that FBS was not recorded but last week was around 150. Denies changes with medication. No new issues noted.   A1c 7 like previous.  Patient is assisted by wife this visit.   Patient Active Problem List   Diagnosis Date Noted  . Hyperglycemia 03/09/2017  . Hyperglycemia, unspecified 03/08/2017  . ICD (implantable cardioverter-defibrillator) in place 03/08/2017  . Acute kidney injury ([place]) 03/08/2017  . Abdominal pain   . Diabetes mellitus with complication ([place])   . Viral URI with cough 09/10/2015  . Upper airway cough syndrome 06/05/2015  . Hx of hepatitis C 05/06/2015  . Muscle cramp 05/06/2015  . Diet-controlled diabetes mellitus ([place]) 06/04/2014  . Essential hypertension 06/04/2014  . Benign prostatic hyperplasia 06/04/2014  . Tobacco abuse 06/04/2014  . Osteoarthritis 06/04/2014  . Hypothyroidism 06/04/2014  . CAD (coronary artery disease) 06/04/2014   Current Outpatient Medications on File Prior to Visit  Medication Sig Dispense Refill  . amiodarone (PACERONE) 200 MG tablet Take 200 mg by mouth 2 (two) times daily.     Marland Kitchen amitriptyline (ELAVIL) 25 MG tablet TAKE 1 TABLET BY MOUTH EVERYDAY AT BEDTIME FOR 5 NIGHTS THEN TAKE 2 TABLETS DAILY FOR 5 NIGHTS THEN 3 TABLETS EVERY NIGHT    . apixaban (ELIQUIS) 5 MG TABS tablet Take 5 mg by mouth 2 (two) times daily.    Marland Kitchen aspirin EC 81 MG tablet Take 81 mg by mouth daily.    Marland Kitchen atorvastatin (LIPITOR) 40 MG tablet Take 40 mg by mouth at bedtime.     . blood glucose meter kit and supplies KIT Dispense based on patient and insurance preference. Use up to four times daily as directed. (FOR ICD-9 250.00, 250.01). 1 each 0  . Cholecalciferol (VITAMIN D3) 2000 units TABS Take 2,000 Units by mouth daily.    . cyclobenzaprine (FLEXERIL) 10 MG  tablet Take 10 mg by mouth at bedtime as needed for muscle spasms.    Marland Kitchen escitalopram (LEXAPRO) 10 MG tablet Take 10 mg by mouth at bedtime.    . fluticasone (FLONASE) 50 MCG/ACT nasal spray Place 2 sprays into both nostrils daily. 16 g 2  . gabapentin (NEURONTIN) 300 MG capsule TAKE 1 CAPSULE BY MOUTH EVERY DAY AT BEDTIME FOR 5 DAYS THEN TAKE 1 CAPSULE TWICE DAILY FOR 5 DAYS THEN TAKE 1 CAPSULE THREE TIMES DAILY    . HYDROcodone-acetaminophen (NORCO/VICODIN) 5-325 MG tablet Take 1 tablet by mouth 3 (three) times daily as needed for moderate pain.     Marland Kitchen insulin aspart (NOVOLOG) 100 UNIT/ML injection Inject 0-15 Units into the skin 3 (three) times daily with meals. 10 mL 11  . insulin glargine (LANTUS) 100 UNIT/ML injection Inject 32 Units into the skin daily.    . Insulin Syringe-Needle U-100 (INSULIN SYRINGE .5CC/31GX5/16") 31G X 5/16" 0.5 ML MISC 16 Units by Does not apply route at bedtime. 200 each 0  . ketorolac (TORADOL) 10 MG tablet Take 1 tablet (10 mg total) by mouth every 8 (eight) hours as needed. 15 tablet 0  . levothyroxine (SYNTHROID, LEVOTHROID) 50 MCG tablet Take 50 mcg by mouth daily before breakfast.    . Semaglutide, 1 MG/DOSE, (OZEMPIC, 1 MG/DOSE,) 2 MG/1.5ML SOPN Inject 1 mg into the skin once a week.    . terazosin (HYTRIN) 5 MG capsule Take 1 capsule (5  mg total) by mouth 2 (two) times daily. 180 capsule 3   No current facility-administered medications on file prior to visit.   Allergies  Allergen Reactions  . Food     PT EATS NO LAMB OR GOAT.    No results found for this or any previous visit (from the past 2160 hour(s)).  Objective: General: Patient is awake, alert, and oriented x 3 and in no acute distress.  Integument: Skin is warm, dry and supple bilateral. Nails are tender, long, thickened and dystrophic with subungual debris, consistent with onychomycosis, 1-5 bilateral. Remaining integument unremarkable.  Vasculature:  Dorsalis Pedis pulse 1/4 bilateral.  Posterior Tibial pulse  1/4 bilateral. Capillary fill time <3 sec 1-5 bilateral. No edema bilateral.  Neurology: Protective sensation diminished bilateral.   Musculoskeletal:  Bunions, planus, and hammertoe pedal deformities noted bilateral with crossover toes 2-3 on right. Muscular strength 5/5 in all lower extremity muscular groups bilateral without pain on range of motion. No tenderness with calf compression bilateral.  Assessment and Plan: Problem List Items Addressed This Visit    None    Visit Diagnoses    Pain due to onychomycosis of toenails of both feet    -  Primary   Diabetic polyneuropathy associated with type 2 diabetes mellitus ([facility])       Bunion       Hammer toes of both feet         -Examined patient. -Re-discussed and educated patient on diabetic foot care -Mechanically debrided all nails 1-5 bilateral using sterile nail nipper and filed with dremel without incident -Advised good supportive shoes daily for foot type and dispensed toe separators for right foot 2-3 toes -Patient to return in 3 months for at risk foot care -Patient advised to call the office if any problems or questions arise in the meantime.  Landis Martins, DPM

## 2020-03-13 ENCOUNTER — Ambulatory Visit (INDEPENDENT_AMBULATORY_CARE_PROVIDER_SITE_OTHER): Payer: Medicare Other | Admitting: Sports Medicine

## 2020-03-13 ENCOUNTER — Other Ambulatory Visit: Payer: Self-pay

## 2020-03-13 ENCOUNTER — Encounter: Payer: Self-pay | Admitting: Sports Medicine

## 2020-03-13 DIAGNOSIS — M79674 Pain in right toe(s): Secondary | ICD-10-CM

## 2020-03-13 DIAGNOSIS — E1142 Type 2 diabetes mellitus with diabetic polyneuropathy: Secondary | ICD-10-CM | POA: Diagnosis not present

## 2020-03-13 DIAGNOSIS — B351 Tinea unguium: Secondary | ICD-10-CM

## 2020-03-13 DIAGNOSIS — M79675 Pain in left toe(s): Secondary | ICD-10-CM

## 2020-03-13 NOTE — Progress Notes (Signed)
Subjective: Grant [name] is a 74 y.o. male patient with history of diabetes who presents to office today complaining of long,mildly painful nails while ambulating in shoes; unable to trim. Reports that FBS was not recorded. Denies changes with medication. No new issues noted.   Patient is assisted by wife this visit.   Patient Active Problem List   Diagnosis Date Noted  . Hyperglycemia 03/09/2017  . Hyperglycemia, unspecified 03/08/2017  . ICD (implantable cardioverter-defibrillator) in place 03/08/2017  . Acute kidney injury ([place]) 03/08/2017  . Abdominal pain   . Diabetes mellitus with complication ([place])   . Viral URI with cough 09/10/2015  . Upper airway cough syndrome 06/05/2015  . Hx of hepatitis C 05/06/2015  . Muscle cramp 05/06/2015  . Diet-controlled diabetes mellitus ([place]) 06/04/2014  . Essential hypertension 06/04/2014  . Benign prostatic hyperplasia 06/04/2014  . Tobacco abuse 06/04/2014  . Osteoarthritis 06/04/2014  . Hypothyroidism 06/04/2014  . CAD (coronary artery disease) 06/04/2014   Current Outpatient Medications on File Prior to Visit  Medication Sig Dispense Refill  . amiodarone (PACERONE) 200 MG tablet Take 200 mg by mouth 2 (two) times daily.     Marland Kitchen amitriptyline (ELAVIL) 25 MG tablet TAKE 1 TABLET BY MOUTH EVERYDAY AT BEDTIME FOR 5 NIGHTS THEN TAKE 2 TABLETS DAILY FOR 5 NIGHTS THEN 3 TABLETS EVERY NIGHT    . apixaban (ELIQUIS) 5 MG TABS tablet Take 5 mg by mouth 2 (two) times daily.    Marland Kitchen aspirin EC 81 MG tablet Take 81 mg by mouth daily.    Marland Kitchen atorvastatin (LIPITOR) 40 MG tablet Take 40 mg by mouth at bedtime.     . blood glucose meter kit and supplies KIT Dispense based on patient and insurance preference. Use up to four times daily as directed. (FOR ICD-9 250.00, 250.01). 1 each 0  . Cholecalciferol (VITAMIN D3) 2000 units TABS Take 2,000 Units by mouth daily.    . cyclobenzaprine (FLEXERIL) 10 MG tablet Take 10 mg by mouth at bedtime as needed for  muscle spasms.    Marland Kitchen escitalopram (LEXAPRO) 10 MG tablet Take 10 mg by mouth at bedtime.    . fluticasone (FLONASE) 50 MCG/ACT nasal spray Place 2 sprays into both nostrils daily. 16 g 2  . gabapentin (NEURONTIN) 300 MG capsule TAKE 1 CAPSULE BY MOUTH EVERY DAY AT BEDTIME FOR 5 DAYS THEN TAKE 1 CAPSULE TWICE DAILY FOR 5 DAYS THEN TAKE 1 CAPSULE THREE TIMES DAILY    . HYDROcodone-acetaminophen (NORCO/VICODIN) 5-325 MG tablet Take 1 tablet by mouth 3 (three) times daily as needed for moderate pain.     Marland Kitchen insulin aspart (NOVOLOG) 100 UNIT/ML injection Inject 0-15 Units into the skin 3 (three) times daily with meals. 10 mL 11  . insulin glargine (LANTUS) 100 UNIT/ML injection Inject 32 Units into the skin daily.    . Insulin Syringe-Needle U-100 (INSULIN SYRINGE .5CC/31GX5/16") 31G X 5/16" 0.5 ML MISC 16 Units by Does not apply route at bedtime. 200 each 0  . ketorolac (TORADOL) 10 MG tablet Take 1 tablet (10 mg total) by mouth every 8 (eight) hours as needed. 15 tablet 0  . levothyroxine (SYNTHROID, LEVOTHROID) 50 MCG tablet Take 50 mcg by mouth daily before breakfast.    . Semaglutide, 1 MG/DOSE, (OZEMPIC, 1 MG/DOSE,) 2 MG/1.5ML SOPN Inject 1 mg into the skin once a week.    . terazosin (HYTRIN) 5 MG capsule Take 1 capsule (5 mg total) by mouth 2 (two) times daily. 180 capsule 3  No current facility-administered medications on file prior to visit.   Allergies  Allergen Reactions  . Food     PT EATS NO LAMB OR GOAT.    No results found for this or any previous visit (from the past 2160 hour(s)).  Objective: General: Patient is awake, alert, and oriented x 3 and in no acute distress.  Integument: Skin is warm, dry and supple bilateral. Nails are tender, long, thickened and dystrophic with subungual debris, consistent with onychomycosis, 1-5 bilateral. Remaining integument unremarkable.  Vasculature:  Dorsalis Pedis pulse 1/4 bilateral. Posterior Tibial pulse  1/4 bilateral. Capillary fill  time <3 sec 1-5 bilateral. No edema bilateral.  Neurology: Protective sensation diminished bilateral.   Musculoskeletal:  Bunions, planus, and hammertoe pedal deformities noted bilateral with crossover toes 2-3 on right. Muscular strength 5/5 in all lower extremity muscular groups bilateral without pain on range of motion. No tenderness with calf compression bilateral.  Assessment and Plan: Problem List Items Addressed This Visit    None    Visit Diagnoses    Pain due to onychomycosis of toenails of both feet    -  Primary   Diabetic polyneuropathy associated with type 2 diabetes mellitus ([place])         -Examined patient. -Re-discussed and educated patient on diabetic foot care -Mechanically debrided all nails 1-5 bilateral using sterile nail nipper and filed with dremel without incident -Patient to return in 3 months for at risk foot care -Patient advised to call the office if any problems or questions arise in the meantime.  Landis Martins, DPM

## 2020-06-19 ENCOUNTER — Ambulatory Visit: Payer: Medicare Other | Admitting: Sports Medicine

## 2020-06-26 ENCOUNTER — Ambulatory Visit: Payer: Medicare Other | Admitting: Sports Medicine

## 2020-09-04 ENCOUNTER — Ambulatory Visit (INDEPENDENT_AMBULATORY_CARE_PROVIDER_SITE_OTHER): Payer: Medicare (Managed Care) | Admitting: Sports Medicine

## 2020-09-04 ENCOUNTER — Ambulatory Visit (INDEPENDENT_AMBULATORY_CARE_PROVIDER_SITE_OTHER): Payer: Medicare (Managed Care)

## 2020-09-04 ENCOUNTER — Other Ambulatory Visit: Payer: Self-pay

## 2020-09-04 ENCOUNTER — Encounter: Payer: Self-pay | Admitting: Sports Medicine

## 2020-09-04 DIAGNOSIS — M2042 Other hammer toe(s) (acquired), left foot: Secondary | ICD-10-CM

## 2020-09-04 DIAGNOSIS — M2041 Other hammer toe(s) (acquired), right foot: Secondary | ICD-10-CM

## 2020-09-04 DIAGNOSIS — M779 Enthesopathy, unspecified: Secondary | ICD-10-CM

## 2020-09-04 DIAGNOSIS — M1000 Idiopathic gout, unspecified site: Secondary | ICD-10-CM

## 2020-09-04 DIAGNOSIS — M21612 Bunion of left foot: Secondary | ICD-10-CM

## 2020-09-04 DIAGNOSIS — M21619 Bunion of unspecified foot: Secondary | ICD-10-CM | POA: Diagnosis not present

## 2020-09-04 DIAGNOSIS — M79672 Pain in left foot: Secondary | ICD-10-CM

## 2020-09-04 MED ORDER — DICLOFENAC SODIUM 1 % EX GEL: 4.0000 g | Freq: Four times a day (QID) | CUTANEOUS | 1 refills | Status: DC

## 2020-09-04 NOTE — Progress Notes (Signed)
Subjective: Grant [name] is a 75 y.o. male patient who presents to office for evaluation of Left>Right foot pain. Patient complains of a flareup of pain over his bunions.  Patient reports that when he wears certain shoes they rub the big toe joint and he has noticed that over time his toes have continued to curl.  Reports that his blood sugar is elevated last A1c recorded at 10.  Patient Active Problem List   Diagnosis Date Noted  . Hyperglycemia 03/09/2017  . Hyperglycemia, unspecified 03/08/2017  . ICD (implantable cardioverter-defibrillator) in place 03/08/2017  . Acute kidney injury ([place]) 03/08/2017  . Abdominal pain   . Diabetes mellitus with complication ([place])   . Viral URI with cough 09/10/2015  . Upper airway cough syndrome 06/05/2015  . Hx of hepatitis C 05/06/2015  . Muscle cramp 05/06/2015  . Diet-controlled diabetes mellitus ([place]) 06/04/2014  . Essential hypertension 06/04/2014  . Benign prostatic hyperplasia 06/04/2014  . Tobacco abuse 06/04/2014  . Osteoarthritis 06/04/2014  . Hypothyroidism 06/04/2014  . CAD (coronary artery disease) 06/04/2014    Current Outpatient Medications on File Prior to Visit  Medication Sig Dispense Refill  . amiodarone (PACERONE) 200 MG tablet Take 200 mg by mouth 2 (two) times daily.     Marland Kitchen amitriptyline (ELAVIL) 25 MG tablet TAKE 1 TABLET BY MOUTH EVERYDAY AT BEDTIME FOR 5 NIGHTS THEN TAKE 2 TABLETS DAILY FOR 5 NIGHTS THEN 3 TABLETS EVERY NIGHT    . apixaban (ELIQUIS) 5 MG TABS tablet Take 5 mg by mouth 2 (two) times daily.    Marland Kitchen aspirin EC 81 MG tablet Take 81 mg by mouth daily.    Marland Kitchen atorvastatin (LIPITOR) 40 MG tablet Take 40 mg by mouth at bedtime.     . blood glucose meter kit and supplies KIT Dispense based on patient and insurance preference. Use up to four times daily as directed. (FOR ICD-9 250.00, 250.01). 1 each 0  . Cholecalciferol (VITAMIN D3) 2000 units TABS Take 2,000 Units by mouth daily.    . cyclobenzaprine (FLEXERIL)  10 MG tablet Take 10 mg by mouth at bedtime as needed for muscle spasms.    Marland Kitchen escitalopram (LEXAPRO) 10 MG tablet Take 10 mg by mouth at bedtime.    . fluticasone (FLONASE) 50 MCG/ACT nasal spray Place 2 sprays into both nostrils daily. 16 g 2  . gabapentin (NEURONTIN) 300 MG capsule TAKE 1 CAPSULE BY MOUTH EVERY DAY AT BEDTIME FOR 5 DAYS THEN TAKE 1 CAPSULE TWICE DAILY FOR 5 DAYS THEN TAKE 1 CAPSULE THREE TIMES DAILY    . HYDROcodone-acetaminophen (NORCO/VICODIN) 5-325 MG tablet Take 1 tablet by mouth 3 (three) times daily as needed for moderate pain.     Marland Kitchen insulin aspart (NOVOLOG) 100 UNIT/ML injection Inject 0-15 Units into the skin 3 (three) times daily with meals. 10 mL 11  . insulin glargine (LANTUS) 100 UNIT/ML injection Inject 32 Units into the skin daily.    . Insulin Syringe-Needle U-100 (INSULIN SYRINGE .5CC/31GX5/16") 31G X 5/16" 0.5 ML MISC 16 Units by Does not apply route at bedtime. 200 each 0  . ketorolac (TORADOL) 10 MG tablet Take 1 tablet (10 mg total) by mouth every 8 (eight) hours as needed. 15 tablet 0  . levothyroxine (SYNTHROID, LEVOTHROID) 50 MCG tablet Take 50 mcg by mouth daily before breakfast.    . Semaglutide, 1 MG/DOSE, (OZEMPIC, 1 MG/DOSE,) 2 MG/1.5ML SOPN Inject 1 mg into the skin once a week.    . terazosin (HYTRIN) 5  MG capsule Take 1 capsule (5 mg total) by mouth 2 (two) times daily. 180 capsule 3   No current facility-administered medications on file prior to visit.    Allergies  Allergen Reactions  . Food     PT EATS NO LAMB OR GOAT.    Objective:  General: Alert and oriented x3 in no acute distress  Dermatology: No open lesions bilateral lower extremities, no webspace macerations, no ecchymosis bilateral, all nails x 10 are short and thick consistent with onychomycosis.  Vascular: Dorsalis Pedis and Posterior Tibial pedal pulses palpable 1 out of 4, Capillary [name] Time 3 seconds,(+) pedal hair growth bilateral, no edema bilateral lower extremities,  Temperature gradient within normal limits.  Neurology: Johney Maine sensation intact via light touch bilateral, protective sensation diminished bilateral.  Musculoskeletal: Mild tenderness with palpation at left greater than right bunion with hammertoe deformity.     Xrays  Left and right foot   Impression: Changes consistent with bunion and hammertoe deformity there is mild erosions noted at the first metatarsal head suggestive of arthritis at bunion versus gout.  Assessment and Plan: Problem List Items Addressed This Visit   None   Visit Diagnoses    Hammertoes of both feet    -  Primary   Relevant Orders   DG Foot Complete Right   Pain of left foot       Relevant Orders   DG Foot Complete Left   Bunion       Capsulitis       Idiopathic gout, unspecified chronicity, unspecified site           -Complete examination performed -Xrays reviewed -Discussed treatment options -Rx arthritic panel for further evaluation -Advised patient to avoid shoes that could rub bunion or crowd his toes -Prescribed Voltaren topical for patient to use up to 4 times per day at areas of pain -Advised patient that he is not a surgery candidate at this time due to his hyperglycemia last A1c of 10 -Patient to return to office as needed or sooner if condition worsens.  Landis Martins, DPM

## 2020-09-18 ENCOUNTER — Other Ambulatory Visit: Payer: Self-pay | Admitting: Sports Medicine

## 2020-09-18 DIAGNOSIS — M2041 Other hammer toe(s) (acquired), right foot: Secondary | ICD-10-CM

## 2020-09-18 DIAGNOSIS — M21612 Bunion of left foot: Secondary | ICD-10-CM

## 2021-02-09 DIAGNOSIS — C220 Liver cell carcinoma: Secondary | ICD-10-CM | POA: Diagnosis present

## 2021-03-12 DIAGNOSIS — N1831 Chronic kidney disease, stage 3a: Secondary | ICD-10-CM | POA: Insufficient documentation

## 2021-03-12 DIAGNOSIS — G4733 Obstructive sleep apnea (adult) (pediatric): Secondary | ICD-10-CM | POA: Insufficient documentation

## 2021-03-12 DIAGNOSIS — I255 Ischemic cardiomyopathy: Secondary | ICD-10-CM | POA: Insufficient documentation

## 2021-03-12 DIAGNOSIS — J449 Chronic obstructive pulmonary disease, unspecified: Secondary | ICD-10-CM | POA: Insufficient documentation

## 2021-03-12 DIAGNOSIS — I70213 Atherosclerosis of native arteries of extremities with intermittent claudication, bilateral legs: Secondary | ICD-10-CM | POA: Insufficient documentation

## 2022-05-20 ENCOUNTER — Emergency Department (HOSPITAL_BASED_OUTPATIENT_CLINIC_OR_DEPARTMENT_OTHER): Payer: No Typology Code available for payment source

## 2022-05-20 ENCOUNTER — Encounter (HOSPITAL_COMMUNITY): Payer: Self-pay

## 2022-05-20 ENCOUNTER — Inpatient Hospital Stay (HOSPITAL_BASED_OUTPATIENT_CLINIC_OR_DEPARTMENT_OTHER)
Admission: EM | Admit: 2022-05-20 | Discharge: 2022-05-26 | DRG: 068 | Disposition: A | Discharge status: Rehabilitation Facility | Payer: No Typology Code available for payment source | Attending: Internal Medicine | Admitting: Internal Medicine

## 2022-05-20 ENCOUNTER — Other Ambulatory Visit: Payer: Self-pay

## 2022-05-20 ENCOUNTER — Encounter (HOSPITAL_BASED_OUTPATIENT_CLINIC_OR_DEPARTMENT_OTHER): Payer: Self-pay

## 2022-05-20 DIAGNOSIS — E118 Type 2 diabetes mellitus with unspecified complications: Secondary | ICD-10-CM | POA: Diagnosis present

## 2022-05-20 DIAGNOSIS — I110 Hypertensive heart disease with heart failure: Secondary | ICD-10-CM | POA: Diagnosis present

## 2022-05-20 DIAGNOSIS — E781 Pure hyperglyceridemia: Secondary | ICD-10-CM | POA: Diagnosis present

## 2022-05-20 DIAGNOSIS — Z8249 Family history of ischemic heart disease and other diseases of the circulatory system: Secondary | ICD-10-CM

## 2022-05-20 DIAGNOSIS — I639 Cerebral infarction, unspecified: Secondary | ICD-10-CM | POA: Diagnosis not present

## 2022-05-20 DIAGNOSIS — Z794 Long term (current) use of insulin: Secondary | ICD-10-CM

## 2022-05-20 DIAGNOSIS — F17210 Nicotine dependence, cigarettes, uncomplicated: Secondary | ICD-10-CM | POA: Diagnosis present

## 2022-05-20 DIAGNOSIS — I480 Paroxysmal atrial fibrillation: Secondary | ICD-10-CM | POA: Diagnosis present

## 2022-05-20 DIAGNOSIS — G8194 Hemiplegia, unspecified affecting left nondominant side: Secondary | ICD-10-CM | POA: Diagnosis present

## 2022-05-20 DIAGNOSIS — I6501 Occlusion and stenosis of right vertebral artery: Principal | ICD-10-CM | POA: Diagnosis present

## 2022-05-20 DIAGNOSIS — E669 Obesity, unspecified: Secondary | ICD-10-CM | POA: Diagnosis present

## 2022-05-20 DIAGNOSIS — Z91148 Patient's other noncompliance with medication regimen for other reason: Secondary | ICD-10-CM

## 2022-05-20 DIAGNOSIS — F39 Unspecified mood [affective] disorder: Secondary | ICD-10-CM | POA: Diagnosis present

## 2022-05-20 DIAGNOSIS — I482 Chronic atrial fibrillation, unspecified: Secondary | ICD-10-CM

## 2022-05-20 DIAGNOSIS — Z79899 Other long term (current) drug therapy: Secondary | ICD-10-CM

## 2022-05-20 DIAGNOSIS — I10 Essential (primary) hypertension: Secondary | ICD-10-CM | POA: Diagnosis present

## 2022-05-20 DIAGNOSIS — I5022 Chronic systolic (congestive) heart failure: Secondary | ICD-10-CM | POA: Diagnosis present

## 2022-05-20 DIAGNOSIS — Z6828 Body mass index (BMI) 28.0-28.9, adult: Secondary | ICD-10-CM

## 2022-05-20 DIAGNOSIS — I2510 Atherosclerotic heart disease of native coronary artery without angina pectoris: Secondary | ICD-10-CM | POA: Diagnosis present

## 2022-05-20 DIAGNOSIS — E1165 Type 2 diabetes mellitus with hyperglycemia: Secondary | ICD-10-CM | POA: Diagnosis present

## 2022-05-20 DIAGNOSIS — E039 Hypothyroidism, unspecified: Secondary | ICD-10-CM | POA: Diagnosis present

## 2022-05-20 DIAGNOSIS — I472 Ventricular tachycardia, unspecified: Secondary | ICD-10-CM | POA: Diagnosis present

## 2022-05-20 DIAGNOSIS — Z79890 Hormone replacement therapy (postmenopausal): Secondary | ICD-10-CM

## 2022-05-20 DIAGNOSIS — R531 Weakness: Secondary | ICD-10-CM

## 2022-05-20 DIAGNOSIS — R29704 NIHSS score 4: Secondary | ICD-10-CM | POA: Diagnosis present

## 2022-05-20 DIAGNOSIS — G4733 Obstructive sleep apnea (adult) (pediatric): Secondary | ICD-10-CM | POA: Diagnosis present

## 2022-05-20 DIAGNOSIS — Z7982 Long term (current) use of aspirin: Secondary | ICD-10-CM

## 2022-05-20 DIAGNOSIS — Z720 Tobacco use: Secondary | ICD-10-CM | POA: Diagnosis present

## 2022-05-20 DIAGNOSIS — N400 Enlarged prostate without lower urinary tract symptoms: Secondary | ICD-10-CM | POA: Diagnosis present

## 2022-05-20 DIAGNOSIS — Z95810 Presence of automatic (implantable) cardiac defibrillator: Secondary | ICD-10-CM | POA: Diagnosis present

## 2022-05-20 DIAGNOSIS — Z7901 Long term (current) use of anticoagulants: Secondary | ICD-10-CM

## 2022-05-20 LAB — CBC
HCT: 44.9 % (ref 39.0–52.0)
Hemoglobin: 15 g/dL (ref 13.0–17.0)
MCH: 29.6 pg (ref 26.0–34.0)
MCHC: 33.4 g/dL (ref 30.0–36.0)
MCV: 88.6 fL (ref 80.0–100.0)
Platelets: 171 10*3/uL (ref 150–400)
RBC: 5.07 MIL/uL (ref 4.22–5.81)
RDW: 12.4 % (ref 11.5–15.5)
WBC: 7 10*3/uL (ref 4.0–10.5)
nRBC: 0 % (ref 0.0–0.2)

## 2022-05-20 LAB — APTT: aPTT: 29 seconds (ref 24–36)

## 2022-05-20 LAB — COMPREHENSIVE METABOLIC PANEL
ALT: 18 U/L (ref 0–44)
AST: 17 U/L (ref 15–41)
Albumin: 4.6 g/dL (ref 3.5–5.0)
Alkaline Phosphatase: 62 U/L (ref 38–126)
Anion gap: 11 (ref 5–15)
BUN: 10 mg/dL (ref 8–23)
CO2: 29 mmol/L (ref 22–32)
Calcium: 9.3 mg/dL (ref 8.9–10.3)
Chloride: 99 mmol/L (ref 98–111)
Creatinine, Ser: 1.32 mg/dL — ABNORMAL HIGH (ref 0.61–1.24)
GFR, Estimated: 56 mL/min — ABNORMAL LOW (ref >60)
Glucose, Bld: 214 mg/dL — ABNORMAL HIGH (ref 70–99)
Potassium: 3.3 mmol/L — ABNORMAL LOW (ref 3.5–5.1)
Sodium: 139 mmol/L (ref 135–145)
Total Bilirubin: 0.6 mg/dL (ref 0.3–1.2)
Total Protein: 7.5 g/dL (ref 6.5–8.1)

## 2022-05-20 LAB — DIFFERENTIAL
Abs Immature Granulocytes: 0.04 10*3/uL (ref 0.00–0.07)
Basophils Absolute: 0 10*3/uL (ref 0.0–0.1)
Basophils Relative: 1 %
Eosinophils Absolute: 0.1 10*3/uL (ref 0.0–0.5)
Eosinophils Relative: 1 %
Immature Granulocytes: 1 %
Lymphocytes Relative: 30 %
Lymphs Abs: 2.1 10*3/uL (ref 0.7–4.0)
Monocytes Absolute: 0.5 10*3/uL (ref 0.1–1.0)
Monocytes Relative: 8 %
Neutro Abs: 4.2 10*3/uL (ref 1.7–7.7)
Neutrophils Relative %: 59 %

## 2022-05-20 LAB — PROTIME-INR
INR: 1.1 (ref 0.8–1.2)
Prothrombin Time: 14.4 seconds (ref 11.4–15.2)

## 2022-05-20 LAB — CBG MONITORING, ED: Glucose-Capillary: 197 mg/dL — ABNORMAL HIGH (ref 70–99)

## 2022-05-20 MED ORDER — SODIUM CHLORIDE 0.9% FLUSH
3.0000 mL | Freq: Once | INTRAVENOUS | Status: AC
  Administered 2022-05-20: 3 mL via INTRAVENOUS

## 2022-05-20 NOTE — ED Triage Notes (Signed)
Pt presents with left sided weakness and abnormal gait for 9 days. Just returned from trip to [place]. Pt reports deficits have not changed since it began.

## 2022-05-20 NOTE — ED Provider Notes (Signed)
[facility] DEPT  Provider Note  CSN: [id number] Arrival date & time: 05/20/22 3734  History Chief Complaint  Patient presents with   Weakness    Grant [name] is a 76 y.o. male with history of DM, CAD, HLD reports while he was out of town on vacation last week he began having L sided numbness (from face, to arm and leg) and weakness on the left side. He did not seek medical attention until he arrived back in [place] today. He denies any falls or injuries. He gets a majority of his medical care at the [facility].    Home Medications Prior to Admission medications   Medication Sig Start Date End Date Taking? Authorizing Provider  amiodarone (PACERONE) 200 MG tablet Take 200 mg by mouth 2 (two) times daily.     [provider]  amitriptyline (ELAVIL) 25 MG tablet TAKE 1 TABLET BY MOUTH EVERYDAY AT BEDTIME FOR 5 NIGHTS THEN TAKE 2 TABLETS DAILY FOR 5 NIGHTS THEN 3 TABLETS EVERY NIGHT 05/24/18   [provider]  apixaban (ELIQUIS) 5 MG TABS tablet Take 5 mg by mouth 2 (two) times daily.    [provider]  aspirin EC 81 MG tablet Take 81 mg by mouth daily.    [provider]  atorvastatin (LIPITOR) 40 MG tablet Take 40 mg by mouth at bedtime.     [provider]  blood glucose meter kit and supplies KIT Dispense based on patient and insurance preference. Use up to four times daily as directed. (FOR ICD-9 250.00, 250.01). 03/10/17   Georgette Shell, MD  Cholecalciferol (VITAMIN D3) 2000 units TABS Take 2,000 Units by mouth daily.    [provider]  cyclobenzaprine (FLEXERIL) 10 MG tablet Take 10 mg by mouth at bedtime as needed for muscle spasms.    [provider]  diclofenac Sodium (VOLTAREN) 1 % GEL Apply 4 g topically 4 (four) times daily. For foot pain 09/04/20   Landis Martins, DPM  escitalopram (LEXAPRO) 10 MG tablet Take 10 mg by mouth at bedtime.    Windy Fast, MD  fluticasone [facility]) 50  MCG/ACT nasal spray Place 2 sprays into both nostrils daily. 06/05/15   Hoyt Koch, MD  gabapentin (NEURONTIN) 300 MG capsule TAKE 1 CAPSULE BY MOUTH EVERY DAY AT BEDTIME FOR 5 DAYS THEN TAKE 1 CAPSULE TWICE DAILY FOR 5 DAYS THEN TAKE 1 CAPSULE THREE TIMES DAILY 04/26/18   [provider]  HYDROcodone-acetaminophen (NORCO/VICODIN) 5-325 MG tablet Take 1 tablet by mouth 3 (three) times daily as needed for moderate pain.     Windy Fast, MD  insulin aspart (NOVOLOG) 100 UNIT/ML injection Inject 0-15 Units into the skin 3 (three) times daily with meals. 03/10/17   Georgette Shell, MD  insulin glargine (LANTUS) 100 UNIT/ML injection Inject 32 Units into the skin daily.    [provider]  Insulin Syringe-Needle U-100 (INSULIN SYRINGE .5CC/31GX5/16") 31G X 5/16" 0.5 ML MISC 16 Units by Does not apply route at bedtime. 03/10/17   Georgette Shell, MD  ketorolac (TORADOL) 10 MG tablet Take 1 tablet (10 mg total) by mouth every 8 (eight) hours as needed. 03/08/18   Deboraha Sprang, MD  levothyroxine (SYNTHROID, LEVOTHROID) 50 MCG tablet Take 50 mcg by mouth daily before breakfast.    [provider]  Semaglutide, 1 MG/DOSE, (OZEMPIC, 1 MG/DOSE,) 2 MG/1.5ML SOPN Inject 1 mg into the skin once a week.    [provider]  terazosin (HYTRIN) 5 MG capsule Take 1 capsule (5 mg total) by mouth 2 (two) times daily. 04/18/15   Hoyt Koch, MD     Allergies    Food   Review of Systems   Review of Systems Please see HPI for pertinent positives and negatives  Physical Exam BP (!) 160/98 (BP Location: Right Arm)   Pulse 71   Temp 98.8 F (37.1 C) (Oral)   Resp 18   Ht _0  (1.88 m)   Wt 99.8 kg   SpO2 97%   BMI 28.25 kg/m   Physical Exam Vitals and nursing note reviewed.  Constitutional:      Appearance: Normal appearance.  HENT:     Head: Normocephalic and atraumatic.     Nose: Nose normal.     Mouth/Throat:     Mouth: Mucous  membranes are moist.  Eyes:     Extraocular Movements: Extraocular movements intact.     Conjunctiva/sclera: Conjunctivae normal.  Cardiovascular:     Rate and Rhythm: Normal rate.  Pulmonary:     Effort: Pulmonary effort is normal.     Breath sounds: Normal breath sounds.  Abdominal:     General: Abdomen is flat.     Palpations: Abdomen is soft.     Tenderness: There is no abdominal tenderness.  Musculoskeletal:        General: No swelling. Normal range of motion.     Cervical back: Neck supple.  Skin:    General: Skin is warm and dry.  Neurological:     General: No focal deficit present.     Mental Status: He is alert.     Sensory: Sensory deficit (diminished sensation to light touch of L face, arm and leg) present.     Motor: Weakness (L arm/leg are 4/5 strength proximal and distal; R side if 5/5 throughout) present.  Psychiatric:        Mood and Affect: Mood normal.     ED Results / Procedures / Treatments   EKG None  Procedures Procedures  Medications Ordered in the ED Medications  sodium chloride flush (NS) 0.9 % injection 3 mL (3 mLs Intravenous Given 05/20/22 2354)    Initial Impression and Plan  Patient here with stroke symptoms ongoing for over a week now. He primarily goes to the [place] for his medical care, no prior history of stroke. He had labs done in triage showing normal CBC, CMP with CKD at baseline, mild hyperglycemia with normal gap. Coags normal. I personally viewed the images from radiology studies and agree with radiologist interpretation: CT neg for acute process, old small vessel infarcts are present. I discussed admission for further evaluation with the patient and wife at bedside and they are amenable. Hospitalist paged.   ED Course   Clinical Course as of 05/21/22 0631  Thu May 20, 2022  2342 EKG not in MUSE shows NSR, rate 73, Flattened T-waves, no ischemic changes or dysrhythmias [CS]  2346 Spoke with Dr. Velia Meyer, Hospitalist, who will accept  for admission. Patient is outside the window for thrombolytics or intervention.  [CS]    Clinical Course User Index [CS] Truddie Hidden, MD     MDM Rules/Calculators/A&P Medical Decision Making Problems Addressed: Cerebrovascular accident (CVA), unspecified mechanism [facility]): acute illness or injury  Amount and/or Complexity of Data Reviewed Labs: ordered. Decision-making details documented in ED Course. Radiology: ordered and independent interpretation performed. Decision-making details documented in ED Course. ECG/medicine tests: independent interpretation performed. Decision-making details documented in  ED Course.  Risk Decision regarding hospitalization.    Final Clinical Impression(s) / ED Diagnoses Final diagnoses:  Cerebrovascular accident (CVA), unspecified mechanism [facility])    Rx / DC Orders ED Discharge Orders     None        Truddie Hidden, MD 05/21/22 [phone]

## 2022-05-21 ENCOUNTER — Inpatient Hospital Stay (HOSPITAL_COMMUNITY): Payer: No Typology Code available for payment source

## 2022-05-21 ENCOUNTER — Encounter (HOSPITAL_COMMUNITY): Payer: Self-pay | Admitting: Internal Medicine

## 2022-05-21 ENCOUNTER — Other Ambulatory Visit: Payer: Self-pay

## 2022-05-21 ENCOUNTER — Observation Stay (HOSPITAL_COMMUNITY): Payer: No Typology Code available for payment source

## 2022-05-21 DIAGNOSIS — I482 Chronic atrial fibrillation, unspecified: Secondary | ICD-10-CM | POA: Diagnosis present

## 2022-05-21 DIAGNOSIS — R531 Weakness: Secondary | ICD-10-CM

## 2022-05-21 DIAGNOSIS — Z7901 Long term (current) use of anticoagulants: Secondary | ICD-10-CM | POA: Diagnosis not present

## 2022-05-21 DIAGNOSIS — I480 Paroxysmal atrial fibrillation: Secondary | ICD-10-CM | POA: Diagnosis present

## 2022-05-21 DIAGNOSIS — I639 Cerebral infarction, unspecified: Secondary | ICD-10-CM | POA: Diagnosis not present

## 2022-05-21 DIAGNOSIS — I6389 Other cerebral infarction: Secondary | ICD-10-CM

## 2022-05-21 DIAGNOSIS — Z91148 Patient's other noncompliance with medication regimen for other reason: Secondary | ICD-10-CM

## 2022-05-21 LAB — ECHOCARDIOGRAM COMPLETE
Area-P 1/2: 5.31 cm2
Height: 74 in
Weight: 3520 oz

## 2022-05-21 LAB — GLUCOSE, CAPILLARY
Glucose-Capillary: 167 mg/dL — ABNORMAL HIGH (ref 70–99)
Glucose-Capillary: 179 mg/dL — ABNORMAL HIGH (ref 70–99)
Glucose-Capillary: 239 mg/dL — ABNORMAL HIGH (ref 70–99)

## 2022-05-21 LAB — ETHANOL: Alcohol, Ethyl (B): 15 mg/dL — ABNORMAL HIGH (ref <10)

## 2022-05-21 MED ORDER — ADULT MULTIVITAMIN W/MINERALS CH
1.0000 | ORAL_TABLET | Freq: Every day | ORAL | Status: DC
  Administered 2022-05-21 – 2022-05-26 (×6): 1 via ORAL
  Filled 2022-05-21 (×6): qty 1

## 2022-05-21 MED ORDER — AMIODARONE HCL 200 MG PO TABS
200.0000 mg | ORAL_TABLET | Freq: Two times a day (BID) | ORAL | Status: DC
  Administered 2022-05-21 (×2): 200 mg via ORAL
  Filled 2022-05-21 (×2): qty 1

## 2022-05-21 MED ORDER — STROKE: EARLY STAGES OF RECOVERY BOOK
Freq: Once | Status: AC
  Filled 2022-05-21: qty 1

## 2022-05-21 MED ORDER — ASPIRIN 81 MG PO TBEC
81.0000 mg | DELAYED_RELEASE_TABLET | Freq: Every day | ORAL | Status: DC
  Administered 2022-05-22 – 2022-05-26 (×5): 81 mg via ORAL
  Filled 2022-05-21 (×5): qty 1

## 2022-05-21 MED ORDER — ACETAMINOPHEN 160 MG/5ML PO SOLN: 650.0000 mg | ORAL | Status: DC | PRN

## 2022-05-21 MED ORDER — AMITRIPTYLINE HCL 25 MG PO TABS
75.0000 mg | ORAL_TABLET | Freq: Every day | ORAL | Status: DC
  Administered 2022-05-21 – 2022-05-25 (×5): 75 mg via ORAL
  Filled 2022-05-21 (×5): qty 3

## 2022-05-21 MED ORDER — FLUTICASONE PROPIONATE 50 MCG/ACT NA SUSP
2.0000 | Freq: Every day | NASAL | Status: DC
  Administered 2022-05-22 – 2022-05-26 (×5): 2 via NASAL
  Filled 2022-05-21: qty 16

## 2022-05-21 MED ORDER — PERFLUTREN LIPID MICROSPHERE
1.0000 mL | INTRAVENOUS | Status: AC | PRN
  Administered 2022-05-21: 4 mL via INTRAVENOUS

## 2022-05-21 MED ORDER — ATORVASTATIN CALCIUM 40 MG PO TABS
40.0000 mg | ORAL_TABLET | Freq: Every day | ORAL | Status: DC
  Administered 2022-05-21 – 2022-05-25 (×5): 40 mg via ORAL
  Filled 2022-05-21 (×5): qty 1

## 2022-05-21 MED ORDER — LEVOTHYROXINE SODIUM 50 MCG PO TABS
50.0000 ug | ORAL_TABLET | Freq: Every day | ORAL | Status: DC
  Administered 2022-05-22 – 2022-05-26 (×5): 50 ug via ORAL
  Filled 2022-05-21 (×5): qty 1

## 2022-05-21 MED ORDER — IOHEXOL 350 MG/ML SOLN
75.0000 mL | Freq: Once | INTRAVENOUS | Status: AC | PRN
  Administered 2022-05-21: 75 mL via INTRAVENOUS

## 2022-05-21 MED ORDER — ACETAMINOPHEN 325 MG PO TABS
650.0000 mg | ORAL_TABLET | ORAL | Status: DC | PRN
  Administered 2022-05-23: 650 mg via ORAL
  Filled 2022-05-21: qty 2

## 2022-05-21 MED ORDER — ASPIRIN 300 MG RE SUPP: 300.0000 mg | Freq: Every day | RECTAL | Status: DC

## 2022-05-21 MED ORDER — ENOXAPARIN SODIUM 40 MG/0.4ML IJ SOSY
40.0000 mg | PREFILLED_SYRINGE | INTRAMUSCULAR | Status: DC
  Administered 2022-05-21: 40 mg via SUBCUTANEOUS
  Filled 2022-05-21: qty 0.4

## 2022-05-21 MED ORDER — INSULIN ASPART 100 UNIT/ML IJ SOLN
0.0000 [IU] | Freq: Three times a day (TID) | INTRAMUSCULAR | Status: DC
  Administered 2022-05-21 (×2): 3 [IU] via SUBCUTANEOUS
  Administered 2022-05-22: 5 [IU] via SUBCUTANEOUS
  Administered 2022-05-22 – 2022-05-23 (×3): 3 [IU] via SUBCUTANEOUS
  Administered 2022-05-23 (×2): 5 [IU] via SUBCUTANEOUS
  Administered 2022-05-24 (×2): 3 [IU] via SUBCUTANEOUS
  Administered 2022-05-24: 8 [IU] via SUBCUTANEOUS
  Administered 2022-05-25 (×2): 3 [IU] via SUBCUTANEOUS
  Administered 2022-05-25: 2 [IU] via SUBCUTANEOUS
  Administered 2022-05-26: 3 [IU] via SUBCUTANEOUS
  Administered 2022-05-26: 5 [IU] via SUBCUTANEOUS
  Administered 2022-05-26: 3 [IU] via SUBCUTANEOUS

## 2022-05-21 MED ORDER — CYCLOBENZAPRINE HCL 10 MG PO TABS: 10.0000 mg | ORAL_TABLET | Freq: Every evening | ORAL | Status: DC | PRN

## 2022-05-21 MED ORDER — APIXABAN 5 MG PO TABS
5.0000 mg | ORAL_TABLET | Freq: Two times a day (BID) | ORAL | Status: DC
  Administered 2022-05-21 – 2022-05-26 (×10): 5 mg via ORAL
  Filled 2022-05-21 (×10): qty 1

## 2022-05-21 MED ORDER — GABAPENTIN 300 MG PO CAPS
300.0000 mg | ORAL_CAPSULE | Freq: Three times a day (TID) | ORAL | Status: DC
  Administered 2022-05-21 – 2022-05-26 (×15): 300 mg via ORAL
  Filled 2022-05-21 (×15): qty 1

## 2022-05-21 MED ORDER — TERAZOSIN HCL 5 MG PO CAPS
5.0000 mg | ORAL_CAPSULE | Freq: Two times a day (BID) | ORAL | Status: DC
  Administered 2022-05-21 – 2022-05-26 (×10): 5 mg via ORAL
  Filled 2022-05-21 (×12): qty 1

## 2022-05-21 MED ORDER — ASPIRIN 325 MG PO TABS
325.0000 mg | ORAL_TABLET | Freq: Every day | ORAL | Status: DC
  Administered 2022-05-21: 325 mg via ORAL
  Filled 2022-05-21: qty 1

## 2022-05-21 MED ORDER — INSULIN GLARGINE-YFGN 100 UNIT/ML ~~LOC~~ SOLN
32.0000 [IU] | Freq: Every day | SUBCUTANEOUS | Status: DC
  Administered 2022-05-21 – 2022-05-25 (×5): 32 [IU] via SUBCUTANEOUS
  Filled 2022-05-21 (×6): qty 0.32

## 2022-05-21 MED ORDER — ESCITALOPRAM OXALATE 10 MG PO TABS
10.0000 mg | ORAL_TABLET | Freq: Every day | ORAL | Status: DC
  Administered 2022-05-21 – 2022-05-25 (×5): 10 mg via ORAL
  Filled 2022-05-21 (×5): qty 1

## 2022-05-21 MED ORDER — SODIUM CHLORIDE 0.9 % IV SOLN: INTRAVENOUS | Status: DC

## 2022-05-21 MED ORDER — ACETAMINOPHEN 650 MG RE SUPP: 650.0000 mg | RECTAL | Status: DC | PRN

## 2022-05-21 MED ORDER — SENNOSIDES-DOCUSATE SODIUM 8.6-50 MG PO TABS
1.0000 | ORAL_TABLET | Freq: Every evening | ORAL | Status: DC | PRN
  Administered 2022-05-22 – 2022-05-23 (×2): 1 via ORAL
  Filled 2022-05-21 (×2): qty 1

## 2022-05-21 NOTE — Progress Notes (Signed)
Brief Nutrition Note  Consult received for patient with new dx of CVA. Met with pt at bedside. Pt reports a good appetite prior to admission and that his weight has been stable. Reports that several years ago he weighed 265 lb and intentionally lost down to 230 lb which is his normal now.   Discussed home diet. Pt does drink sugar sweetened beverages often (loves sunnyD). Encouraged a balance diet and to consume his meals while admitted to maintain lean muscle mass.  No acute interventions needed at this time. Please place new consult if acute needs arise.   Rachel Hunter, RD, LDN Clinical Dietitian RD pager # available in AMION  After hours/weekend pager # available in AMION 

## 2022-05-21 NOTE — Progress Notes (Signed)
Pt with PT and doctor, will attempt later. 2:37 pm

## 2022-05-21 NOTE — Progress Notes (Signed)
Pt admitted to room 3W24, vital signs stable. No reports of pain. Oriented to unit and fall protocol, educated on fall risks and how to call for assist. Admitting notified of arrival and stated that admitting orders will be addressed by dayshift admitting MD. Pt's wife made aware of arrival and given contact info.

## 2022-05-21 NOTE — ED Notes (Signed)
Pt walks with unsteady gate and requires at least one person assist when getting up to use the bathroom.

## 2022-05-21 NOTE — ED Notes (Signed)
[name], his wife left to go home.  Requested that we call in AM if he is transferred.   (H) [phone] (C) [phone]  Pt encouraged to call if anything is needed.  Educated on the dangers of trying to get out of bed by himself.  Pt verbalized understanding.

## 2022-05-21 NOTE — Progress Notes (Signed)
  Echocardiogram 2D Echocardiogram has been performed.  Grant [name] 05/21/2022, 5:04 PM

## 2022-05-21 NOTE — H&P (Signed)
History and Physical    Patient: Grant [name] QMG:[id number] DOB: 05-03-1946 DOA: 05/20/2022 DOS: the patient was seen and examined on 05/21/2022 PCP: [facility]  Patient coming from: Home - lives with wife; Donald Prose:  Wife, [phone]   Chief Complaint: stroke-like symptoms  HPI: Breck Junior Kinley is a 76 y.o. male with medical history significant of chronic systolic CHF with AICD; afib on Eliquis; BPH; CAD; DM; HTN; HLD; hypothyroidism; and OSA presenting with L-sided weakness and gait disturbance x 9 days.   He and his wife went to [place] for Thanksgiving.  His left shoulder was hurting a lot on arrival with throbbing pains.  He noticed numbness along the entire left side to his foot starting at his face.  Each day it got worse and worse.  He didn't want to go to the hospital there, wanted to go here instead.  He thought about coming back early but decided not to.  There was some weakness in his left arm and leg.  He fell a couple of times.  No dysphagia or dysarthria.      ER Course:  [facility] transfer, per Dr. Velia Meyer:  New onset left hemiparesis and left sided numbness that started suddenly 8 days ago, with interval persistence of these symptoms.  He was out of town at the time of onset of these symptoms and therefore has not sought medical attention until today, when he returned back to the [place] area.  CT head showed no acute process.      Review of Systems: As mentioned in the history of present illness. All other systems reviewed and are negative. Past Medical History:  Diagnosis Date   AICD (automatic cardioverter/defibrillator) present    BPH (benign prostatic hyperplasia)    CAD (coronary artery disease)    DM (diabetes mellitus) ([place])    HLD (hyperlipidemia)    Hyperglycemia 03/09/2017   Hypothyroidism    NSVT (nonsustained ventricular tachycardia) ([place])    Obstructive sleep apnea    Past Surgical History:  Procedure Laterality  Date   ABDOMINAL SURGERY  1981   repair lungs, liver, and intenstines from trauma   Social History:  reports that he has been smoking cigarettes. He has a 32.00 pack-year smoking history. His smokeless tobacco use includes chew. He reports current alcohol use. He reports that he does not currently use drugs after having used the following drugs: Marijuana.  Allergies  Allergen Reactions   Food Anaphylaxis    LAMB OR GOAT   Lisinopril     Possible cough    Family History  Problem Relation Age of Onset   CAD Mother    Hypertension Mother    Cancer - Colon Father    Cancer - Colon Brother     Prior to Admission medications   Medication Sig Start Date End Date Taking? Authorizing Provider  amiodarone (PACERONE) 200 MG tablet Take 200 mg by mouth 2 (two) times daily.     [provider]  amitriptyline (ELAVIL) 25 MG tablet TAKE 1 TABLET BY MOUTH EVERYDAY AT BEDTIME FOR 5 NIGHTS THEN TAKE 2 TABLETS DAILY FOR 5 NIGHTS THEN 3 TABLETS EVERY NIGHT 05/24/18   [provider]  apixaban (ELIQUIS) 5 MG TABS tablet Take 5 mg by mouth 2 (two) times daily.    [provider]  aspirin EC 81 MG tablet Take 81 mg by mouth daily.    [provider]  atorvastatin (LIPITOR) 40 MG tablet Take 40 mg by mouth  at bedtime.     [provider]  blood glucose meter kit and supplies KIT Dispense based on patient and insurance preference. Use up to four times daily as directed. (FOR ICD-9 250.00, 250.01). 03/10/17   Georgette Shell, MD  Cholecalciferol (VITAMIN D3) 2000 units TABS Take 2,000 Units by mouth daily.    [provider]  cyclobenzaprine (FLEXERIL) 10 MG tablet Take 10 mg by mouth at bedtime as needed for muscle spasms.    [provider]  diclofenac Sodium (VOLTAREN) 1 % GEL Apply 4 g topically 4 (four) times daily. For foot pain 09/04/20   Landis Martins, DPM  escitalopram (LEXAPRO) 10 MG tablet Take 10 mg by mouth at bedtime.    Windy Fast, MD  fluticasone [facility]) 50 MCG/ACT nasal spray Place 2 sprays into both nostrils daily. 06/05/15   Hoyt Koch, MD  gabapentin (NEURONTIN) 300 MG capsule TAKE 1 CAPSULE BY MOUTH EVERY DAY AT BEDTIME FOR 5 DAYS THEN TAKE 1 CAPSULE TWICE DAILY FOR 5 DAYS THEN TAKE 1 CAPSULE THREE TIMES DAILY 04/26/18   [provider]  HYDROcodone-acetaminophen (NORCO/VICODIN) 5-325 MG tablet Take 1 tablet by mouth 3 (three) times daily as needed for moderate pain.     Windy Fast, MD  insulin aspart (NOVOLOG) 100 UNIT/ML injection Inject 0-15 Units into the skin 3 (three) times daily with meals. 03/10/17   Georgette Shell, MD  insulin glargine (LANTUS) 100 UNIT/ML injection Inject 32 Units into the skin daily.    [provider]  Insulin Syringe-Needle U-100 (INSULIN SYRINGE .5CC/31GX5/16") 31G X 5/16" 0.5 ML MISC 16 Units by Does not apply route at bedtime. 03/10/17   Georgette Shell, MD  ketorolac (TORADOL) 10 MG tablet Take 1 tablet (10 mg total) by mouth every 8 (eight) hours as needed. 03/08/18   Deboraha Sprang, MD  levothyroxine (SYNTHROID, LEVOTHROID) 50 MCG tablet Take 50 mcg by mouth daily before breakfast.    [provider]  Semaglutide, 1 MG/DOSE, (OZEMPIC, 1 MG/DOSE,) 2 MG/1.5ML SOPN Inject 1 mg into the skin once a week.    [provider]  terazosin (HYTRIN) 5 MG capsule Take 1 capsule (5 mg total) by mouth 2 (two) times daily. 04/18/15   Hoyt Koch, MD    Physical Exam: Vitals:   05/21/22 0448 05/21/22 0523 05/21/22 0526 05/21/22 1201  BP:  (!) 160/98  (!) 158/92  Pulse:  71  75  Resp: _0 Temp: 98.5 F (36.9 C)  98.8 F (37.1 C) 98 F (36.7 C)  TempSrc: Oral  Oral Oral  SpO2: 95% 97%  99%  Weight:   99.8 kg   Height:   _1  (1.88 m)    General:  Appears calm and comfortable and is in NAD Eyes:  EOMI, normal lids, iris ENT:  grossly normal hearing, lips & tongue, mmm Neck:  no LAD, masses or  thyromegaly Cardiovascular:  RRR, no m/r/g. No LE edema.  Respiratory:   CTA bilaterally with no wheezes/rales/rhonchi.  Normal respiratory effort. Abdomen:  soft, NT, ND Skin:  no rash or induration seen on limited exam Musculoskeletal:  LUE/LLE weakness, 3-4/5 Psychiatric:  blunted mood and affect, speech fluent and appropriate, AOx3 Neurologic:  CN 2-12 grossly intact, moves all extremities in coordinated fashion but with diminished strength on left, sensation diminished on entire L face and body   Radiological Exams on Admission: Independently reviewed - see discussion in A/P where applicable  CT  HEAD WO CONTRAST  Result Date: 05/20/2022 CLINICAL DATA:  Left-sided weakness EXAM: CT HEAD WITHOUT CONTRAST TECHNIQUE: Contiguous axial images were obtained from the base of the skull through the vertex without intravenous contrast. RADIATION DOSE REDUCTION: This exam was performed according to the departmental dose-optimization program which includes automated exposure control, adjustment of the mA and/or kV according to patient size and/or use of iterative reconstruction technique. COMPARISON:  None Available. FINDINGS: Brain: There is no mass, hemorrhage or extra-axial collection. There is generalized atrophy without lobar predilection. Hypodensity of the white matter is most commonly associated with chronic microvascular disease. 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 Vascular: Atherosclerotic calcification of the vertebral and internal carotid arteries at the skull base. No abnormal hyperdensity of the major intracranial arteries or dural venous sinuses. Skull: The visualized skull base, calvarium and extracranial soft tissues are normal. Sinuses/Orbits: No fluid levels or advanced mucosal thickening of the visualized paranasal sinuses. No mastoid or middle ear effusion. The orbits are normal. IMPRESSION: 1. No acute intracranial abnormality. 2. Generalized atrophy and findings  of chronic microvascular disease. 3. 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 Electronically Signed   By: Ulyses Jarred M.D.   On: 05/20/2022 20:12    EKG: Independently reviewed.   1942 - NSR with rate 73; prolonged QTc 508; nonspecific ST changes with no evidence of acute ischemia 0004 - NSR with rate 70; prolonged QTc 638; nonspecific ST changes with no evidence of acute ischemia   Labs on Admission: I have personally reviewed the available labs and imaging studies at the time of the admission.  Pertinent labs:    K+ 3.3 Glucose 214 BN 10/Creatinine 1.32/GFR 56 Normal CBC INR 1.1 ETOH 15   Assessment and Plan: Principal Problem:   Acute ischemic stroke [facility]) Active Problems:   Essential hypertension   Tobacco abuse   Hypothyroidism   CAD (coronary artery disease)   ICD (implantable cardioverter-defibrillator) in place   Diabetes mellitus with complication (HCC)   PAF (paroxysmal atrial fibrillation) ([place])    CVA -Patient with acute onset of left-sided weakness and numbness about 8-9 days ago, but did not seek care since he was out of town for the holidays -Concerning for CVA -Aspirin has been given to reduce stroke mortality and decrease morbidity -Will place in observation status for CVA evaluation -Telemetry monitoring -MRI unavailable given h/o AICD; will order CTA -Echo -Risk stratification with FLP, A1c -Neurology consult -PT/OT/ST/Nutrition Consults  Afib -Continue amiodarone for rate control -Continue Eliquis  HTN -Does not need permissive HTN given the duration of his symptoms -However, he does not appear to be on home medications   HLD -Check FLP -Resume statin, Lipitor 40 mg daily   DM -Recent A1c shows reasonably good control -Continue glargine -Will order moderate-scale SSI  Chronic systolic CHF/CAD -Has AICD which is NOT MRI compatible -Appears compensated -Continue ASA  Hypothyroidism -Continue Synthroid at current  dose for now   OSA -Continue CPAP  Mood d/o -Continue amitriptyline, Lexapro, gabapentin  Obesity -Hold Ozempic -Appears to be generally well controlled at this time  Tobacco dependence -Encourage cessation.   -This was discussed with the patient and should be reviewed on an ongoing basis.   -Patch ordered     Advance Care Planning:   Code Status: Full Code   Consults: Neurology; PT/OT/ST; TOC team; nutrition  DVT Prophylaxis: Eliquis  Family Communication: None present; he declined to have me call family at the time of  the evaluation  Severity of Illness: The appropriate patient status for this patient is OBSERVATION. Observation status is judged to be reasonable and necessary in order to provide the required intensity of service to ensure the patient's safety. The patient's presenting symptoms, physical exam findings, and initial radiographic and laboratory data in the context of their medical condition is felt to place them at decreased risk for further clinical deterioration. Furthermore, it is anticipated that the patient will be medically stable for discharge from the hospital within 2 midnights of admission.   Author: Karmen Bongo, MD 05/21/2022 3:38 PM  For on call review [email or web address].

## 2022-05-21 NOTE — Evaluation (Addendum)
Physical Therapy Evaluation Patient Details Name: Grant [name] MRN: [id number] DOB: Nov 17, 1945 Today's Date: 05/21/2022  History of Present Illness  Pt is 76 yo male admitted on 11/14/21 with L sided weakness for 9 days but was out of town so waited to come to hospital until returned home.  CT was negative for acute stroke and pt unable to have MRI.  Pt with hx of afib, dm, CAD, HLD.  Clinical Impression  Pt admitted with above diagnosis. At baseline, pt is independent and ambulatory with cane.  He now presents with decreased sensation R face and L UE/LE; decreased coordination L UE and LE; decreased mobility; decreased balance; decreased strength L UE/LE.  Additionally, pt reports dizziness and with uncoordinated/saccadic like eye movements with smooth pursuit and potential L visual field deficit.  He was able to transfer with min guard and ambulated 100' with RW and min guard.  Pt has home support.  Pt currently with functional limitations due to the deficits listed below (see PT Problem List). Pt will benefit from skilled PT to increase their independence and safety with mobility to allow discharge to the venue listed below.          Recommendations for follow up therapy are one component of a multi-disciplinary discharge planning process, led by the attending physician.  Recommendations may be updated based on patient status, additional functional criteria and insurance authorization.  Follow Up Recommendations Home health PT      Assistance Recommended at Discharge Intermittent Supervision/Assistance  Patient can return home with the following  A little help with walking and/or transfers;A little help with bathing/dressing/bathroom;Assistance with cooking/housework;Help with stairs or ramp for entrance    Equipment Recommendations Rolling walker (2 wheels)  Recommendations for Other Services       Functional Status Assessment Patient has had a recent decline in their functional  status and demonstrates the ability to make significant improvements in function in a reasonable and predictable amount of time.     Precautions / Restrictions Precautions Precautions: Fall      Mobility  Bed Mobility Overal bed mobility: Needs Assistance Bed Mobility: Supine to Sit, Sit to Supine     Supine to sit: Supervision Sit to supine: Supervision        Transfers Overall transfer level: Needs assistance Equipment used: Rolling walker (2 wheels) Transfers: Sit to/from Stand Sit to Stand: Min guard           General transfer comment: min guard and cues for RW    Ambulation/Gait Ambulation/Gait assistance: Min guard Gait Distance (Feet): 100 Feet Assistive device: Rolling walker (2 wheels) Gait Pattern/deviations: Step-through pattern, Decreased stride length, Decreased stance time - left Gait velocity: decreased     General Gait Details: Mild decrease in stance time on L; cues for RW use; decreased speed; reports some dizziness but when returned to room described as lightheaded  Stairs            Wheelchair Mobility    Modified Rankin (Stroke Patients Only) Modified Rankin (Stroke Patients Only) Pre-Morbid Rankin Score: No significant disability Modified Rankin: Moderate disability     Balance Overall balance assessment: Needs assistance Sitting-balance support: No upper extremity supported Sitting balance-Leahy Scale: Good     Standing balance support: Single extremity supported, Bilateral upper extremity supported Standing balance-Leahy Scale: Poor Standing balance comment: RW to ambulate but could static stand single UE support  Pertinent Vitals/Pain Pain Assessment Pain Assessment: 0-10 Pain Score: 5  Pain Location: L shoulder Pain Descriptors / Indicators: Discomfort Pain Intervention(s): Limited activity within patient's tolerance, Monitored during session    Home Living Family/patient  expects to be discharged to:: Private residence Living Arrangements: Spouse/significant other Available Help at Discharge: Family;Available 24 hours/day Type of Home: House Home Access: Stairs to enter Entrance Stairs-Rails: Right Entrance Stairs-Number of Steps: 2   Home Layout: One level Home Equipment: Grab bars - tub/shower;Cane - single point      Prior Function Prior Level of Function : Independent/Modified Independent             Mobility Comments: Could ambulate in community; uses cane in community and in house either cane or furniture; reports he always wears his cowboy boots ADLs Comments: independent adls; pt and wife share IADLs     Hand Dominance        Extremity/Trunk Assessment   Upper Extremity Assessment Upper Extremity Assessment: LUE deficits/detail LUE Deficits / Details: L UE with weakness, decreased sensation, decreased coordination. Defer to OT for further details.  He is able to use to help don socks, assist with transfers, and hold RW LUE Sensation: decreased light touch LUE Coordination: decreased fine motor;decreased gross motor    Lower Extremity Assessment Lower Extremity Assessment: LLE deficits/detail;RLE deficits/detail RLE Deficits / Details: ROM WFL ; MMT 5/5 LLE Deficits / Details: ROM WFL; MMT: ankle 4/5, knee ext 4/5 and painful in hamstrings, hip 3/5 LLE Sensation: decreased light touch (reports feels rough; was able to identify pinch) LLE Coordination: decreased gross motor;decreased fine motor    Cervical / Trunk Assessment Cervical / Trunk Assessment: Other exceptions  Communication   Communication: No difficulties  Cognition Arousal/Alertness: Awake/alert Behavior During Therapy: WFL for tasks assessed/performed Overall Cognitive Status: Within Functional Limits for tasks assessed                                          General Comments General comments (skin integrity, edema, etc.):   Visual: Pt  reports TV gets blurry lower left quadrant at times with this recent event but at this time demonstrated equal visual fields; pt with uncoordinated tracking/smooth pursuit (worse to left bi nystagmus).    Face numb/decreased on R side  Educated on PT role, POC.  Recommended use of RW at home initially.  Also, educated on slow transitions, focus points, and segmental turns to assist with dizziness.     Exercises     Assessment/Plan    PT Assessment Patient needs continued PT services  PT Problem List Decreased strength;Decreased coordination;Impaired sensation;Decreased activity tolerance;Decreased knowledge of use of DME;Decreased balance;Decreased safety awareness;Decreased mobility;Decreased knowledge of precautions       PT Treatment Interventions DME instruction;Therapeutic exercise;Gait training;Balance training;Stair training;Functional mobility training;Therapeutic activities;Patient/family education;Cognitive remediation;Neuromuscular re-education    PT Goals (Current goals can be found in the Care Plan section)  Acute Rehab PT Goals Patient Stated Goal: return home; ambulate with cane PT Goal Formulation: With patient/family Time For Goal Achievement: 06/04/22 Potential to Achieve Goals: Good    Frequency Min 4X/week     Co-evaluation               AM-PAC PT "6 Clicks" Mobility  Outcome Measure Help needed turning from your back to your side while in a flat bed without using bedrails?: None Help needed moving from lying  on your back to sitting on the side of a flat bed without using bedrails?: A Little Help needed moving to and from a bed to a chair (including a wheelchair)?: A Little Help needed standing up from a chair using your arms (e.g., wheelchair or bedside chair)?: A Little Help needed to walk in hospital room?: A Little Help needed climbing 3-5 steps with a railing? : A Little 6 Click Score: 19    End of Session Equipment Utilized During Treatment:  Gait belt Activity Tolerance: Patient tolerated treatment well Patient left: in bed;with bed alarm set;with family/visitor present Nurse Communication: Mobility status PT Visit Diagnosis: Other abnormalities of gait and mobility (R26.89);Hemiplegia and hemiparesis Hemiplegia - Right/Left: Left Hemiplegia - dominant/non-dominant: Non-dominant Hemiplegia - caused by: Cerebral infarction    Time: 1007-1219 PT Time Calculation (min) (ACUTE ONLY): 42 min   Charges:   PT Evaluation $PT Eval Moderate Complexity: 1 Mod PT Treatments $Gait Training: 8-22 mins $Therapeutic Activity: 8-22 mins        Anise Salvo, PT Acute Rehab [facility] Rehab [phone]   Rayetta Humphrey 05/21/2022, 3:03 PM

## 2022-05-21 NOTE — ED Notes (Signed)
Assumed care of pt, who was found alert and oriented in bed w/ wife sitting bedside.  No complaints at this time.  Modified NIH completed, left sided weakness especially in the left leg.

## 2022-05-21 NOTE — ED Notes (Signed)
Assisted to bathroom, very unsteady gait.  Pt observed struggling buttoning his pants and latching his belt.  "My hands have not worked for about 9 days."  Pt states "it feels like my left hand is in a bucket of ice water."

## 2022-05-21 NOTE — Evaluation (Signed)
Speech Language Pathology Evaluation Patient Details Name: Grant [name] MRN: [id number] DOB: Aug 05, 1945 Today's Date: 05/21/2022 Time: [id number] SLP Time Calculation (min) (ACUTE ONLY): 17 min  Problem List:  Patient Active Problem List   Diagnosis Date Noted   Acute CVA (cerebrovascular accident) ([place]) 05/21/2022   PAF (paroxysmal atrial fibrillation) ([place]) 05/21/2022   Left-sided weakness 05/21/2022   Atrial fibrillation, chronic ([place]) 05/21/2022   Anticoagulated 05/21/2022   H/O medication noncompliance 05/21/2022   Acute ischemic stroke ([place]) 05/20/2022   Hyperglycemia 03/09/2017   Hyperglycemia, unspecified 03/08/2017   ICD (implantable cardioverter-defibrillator) in place 03/08/2017   Acute kidney injury ([place]) 03/08/2017   Abdominal pain    Diabetes mellitus with complication ([place])    Viral URI with cough 09/10/2015   Upper airway cough syndrome 06/05/2015   Hx of hepatitis C 05/06/2015   Muscle cramp 05/06/2015   Diet-controlled diabetes mellitus ([place]) 06/04/2014   Essential hypertension 06/04/2014   Benign prostatic hyperplasia 06/04/2014   Tobacco abuse 06/04/2014   Osteoarthritis 06/04/2014   Hypothyroidism 06/04/2014   CAD (coronary artery disease) 06/04/2014   Past Medical History:  Past Medical History:  Diagnosis Date   AICD (automatic cardioverter/defibrillator) present    BPH (benign prostatic hyperplasia)    CAD (coronary artery disease)    DM (diabetes mellitus) ([place])    HLD (hyperlipidemia)    Hyperglycemia 03/09/2017   Hypothyroidism    NSVT (nonsustained ventricular tachycardia) ([facility])    Obstructive sleep apnea    Past Surgical History:  Past Surgical History:  Procedure Laterality Date   ABDOMINAL SURGERY  1981   repair lungs, liver, and intenstines from trauma   HPI:  Pt is 76 yo male admitted on 11/14/21 with L sided weakness for 9 days but was out of town so waited to come to hospital until returned home.  CT was negative for acute  stroke and pt unable to have MRI.  Pt with hx of afib, dm, CAD, HLD.   Assessment / Plan / Recommendation Clinical Impression  Pt was administered a portion of the SLUMS, with testing not able to be fully completed as MD arrived and transport arrived to take pt to CT. In the portion that was administered, he scored 10 out of a possible 16 points, which would have resulted in a score suggestive of impairment as throughout the whole test, 27-30 is considered to be [facility]. Note that pt was also receiving assistance from family in the form of cueing. Pt demonstrated difficulties with short-term recall, working memory, processing and comprehension of more complex instructions, and selective attention. Speech and language seemed to be relatively intact. Pt and family are not sure that he is very different from his norm, but do acknowledge some changes based on testing today. Will f/u for ongoing SLP services directed at cognition.    SLP Assessment  SLP Recommendation/Assessment: Patient needs continued Speech [place] [facility] SLP Visit Diagnosis: Cognitive communication deficit (R41.841)    Recommendations for follow up therapy are one component of a multi-disciplinary discharge planning process, led by the attending physician.  Recommendations may be updated based on patient status, additional functional criteria and insurance authorization.    Follow Up Recommendations  Home health SLP    Assistance Recommended at Discharge  Frequent or constant Supervision/Assistance  Functional Status Assessment Patient has had a recent decline in their functional status and demonstrates the ability to make significant improvements in function in a reasonable and predictable amount of time.  Frequency and Duration min  2x/week  2 weeks      SLP Evaluation Cognition  Overall Cognitive Status: Impaired/Different from baseline Arousal/Alertness: Awake/alert Orientation Level: Oriented X4 Attention:  Selective Selective Attention: Impaired Selective Attention Impairment: Verbal complex Memory: Impaired Memory Impairment: Decreased recall of new information Problem Solving: Appears intact       Comprehension  Auditory Comprehension Overall Auditory Comprehension: Impaired Conversation: Simple Interfering Components: Working [employer] fast streamer;Attention;Processing speed EffectiveTechniques: Extra processing time;Repetition    Expression Expression Primary Mode of Expression: Verbal Verbal Expression Overall Verbal Expression: Appears within functional limits for tasks assessed   Oral / Motor  Motor Speech Overall Motor Speech: Appears within functional limits for tasks assessed            Mahala Menghini., M.A. CCC-SLP Acute Rehabilitation Services Office [phone]  Secure chat preferred  05/21/2022, 3:43 PM

## 2022-05-21 NOTE — Care Management (Addendum)
  Transition of Care [facility]) Screening Note   Patient Details  Name: Grant [name] Date of Birth: May 16, 1946   Transition of Care [facility]) CM/SW Contact:    Gala Lewandowsky, RN Phone Number: 05/21/2022, 11:42 AM    Transition of Care Department [facility]) has reviewed the patient and no TOC needs have been identified at this time. Case Manager did notify the [facility] Fees Coordinator to make them aware of hospitalization-voicemail left. Case Manager will continue to monitor patient advancement through interdisciplinary progression rounds. If new patient transition needs arise, please place a TOC consult.   1326 05-21-22 Case Manager received notification from the [facility]-Patient is a member of the [employer]; patients PCP is Dr. Joseph Art and CSW is Dolphus Jenny at [phone] ext [phone] for transition of care needs.

## 2022-05-21 NOTE — Progress Notes (Signed)
Hospitalist Transfer Note:  Transferring facility: [facility] Requesting provider: Dr. Bernette Mayers (EDP at [facility]) Reason for transfer: admission for further evaluation and management of suspected acute ischemic stroke.    76  year old male with medical history notable for paroxysmal atrial fibrillation chronically anticoagulated on Eliquis, paroxysmal ventricular tachycardia status post ICD placement for secondary prevention, diabetes, hypertension, hyperlipidemia who presented to [facility] ED complaining of new onset left hemiparesis and left sided numbness that started suddenly 8 days ago, with interval persistence of these symptoms.  He was out of town at the time of onset of these symptoms and therefore has not sought medical attention until today, when he returned back to the [place] area.   CT head showed no acute process.   Subsequently, I accepted this patient for transfer for inpatient admission to a med-tele bed at [facility] for further work-up and management of suspected acute ischemic stroke .   Of note, as the patient is well outside of the window for tpa or thrombectomy given reported last known well of greater than one week ago, the case was not yet discussed with neurology.       Check [email or web address] for on-call coverage.   Nursing staff, Please call [facility] Admits & Consults System-Wide number on Amion as soon as patient's arrival, so appropriate admitting provider can evaluate the pt.     Grant Pigg, DO Hospitalist

## 2022-05-21 NOTE — Progress Notes (Signed)
PT Cancellation Note  Patient Details Name: Tirso Laws MRN: [id number] DOB: [birth year]   Cancelled Treatment:    Reason Eval/Treat Not Completed: Patient at procedure or test/unavailable  Patient in restroom with staff- Lab team waiting to draw labs. Will follow-up for comprehensive PT evaluation as time allows.   Kathlyn [place], PT, DPT Physical Therapist Acute Rehabilitation Services [facility] Outpatient Rehabilitation Services [facility]  05/21/2022, 11:49 AM

## 2022-05-21 NOTE — Consult Note (Signed)
Neurology Consultation  Reason for Consult: Stroke Referring Physician: Ophelia Charter  CC: left sided weakness, gait disturbance   History is obtained from: Patient, Chart Review   HPI: Grant [name] is a 76 y.o. male medical history significant of chronic systolic CHF with AICD; afib on Eliquis; BPH; CAD; DM; HTN; HLD; hypothyroidism; and OSA presenting with L-sided weakness and gait disturbance x 9 days.   He and his wife went to [place] for Thanksgiving.  His left shoulder was hurting a lot on arrival with throbbing pains.  He noticed numbness along the entire left side to his foot starting at his face.  Each day it got worse and worse.  He didn't want to go to the hospital there, wanted to go here instead.  He thought about coming back early but decided not to.  There was some weakness in his left arm and leg.  He fell a couple of times.  No dysphagia or dysarthria.     LKW: 11/23 tpa given?: no, outside of the window Premorbid modified Rankin scale (mRS):  0-Completely asymptomatic and back to baseline post-stroke   ROS: Full ROS was performed and is negative except as noted in the HPI.   Past Medical History:  Diagnosis Date   AICD (automatic cardioverter/defibrillator) present    BPH (benign prostatic hyperplasia)    CAD (coronary artery disease)    DM (diabetes mellitus) (HCC)    HLD (hyperlipidemia)    Hyperglycemia 03/09/2017   Hypothyroidism    NSVT (nonsustained ventricular tachycardia) (HCC)    Obstructive sleep apnea     Family History  Problem Relation Age of Onset   CAD Mother    Hypertension Mother    Cancer - Colon Father    Cancer - Colon Brother     Social History:   reports that he has been smoking cigarettes. He has a 32.00 pack-year smoking history. His smokeless tobacco use includes chew. He reports current alcohol use. He reports that he does not currently use drugs after having used the following drugs: Marijuana.  Medications  Current  Facility-Administered Medications:    0.9 %  sodium chloride infusion, , Intravenous, Continuous, Jonah Blue, MD, Last Rate: 50 mL/hr at 05/21/22 1319, Infusion Verify at 05/21/22 1319   acetaminophen (TYLENOL) tablet 650 mg, 650 mg, Oral, Q4H PRN **OR** acetaminophen (TYLENOL) 160 MG/5ML solution 650 mg, 650 mg, Per Tube, Q4H PRN **OR** acetaminophen (TYLENOL) suppository 650 mg, 650 mg, Rectal, Q4H PRN, Jonah Blue, MD   amiodarone (PACERONE) tablet 200 mg, 200 mg, Oral, BID, Jonah Blue, MD, 200 mg at 05/21/22 1210   aspirin suppository 300 mg, 300 mg, Rectal, Daily **OR** aspirin tablet 325 mg, 325 mg, Oral, Daily, Jonah Blue, MD, 325 mg at 05/21/22 1210   atorvastatin (LIPITOR) tablet 40 mg, 40 mg, Oral, QHS, Jonah Blue, MD   enoxaparin (LOVENOX) injection 40 mg, 40 mg, Subcutaneous, Q24H, Jonah Blue, MD, 40 mg at 05/21/22 1210   insulin aspart (novoLOG) injection 0-15 Units, 0-15 Units, Subcutaneous, TID WC, Jonah Blue, MD, 3 Units at 05/21/22 1211   multivitamin with minerals tablet 1 tablet, 1 tablet, Oral, Daily, Jonah Blue, MD   senna-docusate (Senokot-S) tablet 1 tablet, 1 tablet, Oral, QHS PRN, Jonah Blue, MD   Exam: Current vital signs: BP (!) 158/92 (BP Location: Right Arm)   Pulse 75   Temp 98 F (36.7 C) (Oral)   Resp 16   Ht 6\' 2"  (1.88 m)   Wt 99.8 kg   SpO2  99%   BMI 28.25 kg/m  Vital signs in last 24 hours: Temp:  [97.6 F (36.4 C)-98.8 F (37.1 C)] 98 F (36.7 C) (12/01 1201) Pulse Rate:  [66-75] 75 (12/01 1201) Resp:  [16-20] 16 (12/01 1201) BP: (129-160)/(77-107) 158/92 (12/01 1201) SpO2:  [94 %-99 %] 99 % (12/01 1201) Weight:  [99.8 kg] 99.8 kg (12/01 0526)  GENERAL: Awake, alert in NAD HEENT: - Normocephalic and atraumatic, dry mm, no LN++, no Thyromegally LUNGS - Clear to auscultation bilaterally with no wheezes CV - S1S2 RRR, no m/r/g, equal pulses bilaterally. ABDOMEN - Soft, nontender, nondistended with  normoactive BS Ext: warm, well perfused, intact peripheral pulses, no edema  NEURO:  Mental Status: AA&Ox3  Language: speech is clear.  Naming, repetition, fluency, and comprehension intact. Cranial Nerves: PERRL 3 mm/brisk. EOMI, visual fields full, no facial asymmetry, facial sensation diminished, hearing intact, tongue/uvula/soft palate midline, normal sternocleidomastoid and trapezius muscle strength. No evidence of tongue atrophy or fasciculations Motor:  RUE  5/5  LUE  3/5- shoulder strength limited by pain  RLE  5/5   LLE  4/5 Tone: is normal and bulk is normal Sensation- sensation diminished in left face, left upper, and lower extremity. Does split midline of the forhead Coordination: FNF intact with the right, ataxia noted on the left Gait- deferred  NIHSS components Score: Comment  1a Level of Conscious 0[x]  1[]  2[]  3[]         1b LOC Questions 0[x]  1[]  2[]           1c LOC Commands 0[x]  1[]  2[]           2 Best Gaze 0[x]  1[]  2[]           3 Visual 0[x]  1[]  2[]  3[]         4 Facial Palsy 0[x]  1[]  2[]  3[]         5a Motor Arm - left 0[]  1[x]  2[]  3[]  4[]  UN[]     5b Motor Arm - Right 0[x]  1[]  2[]  3[]  4[]  UN[]     6a Motor Leg - Left 0[]  1[x]  2[]  3[]  4[]  UN[]     6b Motor Leg - Right 0[x]  1[]  2[]  3[]  4[]  UN[]     7 Limb Ataxia 0[]  1[x]  2[]  3[]  UN[]       8 Sensory 0[]  1[x]  2[]  UN[]         9 Best Language 0[x]  1[]  2[]  3[]         10 Dysarthria 0[x]  1[]  2[]  UN[]         11 Extinct. and Inattention 0[x]  1[]  2[]           TOTAL: 4        Labs I have reviewed labs in epic and the results pertinent to this consultation are:  CBC    Component Value Date/Time   WBC 7.0 05/20/2022 1934   RBC 5.07 05/20/2022 1934   HGB 15.0 05/20/2022 1934   HGB 13.6 10/19/2016 1126   HCT 44.9 05/20/2022 1934   HCT 40.4 10/19/2016 1126   PLT 171 05/20/2022 1934   PLT 157 10/19/2016 1126   MCV 88.6 05/20/2022 1934   MCV 91 10/19/2016 1126   MCH 29.6 05/20/2022 1934   MCHC 33.4 05/20/2022 1934   RDW  12.4 05/20/2022 1934   RDW 13.4 10/19/2016 1126   LYMPHSABS 2.1 05/20/2022 1934   MONOABS 0.5 05/20/2022 1934   EOSABS 0.1 05/20/2022 1934   BASOSABS 0.0 05/20/2022 1934    CMP     Component Value Date/Time   NA  139 05/20/2022 1934   NA 136 01/19/2017 1323   K 3.3 (L) 05/20/2022 1934   CL 99 05/20/2022 1934   CO2 29 05/20/2022 1934   GLUCOSE 214 (H) 05/20/2022 1934   BUN 10 05/20/2022 1934   BUN 15 01/19/2017 1323   CREATININE 1.32 (H) 05/20/2022 1934   CALCIUM 9.3 05/20/2022 1934   PROT 7.5 05/20/2022 1934   PROT 7.0 10/19/2016 1126   ALBUMIN 4.6 05/20/2022 1934   ALBUMIN 4.6 10/19/2016 1126   AST 17 05/20/2022 1934   ALT 18 05/20/2022 1934   ALKPHOS 62 05/20/2022 1934   BILITOT 0.6 05/20/2022 1934   BILITOT 0.7 10/19/2016 1126   GFRNONAA 56 (L) 05/20/2022 1934   GFRAA 55 (L) 03/10/2017 0221    Lipid Panel     Component Value Date/Time   CHOL 133 07/16/2015 0000   TRIG 227 (A) 07/16/2015 0000   HDL 41 07/16/2015 0000   CHOLHDL 7 06/04/2014 1456   VLDL 48.8 (H) 06/04/2014 1456   LDLCALC 47 07/16/2015 0000   LDLDIRECT 139.6 06/04/2014 1456     Imaging I have reviewed the images obtained:  CT-head- Generalized atrophy and findings of chronic microvascular disease. Multiple old small vessel infarcts of the deep gray nuclei   Assessment: 76  year old male with medical history notable for paroxysmal atrial fibrillation chronically anticoagulated on Eliquis, paroxysmal ventricular tachycardia status post ICD placement for secondary prevention, diabetes, hypertension, hyperlipidemia who presented to [facility] ED complaining of new onset left hemiparesis and left sided numbness that started suddenly 8 days ago, with interval persistence of these symptoms. Awaiting completion of the stroke workup including vessel imaging and echocardiogram. No blood noted on CT, eliquis resumed.   Impression: Stroke determined by clinical assessment,  Recommendations: - HgbA1c,  fasting lipid panel - CTA Head and Neck - Frequent neuro checks - Echocardiogram - Carotid dopplers - Patient cannot get MRI 2/2 AICD - Resume eliquis, no neurologic indication for antiplatelet in addition to therapeutic anticoagulation - Risk factor modification - Telemetry monitoring - PT consult, OT consult, Speech consult - Stroke team to follow   Patient seen and examined by NP/APP with MD. MD to update note as needed.   Janine Ores, DNP, FNP-BC [facility] Pager: [phone]   Attending Neurohospitalist Addendum Patient seen and examined with APP/Resident. Agree with the history and physical as documented above. Agree with the plan as documented, which I helped formulate. I have edited the note above to reflect my full findings and recommendations. I have independently reviewed the chart, obtained history, review of systems and examined the patient.I have personally reviewed pertinent head/neck/spine imaging (CT/MRI). Please feel free to call with any questions.  -- Su Monks, MD [facility] [phone]  If 7pm- 7am, please page neurology on call as listed in [place].

## 2022-05-22 DIAGNOSIS — N189 Chronic kidney disease, unspecified: Secondary | ICD-10-CM | POA: Diagnosis not present

## 2022-05-22 DIAGNOSIS — G8194 Hemiplegia, unspecified affecting left nondominant side: Secondary | ICD-10-CM | POA: Diagnosis present

## 2022-05-22 DIAGNOSIS — E669 Obesity, unspecified: Secondary | ICD-10-CM | POA: Diagnosis present

## 2022-05-22 DIAGNOSIS — Z794 Long term (current) use of insulin: Secondary | ICD-10-CM | POA: Diagnosis not present

## 2022-05-22 DIAGNOSIS — N3000 Acute cystitis without hematuria: Secondary | ICD-10-CM | POA: Diagnosis not present

## 2022-05-22 DIAGNOSIS — I472 Ventricular tachycardia, unspecified: Secondary | ICD-10-CM | POA: Diagnosis present

## 2022-05-22 DIAGNOSIS — F39 Unspecified mood [affective] disorder: Secondary | ICD-10-CM | POA: Diagnosis present

## 2022-05-22 DIAGNOSIS — F17210 Nicotine dependence, cigarettes, uncomplicated: Secondary | ICD-10-CM | POA: Diagnosis present

## 2022-05-22 DIAGNOSIS — R531 Weakness: Secondary | ICD-10-CM | POA: Diagnosis present

## 2022-05-22 DIAGNOSIS — I6501 Occlusion and stenosis of right vertebral artery: Secondary | ICD-10-CM | POA: Diagnosis present

## 2022-05-22 DIAGNOSIS — N390 Urinary tract infection, site not specified: Secondary | ICD-10-CM | POA: Diagnosis not present

## 2022-05-22 DIAGNOSIS — I5022 Chronic systolic (congestive) heart failure: Secondary | ICD-10-CM | POA: Diagnosis present

## 2022-05-22 DIAGNOSIS — I951 Orthostatic hypotension: Secondary | ICD-10-CM | POA: Diagnosis not present

## 2022-05-22 DIAGNOSIS — I110 Hypertensive heart disease with heart failure: Secondary | ICD-10-CM | POA: Diagnosis present

## 2022-05-22 DIAGNOSIS — I4891 Unspecified atrial fibrillation: Secondary | ICD-10-CM | POA: Diagnosis not present

## 2022-05-22 DIAGNOSIS — Z8249 Family history of ischemic heart disease and other diseases of the circulatory system: Secondary | ICD-10-CM | POA: Diagnosis not present

## 2022-05-22 DIAGNOSIS — Z91148 Patient's other noncompliance with medication regimen for other reason: Secondary | ICD-10-CM | POA: Diagnosis not present

## 2022-05-22 DIAGNOSIS — I639 Cerebral infarction, unspecified: Secondary | ICD-10-CM | POA: Diagnosis present

## 2022-05-22 DIAGNOSIS — I480 Paroxysmal atrial fibrillation: Secondary | ICD-10-CM | POA: Diagnosis present

## 2022-05-22 DIAGNOSIS — Z6828 Body mass index (BMI) 28.0-28.9, adult: Secondary | ICD-10-CM | POA: Diagnosis not present

## 2022-05-22 DIAGNOSIS — G4733 Obstructive sleep apnea (adult) (pediatric): Secondary | ICD-10-CM | POA: Diagnosis present

## 2022-05-22 DIAGNOSIS — Z7982 Long term (current) use of aspirin: Secondary | ICD-10-CM | POA: Diagnosis not present

## 2022-05-22 DIAGNOSIS — N401 Enlarged prostate with lower urinary tract symptoms: Secondary | ICD-10-CM | POA: Diagnosis not present

## 2022-05-22 DIAGNOSIS — E1169 Type 2 diabetes mellitus with other specified complication: Secondary | ICD-10-CM | POA: Diagnosis not present

## 2022-05-22 DIAGNOSIS — I63511 Cerebral infarction due to unspecified occlusion or stenosis of right middle cerebral artery: Secondary | ICD-10-CM | POA: Diagnosis not present

## 2022-05-22 DIAGNOSIS — N400 Enlarged prostate without lower urinary tract symptoms: Secondary | ICD-10-CM | POA: Diagnosis present

## 2022-05-22 DIAGNOSIS — E781 Pure hyperglyceridemia: Secondary | ICD-10-CM | POA: Diagnosis present

## 2022-05-22 DIAGNOSIS — Z7901 Long term (current) use of anticoagulants: Secondary | ICD-10-CM | POA: Diagnosis not present

## 2022-05-22 DIAGNOSIS — E1122 Type 2 diabetes mellitus with diabetic chronic kidney disease: Secondary | ICD-10-CM | POA: Diagnosis not present

## 2022-05-22 DIAGNOSIS — Z79899 Other long term (current) drug therapy: Secondary | ICD-10-CM | POA: Diagnosis not present

## 2022-05-22 DIAGNOSIS — M792 Neuralgia and neuritis, unspecified: Secondary | ICD-10-CM | POA: Diagnosis not present

## 2022-05-22 DIAGNOSIS — E1165 Type 2 diabetes mellitus with hyperglycemia: Secondary | ICD-10-CM | POA: Diagnosis present

## 2022-05-22 DIAGNOSIS — E039 Hypothyroidism, unspecified: Secondary | ICD-10-CM | POA: Diagnosis present

## 2022-05-22 DIAGNOSIS — Z95810 Presence of automatic (implantable) cardiac defibrillator: Secondary | ICD-10-CM | POA: Diagnosis not present

## 2022-05-22 DIAGNOSIS — I10 Essential (primary) hypertension: Secondary | ICD-10-CM | POA: Diagnosis not present

## 2022-05-22 DIAGNOSIS — I2510 Atherosclerotic heart disease of native coronary artery without angina pectoris: Secondary | ICD-10-CM | POA: Diagnosis present

## 2022-05-22 LAB — HEMOGLOBIN A1C
Hgb A1c MFr Bld: 9.9 % — ABNORMAL HIGH (ref 4.8–5.6)
Mean Plasma Glucose: 237 mg/dL

## 2022-05-22 LAB — GLUCOSE, CAPILLARY
Glucose-Capillary: 175 mg/dL — ABNORMAL HIGH (ref 70–99)
Glucose-Capillary: 241 mg/dL — ABNORMAL HIGH (ref 70–99)
Glucose-Capillary: 192 mg/dL — ABNORMAL HIGH (ref 70–99)
Glucose-Capillary: 199 mg/dL — ABNORMAL HIGH (ref 70–99)

## 2022-05-22 LAB — LIPID PANEL
Cholesterol: 158 mg/dL (ref 0–200)
HDL: 27 mg/dL — ABNORMAL LOW (ref >40)
LDL Cholesterol: 82 mg/dL (ref 0–99)
Total CHOL/HDL Ratio: 5.9 RATIO
Triglycerides: 247 mg/dL — ABNORMAL HIGH (ref <150)
VLDL: 49 mg/dL — ABNORMAL HIGH (ref 0–40)

## 2022-05-22 MED ORDER — AMIODARONE HCL 200 MG PO TABS
200.0000 mg | ORAL_TABLET | Freq: Every day | ORAL | Status: DC
  Administered 2022-05-22 – 2022-05-26 (×5): 200 mg via ORAL
  Filled 2022-05-22 (×5): qty 1

## 2022-05-22 MED ORDER — SODIUM CHLORIDE 0.9 % IV BOLUS
1000.0000 mL | Freq: Once | INTRAVENOUS | Status: AC
  Administered 2022-05-22: 1000 mL via INTRAVENOUS

## 2022-05-22 NOTE — Evaluation (Signed)
Occupational Therapy Evaluation Patient Details Name: Grant [name] MRN: [id number] DOB: Oct 06, 1945 Today's Date: 05/22/2022   History of Present Illness Pt is 76 yo male admitted on 11/14/21 with L sided weakness for 9 days but was out of town so waited to come to hospital until returned home.  CT was negative for acute stroke and pt unable to have MRI.  Pt reports a fall onto his LT shoulder and hand ~2 months ago with LT wrist and LT shoulder pain since that time. He denies getting any x-rays: "I don't like to go to the doctor." Pt with hx of afib, AICD, dm, CAD, HLD.   Clinical Impression   Patient is currently requiring assistance with ADLs including moderate to maximum assist with Lower body ADLs, minimal assist with Upper body ADLs,  as well as  minimal assist with bed mobility and moderate assist with functional transfers to toilet using RW.  Current level of function is significantly below patient's typical baseline.  During this evaluation, patient was limited by LT sided weakness, impaired activity tolerance, LT shoulder and LT wrist pain which pt reports since sustaining a fall about 2 months ago (MD notified), all of which has the potential to impact patient's safety and independence during functional mobility, as well as performance for ADLs.  Pt also reported falling "just about everyday." Prior to admission.  Patient lives at home, with his wife who is able to provide 24/7 supervision and assistance, however pt's wife present for OT Evaluation and observed pt's difficulty particularly with standing and ambulating and expressed concern that she would be unable to safely care for pt at home with current impairments.  Patient demonstrates good rehab potential, and should benefit from continued skilled occupational therapy services while in acute care to maximize safety, independence and quality of life at home.  Continued occupational therapy services in a AIR setting prior to return  home is recommended.  ?    Recommendations for follow up therapy are one component of a multi-disciplinary discharge planning process, led by the attending physician.  Recommendations may be updated based on patient status, additional functional criteria and insurance authorization.   Follow Up Recommendations  Acute inpatient rehab (3hours/day)     Assistance Recommended at Discharge Frequent or constant Supervision/Assistance  Patient can return home with the following Two people to help with walking and/or transfers;A lot of help with walking and/or transfers;A little help with bathing/dressing/bathroom    Functional Status Assessment  Patient has had a recent decline in their functional status and demonstrates the ability to make significant improvements in function in a reasonable and predictable amount of time.  Equipment Recommendations  BSC/3in1 (2 wheeled RW with 5" wheels.)    Recommendations for Other Services Rehab consult     Precautions / Restrictions Precautions Precautions: Fall Precaution Comments: LT wrist pain with WB (fall 2 months ago on hand. No imaging) Restrictions Weight Bearing Restrictions: No      Mobility Bed Mobility Overal bed mobility: Needs Assistance Bed Mobility: Supine to Sit, Sit to Supine     Supine to sit: HOB elevated, Min assist Sit to supine: HOB elevated, Min guard   General bed mobility comments: Increased time/effort. Use of rails.    Transfers                          Balance Overall balance assessment: History of Falls (Pt reports falling "Just about every day") Sitting-balance support: Single extremity  supported Sitting balance-Leahy Scale: Poor Sitting balance - Comments: Posterior LOB when raises UEs Postural control: Posterior lean Standing balance support: Bilateral upper extremity supported, During functional activity, Reliant on assistive device for balance Standing balance-Leahy Scale: Poor                              ADL either performed or assessed with clinical judgement   ADL Overall ADL's : Needs assistance/impaired Eating/Feeding: Set up;Sitting   Grooming: Sitting;Wash/dry hands;Set up   Upper Body Bathing: Minimal assistance;Sitting;Cueing for safety;Cueing for sequencing   Lower Body Bathing: Moderate assistance;Sit to/from stand;Sitting/lateral leans;+2 for safety/equipment;Cueing for safety   Upper Body Dressing : Minimal assistance;Sitting;Cueing for sequencing   Lower Body Dressing: Moderate assistance;Maximal assistance;+2 for safety/equipment;Cueing for safety;Cueing for sequencing;Sit to/from stand;Sitting/lateral leans Lower Body Dressing Details (indicate cue type and reason): Moderate assist sitting. Maximum assist standing. Toilet Transfer: Moderate assistance;Rolling walker (2 wheels);BSC/3in1;Ambulation Toilet Transfer Details (indicate cue type and reason): Pt had 2 unsuccessful attemtps from stand from EOB with Min As. Pt required Moderate assist with pt using back of legs on bed for leverage. Pt ambulated in room with RW and posterior drift. Pt required Min-Mod As for balance and constant cues for sequencing and walker safety/proximity. Mod assist for stand to sit to EOB with poor eccentric control. Toileting- Clothing Manipulation and Hygiene: Moderate assistance;Sit to/from stand;Sitting/lateral lean Toileting - Clothing Manipulation Details (indicate cue type and reason): Based on general assessment.             Vision Baseline Vision/History: 1 Wears glasses Ability to See in Adequate Light: 1 Impaired Additional Comments: Pt reports increased blurring lately. Read clock on wall accurately. Intact smooth pursuits.     Perception     Praxis      Pertinent Vitals/Pain Pain Assessment Pain Assessment: Faces Faces Pain Scale: Hurts even more Pain Location: L shoulder and LT wrist. Pain Descriptors / Indicators: Discomfort,  Grimacing, Guarding Pain Intervention(s): Limited activity within patient's tolerance, Monitored during session (RN notified)     Hand Dominance Right   Extremity/Trunk Assessment Upper Extremity Assessment Upper Extremity Assessment: LUE deficits/detail RUE Coordination: decreased fine motor (D5 trigger finger, dysmetria with finger to nose) LUE Deficits / Details: L UE with weakness, decreased sensation, decreased coordination. Has very weak compostire grip ~3-/5, Has wrist and LT soulder pain with limited shoulder ROM to ~70 degrees and wrist ROM WFL but slow to move and painful with weight bearing when scooting to EOB. LUE Sensation: decreased light touch LUE Coordination: decreased fine motor;decreased gross motor (Finger to nose very delayed and inaccurate)   Lower Extremity Assessment Lower Extremity Assessment: RLE deficits/detail;LLE deficits/detail RLE Deficits / Details: ROM WFL ; MMT 5/5 LLE Deficits / Details: ROM WFL; MMT: ankle 4/5, knee ext 4/5 and painful in hamstrings, hip 3/5 LLE Sensation: decreased light touch LLE Coordination: decreased gross motor;decreased fine motor       Communication     Cognition Arousal/Alertness: Lethargic (More alert once EOB) Behavior During Therapy: WFL for tasks assessed/performed Overall Cognitive Status: Impaired/Different from baseline Area of Impairment: Awareness, Following commands, Safety/judgement, Problem solving                       Following Commands: Follows multi-step commands inconsistently, Follows multi-step commands with increased time Safety/Judgement: Decreased awareness of safety, Decreased awareness of deficits Awareness: Emergent Problem Solving: Difficulty sequencing, Requires verbal cues, Requires tactile cues  General Comments       Exercises     Shoulder Instructions      Home Living                               Home Equipment: Grab bars - tub/shower;Cane - single  point          Prior Functioning/Environment                          OT Problem List: Decreased range of motion;Decreased strength;Decreased coordination;Impaired sensation;Decreased activity tolerance;Decreased safety awareness;Decreased knowledge of use of DME or AE;Impaired balance (sitting and/or standing);Impaired vision/perception;Impaired UE functional use      OT Treatment/Interventions:      OT Goals(Current goals can be found in the care plan section) Acute Rehab OT Goals Patient Stated Goal: Per spouse: For pt to progress to point where she can safety assist him at home. OT Goal Formulation: With family Time For Goal Achievement: 06/05/22 Potential to Achieve Goals: Good ADL Goals Pt Will Perform Grooming: standing;with supervision Pt Will Perform Lower Body Dressing: with supervision;with adaptive equipment;sit to/from stand;sitting/lateral leans Pt Will Transfer to Toilet: with supervision;ambulating;bedside commode Pt Will Perform Toileting - Clothing Manipulation and hygiene: with supervision;sitting/lateral leans;sit to/from stand Pt Will Perform Tub/Shower Transfer: with min guard assist;shower seat;ambulating;with caregiver independent in assisting;Shower transfer Pt/caregiver will Perform Home Exercise Program: Increased ROM;Increased strength;Both right and left upper extremity;With Supervision;With minimal assist  OT Frequency:      Co-evaluation              AM-PAC OT "6 Clicks" Daily Activity     Outcome Measure Help from another person eating meals?: A Little Help from another person taking care of personal grooming?: A Little Help from another person toileting, which includes using toliet, bedpan, or urinal?: A Lot Help from another person bathing (including washing, rinsing, drying)?: A Lot Help from another person to put on and taking off regular upper body clothing?: A Little Help from another person to put on and taking off regular  lower body clothing?: A Lot 6 Click Score: 15   End of Session Equipment Utilized During Treatment: Gait belt;Rolling walker (2 wheels) Nurse Communication: Other (comment) (LT wrist pain with recommendation of simple wrist brace. RN witnessed pt up and ambulating in room with OT)  Activity Tolerance: Patient tolerated treatment well Patient left: in bed;with nursing/sitter in room;with family/visitor present  OT Visit Diagnosis: Unsteadiness on feet (R26.81);Repeated falls (R29.6);Muscle weakness (generalized) (M62.81);History of falling (Z91.81);Pain;Hemiplegia and hemiparesis Hemiplegia - Right/Left: Left Hemiplegia - dominant/non-dominant: Non-Dominant Hemiplegia - caused by: Cerebral infarction Pain - Right/Left: Left Pain - part of body: Shoulder;Hand;Arm                Time: [id number] OT Time Calculation (min): 27 min Charges:  OT General Charges $OT Visit: 1 Visit OT Evaluation $OT Eval Low Complexity: 1 Low OT Treatments $Therapeutic Activity: 8-22 mins  Anderson Malta, OT Acute Rehab Services Office: [phone] 05/22/2022  Julien Girt 05/22/2022, 12:35 PM

## 2022-05-22 NOTE — Progress Notes (Addendum)
Physical Therapy Treatment Patient Details Name: Grant [name] MRN: [id number] DOB: 10-05-1945 Today's Date: 05/22/2022   History of Present Illness Pt is 76 yo male admitted on 11/14/21 with L sided weakness for 9 days but was out of town so waited to come to hospital until returned home.  CT was negative for acute stroke and pt unable to have MRI.  Pt reports a fall onto his LT shoulder and hand ~2 months ago with LT wrist and LT shoulder pain since that time. He denies getting any x-rays: "I don't like to go to the doctor." Pt with hx of afib, AICD, dm, CAD, HLD.    PT Comments    Pt reported extrema fatigue this am and requested PTA return later. PTA returned in the PM and pt continues to complain of fatigue, even nodding off during session, but did attempt to participate with therapy. Pt required significantly increased assistance with ambulation this session compared to last. Noted pt's speech was slurred. Pts wife present and expressing concern that his symptoms are worse than yesterday. Rn notified of concerns and contacted MD. Feel that based on today's presentation pt would be more appropriate for AIR level therapies before d/c home. D/c plan updated and discussed with PT. Will continue to follow acutely for mobility progression.    Recommendations for follow up therapy are one component of a multi-disciplinary discharge planning process, led by the attending physician.  Recommendations may be updated based on patient status, additional functional criteria and insurance authorization.  Follow Up Recommendations  Acute inpatient rehab (3hours/day)     Assistance Recommended at Discharge Intermittent Supervision/Assistance  Patient can return home with the following A little help with bathing/dressing/bathroom;Assistance with cooking/housework;Help with stairs or ramp for entrance;A lot of help with walking and/or transfers   Equipment Recommendations  Rolling walker (2 wheels)     Recommendations for Other Services Rehab consult     Precautions / Restrictions Precautions Precautions: Fall Precaution Comments: LT wrist pain with WB (fall 2 months ago on hand. No imaging) Restrictions Weight Bearing Restrictions: No     Mobility  Bed Mobility Overal bed mobility: Needs Assistance Bed Mobility: Supine to Sit     Supine to sit: HOB elevated, Min assist     General bed mobility comments: Assist for trunk elevation only    Transfers Overall transfer level: Needs assistance Equipment used: Rolling walker (2 wheels) Transfers: Sit to/from Stand Sit to Stand: Min guard, Min assist           General transfer comment: min guard to stand from EOB, min A after pt was fatigued.    Ambulation/Gait Ambulation/Gait assistance: Mod assist Gait Distance (Feet): 10 Feet Assistive device: Rolling walker (2 wheels) Gait Pattern/deviations: Decreased stride length, Decreased stance time - left, Step-through pattern, Step-to pattern, Decreased step length - left, Decreased weight shift to right Gait velocity: decreased     General Gait Details: Pt struggling to clear LLE and near buckling when in stance phase on LLE. Manual facilitation given at quads to prevent buclking. Max verbal and tactile cues for sequencing and technique with RA. Physical assist given for RW management. Pt requesing to sit before stepping out of room as he was dizzy. Dizziness did no resolve with seated rest break. Further ambulation deffered.   Stairs             Wheelchair Mobility    Modified Rankin (Stroke Patients Only) Modified Rankin (Stroke Patients Only) Pre-Morbid Rankin Score: No significant  disability Modified Rankin: Moderately severe disability     Balance Overall balance assessment: History of Falls (Pt reports falling "Just about every day") Sitting-balance support: Single extremity supported Sitting balance-Leahy Scale: Poor Sitting balance - Comments:  Posterior LOB when raises UEs Postural control: Posterior lean Standing balance support: Bilateral upper extremity supported, During functional activity, Reliant on assistive device for balance Standing balance-Leahy Scale: Poor                              Cognition Arousal/Alertness: Lethargic (More alert once EOB) Behavior During Therapy: WFL for tasks assessed/performed Overall Cognitive Status: Impaired/Different from baseline Area of Impairment: Awareness, Following commands, Safety/judgement, Problem solving                       Following Commands: Follows multi-step commands inconsistently, Follows multi-step commands with increased time Safety/Judgement: Decreased awareness of safety, Decreased awareness of deficits Awareness: Emergent Problem Solving: Difficulty sequencing, Requires verbal cues, Requires tactile cues General Comments: Lethargic and slurring words. Seemed unaware of deficits, wanting to continue gait even though he was dizzy and gait was very unsteady.        Exercises      General Comments General comments (skin integrity, edema, etc.): Wife present and expressing concern over pt's regression.      Pertinent Vitals/Pain Pain Assessment Pain Assessment: Faces Faces Pain Scale: Hurts little more Pain Location: L shoulder and LT wrist. Pain Descriptors / Indicators: Discomfort, Grimacing, Guarding Pain Intervention(s): Monitored during session, Limited activity within patient's tolerance, Repositioned    Home Living                   Home Equipment: Grab bars - tub/shower;Cane - single point      Prior Function            PT Goals (current goals can now be found in the care plan section) Acute Rehab PT Goals Patient Stated Goal: return home; ambulate with cane PT Goal Formulation: With patient/family Time For Goal Achievement: 06/04/22 Potential to Achieve Goals: Good    Frequency    Min 4X/week      PT  Plan Discharge plan needs to be updated    Co-evaluation              AM-PAC PT "6 Clicks" Mobility   Outcome Measure  Help needed turning from your back to your side while in a flat bed without using bedrails?: None Help needed moving from lying on your back to sitting on the side of a flat bed without using bedrails?: A Little Help needed moving to and from a bed to a chair (including a wheelchair)?: A Little Help needed standing up from a chair using your arms (e.g., wheelchair or bedside chair)?: A Little Help needed to walk in hospital room?: A Little Help needed climbing 3-5 steps with a railing? : A Little 6 Click Score: 19    End of Session Equipment Utilized During Treatment: Gait belt Activity Tolerance: Patient tolerated treatment well Patient left: with family/visitor present;in chair;with nursing/sitter in room Nurse Communication: Mobility status;Other (comment) (Concerns about pt's regression and slurred speach) PT Visit Diagnosis: Other abnormalities of gait and mobility (R26.89);Hemiplegia and hemiparesis Hemiplegia - Right/Left: Left Hemiplegia - dominant/non-dominant: Non-dominant Hemiplegia - caused by: Cerebral infarction     Time: 1350-1413 PT Time Calculation (min) (ACUTE ONLY): 23 min  Charges:  $Gait Training: 23-37 mins  Benjiman Core, PTA Acute Rehab  Allena Katz 05/22/2022, 3:08 PM  Reviewed and agree with updated POC for AIR Candie Mile, PT, DPT Physical Therapist Acute Rehabilitation Services [facility]

## 2022-05-22 NOTE — Progress Notes (Signed)
PROGRESS NOTE    Grant [name]  [id number] DOB: Dec 11, 1945 DOA: 05/20/2022 PCP: [facility]   Brief Narrative:  [name] is a 76 y.o. male with medical history significant of chronic systolic CHF with AICD; afib on Eliquis; BPH; CAD; DM; HTN; HLD; hypothyroidism; and OSA presenting with L-sided weakness and gait disturbance x 9 days.  Admitted for further workup for presumed stroke.  Neurology consulted.  Assessment & Plan:   Principal Problem:   Acute ischemic stroke [facility]) Active Problems:   Essential hypertension   Tobacco abuse   Hypothyroidism   CAD (coronary artery disease)   ICD (implantable cardioverter-defibrillator) in place   Diabetes mellitus with complication (HCC)   PAF (paroxysmal atrial fibrillation) (HCC)   Left-sided weakness   Atrial fibrillation, chronic (HCC)   Anticoagulated   H/O medication noncompliance   Subacute CVA -Present more than 9 days prior to admission as [place] -MRI unobtainable given patient's AICD is not MRI compatible, CTA per neurology with no acute intracranial abnormalities.  Severe stenosis of the right vertebral artery. -Continue Eliquis, aspirin, statin per neurology -Echo pending -PT/OT recommending inpatient rehab at this time   Afib -Continue amiodarone for rate control -Continue Eliquis   HTN -Continue to follow, not currently on medication per med list *although it is yet to be confirmed   HLD -Continue Lipitor, markedly elevated triglycerides and VLDL with depressed HDL   DM, insulin-dependent, well-controlled -Continue sliding scale insulin, hypoglycemic protocol   Chronic systolic CHF/CAD -AICD which is NOT MRI compatible -Appears compensated -Continue ASA as above   Hypothyroidism -Continue Synthroid at current dose for now    OSA -Continue CPAP   Mood d/o -Continue amitriptyline, Lexapro, gabapentin   Obesity -Hold Ozempic -Appears to be generally  well controlled at this time   Tobacco dependence -Encourage cessation.   -This was discussed with the patient and should be reviewed on an ongoing basis.   -Patch ordered    DVT prophylaxis: Eliquis Code Status: Full Family Communication: Wife updated over the phone  Status is: Inpatient  Dispo: The patient is from: Home              Anticipated d/c is to: Inpatient rehab              Anticipated d/c date is: Pending acceptance and insurance approval              Patient currently IS medically stable for discharge; awaiting safe disposition inpatient rehab  Consultants:  Neurology  Procedures:  None  Antimicrobials:  None indicated  Subjective: No acute issues or events overnight denies nausea vomiting diarrhea constipation headache fevers chills or chest pain  Objective: Vitals:   05/22/22 0010 05/22/22 0445 05/22/22 0849 05/22/22 1219  BP: 102/75 (!) 92/59 93/68   Pulse: 84 77 78 89  Resp: 20 14 16    Temp: 98 F (36.7 C) (!) 97.5 F (36.4 C) 97.6 F (36.4 C)   TempSrc: Oral  Oral   SpO2: 93% 94% 96%   Weight:      Height:        Intake/Output Summary (Last 24 hours) at 05/22/2022 1259 Last data filed at 05/21/2022 1319 Gross per 24 hour  Intake 55.93 ml  Output --  Net 55.93 ml   Filed Weights   05/21/22 0526  Weight: 99.8 kg    Examination:  General:  Pleasantly resting in bed, No acute distress. HEENT:  Normocephalic atraumatic.  Sclerae  nonicteric, noninjected.  Extraocular movements intact bilaterally. Neck:  Without mass or deformity.  Trachea is midline. Lungs:  Clear to auscultate bilaterally without rhonchi, wheeze, or rales. Heart:  Regular rate and rhythm.  Without murmurs, rubs, or gallops. Abdomen:  Soft, nontender, nondistended.  Without guarding or rebound. Extremities: Without cyanosis, clubbing, edema, or obvious deformity. Vascular:  Dorsalis pedis and posterior tibial pulses palpable bilaterally. Skin:  Warm and dry, no erythema,  no ulcerations.  Data Reviewed: I have personally reviewed following labs and imaging studies  CBC: Recent Labs  Lab 05/20/22 1934  WBC 7.0  NEUTROABS 4.2  HGB 15.0  HCT 44.9  MCV 88.6  PLT [id number]   Basic Metabolic Panel: Recent Labs  Lab 05/20/22 1934  NA 139  K 3.3*  CL 99  CO2 29  GLUCOSE 214*  BUN 10  CREATININE 1.32*  CALCIUM 9.3   GFR: Estimated Creatinine Clearance: 59.1 mL/min (A) (by C-G formula based on SCr of 1.32 mg/dL (H)). Liver Function Tests: Recent Labs  Lab 05/20/22 1934  AST 17  ALT 18  ALKPHOS 62  BILITOT 0.6  PROT 7.5  ALBUMIN 4.6   No results for input(s): "LIPASE", "AMYLASE" in the last 168 hours. No results for input(s): "AMMONIA" in the last 168 hours. Coagulation Profile: Recent Labs  Lab 05/20/22 1934  INR 1.1   Cardiac Enzymes: No results for input(s): "CKTOTAL", "CKMB", "CKMBINDEX", "TROPONINI" in the last 168 hours. BNP (last 3 results) No results for input(s): "PROBNP" in the last 8760 hours. HbA1C: Recent Labs    05/21/22 1802  HGBA1C 9.9*   CBG: Recent Labs  Lab 05/21/22 1201 05/21/22 1837 05/21/22 2159 05/22/22 0614 05/22/22 1210  GLUCAP 167* 179* 239* 175* 241*   Lipid Profile: Recent Labs    05/22/22 0241  CHOL 158  HDL 27*  LDLCALC 82  TRIG 247*  CHOLHDL 5.9   Thyroid Function Tests: No results for input(s): "TSH", "T4TOTAL", "FREET4", "T3FREE", "THYROIDAB" in the last 72 hours. Anemia Panel: No results for input(s): "VITAMINB12", "FOLATE", "FERRITIN", "TIBC", "IRON", "RETICCTPCT" in the last 72 hours. Sepsis Labs: No results for input(s): "PROCALCITON", "LATICACIDVEN" in the last 168 hours.  No results found for this or any previous visit (from the past 240 hour(s)).       Radiology Studies: ECHOCARDIOGRAM COMPLETE  Result Date: 05/21/2022    ECHOCARDIOGRAM REPORT   Patient Name:   Grant [name] Date of Exam: 05/21/2022 Medical Rec #:  [id number]            Height:       74.0 in  Accession #:    [id number]           Weight:       220.0 lb Date of Birth:  Apr 29, 1946            BSA:          2.263 m Patient Age:    37 years             BP:           158/92 mmHg Patient Gender: M                    HR:           69 bpm. Exam Location:  Inpatient Procedure: 2D Echo, Cardiac Doppler, Color Doppler and Intracardiac            Opacification Agent Indications:    Stroke I63.9  History:  Patient has no prior history of Echocardiogram examinations.                 CAD, Stroke; Risk Factors:Hypertension, Current Smoker and                 Diabetes.  Sonographer:    Greer Pickerel Referring Phys: [id number] JENNIFER YATES  Sonographer Comments: Suboptimal parasternal window, suboptimal apical window, suboptimal subcostal window, no subcostal window and Technically difficult study due to poor echo windows. Image acquisition challenging due to patient body habitus and Image acquisition challenging due to respiratory motion. IMPRESSIONS  1. Extremely limited due to poor sound wave transmission; LV function appears to be preserved on definity images; suggest TEE or cardiac MRI to better evaluate if clinically indicated.  2. Left ventricular ejection fraction, by estimation, is 60 to 65%. The left ventricle has normal function. The left ventricle has no regional wall motion abnormalities. Left ventricular diastolic parameters are consistent with Grade I diastolic dysfunction (impaired relaxation).  3. Right ventricular systolic function was not well visualized. The right ventricular size is not well visualized.  4. The mitral valve was not well visualized. not well visualized mitral valve regurgitation.  5. Tricuspid valve regurgitation not well visualized.  6. The aortic valve is normal in structure. Aortic valve regurgitation Not well visualized.  7. Pulmonic valve regurgitation not well visualized.  8. Aortic dilatation noted. There is mild dilatation of the aortic root, measuring 40 mm. FINDINGS  Left  Ventricle: Left ventricular ejection fraction, by estimation, is 60 to 65%. The left ventricle has normal function. The left ventricle has no regional wall motion abnormalities. Definity contrast agent was given IV to delineate the left ventricular  endocardial borders. The left ventricular internal cavity size was normal in size. Suboptimal image quality limits for assessment of left ventricular hypertrophy. Left ventricular diastolic parameters are consistent with Grade I diastolic dysfunction (impaired relaxation). Right Ventricle: The right ventricular size is not well visualized. Right vetricular wall thickness was not well visualized. Right ventricular systolic function was not well visualized. Left Atrium: Left atrial size was not well visualized. Right Atrium: Right atrial size was not well visualized. Pericardium: There is no evidence of pericardial effusion. Mitral Valve: The mitral valve was not well visualized. Not well visualized mitral valve regurgitation. Tricuspid Valve: The tricuspid valve is grossly normal. Tricuspid valve regurgitation not well visualized. Aortic Valve: The aortic valve is normal in structure. Aortic valve regurgitation Not well visualized. Pulmonic Valve: The pulmonic valve was not well visualized. Pulmonic valve regurgitation not well visualized. Aorta: Aortic dilatation noted. There is mild dilatation of the aortic root, measuring 40 mm. IAS/Shunts: The interatrial septum was not well visualized. Additional Comments: Extremely limited due to poor sound wave transmission; LV function appears to be preserved on definity images; suggest TEE or cardiac MRI to better evaluate if clinically indicated. A device lead is visualized.  LEFT VENTRICLE PLAX 2D LVOT diam:     1.90 cm   Diastology LVOT Area:     2.84 cm  LV e' medial:    5.91 cm/s                          LV E/e' medial:  6.3                          LV e' lateral:   6.37 cm/s  LV E/e' lateral: 5.8   RIGHT VENTRICLE RV S prime:     14.80 cm/s TAPSE (M-mode): 2.2 cm LEFT ATRIUM           Index LA Vol (A4C): 48.4 ml 21.38 ml/m   AORTA Ao Root diam: 4.00 cm MITRAL VALVE MV Area (PHT): 5.31 cm    SHUNTS MV Decel Time: 143 msec    Systemic Diam: 1.90 cm MV E velocity: 37.20 cm/s MV A velocity: 57.60 cm/s MV E/A ratio:  0.65 Kirk Ruths MD Electronically signed by Kirk Ruths MD Signature Date/Time: 05/21/2022/5:24:27 PM    Final    CT ANGIO HEAD NECK W WO CM  Result Date: 05/21/2022 CLINICAL DATA:  Stroke suspected EXAM: CT ANGIOGRAPHY HEAD AND NECK TECHNIQUE: Multidetector CT imaging of the head and neck was performed using the standard protocol during bolus administration of intravenous contrast. Multiplanar CT image reconstructions and MIPs were obtained to evaluate the vascular anatomy. Carotid stenosis measurements (when applicable) are obtained utilizing NASCET criteria, using the distal internal carotid diameter as the denominator. RADIATION DOSE REDUCTION: This exam was performed according to the departmental dose-optimization program which includes automated exposure control, adjustment of the mA and/or kV according to patient size and/or use of iterative reconstruction technique. CONTRAST:  52mL OMNIPAQUE IOHEXOL 350 MG/ML SOLN COMPARISON:  CT Head 05/20/22 FINDINGS: CT HEAD FINDINGS Brain: Unchanged right thalamic small-vessel infarcts. No hemorrhage. No hydrocephalus. No extra-axial fluid collection. No CT evidence of new infarct. 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 Vascular: See below. There are dense atherosclerotic calcifications of the intracranial vasculature. Skull: Normal. Negative for fracture or focal lesion. Sinuses/Orbits: Air-fluid level in the left maxillary sinus. Other: None. Review of the MIP images confirms the above findings CTA NECK FINDINGS Aortic arch: Standard branching. Imaged portion shows no evidence of aneurysm or  dissection. No significant stenosis of the major arch vessel origins. Right carotid system: There is mild narrowing of the origin of the right SCA secondary to calcified atherosclerotic plaque. Left carotid system: No evidence of dissection, stenosis (50% or greater), or occlusion. Vertebral arteries: There is severe nearly occlusive stenosis at the origin of the right vertebral artery. There are additional scattered regions of mild-to-moderate atherosclerotic narrowing in the V3 and V4 segments of the bilateral vertebral arteries Skeleton: Negative. Other neck: Negative. Upper chest: Moderate centrilobular emphysema. Review of the MIP images confirms the above findings CTA HEAD FINDINGS Anterior circulation: No significant stenosis, proximal occlusion, aneurysm, or vascular malformation. Posterior circulation: No significant stenosis, proximal occlusion, aneurysm, or vascular malformation. Venous sinuses: As permitted by contrast timing, patent. Anatomic variants: None Review of the MIP images confirms the above findings IMPRESSION: 1. No acute intracranial abnormality. Unchanged age indeterminate right thalamic small-vessel infarct. 2. No intracranial large vessel occlusion. 3. Severe nearly occlusive stenosis of the origin of the right vertebral artery. Additional scattered regions of mild-to-moderate atherosclerotic narrowing in the V3 and V4 segments of the bilateral vertebral arteries. 4. Air-fluid level in the left maxillary sinus, correlate for symptoms of acute sinusitis. Emphysema (ICD10-J43.9). Electronically Signed   By: Marin Roberts M.D.   On: 05/21/2022 16:59   CT HEAD WO CONTRAST  Result Date: 05/20/2022 CLINICAL DATA:  Left-sided weakness EXAM: CT HEAD WITHOUT CONTRAST TECHNIQUE: Contiguous axial images were obtained from the base of the skull through the vertex without intravenous contrast. RADIATION DOSE REDUCTION: This exam was performed according to the departmental dose-optimization  program which includes  automated exposure control, adjustment of the mA and/or kV according to patient size and/or use of iterative reconstruction technique. COMPARISON:  None Available. FINDINGS: Brain: There is no mass, hemorrhage or extra-axial collection. There is generalized atrophy without lobar predilection. Hypodensity of the white matter is most commonly associated with chronic microvascular disease. 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 Vascular: Atherosclerotic calcification of the vertebral and internal carotid arteries at the skull base. No abnormal hyperdensity of the major intracranial arteries or dural venous sinuses. Skull: The visualized skull base, calvarium and extracranial soft tissues are normal. Sinuses/Orbits: No fluid levels or advanced mucosal thickening of the visualized paranasal sinuses. No mastoid or middle ear effusion. The orbits are normal. IMPRESSION: 1. No acute intracranial abnormality. 2. Generalized atrophy and findings of chronic microvascular disease. 3. 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 Electronically Signed   By: Deatra Robinson M.D.   On: 05/20/2022 20:12    Scheduled Meds:  amiodarone  200 mg Oral Daily   amitriptyline  75 mg Oral QHS   apixaban  5 mg Oral BID   aspirin EC  81 mg Oral Daily   atorvastatin  40 mg Oral QHS   escitalopram  10 mg Oral QHS   fluticasone  2 spray Each Nare Daily   gabapentin  300 mg Oral TID   insulin aspart  0-15 Units Subcutaneous TID WC   insulin glargine-yfgn  32 Units Subcutaneous QHS   levothyroxine  50 mcg Oral QAC breakfast   multivitamin with minerals  1 tablet Oral Daily   terazosin  5 mg Oral BID    LOS: 1 day   Time spent:  Azucena Fallen, DO [facility]  If 7PM-7AM, please contact night-coverage [email or web address]  05/22/2022, 12:59 PM

## 2022-05-22 NOTE — Progress Notes (Signed)
Inpatient Rehab Admissions Coordinator Note:   Per therapy patient was screened for CIR candidacy by Buell Parcel Luvenia Starch, CCC-SLP. At this time, pt appears to be a potential candidate for CIR. I will place an order for rehab consult for full assessment, per our protocol.  Please contact me any with questions.Wolfgang Phoenix, MS, CCC-SLP Admissions Coordinator [phone] 05/22/22 5:31 PM

## 2022-05-22 NOTE — Progress Notes (Addendum)
STROKE TEAM PROGRESS NOTE   INTERVAL HISTORY His wife is at the bedside.   Patient is awake and alert sitting up in the bed in NAD. Patient states his symptoms are better but not back to baseline. He states he did not sleep well last pm .  No new neurological events overnight   Vitals:   05/22/22 0849 05/22/22 1215 05/22/22 1219 05/22/22 1222  BP: 93/68 94/64 (!) 84/67 94/62  Pulse: 78 85 85 87  Resp: 16     Temp: 97.6 F (36.4 C)     TempSrc: Oral     SpO2: 96%     Weight:      Height:       CBC:  Recent Labs  Lab 05/20/22 1934  WBC 7.0  NEUTROABS 4.2  HGB 15.0  HCT 44.9  MCV 88.6  PLT [id number]   Basic Metabolic Panel:  Recent Labs  Lab 05/20/22 1934  NA 139  K 3.3*  CL 99  CO2 29  GLUCOSE 214*  BUN 10  CREATININE 1.32*  CALCIUM 9.3   Lipid Panel:  Recent Labs  Lab 05/22/22 0241  CHOL 158  TRIG 247*  HDL 27*  CHOLHDL 5.9  VLDL 49*  LDLCALC 82   HgbA1c:  Recent Labs  Lab 05/21/22 1802  HGBA1C 9.9*   Urine Drug Screen: No results for input(s): "LABOPIA", "COCAINSCRNUR", "LABBENZ", "AMPHETMU", "THCU", "LABBARB" in the last 168 hours.  Alcohol Level  Recent Labs  Lab 05/20/22 2340  ETH 15*    IMAGING past 24 hours ECHOCARDIOGRAM COMPLETE  Result Date: 05/21/2022    ECHOCARDIOGRAM REPORT   Patient Name:   GARION TEAHAN Murtagh Date of Exam: 05/21/2022 Medical Rec #:  [id number]            Height:       74.0 in Accession #:    [id number]           Weight:       220.0 lb Date of Birth:  22-Sep-1945            BSA:          2.263 m Patient Age:    76 years             BP:           158/92 mmHg Patient Gender: M                    HR:           69 bpm. Exam Location:  Inpatient Procedure: 2D Echo, Cardiac Doppler, Color Doppler and Intracardiac            Opacification Agent Indications:    Stroke I63.9  History:        Patient has no prior history of Echocardiogram examinations.                 CAD, Stroke; Risk Factors:Hypertension, Current Smoker and                  Diabetes.  Sonographer:    Greer Pickerel Referring Phys: [id number] JENNIFER YATES  Sonographer Comments: Suboptimal parasternal window, suboptimal apical window, suboptimal subcostal window, no subcostal window and Technically difficult study due to poor echo windows. Image acquisition challenging due to patient body habitus and Image acquisition challenging due to respiratory motion. IMPRESSIONS  1. Extremely limited due to poor sound wave transmission; LV function appears to be preserved on definity images; suggest TEE or  cardiac MRI to better evaluate if clinically indicated.  2. Left ventricular ejection fraction, by estimation, is 60 to 65%. The left ventricle has normal function. The left ventricle has no regional wall motion abnormalities. Left ventricular diastolic parameters are consistent with Grade I diastolic dysfunction (impaired relaxation).  3. Right ventricular systolic function was not well visualized. The right ventricular size is not well visualized.  4. The mitral valve was not well visualized. not well visualized mitral valve regurgitation.  5. Tricuspid valve regurgitation not well visualized.  6. The aortic valve is normal in structure. Aortic valve regurgitation Not well visualized.  7. Pulmonic valve regurgitation not well visualized.  8. Aortic dilatation noted. There is mild dilatation of the aortic root, measuring 40 mm. FINDINGS  Left Ventricle: Left ventricular ejection fraction, by estimation, is 60 to 65%. The left ventricle has normal function. The left ventricle has no regional wall motion abnormalities. Definity contrast agent was given IV to delineate the left ventricular  endocardial borders. The left ventricular internal cavity size was normal in size. Suboptimal image quality limits for assessment of left ventricular hypertrophy. Left ventricular diastolic parameters are consistent with Grade I diastolic dysfunction (impaired relaxation). Right Ventricle: The right ventricular  size is not well visualized. Right vetricular wall thickness was not well visualized. Right ventricular systolic function was not well visualized. Left Atrium: Left atrial size was not well visualized. Right Atrium: Right atrial size was not well visualized. Pericardium: There is no evidence of pericardial effusion. Mitral Valve: The mitral valve was not well visualized. Not well visualized mitral valve regurgitation. Tricuspid Valve: The tricuspid valve is grossly normal. Tricuspid valve regurgitation not well visualized. Aortic Valve: The aortic valve is normal in structure. Aortic valve regurgitation Not well visualized. Pulmonic Valve: The pulmonic valve was not well visualized. Pulmonic valve regurgitation not well visualized. Aorta: Aortic dilatation noted. There is mild dilatation of the aortic root, measuring 40 mm. IAS/Shunts: The interatrial septum was not well visualized. Additional Comments: Extremely limited due to poor sound wave transmission; LV function appears to be preserved on definity images; suggest TEE or cardiac MRI to better evaluate if clinically indicated. A device lead is visualized.  LEFT VENTRICLE PLAX 2D LVOT diam:     1.90 cm   Diastology LVOT Area:     2.84 cm  LV e' medial:    5.91 cm/s                          LV E/e' medial:  6.3                          LV e' lateral:   6.37 cm/s                          LV E/e' lateral: 5.8  RIGHT VENTRICLE RV S prime:     14.80 cm/s TAPSE (M-mode): 2.2 cm LEFT ATRIUM           Index LA Vol (A4C): 48.4 ml 21.38 ml/m   AORTA Ao Root diam: 4.00 cm MITRAL VALVE MV Area (PHT): 5.31 cm    SHUNTS MV Decel Time: 143 msec    Systemic Diam: 1.90 cm MV E velocity: 37.20 cm/s MV A velocity: 57.60 cm/s MV E/A ratio:  0.65 Olga Millers MD Electronically signed by Olga Millers MD Signature Date/Time: 05/21/2022/5:24:27 PM    Final  CT ANGIO HEAD NECK W WO CM  Result Date: 05/21/2022 CLINICAL DATA:  Stroke suspected EXAM: CT ANGIOGRAPHY HEAD AND  NECK TECHNIQUE: Multidetector CT imaging of the head and neck was performed using the standard protocol during bolus administration of intravenous contrast. Multiplanar CT image reconstructions and MIPs were obtained to evaluate the vascular anatomy. Carotid stenosis measurements (when applicable) are obtained utilizing NASCET criteria, using the distal internal carotid diameter as the denominator. RADIATION DOSE REDUCTION: This exam was performed according to the departmental dose-optimization program which includes automated exposure control, adjustment of the mA and/or kV according to patient size and/or use of iterative reconstruction technique. CONTRAST:  80mL OMNIPAQUE IOHEXOL 350 MG/ML SOLN COMPARISON:  CT Head 05/20/22 FINDINGS: CT HEAD FINDINGS Brain: Unchanged right thalamic small-vessel infarcts. No hemorrhage. No hydrocephalus. No extra-axial fluid collection. No CT evidence of new infarct. 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 Vascular: See below. There are dense atherosclerotic calcifications of the intracranial vasculature. Skull: Normal. Negative for fracture or focal lesion. Sinuses/Orbits: Air-fluid level in the left maxillary sinus. Other: None. Review of the MIP images confirms the above findings CTA NECK FINDINGS Aortic arch: Standard branching. Imaged portion shows no evidence of aneurysm or dissection. No significant stenosis of the major arch vessel origins. Right carotid system: There is mild narrowing of the origin of the right SCA secondary to calcified atherosclerotic plaque. Left carotid system: No evidence of dissection, stenosis (50% or greater), or occlusion. Vertebral arteries: There is severe nearly occlusive stenosis at the origin of the right vertebral artery. There are additional scattered regions of mild-to-moderate atherosclerotic narrowing in the V3 and V4 segments of the bilateral vertebral arteries Skeleton: Negative. Other neck:  Negative. Upper chest: Moderate centrilobular emphysema. Review of the MIP images confirms the above findings CTA HEAD FINDINGS Anterior circulation: No significant stenosis, proximal occlusion, aneurysm, or vascular malformation. Posterior circulation: No significant stenosis, proximal occlusion, aneurysm, or vascular malformation. Venous sinuses: As permitted by contrast timing, patent. Anatomic variants: None Review of the MIP images confirms the above findings IMPRESSION: 1. No acute intracranial abnormality. Unchanged age indeterminate right thalamic small-vessel infarct. 2. No intracranial large vessel occlusion. 3. Severe nearly occlusive stenosis of the origin of the right vertebral artery. Additional scattered regions of mild-to-moderate atherosclerotic narrowing in the V3 and V4 segments of the bilateral vertebral arteries. 4. Air-fluid level in the left maxillary sinus, correlate for symptoms of acute sinusitis. Emphysema (ICD10-J43.9). Electronically Signed   By: Marin Roberts M.D.   On: 05/21/2022 16:59    PHYSICAL EXAM  Temp:  [97.5 F (36.4 C)-98 F (36.7 C)] 97.6 F (36.4 C) (12/02 0849) Pulse Rate:  [71-87] 87 (12/02 1222) Resp:  [14-20] 16 (12/02 0849) BP: (84-131)/(59-82) 94/62 (12/02 1222) SpO2:  [93 %-96 %] 96 % (12/02 0849)  General - Well nourished, well developed, in no apparent distress. Cardiovascular - Regular rhythm and rate.  Mental Status -  Level of arousal and orientation to time, place, and person were intact. Mild dysarthria Language including expression, naming, repetition, comprehension was assessed and found intact. Attention span and concentration were normal. Recent and remote memory were intact. Fund of Knowledge was assessed and was intact.  Cranial Nerves II - XII - II - Visual field intact OU. III, IV, VI - Extraocular movements intact. V - Facial sensation intact bilaterally. VII - Facial movement intact bilaterally. VIII - Hearing & vestibular  intact bilaterally. X - Palate elevates symmetrically.  XI - Chin turning & shoulder shrug intact bilaterally. XII - Tongue protrusion intact.  Motor Strength - left arm 3/5, R arm 5/5, weak grip on left, left leg 3/5, right leg 4/5 Motor Tone - Muscle tone was assessed at the neck and appendages and was normal.  Sensory - decreased on left face and left leg   Coordination - The patient had normal movements in the hands and feet with no ataxia or dysmetria.  Tremor was absent.  Gait and Station - deferred.  ASSESSMENT/PLAN Mr. Shi Junior Mondor is a 76 y.o. male with history of chronic systolic CHF with AICD; afib on Eliquis; BPH; CAD; DM; HTN; HLD; hypothyroidism; and OSA presenting with L-sided weakness and gait disturbance x 9 days.   He and his wife went to [place] for Thanksgiving.  His left shoulder was hurting a lot on arrival with throbbing pains.  He noticed numbness along the entire left side to his foot starting at his face.  Each day it got worse and worse.   Stroke Subacute R MCA ischemic infarct likely due to P A fib on Eliquis  Etiology:  cardioembolic   CT head  1. No acute intracranial abnormality. 2. Generalized atrophy and findings of chronic microvascular disease. 3. 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 CTA head & neck  1. No acute intracranial abnormality. Unchanged age indeterminate right thalamic small-vessel infarct. 2. No intracranial large vessel occlusion. 3. Severe nearly occlusive stenosis of the origin of the right vertebral artery. Additional scattered regions of mild-to-moderate atherosclerotic narrowing in the V3 and V4 segments of the bilateral vertebral arteries. 4. Air-fluid level in the left maxillary sinus, correlate for symptoms of acute sinusitis.  MRI  unable to have due to AICD   2D Echo Extremely limited exam. EF 60-65%  LDL 82 HgbA1c 9.9 VTE prophylaxis - SCD's    Diet   Diet Carb Modified Fluid consistency:  Thin; Room service appropriate? Yes with Assist   aspirin 81 mg daily and Eliquis (apixaban) daily prior to admission, now on aspirin 81 mg daily and Eliquis (apixaban) daily.  Therapy recommendations:  CIR Disposition:  pending   Hypertension Home meds:  losartan,  Stable Permissive hypertension (OK if < 220/120) but gradually normalize in 5-7 days Long-term BP goal normotensive  Hyperlipidemia Home meds:  lipitor, resumed in hospital LDL 82, goal < 70 Continue statin at discharge  Diabetes type II UnControlled Home meds:  insulin  HgbA1c 9.9, goal < 7.0 CBGs Recent Labs    05/21/22 2159 05/22/22 0614 05/22/22 1210  GLUCAP 239* 175* 241*    SSI Will need close PCP follow up   Other Stroke Risk Factors Advanced Age >/= 65  Coronary artery disease Obstructive sleep apnea, on CPAP at home  Other Active Problems Hypothyroidism   Hospital day # 1  Beulah Gandy DNP, ACNPC-AG  [facility] Pager# [phone]   ATTENDING ATTESTATION:  Right thalamic CVA on CT. Unable to have MRI. Cardioembolic CVA. Back on eliquis and aspirin for stroke prevention. CIR pending.   Neurology team to sign off. Please call with questions.   Dr. Reeves Forth evaluated pt independently, reviewed imaging, chart, labs. Discussed and formulated plan with the Resident/APP. Changes were made to the note where appropriate. Please see APP/resident note above for details.     Krishon Adkison,MD   To contact Stroke Continuity provider, please refer to [email or web address] After hours, contact General Neurology

## 2022-05-23 ENCOUNTER — Inpatient Hospital Stay (HOSPITAL_COMMUNITY): Payer: No Typology Code available for payment source

## 2022-05-23 DIAGNOSIS — I639 Cerebral infarction, unspecified: Secondary | ICD-10-CM | POA: Diagnosis not present

## 2022-05-23 LAB — GLUCOSE, CAPILLARY
Glucose-Capillary: 176 mg/dL — ABNORMAL HIGH (ref 70–99)
Glucose-Capillary: 208 mg/dL — ABNORMAL HIGH (ref 70–99)
Glucose-Capillary: 210 mg/dL — ABNORMAL HIGH (ref 70–99)
Glucose-Capillary: 224 mg/dL — ABNORMAL HIGH (ref 70–99)

## 2022-05-23 LAB — BASIC METABOLIC PANEL
Anion gap: 12 (ref 5–15)
BUN: 9 mg/dL (ref 8–23)
CO2: 23 mmol/L (ref 22–32)
Calcium: 8.9 mg/dL (ref 8.9–10.3)
Chloride: 103 mmol/L (ref 98–111)
Creatinine, Ser: 1.48 mg/dL — ABNORMAL HIGH (ref 0.61–1.24)
GFR, Estimated: 48 mL/min — ABNORMAL LOW (ref >60)
Glucose, Bld: 190 mg/dL — ABNORMAL HIGH (ref 70–99)
Potassium: 3 mmol/L — ABNORMAL LOW (ref 3.5–5.1)
Sodium: 138 mmol/L (ref 135–145)

## 2022-05-23 LAB — CBC
HCT: 41.1 % (ref 39.0–52.0)
Hemoglobin: 14.1 g/dL (ref 13.0–17.0)
MCH: 30.2 pg (ref 26.0–34.0)
MCHC: 34.3 g/dL (ref 30.0–36.0)
MCV: 88 fL (ref 80.0–100.0)
Platelets: 107 10*3/uL — ABNORMAL LOW (ref 150–400)
RBC: 4.67 MIL/uL (ref 4.22–5.81)
RDW: 12.4 % (ref 11.5–15.5)
WBC: 8.9 10*3/uL (ref 4.0–10.5)
nRBC: 0 % (ref 0.0–0.2)

## 2022-05-23 LAB — URINALYSIS, ROUTINE W REFLEX MICROSCOPIC
Bilirubin Urine: NEGATIVE
Glucose, UA: 150 mg/dL — AB
Ketones, ur: NEGATIVE mg/dL
Leukocytes,Ua: NEGATIVE
Nitrite: NEGATIVE
Protein, ur: NEGATIVE mg/dL
Specific Gravity, Urine: 1.004 — ABNORMAL LOW (ref 1.005–1.030)
pH: 5 (ref 5.0–8.0)

## 2022-05-23 LAB — RAPID URINE DRUG SCREEN, HOSP PERFORMED
Amphetamines: NOT DETECTED
Barbiturates: NOT DETECTED
Benzodiazepines: NOT DETECTED
Cocaine: NOT DETECTED
Opiates: NOT DETECTED
Tetrahydrocannabinol: NOT DETECTED

## 2022-05-23 MED ORDER — POTASSIUM CHLORIDE CRYS ER 20 MEQ PO TBCR
40.0000 meq | EXTENDED_RELEASE_TABLET | ORAL | Status: AC
  Administered 2022-05-23 (×2): 40 meq via ORAL
  Filled 2022-05-23 (×2): qty 2

## 2022-05-23 MED ORDER — POLYETHYLENE GLYCOL 3350 17 G PO PACK
17.0000 g | PACK | Freq: Every day | ORAL | Status: DC
  Administered 2022-05-23 – 2022-05-25 (×3): 17 g via ORAL
  Filled 2022-05-23 (×4): qty 1

## 2022-05-23 MED ORDER — MAGNESIUM CITRATE PO SOLN
1.0000 | Freq: Once | ORAL | Status: DC
  Filled 2022-05-23: qty 296

## 2022-05-23 NOTE — Progress Notes (Addendum)
In and out cath using sterile tech. For urine specimens per MD instruction; patient tolerated without acute distress; patient has been voiding incontinent unable to collect specimen without cath.; wife reports incontinence is a new event for him.

## 2022-05-23 NOTE — Progress Notes (Addendum)
Called to room by patient's wife; patient is having chills and is tremulous all over; his is alert and talking; vitals signs obtained; voice sounds coarse; encouraged to cough and deep breath; O2 Sat 88%; applied 2l [place];abd. Is distended; bladder scanned due to irregular voiding; bladder scan less then 41ml; small results from laxatives given today; patient reports a numbness and pain that radiated down his left arm; has resolved now; MD notified; orders received.

## 2022-05-23 NOTE — Progress Notes (Addendum)
Temperature Of 102 F; orally taken;reported to MD; orders received.

## 2022-05-23 NOTE — Progress Notes (Signed)
After working with OT; patient assisted to the sink area for a bath; sat patient is chair in front of the sink; observed increased weakness in his left arm and leg with mobility to and from the sink; he did not stand without assistance; max assist with his bath and dressing; difficulty ambulating back to bed; MD notified of increased weakness and fatigue.

## 2022-05-23 NOTE — Progress Notes (Addendum)
Patient ambulated to the bathroom X2 staff members; he drops his left hand from the walker; he is slow to engage his left leg to move forward; he could not handle the walker and turning in front of the commode to sit without assistance; he had difficulty exiting the bathroom; leaning with ambulation back to bed; reports some dizziness.  Fall prevention/safety maintained; bed low locked and alarmed.

## 2022-05-23 NOTE — Progress Notes (Signed)
PROGRESS NOTE    [name]  [id number] DOB: [birth year] DOA: 05/20/2022 PCP: [facility]   Brief Narrative:  Grant [name] is a 76 y.o. male with medical history significant of chronic systolic CHF with AICD; afib on Eliquis; BPH; CAD; DM; HTN; HLD; hypothyroidism; and OSA presenting with L-sided weakness and gait disturbance x 9 days.  Admitted for further workup for presumed stroke.  Neurology consulted.  Assessment & Plan:   Principal Problem:   Acute ischemic stroke [facility]) Active Problems:   Essential hypertension   Tobacco abuse   Hypothyroidism   CAD (coronary artery disease)   ICD (implantable cardioverter-defibrillator) in place   Diabetes mellitus with complication (HCC)   PAF (paroxysmal atrial fibrillation) (HCC)   Left-sided weakness   Atrial fibrillation, chronic (HCC)   Anticoagulated   H/O medication noncompliance   CVA (cerebral vascular accident) ([place])   Subacute CVA - Present more than 9 days prior to [place] - MRI unobtainable given patient's AICD is not MRI compatible, CTA per neurology with no acute intracranial abnormalities. Severe stenosis of the right vertebral artery. - Continue Eliquis, aspirin, statin per neurology - Echo pending - PT/OT recommending inpatient rehab at this time   Afib, chronic, rate controlled - Continue amiodarone 200mg  qday for rate control - Continue Eliquis   HTN - Continue to follow, not currently on medication per med list *although it is yet to be confirmed   HLD - Continue Lipitor, markedly elevated triglycerides and VLDL with depressed HDL   DM, insulin-dependent, well-controlled - Continue sliding scale insulin, hypoglycemic protocol   Chronic systolic CHF/CAD - AICD which is NOT MRI compatible - Appears compensated - Continue ASA as above   Hypothyroidism - Continue Synthroid at current dose for now   OSA - Continue CPAP  Mood d/o -  Continue amitriptyline, Lexapro, gabapentin   Obesity - Hold Ozempic - Appears to be generally well controlled at this time   Tobacco dependence - Encourage cessation.   - This was discussed with the patient and should be reviewed on an ongoing basis.   - Patch ordered   DVT prophylaxis: Eliquis Code Status: Full Family Communication: Wife updated over the phone  Status is: Inpatient Dispo: The patient is from: Home              Anticipated d/c is to: Inpatient rehab              Anticipated d/c date is: Pending acceptance and insurance approval              Patient currently IS medically stable for discharge; awaiting safe disposition inpatient rehab  Consultants:  Neurology  Procedures:  None  Antimicrobials:  None indicated  Subjective: No acute issues or events overnight denies nausea vomiting diarrhea constipation headache fevers chills or chest pain  Objective: Vitals:   05/22/22 1924 05/22/22 1951 05/22/22 2323 05/23/22 0325  BP: (!) 159/147 113/76 134/73 103/68  Pulse: 87 86 82 95  Resp: 14  18 16   Temp: 97.9 F (36.6 C)  97.9 F (36.6 C) 98.5 F (36.9 C)  TempSrc: Oral  Oral Oral  SpO2: 100% 97% 95% 91%  Weight:      Height:        Intake/Output Summary (Last 24 hours) at 05/23/2022 0742 Last data filed at 05/22/2022 2140 Gross per 24 hour  Intake 120 ml  Output 400 ml  Net -280 ml    Filed  Weights   05/21/22 0526  Weight: 99.8 kg    Examination:  General:  Pleasantly resting in bed, No acute distress. HEENT:  Normocephalic atraumatic.  Sclerae nonicteric, noninjected. Neck: Without mass or deformity.  Trachea is midline. Lungs: Clear to auscultate bilaterally without rhonchi, wheeze, or rales. Heart:  Regular rate and rhythm.  Without murmurs, rubs, or gallops. Abdomen:  Soft, nontender, nondistended.  Without guarding or rebound. Extremities: Without cyanosis, clubbing, edema, or obvious deformity. Vascular:  Dorsalis pedis and posterior  tibial pulses palpable bilaterally. Skin:  Warm and dry, no erythema, no ulcerations.  Data Reviewed: I have personally reviewed following labs and imaging studies  CBC: Recent Labs  Lab 05/20/22 1934  WBC 7.0  NEUTROABS 4.2  HGB 15.0  HCT 44.9  MCV 88.6  PLT [id number]    Basic Metabolic Panel: Recent Labs  Lab 05/20/22 1934  NA 139  K 3.3*  CL 99  CO2 29  GLUCOSE 214*  BUN 10  CREATININE 1.32*  CALCIUM 9.3    GFR: Estimated Creatinine Clearance: 59.1 mL/min (A) (by C-G formula based on SCr of 1.32 mg/dL (H)). Liver Function Tests: Recent Labs  Lab 05/20/22 1934  AST 17  ALT 18  ALKPHOS 62  BILITOT 0.6  PROT 7.5  ALBUMIN 4.6    No results for input(s): "LIPASE", "AMYLASE" in the last 168 hours. No results for input(s): "AMMONIA" in the last 168 hours. Coagulation Profile: Recent Labs  Lab 05/20/22 1934  INR 1.1    Cardiac Enzymes: No results for input(s): "CKTOTAL", "CKMB", "CKMBINDEX", "TROPONINI" in the last 168 hours. BNP (last 3 results) No results for input(s): "PROBNP" in the last 8760 hours. HbA1C: Recent Labs    05/21/22 1802  HGBA1C 9.9*    CBG: Recent Labs  Lab 05/22/22 0614 05/22/22 1210 05/22/22 1642 05/22/22 2057 05/23/22 0629  GLUCAP 175* 241* 192* 199* 176*    Lipid Profile: Recent Labs    05/22/22 0241  CHOL 158  HDL 27*  LDLCALC 82  TRIG 247*  CHOLHDL 5.9    Thyroid Function Tests: No results for input(s): "TSH", "T4TOTAL", "FREET4", "T3FREE", "THYROIDAB" in the last 72 hours. Anemia Panel: No results for input(s): "VITAMINB12", "FOLATE", "FERRITIN", "TIBC", "IRON", "RETICCTPCT" in the last 72 hours. Sepsis Labs: No results for input(s): "PROCALCITON", "LATICACIDVEN" in the last 168 hours.  No results found for this or any previous visit (from the past 240 hour(s)).       Radiology Studies: ECHOCARDIOGRAM COMPLETE  Result Date: 05/21/2022    ECHOCARDIOGRAM REPORT   Patient Name:   Grant [name]  Date of Exam: 05/21/2022 Medical Rec #:  [id number]            Height:       74.0 in Accession #:    [id number]           Weight:       220.0 lb Date of Birth:  Jun 01, 1946            BSA:          2.263 m Patient Age:    76 years             BP:           158/92 mmHg Patient Gender: M                    HR:           69 bpm. Exam Location:  Inpatient Procedure: 2D  Echo, Cardiac Doppler, Color Doppler and Intracardiac            Opacification Agent Indications:    Stroke I63.9  History:        Patient has no prior history of Echocardiogram examinations.                 CAD, Stroke; Risk Factors:Hypertension, Current Smoker and                 Diabetes.  Sonographer:    Greer Pickerel Referring Phys: [id number] JENNIFER YATES  Sonographer Comments: Suboptimal parasternal window, suboptimal apical window, suboptimal subcostal window, no subcostal window and Technically difficult study due to poor echo windows. Image acquisition challenging due to patient body habitus and Image acquisition challenging due to respiratory motion. IMPRESSIONS  1. Extremely limited due to poor sound wave transmission; LV function appears to be preserved on definity images; suggest TEE or cardiac MRI to better evaluate if clinically indicated.  2. Left ventricular ejection fraction, by estimation, is 60 to 65%. The left ventricle has normal function. The left ventricle has no regional wall motion abnormalities. Left ventricular diastolic parameters are consistent with Grade I diastolic dysfunction (impaired relaxation).  3. Right ventricular systolic function was not well visualized. The right ventricular size is not well visualized.  4. The mitral valve was not well visualized. not well visualized mitral valve regurgitation.  5. Tricuspid valve regurgitation not well visualized.  6. The aortic valve is normal in structure. Aortic valve regurgitation Not well visualized.  7. Pulmonic valve regurgitation not well visualized.  8. Aortic dilatation  noted. There is mild dilatation of the aortic root, measuring 40 mm. FINDINGS  Left Ventricle: Left ventricular ejection fraction, by estimation, is 60 to 65%. The left ventricle has normal function. The left ventricle has no regional wall motion abnormalities. Definity contrast agent was given IV to delineate the left ventricular  endocardial borders. The left ventricular internal cavity size was normal in size. Suboptimal image quality limits for assessment of left ventricular hypertrophy. Left ventricular diastolic parameters are consistent with Grade I diastolic dysfunction (impaired relaxation). Right Ventricle: The right ventricular size is not well visualized. Right vetricular wall thickness was not well visualized. Right ventricular systolic function was not well visualized. Left Atrium: Left atrial size was not well visualized. Right Atrium: Right atrial size was not well visualized. Pericardium: There is no evidence of pericardial effusion. Mitral Valve: The mitral valve was not well visualized. Not well visualized mitral valve regurgitation. Tricuspid Valve: The tricuspid valve is grossly normal. Tricuspid valve regurgitation not well visualized. Aortic Valve: The aortic valve is normal in structure. Aortic valve regurgitation Not well visualized. Pulmonic Valve: The pulmonic valve was not well visualized. Pulmonic valve regurgitation not well visualized. Aorta: Aortic dilatation noted. There is mild dilatation of the aortic root, measuring 40 mm. IAS/Shunts: The interatrial septum was not well visualized. Additional Comments: Extremely limited due to poor sound wave transmission; LV function appears to be preserved on definity images; suggest TEE or cardiac MRI to better evaluate if clinically indicated. A device lead is visualized.  LEFT VENTRICLE PLAX 2D LVOT diam:     1.90 cm   Diastology LVOT Area:     2.84 cm  LV e' medial:    5.91 cm/s                          LV E/e' medial:  6.3  LV e' lateral:   6.37 cm/s                          LV E/e' lateral: 5.8  RIGHT VENTRICLE RV S prime:     14.80 cm/s TAPSE (M-mode): 2.2 cm LEFT ATRIUM           Index LA Vol (A4C): 48.4 ml 21.38 ml/m   AORTA Ao Root diam: 4.00 cm MITRAL VALVE MV Area (PHT): 5.31 cm    SHUNTS MV Decel Time: 143 msec    Systemic Diam: 1.90 cm MV E velocity: 37.20 cm/s MV A velocity: 57.60 cm/s MV E/A ratio:  0.65 Kirk Ruths MD Electronically signed by Kirk Ruths MD Signature Date/Time: 05/21/2022/5:24:27 PM    Final    CT ANGIO HEAD NECK W WO CM  Result Date: 05/21/2022 CLINICAL DATA:  Stroke suspected EXAM: CT ANGIOGRAPHY HEAD AND NECK TECHNIQUE: Multidetector CT imaging of the head and neck was performed using the standard protocol during bolus administration of intravenous contrast. Multiplanar CT image reconstructions and MIPs were obtained to evaluate the vascular anatomy. Carotid stenosis measurements (when applicable) are obtained utilizing NASCET criteria, using the distal internal carotid diameter as the denominator. RADIATION DOSE REDUCTION: This exam was performed according to the departmental dose-optimization program which includes automated exposure control, adjustment of the mA and/or kV according to patient size and/or use of iterative reconstruction technique. CONTRAST:  7mL OMNIPAQUE IOHEXOL 350 MG/ML SOLN COMPARISON:  CT Head 05/20/22 FINDINGS: CT HEAD FINDINGS Brain: Unchanged right thalamic small-vessel infarcts. No hemorrhage. No hydrocephalus. No extra-axial fluid collection. No CT evidence of new infarct. 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 Vascular: See below. There are dense atherosclerotic calcifications of the intracranial vasculature. Skull: Normal. Negative for fracture or focal lesion. Sinuses/Orbits: Air-fluid level in the left maxillary sinus. Other: None. Review of the MIP images confirms the above findings CTA NECK FINDINGS Aortic  arch: Standard branching. Imaged portion shows no evidence of aneurysm or dissection. No significant stenosis of the major arch vessel origins. Right carotid system: There is mild narrowing of the origin of the right SCA secondary to calcified atherosclerotic plaque. Left carotid system: No evidence of dissection, stenosis (50% or greater), or occlusion. Vertebral arteries: There is severe nearly occlusive stenosis at the origin of the right vertebral artery. There are additional scattered regions of mild-to-moderate atherosclerotic narrowing in the V3 and V4 segments of the bilateral vertebral arteries Skeleton: Negative. Other neck: Negative. Upper chest: Moderate centrilobular emphysema. Review of the MIP images confirms the above findings CTA HEAD FINDINGS Anterior circulation: No significant stenosis, proximal occlusion, aneurysm, or vascular malformation. Posterior circulation: No significant stenosis, proximal occlusion, aneurysm, or vascular malformation. Venous sinuses: As permitted by contrast timing, patent. Anatomic variants: None Review of the MIP images confirms the above findings IMPRESSION: 1. No acute intracranial abnormality. Unchanged age indeterminate right thalamic small-vessel infarct. 2. No intracranial large vessel occlusion. 3. Severe nearly occlusive stenosis of the origin of the right vertebral artery. Additional scattered regions of mild-to-moderate atherosclerotic narrowing in the V3 and V4 segments of the bilateral vertebral arteries. 4. Air-fluid level in the left maxillary sinus, correlate for symptoms of acute sinusitis. Emphysema (ICD10-J43.9). Electronically Signed   By: Marin Roberts M.D.   On: 05/21/2022 16:59    Scheduled Meds:  amiodarone  200 mg Oral Daily   amitriptyline  75 mg Oral QHS   apixaban  5 mg Oral BID   aspirin EC  81 mg Oral Daily   atorvastatin  40 mg Oral QHS   escitalopram  10 mg Oral QHS   fluticasone  2 spray Each Nare Daily   gabapentin  300 mg  Oral TID   insulin aspart  0-15 Units Subcutaneous TID WC   insulin glargine-yfgn  32 Units Subcutaneous QHS   levothyroxine  50 mcg Oral QAC breakfast   multivitamin with minerals  1 tablet Oral Daily   terazosin  5 mg Oral BID    LOS: 2 days   Time spent:  Azucena Fallen, DO [facility]  If 7PM-7AM, please contact night-coverage [email or web address]  05/23/2022, 7:42 AM

## 2022-05-24 DIAGNOSIS — I639 Cerebral infarction, unspecified: Secondary | ICD-10-CM | POA: Diagnosis not present

## 2022-05-24 LAB — URINE CULTURE: Culture: NO GROWTH

## 2022-05-24 LAB — GLUCOSE, CAPILLARY
Glucose-Capillary: 179 mg/dL — ABNORMAL HIGH (ref 70–99)
Glucose-Capillary: 282 mg/dL — ABNORMAL HIGH (ref 70–99)
Glucose-Capillary: 166 mg/dL — ABNORMAL HIGH (ref 70–99)
Glucose-Capillary: 148 mg/dL — ABNORMAL HIGH (ref 70–99)

## 2022-05-24 NOTE — Progress Notes (Signed)
PROGRESS NOTE    Grant [name]  [id number] DOB: 1946/03/14 DOA: 05/20/2022 PCP: [facility]   Brief Narrative:  Grant [name] is a 76 y.o. male with medical history significant of chronic systolic CHF with AICD; afib on Eliquis; BPH; CAD; DM; HTN; HLD; hypothyroidism; and OSA presenting with L-sided weakness and gait disturbance x 9 days.  Admitted for further workup for presumed stroke.  Neurology consulted.  Patient symptoms continue to improve but his ambulatory dysfunction is unfortunately ongoing, unsafe to discharge home, PT currently recommending transfer to [facility] for ongoing inpatient rehabilitation with ultimate discharge plan for discharge home with wife.  He is otherwise medically stable for discharge.  Assessment & Plan:   Principal Problem:   Acute ischemic stroke [facility]) Active Problems:   Essential hypertension   Tobacco abuse   Hypothyroidism   CAD (coronary artery disease)   ICD (implantable cardioverter-defibrillator) in place   Diabetes mellitus with complication (HCC)   PAF (paroxysmal atrial fibrillation) (HCC)   Left-sided weakness   Atrial fibrillation, chronic (HCC)   Anticoagulated   H/O medication noncompliance   CVA (cerebral vascular accident) ([place])   Subacute CVA - Present more than 9 days prior to [place] - MRI unobtainable given patient's AICD is not MRI compatible, CTA per neurology with no acute intracranial abnormalities. Severe stenosis of the right vertebral artery. - Continue Eliquis, aspirin, statin per neurology - Echo pending - PT/OT recommending inpatient rehab at this time   Afib, chronic, rate controlled - Continue amiodarone 200mg  qday for rate control - Continue Eliquis   HTN - Continue to follow, not currently on medication per med list *although it is yet to be confirmed   HLD - Continue Lipitor, markedly elevated triglycerides and VLDL with depressed HDL   DM,  insulin-dependent, well-controlled - Continue sliding scale insulin, hypoglycemic protocol   Chronic systolic CHF/CAD - AICD which is NOT MRI compatible - Appears compensated - Continue ASA as above   Hypothyroidism - Continue Synthroid at current dose for now   OSA - Continue CPAP  Mood d/o - Continue amitriptyline, Lexapro, gabapentin   Obesity - Hold Ozempic - Appears to be generally well controlled at this time   Tobacco dependence - Encourage cessation.   - This was discussed with the patient and should be reviewed on an ongoing basis.   - Patch ordered   DVT prophylaxis: Eliquis Code Status: Full Family Communication: Wife updated over the phone  Status is: Inpatient Dispo: The patient is from: Home              Anticipated d/c is to: Inpatient rehab              Anticipated d/c date is: Pending acceptance and insurance approval              Patient currently IS medically stable for discharge; awaiting safe disposition inpatient rehab  Consultants:  Neurology  Procedures:  None  Antimicrobials:  None indicated  Subjective: No acute issues or events overnight denies nausea vomiting diarrhea constipation headache fevers chills or chest pain  Objective: Vitals:   05/23/22 1800 05/23/22 2200 05/24/22 0033 05/24/22 0402  BP:  101/66 102/65 110/77  Pulse:  (!) 109 90 80  Resp:  20 16 18   Temp: (!) 100.6 F (38.1 C) 99.5 F (37.5 C) 98.8 F (37.1 C) 98.9 F (37.2 C)  TempSrc: Oral Oral Oral   SpO2:  98% 93% 95%  Weight:      Height:        Intake/Output Summary (Last 24 hours) at 05/24/2022 0738 Last data filed at 05/23/2022 1745 Gross per 24 hour  Intake 480 ml  Output 100 ml  Net 380 ml    Filed Weights   05/21/22 0526  Weight: 99.8 kg    Examination:  General:  Pleasantly resting in bed, No acute distress. HEENT:  Normocephalic atraumatic.  Sclerae nonicteric, noninjected. Neck: Without mass or deformity.  Trachea is midline. Lungs:  Clear to auscultate bilaterally without rhonchi, wheeze, or rales. Heart:  Regular rate and rhythm.  Without murmurs, rubs, or gallops. Abdomen:  Soft, nontender, nondistended.  Without guarding or rebound. Extremities: Without cyanosis, clubbing, edema, or obvious deformity. Vascular:  Dorsalis pedis and posterior tibial pulses palpable bilaterally. Skin:  Warm and dry, no erythema, no ulcerations.  Data Reviewed: I have personally reviewed following labs and imaging studies  CBC: Recent Labs  Lab 05/20/22 1934 05/23/22 1526  WBC 7.0 8.9  NEUTROABS 4.2  --   HGB 15.0 14.1  HCT 44.9 41.1  MCV 88.6 88.0  PLT 171 107*    Basic Metabolic Panel: Recent Labs  Lab 05/20/22 1934 05/23/22 1526  NA 139 138  K 3.3* 3.0*  CL 99 103  CO2 29 23  GLUCOSE 214* 190*  BUN 10 9  CREATININE 1.32* 1.48*  CALCIUM 9.3 8.9    GFR: Estimated Creatinine Clearance: 52.7 mL/min (A) (by C-G formula based on SCr of 1.48 mg/dL (H)). Liver Function Tests: Recent Labs  Lab 05/20/22 1934  AST 17  ALT 18  ALKPHOS 62  BILITOT 0.6  PROT 7.5  ALBUMIN 4.6    No results for input(s): "LIPASE", "AMYLASE" in the last 168 hours. No results for input(s): "AMMONIA" in the last 168 hours. Coagulation Profile: Recent Labs  Lab 05/20/22 1934  INR 1.1    Cardiac Enzymes: No results for input(s): "CKTOTAL", "CKMB", "CKMBINDEX", "TROPONINI" in the last 168 hours. BNP (last 3 results) No results for input(s): "PROBNP" in the last 8760 hours. HbA1C: Recent Labs    05/21/22 1802  HGBA1C 9.9*    CBG: Recent Labs  Lab 05/23/22 0629 05/23/22 1150 05/23/22 1633 05/23/22 2146 05/24/22 0642  GLUCAP 176* 208* 210* 224* 179*    Lipid Profile: Recent Labs    05/22/22 0241  CHOL 158  HDL 27*  LDLCALC 82  TRIG 160*  CHOLHDL 5.9    Thyroid Function Tests: No results for input(s): "TSH", "T4TOTAL", "FREET4", "T3FREE", "THYROIDAB" in the last 72 hours. Anemia Panel: No results for  input(s): "VITAMINB12", "FOLATE", "FERRITIN", "TIBC", "IRON", "RETICCTPCT" in the last 72 hours. Sepsis Labs: No results for input(s): "PROCALCITON", "LATICACIDVEN" in the last 168 hours.  No results found for this or any previous visit (from the past 240 hour(s)).       Radiology Studies: DG CHEST PORT 1 VIEW  Result Date: 05/23/2022 CLINICAL DATA:  Hypoxia EXAM: PORTABLE CHEST 1 VIEW COMPARISON:  03/08/2017 FINDINGS: Cardiomediastinal silhouette is unchanged. LEFT subclavian pacemaker/AICD again noted. There is no evidence of focal airspace disease, pulmonary edema, suspicious pulmonary nodule/mass, pleural effusion, or pneumothorax. No acute bony abnormalities are identified. IMPRESSION: No active disease. Electronically Signed   By: Harmon Pier M.D.   On: 05/23/2022 16:54    Scheduled Meds:  amiodarone  200 mg Oral Daily   amitriptyline  75 mg Oral QHS   apixaban  5 mg Oral BID   aspirin EC  81 mg  Oral Daily   atorvastatin  40 mg Oral QHS   escitalopram  10 mg Oral QHS   fluticasone  2 spray Each Nare Daily   gabapentin  300 mg Oral TID   insulin aspart  0-15 Units Subcutaneous TID WC   insulin glargine-yfgn  32 Units Subcutaneous QHS   levothyroxine  50 mcg Oral QAC breakfast   magnesium citrate  1 Bottle Oral Once   multivitamin with minerals  1 tablet Oral Daily   polyethylene glycol  17 g Oral Daily   terazosin  5 mg Oral BID    LOS: 3 days   Time spent: 56min  Grant Hunger C Malakye Nolden, DO [facility]  If 7PM-7AM, please contact night-coverage [email or web address]  05/24/2022, 7:38 AM

## 2022-05-24 NOTE — Inpatient Diabetes Management (Signed)
Inpatient Diabetes Program Recommendations  AACE/ADA: New Consensus Statement on Inpatient Glycemic Control (2015)  Target Ranges:  Prepandial:   less than 140 mg/dL      Peak postprandial:   less than 180 mg/dL (1-2 hours)      Critically ill patients:  140 - 180 mg/dL    Latest Reference Range & Units 05/21/22 18:02  Hemoglobin A1C 4.8 - 5.6 % 9.9 (H)  237 mg/dl  (H): Data is abnormally high  Latest Reference Range & Units 05/23/22 06:29 05/23/22 11:50 05/23/22 16:33 05/23/22 21:46  Glucose-Capillary 70 - 99 mg/dL 009 (H)  3 units Novolog  208 (H)  5 units Novolog  210 (H)  5 units Novolog  224 (H)    32 units Semglee  (H): Data is abnormally high  Latest Reference Range & Units 05/24/22 06:42  Glucose-Capillary 70 - 99 mg/dL 233 (H)  3 units Novolog   (H): Data is abnormally high   Admit with: Acute ischemic stroke   History: DM, CHF  Home DM Meds: Novolog 0-15 units TID with meals       Lantus 32 units Daily       Ozempic 1 mg Qweek  Current Orders: Semglee 32 units QHS      Novolog Moderate Correction Scale/ SSI (0-15 units) TID AC   MD- Please consider:  1. Increase Semglee slightly to 34 units QHS  2. Start Novolog Meal Coverage: Novolog 4 units TID with meals HOLD if pt NPO HOLD if pt eats <50% meals    --Will follow patient during hospitalization--  Ambrose Finland RN, MSN, CDCES Diabetes Coordinator Inpatient Glycemic Control Team Team Pager: [phone] (8a-5p)

## 2022-05-24 NOTE — Progress Notes (Signed)
Occupational Therapy Treatment Patient Details Name: Grant [name] MRN: [id number] DOB: [birth year] Today's Date: 05/24/2022   History of present illness Pt is 76 yo male admitted on 11/14/21 with L sided weakness for 9 days but was out of town so waited to come to hospital until returned home.  CT was negative for acute stroke and pt unable to have MRI.  Pt reports a fall onto his LT shoulder and hand ~2 months ago with LT wrist and LT shoulder pain since that time. He denies getting any x-rays: "I don't like to go to the doctor." Pt with hx of afib, AICD, dm, CAD, HLD.   OT comments  Pt making slow but steady progress toward OT goals. Pt did not get up over the weekend therefore presented with some weakness during session getting OOB. Pt required mod assist for all mobility and was unable to walk with +1 assist today due to posterior lean and poor balance. Pt required overall mod assist with most adls due to poor balance, decreased functional use of LUE, weakness and significantly impaired insight into his deficits and need for assist which makes him a fall risk. Pt was falling "almost daily" per his report prior to coming to hospital so feel pt may benefit from AIR to increase independence with basic adls and balance so wife can handle pt at home.     Recommendations for follow up therapy are one component of a multi-disciplinary discharge planning process, led by the attending physician.  Recommendations may be updated based on patient status, additional functional criteria and insurance authorization.    Follow Up Recommendations  Acute inpatient rehab (3hours/day)     Assistance Recommended at Discharge Frequent or constant Supervision/Assistance  Patient can return home with the following  Two people to help with walking and/or transfers;A lot of help with walking and/or transfers;A little help with bathing/dressing/bathroom   Equipment Recommendations  BSC/3in1    Recommendations  for Other Services Rehab consult    Precautions / Restrictions Precautions Precautions: Fall Precaution Comments: LT wrist pain with WB (fall 2 months ago on hand. No imaging) Restrictions Weight Bearing Restrictions: No       Mobility Bed Mobility Overal bed mobility: Needs Assistance Bed Mobility: Supine to Sit     Supine to sit: HOB elevated, Min assist     General bed mobility comments: Assist to scoot to EOB. Once this motion started, pt was able to continue without assist.    Transfers Overall transfer level: Needs assistance Equipment used: Rolling walker (2 wheels) Transfers: Sit to/from Stand, Bed to chair/wheelchair/BSC Sit to Stand: Mod assist Stand pivot transfers: Mod assist         General transfer comment: Pt mod assist to get to standing today.  Pt completed 3 more repetitions with last one from elevated surface with min assist.     Balance Overall balance assessment: History of Falls, Needs assistance (daily falls) Sitting-balance support: Single extremity supported Sitting balance-Leahy Scale: Fair Sitting balance - Comments: Posterior LOB when raises UEs (Pt has hard time keeping L foot on the floor due to proprioceptive deficits.) Postural control: Posterior lean Standing balance support: Bilateral upper extremity supported, During functional activity, Reliant on assistive device for balance Standing balance-Leahy Scale: Poor Standing balance comment: Pt unable to stand without both hands on walker this am.                           ADL  either performed or assessed with clinical judgement   ADL Overall ADL's : Needs assistance/impaired Eating/Feeding: Set up;Sitting Eating/Feeding Details (indicate cue type and reason): Attempted to have pt cut own food but unable to use L hand effectively. Pt with decreased coordination, strength and proprioception of the LUE. Grooming: Wash/dry hands;Wash/dry face;Set up;Sitting Grooming Details  (indicate cue type and reason): Pt sat on EOB with supervision to complete basic grooming tasks. Upper Body Bathing: Moderate assistance;Sitting;Cueing for sequencing;Cueing for compensatory techniques Upper Body Bathing Details (indicate cue type and reason): Pt very uncoordinated with LUE making bathing difficult.  Pt drops items regularly with LUE.             Toilet Transfer: Moderate assistance;Rolling walker (2 wheels);BSC/3in1;Ambulation Toilet Transfer Details (indicate cue type and reason): Pt required mod assist to shift weight to balls of feet when first up and continued mod assist  to transfer to chair. Pt with posterior lean during transfer. If therapist not there, pt would have fallen posteriorly.  Pt with little insight to this posterior lean which makes pt a fall risk. Toileting- Clothing Manipulation and Hygiene: Moderate assistance;Sit to/from stand;Sitting/lateral lean       Functional mobility during ADLs: Moderate assistance;Rolling walker (2 wheels) General ADL Comments: Would need +2 if pt moved any further than from bed to chair. Pt did not get up all weekend and was weak getting to his feet and felt unstable in standing.    Extremity/Trunk Assessment Upper Extremity Assessment Upper Extremity Assessment: LUE deficits/detail RUE Coordination: decreased fine motor LUE Deficits / Details: L UE with weakness, decreased sensation, decreased coordination. Has very weak compostire grip ~3-/5, Has wrist and LT soulder pain with limited shoulder ROM to ~70 degrees and wrist ROM WFL but slow to move and painful with weight bearing when scooting to EOB. LUE: Unable to fully assess due to pain LUE Sensation: decreased light touch LUE Coordination: decreased fine motor;decreased gross motor   Lower Extremity Assessment Lower Extremity Assessment: Defer to PT evaluation        Vision   Vision Assessment?: No apparent visual deficits Additional Comments: Pt wears glasses  and is able to track in all quadrants. Pt does not neglect L environment but does negect is L side of his body.  Pt can read clock and found all items in all areas of room when asked.   Perception     Praxis      Cognition Arousal/Alertness: Awake/alert Behavior During Therapy: WFL for tasks assessed/performed Overall Cognitive Status: Impaired/Different from baseline Area of Impairment: Awareness, Following commands, Safety/judgement, Problem solving                       Following Commands: Follows multi-step commands inconsistently, Follows multi-step commands with increased time Safety/Judgement: Decreased awareness of safety, Decreased awareness of deficits Awareness: Intellectual Problem Solving: Difficulty sequencing, Requires verbal cues, Requires tactile cues General Comments: Pt requires a significant amount of assist and verbalizes this is true as the task is happening but when asked if it is safe to get back in bed alone, pt states yes! I should be fine.  Pt with little insight into the severity of his deficits which makes going straight home a safety concern.        Exercises      Shoulder Instructions       General Comments Pt did not get up over the weekend and did present weaker with mobility today. Pt very limited by decreased  coordination and proprioception in the L side. Feel pt will need more assist at home than his wife can offer without further rehab.    Pertinent Vitals/ Pain       Pain Assessment Pain Assessment: 0-10 Pain Score: 4  Faces Pain Scale: Hurts little more Pain Location: L shoulder and LT wrist. Pain Descriptors / Indicators: Discomfort, Grimacing, Guarding Pain Intervention(s): Limited activity within patient's tolerance, Monitored during session, Repositioned  Home Living                                          Prior Functioning/Environment              Frequency  Min 2X/week        Progress  Toward Goals  OT Goals(current goals can now be found in the care plan section)  Progress towards OT goals: Progressing toward goals  Acute Rehab OT Goals Patient Stated Goal: to go home OT Goal Formulation: With family Time For Goal Achievement: 06/05/22 Potential to Achieve Goals: Good ADL Goals Pt Will Perform Grooming: standing;with supervision Pt Will Perform Lower Body Dressing: with supervision;with adaptive equipment;sit to/from stand;sitting/lateral leans Pt Will Transfer to Toilet: with supervision;ambulating;bedside commode Pt Will Perform Toileting - Clothing Manipulation and hygiene: with supervision;sitting/lateral leans;sit to/from stand Pt Will Perform Tub/Shower Transfer: with min guard assist;shower seat;ambulating;with caregiver independent in assisting;Shower transfer Pt/caregiver will Perform Home Exercise Program: Increased ROM;Increased strength;Both right and left upper extremity;With Supervision;With minimal assist  Plan Discharge plan remains appropriate    Co-evaluation                 AM-PAC OT "6 Clicks" Daily Activity     Outcome Measure   Help from another person eating meals?: A Little Help from another person taking care of personal grooming?: A Little Help from another person toileting, which includes using toliet, bedpan, or urinal?: A Lot Help from another person bathing (including washing, rinsing, drying)?: A Lot Help from another person to put on and taking off regular upper body clothing?: A Little Help from another person to put on and taking off regular lower body clothing?: A Lot 6 Click Score: 15    End of Session Equipment Utilized During Treatment: Gait belt;Rolling walker (2 wheels)  OT Visit Diagnosis: Unsteadiness on feet (R26.81);Repeated falls (R29.6);Muscle weakness (generalized) (M62.81);History of falling (Z91.81);Pain;Hemiplegia and hemiparesis Hemiplegia - Right/Left: Left Hemiplegia - dominant/non-dominant:  Non-Dominant Hemiplegia - caused by: Cerebral infarction Pain - Right/Left: Left Pain - part of body: Shoulder;Hand;Arm   Activity Tolerance Patient tolerated treatment well   Patient Left in chair;with call bell/phone within reach;with chair alarm set;with family/visitor present   Nurse Communication Mobility status        Time: [id number] OT Time Calculation (min): 26 min  Charges: OT General Charges $OT Visit: 1 Visit OT Treatments $Self Care/Home Management : 23-37 mins   Glenford Peers 05/24/2022, 11:51 AM

## 2022-05-24 NOTE — PMR Pre-admission (Signed)
PMR Admission Coordinator Pre-Admission Assessment  Patient: Grant [name] is an 76 y.o., male MRN: [id number] DOB: [birth year] Height: _0  (188 cm) Weight: 99.8 kg  Insurance Information HMO:     PPO:      PCP:      IPA:      80/20:      OTHER:  PRIMARY: [place]      Policy#: [id number]      Subscriber: pt CM Name: Sherlynn Carbon      Phone#: [phone] or email Cassie .Hansen _1 Beverlee Nims     Fax#: [phone] or use email Pre-Cert#: [id number]      Employer:  Benefits:  Phone #: [phone]     Name: 12/4 Eff. Date: 11/13/17     Deduct: none      Out of Pocket Max: none [facility] per [facility]      S[facility]: per [facility] Outpatient: per [facility]     Co-Pay:  Home Health: per [facility]      Co-Pay:  DME: per [facility]    Co-Pay:  Providers: in network  SECONDARY: none      Policy#:      Phone#:   Development worker, community:       Phone#:   The Engineer, petroleum" for patients in Inpatient Rehabilitation Facilities with attached "Privacy Act [place] Records" was provided and verbally reviewed with: Patient and Family  Emergency Contact Information Contact Information     Name Relation Home Work Mobile   Crawford-Seely,Betty Spouse [phone]  [phone]      Current Medical History  Patient Admitting Diagnosis: CVA  History of Present Illness: 76 year old male with history of DM, CAD, HLD , chronic systolic CHF, mood disorder, afib on Eliquis, hypothyroidism and OSA who was out of town in [place] when he began having left sided numbness and weakness on left side. He did not seek medical care until returned to [facility] . Presented on 05/20/22.  MRI unobtainable given AICD not MRI compatible. CTA per Neurology with no acute intracranial abnormalities. Severe stenosis of the right vertebral artery.  Felt to be subacute Right MCA ischemic infarct likely due to Afib on Eliquis. Neurology recommend continue Eliquis, ASA and Statin. 2 d echo extremely limited with EF 60 to 65%. Home meds of  Losartan for HTN. LDL 82 with home Lipitor resumed. DM type 2 with Hgb A1c 9.9. Home meds of insulin and now SSI. Continue on amiodarone for rate control for A fib. Continue amitriptyline, Lexapro and gabapentin for mood disorder. Synthroid for hypothyroidism.   Complete NIHSS TOTAL: 5  Patient's medical record from [facility] has been reviewed by the rehabilitation admission coordinator and physician.  Past Medical History  Past Medical History:  Diagnosis Date   AICD (automatic cardioverter/defibrillator) present    BPH (benign prostatic hyperplasia)    CAD (coronary artery disease)    DM (diabetes mellitus) ([place])    HLD (hyperlipidemia)    Hyperglycemia 03/09/2017   Hypothyroidism    NSVT (nonsustained ventricular tachycardia) (HCC)    Obstructive sleep apnea    Has the patient had major surgery during 100 days prior to admission? No  Family History   family history includes CAD in his mother; Cancer - Colon in his brother and father; Hypertension in his mother.  Current Medications  Current Facility-Administered Medications:    acetaminophen (TYLENOL) tablet 650 mg, 650 mg, Oral, Q4H PRN, 650 mg at 05/23/22 1702 **OR** acetaminophen (TYLENOL) 160 MG/5ML solution 650 mg, 650  mg, Per Tube, Q4H PRN **OR** acetaminophen (TYLENOL) suppository 650 mg, 650 mg, Rectal, Q4H PRN, Karmen Bongo, MD   amiodarone (PACERONE) tablet 200 mg, 200 mg, Oral, Daily, Little Ishikawa, MD, 200 mg at 05/26/22 1034   amitriptyline (ELAVIL) tablet 75 mg, 75 mg, Oral, QHS, Karmen Bongo, MD, 75 mg at 05/25/22 2218   apixaban (ELIQUIS) tablet 5 mg, 5 mg, Oral, BID, Shafer, Devon, NP, 5 mg at 05/26/22 1034   aspirin EC tablet 81 mg, 81 mg, Oral, Daily, Karmen Bongo, MD, 81 mg at 05/26/22 1037   atorvastatin (LIPITOR) tablet 40 mg, 40 mg, Oral, QHS, Karmen Bongo, MD, 40 mg at 05/25/22 2217   cyclobenzaprine (FLEXERIL) tablet 10 mg, 10 mg, Oral, QHS PRN, Karmen Bongo, MD    escitalopram (LEXAPRO) tablet 10 mg, 10 mg, Oral, QHS, Karmen Bongo, MD, 10 mg at 05/25/22 2217   fluticasone (FLONASE) 50 MCG/ACT nasal spray 2 spray, 2 spray, Each Nare, Daily, Karmen Bongo, MD, 2 spray at 05/26/22 1035   furosemide (LASIX) tablet 20 mg, 20 mg, Oral, Daily, Bonnielee Haff, MD   gabapentin (NEURONTIN) capsule 300 mg, 300 mg, Oral, TID, Karmen Bongo, MD, 300 mg at 05/26/22 1034   insulin aspart (novoLOG) injection 0-15 Units, 0-15 Units, Subcutaneous, TID WC, Karmen Bongo, MD, 3 Units at 05/26/22 0629   insulin glargine-yfgn (SEMGLEE) injection 32 Units, 32 Units, Subcutaneous, Ivery Quale, MD, 32 Units at 05/25/22 2220   levothyroxine (SYNTHROID) tablet 50 mcg, 50 mcg, Oral, QAC breakfast, Karmen Bongo, MD, 50 mcg at 05/26/22 0350   magnesium citrate solution 1 Bottle, 1 Bottle, Oral, Once, Little Ishikawa, MD   multivitamin with minerals tablet 1 tablet, 1 tablet, Oral, Daily, Karmen Bongo, MD, 1 tablet at 05/26/22 1034   polyethylene glycol (MIRALAX / GLYCOLAX) packet 17 g, 17 g, Oral, Daily, Little Ishikawa, MD, 17 g at 05/25/22 1149   senna-docusate (Senokot-S) tablet 1 tablet, 1 tablet, Oral, QHS PRN, Karmen Bongo, MD, 1 tablet at 05/23/22 1017   terazosin (HYTRIN) capsule 5 mg, 5 mg, Oral, BID, Karmen Bongo, MD, 5 mg at 05/26/22 1034  Patients Current Diet:  Diet Order             Diet Carb Modified Fluid consistency: Thin; Room service appropriate? Yes with Assist  Diet effective now                  Precautions / Restrictions Precautions Precautions: Fall Precaution Comments: LT wrist pain with WB (fall 2 months ago on hand. No imaging) Restrictions Weight Bearing Restrictions: No   Has the patient had 2 or more falls or a fall with injury in the past year? No  Prior Activity Level Community (5-7x/wk): Mod I with cane in community, did own adls  Prior Functional Level Self Care: Did the patient need help  bathing, dressing, using the toilet or eating? Independent  Indoor Mobility: Did the patient need assistance with walking from room to room (with or without device)? Independent  Stairs: Did the patient need assistance with internal or external stairs (with or without device)? Independent  Functional Cognition: Did the patient need help planning regular tasks such as shopping or remembering to take medications? Independent  Patient Information Are you of Hispanic, Latino/a,or Spanish origin?: A. No, not of Hispanic, Latino/a, or Spanish origin What is your race?: B. Black or African American Do you need or want an interpreter to communicate with a doctor or health care staff?: 0. No  Patient's Response To:  Health Literacy and Transportation Is the patient able to respond to health literacy and transportation needs?: Yes Health Literacy - How often do you need to have someone help you when you read instructions, pamphlets, or other written material from your doctor or pharmacy?: Never In the past 12 months, has lack of transportation kept you from medical appointments or from getting medications?: No In the past 12 months, has lack of transportation kept you from meetings, work, or from getting things needed for daily living?: No  Development worker, international aid / [place] Devices/Equipment: CBG Meter, Radio producer (specify quad or straight), Eyeglasses Home Equipment: Grab bars - tub/shower, Cane - single point  Prior Device Use: Indicate devices/aids used by the patient prior to current illness, exacerbation or injury?  cane  Current Functional Level Cognition  Arousal/Alertness: Awake/alert Overall Cognitive Status: Impaired/Different from baseline Orientation Level: Oriented X4 Following Commands: Follows multi-step commands inconsistently, Follows multi-step commands with increased time Safety/Judgement: Decreased awareness of safety, Decreased awareness of deficits General  Comments: Pt with little insight into the severity of his deficits which makes going straight home a safety concern. Attention: Selective Selective Attention: Impaired Selective Attention Impairment: Verbal complex Memory: Impaired Memory Impairment: Decreased recall of new information Problem Solving: Appears intact    Extremity Assessment (includes Sensation/Coordination)  Upper Extremity Assessment: LUE deficits/detail RUE Coordination: decreased fine motor LUE Deficits / Details: L UE with weakness, decreased sensation, decreased coordination. Has very weak compostire grip ~3-/5, Has wrist and LT soulder pain with limited shoulder ROM to ~70 degrees and wrist ROM WFL but slow to move and painful with weight bearing when scooting to EOB. LUE: Unable to fully assess due to pain LUE Sensation: decreased light touch LUE Coordination: decreased fine motor, decreased gross motor  Lower Extremity Assessment: Defer to PT evaluation RLE Deficits / Details: ROM WFL ; MMT 5/5 LLE Deficits / Details: ROM WFL; MMT: ankle 4/5, knee ext 4/5 and painful in hamstrings, hip 3/5 LLE Sensation: decreased light touch LLE Coordination: decreased gross motor, decreased fine motor    ADLs  Overall ADL's : Needs assistance/impaired Eating/Feeding: Set up, Sitting Eating/Feeding Details (indicate cue type and reason): Attempted to have pt cut own food but unable to use L hand effectively. Pt with decreased coordination, strength and proprioception of the LUE. Grooming: Wash/dry hands, Wash/dry face, Set up, Sitting Grooming Details (indicate cue type and reason): Pt sat on EOB with supervision to complete basic grooming tasks. Upper Body Bathing: Moderate assistance, Sitting, Cueing for sequencing, Cueing for compensatory techniques Upper Body Bathing Details (indicate cue type and reason): Pt very uncoordinated with LUE making bathing difficult.  Pt drops items regularly with LUE. Lower Body Bathing:  Moderate assistance, Sit to/from stand, Sitting/lateral leans, +2 for safety/equipment, Cueing for safety Upper Body Dressing : Minimal assistance, Sitting, Cueing for sequencing Lower Body Dressing: Moderate assistance, Maximal assistance, +2 for safety/equipment, Cueing for safety, Cueing for sequencing, Sit to/from stand, Sitting/lateral leans Lower Body Dressing Details (indicate cue type and reason): Moderate assist sitting. Maximum assist standing. Toilet Transfer: Moderate assistance, Rolling walker (2 wheels), BSC/3in1, Ambulation Toilet Transfer Details (indicate cue type and reason): Pt required mod assist to shift weight to balls of feet when first up and continued mod assist  to transfer to chair. Pt with posterior lean during transfer. If therapist not there, pt would have fallen posteriorly.  Pt with little insight to this posterior lean which makes pt a fall risk. Toileting- Water quality scientist  and Hygiene: Moderate assistance, Sit to/from stand, Sitting/lateral lean Toileting - Clothing Manipulation Details (indicate cue type and reason): Based on general assessment. Functional mobility during ADLs: Moderate assistance, Rolling walker (2 wheels) General ADL Comments: Would need +2 if pt moved any further than from bed to chair. Pt did not get up all weekend and was weak getting to his feet and felt unstable in standing.    Mobility  Overal bed mobility: Needs Assistance Bed Mobility: Supine to Sit Supine to sit: HOB elevated, Min assist Sit to supine: HOB elevated, Min guard General bed mobility comments: pt OOB in recliner pre and post session    Transfers  Overall transfer level: Needs assistance Equipment used: Rolling walker (2 wheels) Transfers: Sit to/from Stand, Bed to chair/wheelchair/BSC Sit to Stand: Mod assist Bed to/from chair/wheelchair/BSC transfer type:: Stand pivot Stand pivot transfers: Mod assist General transfer comment: Pt mod assist to get to standing  today.  able to repeat x5 reps throughout session    Ambulation / Gait / Stairs / Wheelchair Mobility  Ambulation/Gait Ambulation/Gait assistance: Mod assist Gait Distance (Feet): 10 Feet Assistive device: Rolling walker (2 wheels) Gait Pattern/deviations: Decreased stride length, Decreased stance time - left, Step-through pattern, Step-to pattern, Decreased step length - left, Decreased weight shift to right General Gait Details: gait deferred as pt unable to maintain standing balance this session needing max cues for anterior weight shift, pt blocking backs of legs on reclienr to maintain standing Gait velocity: decreased    Posture / Balance Dynamic Sitting Balance Sitting balance - Comments: Posterior LOB when raises UEs (Pt has hard time keeping L foot on the floor due to proprioceptive deficits.) Balance Overall balance assessment: History of Falls, Needs assistance (daily falls) Sitting-balance support: Single extremity supported Sitting balance-Leahy Scale: Fair Sitting balance - Comments: Posterior LOB when raises UEs (Pt has hard time keeping L foot on the floor due to proprioceptive deficits.) Postural control: Posterior lean Standing balance support: Bilateral upper extremity supported, During functional activity, Reliant on assistive device for balance Standing balance-Leahy Scale: Poor Standing balance comment: Pt unable to maintain standing balance without mod assist    Special needs/care consideration Hgb A1c 9.9   Previous Home Environment  Living Arrangements: Spouse/significant other  Lives With: Spouse Available Help at Discharge: Family, Available 24 hours/day Type of Home: House Home Layout: One level Home Access: Stairs to enter Entrance Stairs-Rails: Right Entrance Stairs-Number of Steps: 2 Bathroom Shower/Tub: Multimedia programmer: Standard Bathroom Accessibility: Yes How Accessible: Accessible via walker Home Care Services: No  Discharge  Living Setting Plans for Discharge Living Setting: Patient's home, Lives with (comment) (wife) Type of Home at Discharge: House Discharge Home Layout: One level Discharge Home Access: Stairs to enter Entrance Stairs-Rails: Right Entrance Stairs-Number of Steps: 2 Discharge Bathroom Shower/Tub: Walk-in shower Discharge Bathroom Toilet: Standard Discharge Bathroom Accessibility: Yes How Accessible: Accessible via walker Does the patient have any problems obtaining your medications?: No  Social/Family/Support Systems Patient Roles: Spouse Contact Information: wife, Inez Catalina Anticipated Caregiver: wife Anticipated Ambulance person Information: see contacts Ability/Limitations of Caregiver: supervision to min assist Caregiver Availability: 24/7 Discharge Plan Discussed with Primary Caregiver: Yes Is Caregiver In Agreement with Plan?: Yes Does Caregiver/Family have Issues with Lodging/Transportation while Pt is in Rehab?: No  Goals Patient/Family Goal for Rehab: Mod I to supervision with PT and OT, supervision with SLP Expected length of stay: ELOS 10 to 14 days Pt/Family Agrees to Admission and willing to participate: Yes Program Orientation Provided &  Reviewed with Pt/Caregiver Including Roles  & Responsibilities: Yes  Decrease burden of Care through IP rehab admission: n/a  Possible need for SNF placement upon discharge: not anticipated  Patient Condition: I have reviewed medical records from [facility], spoken with CM, and patient and spouse. I met with patient at the bedside for inpatient rehabilitation assessment.  Patient will benefit from ongoing PT, OT, and SLP, can actively participate in 3 hours of therapy a day 5 days of the week, and can make measurable gains during the admission.  Patient will also benefit from the coordinated team approach during an Inpatient Acute Rehabilitation admission.  The patient will receive intensive therapy as well as Rehabilitation  physician, nursing, social worker, and care management interventions.  Due to bladder management, bowel management, safety, skin/wound care, disease management, medication administration, pain management, and patient education the patient requires 24 hour a day rehabilitation nursing.  The patient is currently mod assist overall with mobility and basic ADLs.  Discharge setting and therapy post discharge at home with home health is anticipated.  Patient has agreed to participate in the Acute Inpatient Rehabilitation Program and will admit today.  Preadmission Screen Completed By:  Cleatrice Burke, 05/26/2022 12:22 PM ______________________________________________________________________   Discussed status with Dr. Curlene Dolphin on 05/26/22 at 1221 and received approval for admission today.  Admission Coordinator:  Cleatrice Burke, RN, time 1221 Date 05/26/22   Assessment/Plan: Diagnosis: R MCA CVA Does the need for close, 24 hr/day Medical supervision in concert with the patient's rehab needs make it unreasonable for this patient to be served in a less intensive setting? Yes Co-Morbidities requiring supervision/potential complications: HM, Afib, HLD, heart failure, BPH, Tobacco use, CKD Due to bladder management, bowel management, safety, skin/wound care, disease management, medication administration, pain management, and patient education, does the patient require 24 hr/day rehab nursing? Yes Does the patient require coordinated care of a physician, rehab nurse, PT, OT, and SLP to address physical and functional deficits in the context of the above medical diagnosis(es)? Yes Addressing deficits in the following areas: balance, endurance, locomotion, strength, transferring, bowel/bladder control, bathing, dressing, feeding, grooming, toileting, cognition, speech, language, swallowing, and psychosocial support Can the patient actively participate in an intensive therapy program of at least 3  hrs of therapy 5 days a week? Yes The potential for patient to make measurable gains while on inpatient rehab is excellent Anticipated functional outcomes upon discharge from inpatient rehab: modified independent and supervision PT, modified independent and supervision OT, supervision SLP Estimated rehab length of stay to reach the above functional goals is: 10-14 Anticipated discharge destination: Home 10. Overall Rehab/Functional Prognosis: excellent   MD Signature: Jennye Boroughs

## 2022-05-24 NOTE — TOC Progression Note (Signed)
Transition of Care [facility]) - Progression Note    Patient Details  Name: Grant [name] MRN: [id number] Date of Birth: 19-Oct-1945  [facility]) CM/SW Contact  Kermit Balo, RN Phone Number: 05/24/2022, 12:54 PM  Clinical Narrative:    CIR starting insurance auth for a potential IR admission. TOC following.   Expected Discharge Plan: IP Rehab Facility Barriers to Discharge: Continued Medical Work up  Expected Discharge Plan and Services Expected Discharge Plan: IP Rehab Facility     Post Acute Care Choice: IP Rehab                                         Social Determinants of Health (SDOH) Interventions    Readmission Risk Interventions     No data to display

## 2022-05-24 NOTE — Progress Notes (Signed)
  Inpatient Rehabilitation Admissions Coordinator   Met with patient at bedside for rehab assessment. We discussed goals and expectations of a possible CIR admit. He would like to see how he does with therapy today before deciding on staying for any rehab. I will follow up after his therapy assessments today. Please call me with any questions.   [name], RN, MSN Rehab Admissions Coordinator [phone]

## 2022-05-24 NOTE — Progress Notes (Signed)
Inpatient Rehabilitation Admissions Coordinator   I met with patient and his wife at bedside and again discussed goals and expectations of a Cir admit. He is now in agreement to pursue with Auth with the [employer] which I will begin today.  [name], RN, MSN Rehab Admissions Coordinator [phone] 05/24/2022 12:12 PM

## 2022-05-24 NOTE — Progress Notes (Signed)
Physical Therapy Treatment Patient Details Name: Grant [name] MRN: [id number] DOB: 07-Oct-1945 Today's Date: 05/24/2022   History of Present Illness Pt is 76 yo male admitted on 11/14/21 with L sided weakness for 9 days but was out of town so waited to come to hospital until returned home.  CT was negative for acute stroke and pt unable to have MRI.  Pt reports a fall onto his LT shoulder and hand ~2 months ago with LT wrist and LT shoulder pain since that time. He denies getting any x-rays: "I don't like to go to the doctor." Pt with hx of afib, AICD, dm, CAD, HLD.    PT Comments    Pt greeted OOB in recliner and agreeable to session with slow but steady progress, however pt limited by increased fatigue and weakness with noted difficulty motor planning and sequencing all movements. Pt needing mod assist to come to stand with noted posterior lean in standing needing mod assist to maintain standing balance, pt unable to step away from recliner as pt blocking legs against recliner to maintain standing. Pt needing multimodal and simple cues throughout to shift weight anterior with poor return. Pt able to march in place with max cues. Pt continues to be limited by poor balance/postural reactions, weakness and fatigue.  Current plan remains appropriate to address deficits and maximize functional independence and decrease caregiver burden. Pt continues to benefit from skilled PT services to progress toward functional mobility goals.    Recommendations for follow up therapy are one component of a multi-disciplinary discharge planning process, led by the attending physician.  Recommendations may be updated based on patient status, additional functional criteria and insurance authorization.  Follow Up Recommendations  Acute inpatient rehab (3hours/day)     Assistance Recommended at Discharge Intermittent Supervision/Assistance  Patient can return home with the following A little help with  bathing/dressing/bathroom;Assistance with cooking/housework;Help with stairs or ramp for entrance;A lot of help with walking and/or transfers   Equipment Recommendations  Rolling walker (2 wheels)    Recommendations for Other Services       Precautions / Restrictions Precautions Precautions: Fall Precaution Comments: LT wrist pain with WB (fall 2 months ago on hand. No imaging) Restrictions Weight Bearing Restrictions: No     Mobility  Bed Mobility Overal bed mobility: Needs Assistance             General bed mobility comments: pt OOB in recliner pre and post session    Transfers Overall transfer level: Needs assistance Equipment used: Rolling walker (2 wheels) Transfers: Sit to/from Stand, Bed to chair/wheelchair/BSC Sit to Stand: Mod assist           General transfer comment: Pt mod assist to get to standing today.  able to repeat x5 reps throughout session    Ambulation/Gait               General Gait Details: gait deferred as pt unable to maintain standing balance this session needing max cues for anterior weight shift, pt blocking backs of legs on reclienr to maintain standing   Stairs             Wheelchair Mobility    Modified Rankin (Stroke Patients Only) Modified Rankin (Stroke Patients Only) Pre-Morbid Rankin Score: No significant disability Modified Rankin: Moderately severe disability     Balance Overall balance assessment: History of Falls, Needs assistance (daily falls) Sitting-balance support: Single extremity supported Sitting balance-Leahy Scale: Fair Sitting balance - Comments: Posterior LOB when raises UEs (  Pt has hard time keeping L foot on the floor due to proprioceptive deficits.) Postural control: Posterior lean Standing balance support: Bilateral upper extremity supported, During functional activity, Reliant on assistive device for balance Standing balance-Leahy Scale: Poor Standing balance comment: Pt unable to  maintain standing balance without mod assist                            Cognition Arousal/Alertness: Awake/alert Behavior During Therapy: WFL for tasks assessed/performed Overall Cognitive Status: Impaired/Different from baseline Area of Impairment: Awareness, Following commands, Safety/judgement, Problem solving                       Following Commands: Follows multi-step commands inconsistently, Follows multi-step commands with increased time Safety/Judgement: Decreased awareness of safety, Decreased awareness of deficits Awareness: Intellectual Problem Solving: Difficulty sequencing, Requires verbal cues, Requires tactile cues General Comments: Pt with little insight into the severity of his deficits which makes going straight home a safety concern.        Exercises General Exercises - Lower Extremity Long Arc Quad: AROM, AAROM, Right, Both, 20 reps, Seated Hip Flexion/Marching: AROM, Right, Left, 20 reps, Seated, Standing    General Comments General comments (skin integrity, edema, etc.): pt limited by weakness this session, unable to maintain standing balance without assist, and with noted difficulty sequencing with simple commands, (seated marching, standing marching, LAQ)      Pertinent Vitals/Pain Pain Assessment Pain Assessment: Faces Faces Pain Scale: Hurts little more Pain Location: L shoulder and LT wrist. Pain Descriptors / Indicators: Discomfort, Grimacing, Guarding Pain Intervention(s): Monitored during session, Limited activity within patient's tolerance    Home Living                          Prior Function            PT Goals (current goals can now be found in the care plan section) Acute Rehab PT Goals PT Goal Formulation: With patient/family Time For Goal Achievement: 06/04/22    Frequency    Min 4X/week      PT Plan      Co-evaluation              AM-PAC PT "6 Clicks" Mobility   Outcome Measure   Help needed turning from your back to your side while in a flat bed without using bedrails?: None Help needed moving from lying on your back to sitting on the side of a flat bed without using bedrails?: A Little Help needed moving to and from a bed to a chair (including a wheelchair)?: A Little Help needed standing up from a chair using your arms (e.g., wheelchair or bedside chair)?: A Lot Help needed to walk in hospital room?: Total Help needed climbing 3-5 steps with a railing? : Total 6 Click Score: 14    End of Session Equipment Utilized During Treatment: Gait belt Activity Tolerance: Patient limited by fatigue Patient left: in chair;with call bell/phone within reach;with chair alarm set Nurse Communication: Mobility status PT Visit Diagnosis: Other abnormalities of gait and mobility (R26.89);Hemiplegia and hemiparesis Hemiplegia - Right/Left: Left Hemiplegia - dominant/non-dominant: Non-dominant Hemiplegia - caused by: Cerebral infarction     Time: 6962-9528 PT Time Calculation (min) (ACUTE ONLY): 21 min  Charges:  $Gait Training: 8-22 mins  Audry Riles. PTA Acute Rehabilitation Services Office: [place] 05/24/2022, 2:05 PM

## 2022-05-25 DIAGNOSIS — I639 Cerebral infarction, unspecified: Secondary | ICD-10-CM | POA: Diagnosis not present

## 2022-05-25 LAB — CBC
HCT: 36.1 % — ABNORMAL LOW (ref 39.0–52.0)
Hemoglobin: 11.8 g/dL — ABNORMAL LOW (ref 13.0–17.0)
MCH: 29.6 pg (ref 26.0–34.0)
MCHC: 32.7 g/dL (ref 30.0–36.0)
MCV: 90.7 fL (ref 80.0–100.0)
Platelets: 113 10*3/uL — ABNORMAL LOW (ref 150–400)
RBC: 3.98 MIL/uL — ABNORMAL LOW (ref 4.22–5.81)
RDW: 12.5 % (ref 11.5–15.5)
WBC: 6.8 10*3/uL (ref 4.0–10.5)
nRBC: 0 % (ref 0.0–0.2)

## 2022-05-25 LAB — BASIC METABOLIC PANEL
Anion gap: 10 (ref 5–15)
BUN: 9 mg/dL (ref 8–23)
CO2: 23 mmol/L (ref 22–32)
Calcium: 8.7 mg/dL — ABNORMAL LOW (ref 8.9–10.3)
Chloride: 103 mmol/L (ref 98–111)
Creatinine, Ser: 1.29 mg/dL — ABNORMAL HIGH (ref 0.61–1.24)
GFR, Estimated: 57 mL/min — ABNORMAL LOW (ref >60)
Glucose, Bld: 164 mg/dL — ABNORMAL HIGH (ref 70–99)
Potassium: 3.5 mmol/L (ref 3.5–5.1)
Sodium: 136 mmol/L (ref 135–145)

## 2022-05-25 LAB — GLUCOSE, CAPILLARY
Glucose-Capillary: 145 mg/dL — ABNORMAL HIGH (ref 70–99)
Glucose-Capillary: 187 mg/dL — ABNORMAL HIGH (ref 70–99)
Glucose-Capillary: 180 mg/dL — ABNORMAL HIGH (ref 70–99)
Glucose-Capillary: 208 mg/dL — ABNORMAL HIGH (ref 70–99)

## 2022-05-25 NOTE — Progress Notes (Signed)
PROGRESS NOTE    Grant [name]  [id number] DOB: 06-29-1945 DOA: 05/20/2022 PCP: [facility]   Brief Narrative:  Grant [name] is a 76 y.o. male with medical history significant of chronic systolic CHF with AICD; afib on Eliquis; BPH; CAD; DM; HTN; HLD; hypothyroidism; and OSA presenting with L-sided weakness and gait disturbance x 9 days.  Admitted for further workup for presumed stroke.  Neurology consulted.  Patient symptoms continue to improve but his ambulatory dysfunction is unfortunately ongoing, unsafe to discharge home, PT currently recommending transfer to CIR for ongoing inpatient rehabilitation with ultimate discharge plan for discharge home with wife.  He is otherwise medically stable for discharge.  Assessment & Plan:   Principal Problem:   Acute ischemic stroke [facility]) Active Problems:   Essential hypertension   Tobacco abuse   Hypothyroidism   CAD (coronary artery disease)   ICD (implantable cardioverter-defibrillator) in place   Diabetes mellitus with complication (HCC)   PAF (paroxysmal atrial fibrillation) (HCC)   Left-sided weakness   Atrial fibrillation, chronic (HCC)   Anticoagulated   H/O medication noncompliance   CVA (cerebral vascular accident) ([place])   Subacute CVA - Present more than 9 days prior to admission as [place] - MRI unobtainable given patient's AICD is not MRI compatible, CTA per neurology with no acute intracranial abnormalities. Severe stenosis of the right vertebral artery. - Continue Eliquis, aspirin, statin per neurology - Echo pending - PT/OT recommending inpatient rehab at this time   Afib, chronic, rate controlled - Continue amiodarone 200mg  qday for rate control - Continue Eliquis   HTN - Continue to follow, not currently on medication per med list *although it is yet to be confirmed   HLD - Continue Lipitor, markedly elevated triglycerides and VLDL with depressed HDL   DM,  insulin-dependent, well-controlled - Continue sliding scale insulin, hypoglycemic protocol   Chronic systolic CHF/CAD - AICD which is NOT MRI compatible - Appears compensated - Continue ASA as above   Hypothyroidism - Continue Synthroid at current dose for now   OSA - Continue CPAP  Mood d/o - Continue amitriptyline, Lexapro, gabapentin   Obesity - Hold Ozempic - Appears to be generally well controlled at this time   Tobacco dependence - Encourage cessation.   - This was discussed with the patient and should be reviewed on an ongoing basis.   - Patch ordered   DVT prophylaxis: Eliquis Code Status: Full Family Communication: Wife updated over the phone  Status is: Inpatient Dispo: The patient is from: Home              Anticipated d/c is to: Inpatient rehab              Anticipated d/c date is: Pending acceptance and insurance approval              Patient currently IS medically stable for discharge; awaiting safe disposition inpatient rehab  Consultants:  Neurology  Procedures:  None  Antimicrobials:  None indicated  Subjective: No acute issues or events overnight denies nausea vomiting diarrhea constipation headache fevers chills or chest pain  Objective: Vitals:   05/24/22 1930 05/24/22 2028 05/24/22 2345 05/25/22 0317  BP: (!) 137/105 (!) 135/92 (!) 135/101 128/76  Pulse: 91  93 90  Resp: 17  17 17   Temp: 97.8 F (36.6 C)  98.6 F (37 C) 98.3 F (36.8 C)  TempSrc: Oral  Oral Oral  SpO2: 94%  96% 92%  Weight:  Height:        Intake/Output Summary (Last 24 hours) at 05/25/2022 0720 Last data filed at 05/25/2022 0547 Gross per 24 hour  Intake 280 ml  Output 1350 ml  Net -1070 ml    Filed Weights   05/21/22 0526  Weight: 99.8 kg    Examination:  General:  Pleasantly resting in bed, No acute distress. HEENT:  Normocephalic atraumatic.  Sclerae nonicteric, noninjected. Neck: Without mass or deformity.  Trachea is midline. Lungs: Clear  to auscultate bilaterally without rhonchi, wheeze, or rales. Heart:  Regular rate and rhythm.  Without murmurs, rubs, or gallops. Abdomen:  Soft, nontender, nondistended.  Without guarding or rebound. Extremities: Without cyanosis, clubbing, edema, or obvious deformity. Vascular:  Dorsalis pedis and posterior tibial pulses palpable bilaterally. Skin:  Warm and dry, no erythema, no ulcerations.  Data Reviewed: I have personally reviewed following labs and imaging studies  CBC: Recent Labs  Lab 05/20/22 1934 05/23/22 1526  WBC 7.0 8.9  NEUTROABS 4.2  --   HGB 15.0 14.1  HCT 44.9 41.1  MCV 88.6 88.0  PLT 171 107*    Basic Metabolic Panel: Recent Labs  Lab 05/20/22 1934 05/23/22 1526  NA 139 138  K 3.3* 3.0*  CL 99 103  CO2 29 23  GLUCOSE 214* 190*  BUN 10 9  CREATININE 1.32* 1.48*  CALCIUM 9.3 8.9    GFR: Estimated Creatinine Clearance: 52.7 mL/min (A) (by C-G formula based on SCr of 1.48 mg/dL (H)). Liver Function Tests: Recent Labs  Lab 05/20/22 1934  AST 17  ALT 18  ALKPHOS 62  BILITOT 0.6  PROT 7.5  ALBUMIN 4.6    No results for input(s): "LIPASE", "AMYLASE" in the last 168 hours. No results for input(s): "AMMONIA" in the last 168 hours. Coagulation Profile: Recent Labs  Lab 05/20/22 1934  INR 1.1    Cardiac Enzymes: No results for input(s): "CKTOTAL", "CKMB", "CKMBINDEX", "TROPONINI" in the last 168 hours. BNP (last 3 results) No results for input(s): "PROBNP" in the last 8760 hours. HbA1C: No results for input(s): "HGBA1C" in the last 72 hours.  CBG: Recent Labs  Lab 05/24/22 0642 05/24/22 1249 05/24/22 1611 05/24/22 2131 05/25/22 0610  GLUCAP 179* 282* 166* 148* 145*    Lipid Profile: No results for input(s): "CHOL", "HDL", "LDLCALC", "TRIG", "CHOLHDL", "LDLDIRECT" in the last 72 hours.  Thyroid Function Tests: No results for input(s): "TSH", "T4TOTAL", "FREET4", "T3FREE", "THYROIDAB" in the last 72 hours. Anemia Panel: No  results for input(s): "VITAMINB12", "FOLATE", "FERRITIN", "TIBC", "IRON", "RETICCTPCT" in the last 72 hours. Sepsis Labs: No results for input(s): "PROCALCITON", "LATICACIDVEN" in the last 168 hours.  Recent Results (from the past 240 hour(s))  Urine Culture     Status: None   Collection Time: 05/23/22  4:32 PM   Specimen: In/Out Cath Urine  Result Value Ref Range Status   Specimen Description IN/OUT CATH URINE  Final   Special Requests NONE  Final   Culture   Final    NO GROWTH Performed at [facility], [address] [place], [place] [address]    Report Status 05/24/2022 FINAL  Final  Culture, blood (single) w Reflex to ID Panel     Status: None (Preliminary result)   Collection Time: 05/23/22  6:17 PM   Specimen: BLOOD LEFT ARM  Result Value Ref Range Status   Specimen Description BLOOD LEFT ARM  Final   Special Requests   Final    BOTTLES DRAWN AEROBIC AND ANAEROBIC  Blood Culture adequate volume   Culture   Final    NO GROWTH < 24 HOURS Performed at [facility], [place] [address] [place],  [address]    Report Status PENDING  Incomplete         Radiology Studies: DG CHEST PORT 1 VIEW  Result Date: 05/23/2022 CLINICAL DATA:  Hypoxia EXAM: PORTABLE CHEST 1 VIEW COMPARISON:  03/08/2017 FINDINGS: Cardiomediastinal silhouette is unchanged. LEFT subclavian pacemaker/AICD again noted. There is no evidence of focal airspace disease, pulmonary edema, suspicious pulmonary nodule/mass, pleural effusion, or pneumothorax. No acute bony abnormalities are identified. IMPRESSION: No active disease. Electronically Signed   By: Margarette Canada M.D.   On: 05/23/2022 16:54    Scheduled Meds:  amiodarone  200 mg Oral Daily   amitriptyline  75 mg Oral QHS   apixaban  5 mg Oral BID   aspirin EC  81 mg Oral Daily   atorvastatin  40 mg Oral QHS   escitalopram  10 mg Oral QHS   fluticasone  2 spray Each Nare Daily   gabapentin  300 mg Oral TID   insulin aspart  0-15 Units  Subcutaneous TID WC   insulin glargine-yfgn  32 Units Subcutaneous QHS   levothyroxine  50 mcg Oral QAC breakfast   magnesium citrate  1 Bottle Oral Once   multivitamin with minerals  1 tablet Oral Daily   polyethylene glycol  17 g Oral Daily   terazosin  5 mg Oral BID    LOS: 4 days   Time spent: 21min  Grant Stiner C Saphyra Hutt, DO [facility]  If 7PM-7AM, please contact night-coverage [email or web address]  05/25/2022, 7:20 AM

## 2022-05-25 NOTE — Progress Notes (Signed)
Inpatient Rehabilitation Admissions Coordinator   I have insurance approval for CIR and am hopeful for bed to be available in the next 24 to 48 hrs.  Ottie Glazier, RN, MSN Rehab Admissions Coordinator [phone] 05/25/2022 4:20 PM

## 2022-05-25 NOTE — Progress Notes (Signed)
  Inpatient Rehabilitation Admissions Coordinator   I await insurance Auth for possible CIR admit.  Ottie Glazier, RN, MSN Rehab Admissions Coordinator [phone] 05/25/2022 10:15 AM

## 2022-05-26 ENCOUNTER — Other Ambulatory Visit: Payer: Self-pay

## 2022-05-26 ENCOUNTER — Encounter (HOSPITAL_COMMUNITY): Payer: Self-pay | Admitting: Physical Medicine & Rehabilitation

## 2022-05-26 ENCOUNTER — Inpatient Hospital Stay (HOSPITAL_COMMUNITY)
Admission: RE | Admit: 2022-05-26 | Discharge: 2022-06-24 | DRG: 057 | Disposition: A | Discharge status: Home Health | Payer: No Typology Code available for payment source | Source: Intra-hospital | Attending: Physical Medicine & Rehabilitation | Admitting: Physical Medicine & Rehabilitation

## 2022-05-26 DIAGNOSIS — Z7982 Long term (current) use of aspirin: Secondary | ICD-10-CM

## 2022-05-26 DIAGNOSIS — I639 Cerebral infarction, unspecified: Secondary | ICD-10-CM | POA: Diagnosis not present

## 2022-05-26 DIAGNOSIS — G629 Polyneuropathy, unspecified: Secondary | ICD-10-CM | POA: Diagnosis present

## 2022-05-26 DIAGNOSIS — I69398 Other sequelae of cerebral infarction: Secondary | ICD-10-CM | POA: Diagnosis not present

## 2022-05-26 DIAGNOSIS — I69354 Hemiplegia and hemiparesis following cerebral infarction affecting left non-dominant side: Principal | ICD-10-CM

## 2022-05-26 DIAGNOSIS — I63511 Cerebral infarction due to unspecified occlusion or stenosis of right middle cerebral artery: Secondary | ICD-10-CM | POA: Diagnosis present

## 2022-05-26 DIAGNOSIS — N189 Chronic kidney disease, unspecified: Secondary | ICD-10-CM | POA: Diagnosis present

## 2022-05-26 DIAGNOSIS — F17210 Nicotine dependence, cigarettes, uncomplicated: Secondary | ICD-10-CM | POA: Diagnosis present

## 2022-05-26 DIAGNOSIS — M792 Neuralgia and neuritis, unspecified: Secondary | ICD-10-CM | POA: Diagnosis not present

## 2022-05-26 DIAGNOSIS — F32A Depression, unspecified: Secondary | ICD-10-CM | POA: Diagnosis present

## 2022-05-26 DIAGNOSIS — B9620 Unspecified Escherichia coli [E. coli] as the cause of diseases classified elsewhere: Secondary | ICD-10-CM | POA: Diagnosis present

## 2022-05-26 DIAGNOSIS — I10 Essential (primary) hypertension: Secondary | ICD-10-CM

## 2022-05-26 DIAGNOSIS — I4891 Unspecified atrial fibrillation: Secondary | ICD-10-CM | POA: Diagnosis present

## 2022-05-26 DIAGNOSIS — Z794 Long term (current) use of insulin: Secondary | ICD-10-CM | POA: Diagnosis not present

## 2022-05-26 DIAGNOSIS — Z8249 Family history of ischemic heart disease and other diseases of the circulatory system: Secondary | ICD-10-CM

## 2022-05-26 DIAGNOSIS — R278 Other lack of coordination: Secondary | ICD-10-CM | POA: Diagnosis present

## 2022-05-26 DIAGNOSIS — N3000 Acute cystitis without hematuria: Secondary | ICD-10-CM | POA: Diagnosis not present

## 2022-05-26 DIAGNOSIS — I130 Hypertensive heart and chronic kidney disease with heart failure and stage 1 through stage 4 chronic kidney disease, or unspecified chronic kidney disease: Secondary | ICD-10-CM | POA: Diagnosis present

## 2022-05-26 DIAGNOSIS — Z79899 Other long term (current) drug therapy: Secondary | ICD-10-CM

## 2022-05-26 DIAGNOSIS — B9689 Other specified bacterial agents as the cause of diseases classified elsewhere: Secondary | ICD-10-CM | POA: Diagnosis present

## 2022-05-26 DIAGNOSIS — I69322 Dysarthria following cerebral infarction: Secondary | ICD-10-CM

## 2022-05-26 DIAGNOSIS — Z7901 Long term (current) use of anticoagulants: Secondary | ICD-10-CM

## 2022-05-26 DIAGNOSIS — N400 Enlarged prostate without lower urinary tract symptoms: Secondary | ICD-10-CM | POA: Diagnosis present

## 2022-05-26 DIAGNOSIS — Z888 Allergy status to other drugs, medicaments and biological substances status: Secondary | ICD-10-CM | POA: Diagnosis not present

## 2022-05-26 DIAGNOSIS — Z91018 Allergy to other foods: Secondary | ICD-10-CM

## 2022-05-26 DIAGNOSIS — N390 Urinary tract infection, site not specified: Secondary | ICD-10-CM | POA: Diagnosis present

## 2022-05-26 DIAGNOSIS — I69392 Facial weakness following cerebral infarction: Secondary | ICD-10-CM

## 2022-05-26 DIAGNOSIS — E1122 Type 2 diabetes mellitus with diabetic chronic kidney disease: Secondary | ICD-10-CM | POA: Diagnosis present

## 2022-05-26 DIAGNOSIS — I2510 Atherosclerotic heart disease of native coronary artery without angina pectoris: Secondary | ICD-10-CM | POA: Diagnosis present

## 2022-05-26 DIAGNOSIS — E785 Hyperlipidemia, unspecified: Secondary | ICD-10-CM | POA: Diagnosis present

## 2022-05-26 DIAGNOSIS — G4733 Obstructive sleep apnea (adult) (pediatric): Secondary | ICD-10-CM | POA: Diagnosis present

## 2022-05-26 DIAGNOSIS — I5022 Chronic systolic (congestive) heart failure: Secondary | ICD-10-CM | POA: Diagnosis present

## 2022-05-26 DIAGNOSIS — Z7985 Long-term (current) use of injectable non-insulin antidiabetic drugs: Secondary | ICD-10-CM

## 2022-05-26 DIAGNOSIS — E039 Hypothyroidism, unspecified: Secondary | ICD-10-CM | POA: Diagnosis present

## 2022-05-26 DIAGNOSIS — I951 Orthostatic hypotension: Secondary | ICD-10-CM | POA: Diagnosis not present

## 2022-05-26 DIAGNOSIS — E1169 Type 2 diabetes mellitus with other specified complication: Secondary | ICD-10-CM | POA: Diagnosis not present

## 2022-05-26 DIAGNOSIS — R4182 Altered mental status, unspecified: Secondary | ICD-10-CM | POA: Diagnosis not present

## 2022-05-26 DIAGNOSIS — T426X5A Adverse effect of other antiepileptic and sedative-hypnotic drugs, initial encounter: Secondary | ICD-10-CM | POA: Diagnosis not present

## 2022-05-26 DIAGNOSIS — Z95810 Presence of automatic (implantable) cardiac defibrillator: Secondary | ICD-10-CM | POA: Diagnosis not present

## 2022-05-26 DIAGNOSIS — Z79890 Hormone replacement therapy (postmenopausal): Secondary | ICD-10-CM

## 2022-05-26 LAB — GLUCOSE, CAPILLARY
Glucose-Capillary: 168 mg/dL — ABNORMAL HIGH (ref 70–99)
Glucose-Capillary: 167 mg/dL — ABNORMAL HIGH (ref 70–99)
Glucose-Capillary: 244 mg/dL — ABNORMAL HIGH (ref 70–99)
Glucose-Capillary: 274 mg/dL — ABNORMAL HIGH (ref 70–99)

## 2022-05-26 MED ORDER — FUROSEMIDE 20 MG PO TABS
20.0000 mg | ORAL_TABLET | Freq: Every day | ORAL | Status: DC
  Administered 2022-05-26: 20 mg via ORAL
  Filled 2022-05-26: qty 1

## 2022-05-26 MED ORDER — ATORVASTATIN CALCIUM 40 MG PO TABS
40.0000 mg | ORAL_TABLET | Freq: Every day | ORAL | Status: DC
  Administered 2022-05-26 – 2022-06-23 (×29): 40 mg via ORAL
  Filled 2022-05-26 (×29): qty 1

## 2022-05-26 MED ORDER — GABAPENTIN 300 MG PO CAPS
300.0000 mg | ORAL_CAPSULE | Freq: Three times a day (TID) | ORAL | Status: DC
  Administered 2022-05-26 – 2022-05-29 (×8): 300 mg via ORAL
  Filled 2022-05-26 (×8): qty 1

## 2022-05-26 MED ORDER — INSULIN ASPART 100 UNIT/ML IJ SOLN
0.0000 [IU] | Freq: Three times a day (TID) | INTRAMUSCULAR | Status: DC
  Administered 2022-05-27: 2 [IU] via SUBCUTANEOUS
  Administered 2022-05-27 (×2): 3 [IU] via SUBCUTANEOUS
  Administered 2022-05-28 (×2): 2 [IU] via SUBCUTANEOUS
  Administered 2022-05-28: 5 [IU] via SUBCUTANEOUS
  Administered 2022-05-29: 3 [IU] via SUBCUTANEOUS
  Administered 2022-05-29: 2 [IU] via SUBCUTANEOUS
  Administered 2022-05-29: 3 [IU] via SUBCUTANEOUS
  Administered 2022-05-30 – 2022-06-23 (×13): 2 [IU] via SUBCUTANEOUS

## 2022-05-26 MED ORDER — ACETAMINOPHEN 325 MG PO TABS
650.0000 mg | ORAL_TABLET | ORAL | Status: DC | PRN
  Administered 2022-05-30 – 2022-05-31 (×2): 650 mg via ORAL
  Filled 2022-05-26 (×2): qty 2

## 2022-05-26 MED ORDER — SENNOSIDES-DOCUSATE SODIUM 8.6-50 MG PO TABS
1.0000 | ORAL_TABLET | Freq: Every evening | ORAL | Status: DC | PRN
  Administered 2022-06-19: 1 via ORAL
  Filled 2022-05-26: qty 1

## 2022-05-26 MED ORDER — ACETAMINOPHEN 160 MG/5ML PO SOLN: 650.0000 mg | ORAL | Status: DC | PRN

## 2022-05-26 MED ORDER — ADULT MULTIVITAMIN W/MINERALS CH
1.0000 | ORAL_TABLET | Freq: Every day | ORAL | Status: DC
  Administered 2022-05-27 – 2022-06-24 (×28): 1 via ORAL
  Filled 2022-05-26 (×31): qty 1

## 2022-05-26 MED ORDER — TERAZOSIN HCL 5 MG PO CAPS
5.0000 mg | ORAL_CAPSULE | Freq: Two times a day (BID) | ORAL | Status: DC
  Administered 2022-05-26 – 2022-05-29 (×6): 5 mg via ORAL
  Filled 2022-05-26 (×6): qty 1

## 2022-05-26 MED ORDER — ASPIRIN 81 MG PO TBEC
81.0000 mg | DELAYED_RELEASE_TABLET | Freq: Every day | ORAL | Status: DC
  Administered 2022-05-27 – 2022-06-24 (×29): 81 mg via ORAL
  Filled 2022-05-26 (×29): qty 1

## 2022-05-26 MED ORDER — ESCITALOPRAM OXALATE 10 MG PO TABS
10.0000 mg | ORAL_TABLET | Freq: Every day | ORAL | Status: DC
  Administered 2022-05-26 – 2022-06-23 (×29): 10 mg via ORAL
  Filled 2022-05-26 (×29): qty 1

## 2022-05-26 MED ORDER — POLYETHYLENE GLYCOL 3350 17 G PO PACK
17.0000 g | PACK | Freq: Every day | ORAL | Status: DC
  Administered 2022-05-27 – 2022-06-24 (×27): 17 g via ORAL
  Filled 2022-05-26 (×27): qty 1

## 2022-05-26 MED ORDER — APIXABAN 5 MG PO TABS
5.0000 mg | ORAL_TABLET | Freq: Two times a day (BID) | ORAL | Status: DC
  Administered 2022-05-26 – 2022-06-24 (×58): 5 mg via ORAL
  Filled 2022-05-26 (×58): qty 1

## 2022-05-26 MED ORDER — AMIODARONE HCL 200 MG PO TABS
200.0000 mg | ORAL_TABLET | Freq: Every day | ORAL | Status: DC
  Administered 2022-05-27 – 2022-06-24 (×29): 200 mg via ORAL
  Filled 2022-05-26 (×29): qty 1

## 2022-05-26 MED ORDER — LEVOTHYROXINE SODIUM 50 MCG PO TABS
50.0000 ug | ORAL_TABLET | Freq: Every day | ORAL | Status: DC
  Administered 2022-05-27 – 2022-06-24 (×29): 50 ug via ORAL
  Filled 2022-05-26 (×29): qty 1

## 2022-05-26 MED ORDER — CYCLOBENZAPRINE HCL 5 MG PO TABS: 10.0000 mg | ORAL_TABLET | Freq: Every evening | ORAL | Status: DC | PRN

## 2022-05-26 MED ORDER — AMITRIPTYLINE HCL 25 MG PO TABS
75.0000 mg | ORAL_TABLET | Freq: Every day | ORAL | Status: DC
  Administered 2022-05-26 – 2022-06-03 (×9): 75 mg via ORAL
  Filled 2022-05-26: qty 1
  Filled 2022-05-26 (×7): qty 3
  Filled 2022-05-26: qty 1
  Filled 2022-05-26: qty 3
  Filled 2022-05-26: qty 1
  Filled 2022-05-26: qty 3

## 2022-05-26 MED ORDER — INSULIN GLARGINE-YFGN 100 UNIT/ML ~~LOC~~ SOLN
32.0000 [IU] | Freq: Every day | SUBCUTANEOUS | Status: DC
  Administered 2022-05-26 – 2022-06-01 (×7): 32 [IU] via SUBCUTANEOUS
  Filled 2022-05-26 (×8): qty 0.32

## 2022-05-26 MED ORDER — ACETAMINOPHEN 650 MG RE SUPP: 650.0000 mg | RECTAL | Status: DC | PRN

## 2022-05-26 MED ORDER — FLUTICASONE PROPIONATE 50 MCG/ACT NA SUSP
2.0000 | Freq: Every day | NASAL | Status: DC
  Administered 2022-05-28 – 2022-06-24 (×28): 2 via NASAL
  Filled 2022-05-26: qty 16

## 2022-05-26 NOTE — Progress Notes (Signed)
Jennye Boroughs, MD  Physician Physical Medicine and Rehabilitation   PMR Pre-admission    Signed   Date of Service: 05/24/2022  2:18 PM  Related encounter: ED to Hosp-Admission (Current) from 05/20/2022 in [facility]   Signed      Show:Clear all _0 Written_1 Templated_2 Copied  Added by: _3 Cristina Gong, RN_4 Jennye Boroughs, MD  _5 Hover for details PMR Admission Coordinator Pre-Admission Assessment   Patient: Grant [name] is an 76 y.o., male MRN: [id number] DOB: 1946-05-25 Height: _6  (188 cm) Weight: 99.8 kg   Insurance Information HMO:     PPO:      PCP:      IPA:      80/20:      OTHER:  PRIMARY: [place]      Policy#: [id number]      Subscriber: pt CM Name: Sherlynn Carbon      Phone#: [phone] or email [email or web address]     Fax#: [phone] or use email Pre-Cert#: [id number]      Employer:  Benefits:  Phone #: [phone]     Name: 12/4 Eff. Date: 11/13/17     Deduct: none      Out of Pocket Max: none [facility]: per [facility]      SNF: per [facility] Outpatient: per [facility]     Co-Pay:  Home Health: per [facility]      Co-Pay:  DME: per [facility]    Co-Pay:  Providers: in network  SECONDARY: none      Policy#:      Phone#:    Development worker, community:       Phone#:    The Engineer, petroleum" for patients in Inpatient Rehabilitation Facilities with attached "Privacy Act [place] Records" was provided and verbally reviewed with: Patient and Family   Emergency Contact Information Contact Information       Name Relation Home Work Mobile    Crawford-Christopoulos,Betty Spouse [phone]   [phone]         Current Medical History  Patient Admitting Diagnosis: CVA   History of Present Illness: 76 year old male with history of DM, CAD, HLD , chronic systolic CHF, mood disorder, afib on Eliquis, hypothyroidism and OSA who was out of town in [place] when he began having left sided numbness and weakness on left side. He did not  seek medical care until returned to [facility] . Presented on 05/20/22.   MRI unobtainable given AICD not MRI compatible. CTA per Neurology with no acute intracranial abnormalities. Severe stenosis of the right vertebral artery.  Felt to be subacute Right MCA ischemic infarct likely due to Afib on Eliquis. Neurology recommend continue Eliquis, ASA and Statin. 2 d echo extremely limited with EF 60 to 65%. Home meds of Losartan for HTN. LDL 82 with home Lipitor resumed. DM type 2 with Hgb A1c 9.9. Home meds of insulin and now SSI. Continue on amiodarone for rate control for A fib. Continue amitriptyline, Lexapro and gabapentin for mood disorder. Synthroid for hypothyroidism.    Complete NIHSS TOTAL: 5   Patient's medical record from [facility] has been reviewed by the rehabilitation admission coordinator and physician.   Past Medical History      Past Medical History:  Diagnosis Date   AICD (automatic cardioverter/defibrillator) present     BPH (benign prostatic hyperplasia)     CAD (coronary artery disease)     DM (diabetes mellitus) ([place])     HLD (hyperlipidemia)  Hyperglycemia 03/09/2017   Hypothyroidism     NSVT (nonsustained ventricular tachycardia) (HCC)     Obstructive sleep apnea      Has the patient had major surgery during 100 days prior to admission? No   Family History   family history includes CAD in his mother; Cancer - Colon in his brother and father; Hypertension in his mother.   Current Medications   Current Facility-Administered Medications:    acetaminophen (TYLENOL) tablet 650 mg, 650 mg, Oral, Q4H PRN, 650 mg at 05/23/22 1702 **OR** acetaminophen (TYLENOL) 160 MG/5ML solution 650 mg, 650 mg, Per Tube, Q4H PRN **OR** acetaminophen (TYLENOL) suppository 650 mg, 650 mg, Rectal, Q4H PRN, Karmen Bongo, MD   amiodarone (PACERONE) tablet 200 mg, 200 mg, Oral, Daily, Little Ishikawa, MD, 200 mg at 05/26/22 1034   amitriptyline (ELAVIL) tablet 75 mg, 75 mg,  Oral, QHS, Karmen Bongo, MD, 75 mg at 05/25/22 2218   apixaban (ELIQUIS) tablet 5 mg, 5 mg, Oral, BID, Shafer, Devon, NP, 5 mg at 05/26/22 1034   aspirin EC tablet 81 mg, 81 mg, Oral, Daily, Karmen Bongo, MD, 81 mg at 05/26/22 1037   atorvastatin (LIPITOR) tablet 40 mg, 40 mg, Oral, QHS, Karmen Bongo, MD, 40 mg at 05/25/22 2217   cyclobenzaprine (FLEXERIL) tablet 10 mg, 10 mg, Oral, QHS PRN, Karmen Bongo, MD   escitalopram (LEXAPRO) tablet 10 mg, 10 mg, Oral, QHS, Karmen Bongo, MD, 10 mg at 05/25/22 2217   fluticasone (FLONASE) 50 MCG/ACT nasal spray 2 spray, 2 spray, Each Nare, Daily, Karmen Bongo, MD, 2 spray at 05/26/22 1035   furosemide (LASIX) tablet 20 mg, 20 mg, Oral, Daily, Bonnielee Haff, MD   gabapentin (NEURONTIN) capsule 300 mg, 300 mg, Oral, TID, Karmen Bongo, MD, 300 mg at 05/26/22 1034   insulin aspart (novoLOG) injection 0-15 Units, 0-15 Units, Subcutaneous, TID WC, Karmen Bongo, MD, 3 Units at 05/26/22 0629   insulin glargine-yfgn (SEMGLEE) injection 32 Units, 32 Units, Subcutaneous, Ivery Quale, MD, 32 Units at 05/25/22 2220   levothyroxine (SYNTHROID) tablet 50 mcg, 50 mcg, Oral, QAC breakfast, Karmen Bongo, MD, 50 mcg at 05/26/22 0383   magnesium citrate solution 1 Bottle, 1 Bottle, Oral, Once, Little Ishikawa, MD   multivitamin with minerals tablet 1 tablet, 1 tablet, Oral, Daily, Karmen Bongo, MD, 1 tablet at 05/26/22 1034   polyethylene glycol (MIRALAX / GLYCOLAX) packet 17 g, 17 g, Oral, Daily, Little Ishikawa, MD, 17 g at 05/25/22 1149   senna-docusate (Senokot-S) tablet 1 tablet, 1 tablet, Oral, QHS PRN, Karmen Bongo, MD, 1 tablet at 05/23/22 1017   terazosin (HYTRIN) capsule 5 mg, 5 mg, Oral, BID, Karmen Bongo, MD, 5 mg at 05/26/22 1034   Patients Current Diet:  Diet Order                  Diet Carb Modified Fluid consistency: Thin; Room service appropriate? Yes with Assist  Diet effective now                        Precautions / Restrictions Precautions Precautions: Fall Precaution Comments: LT wrist pain with WB (fall 2 months ago on hand. No imaging) Restrictions Weight Bearing Restrictions: No    Has the patient had 2 or more falls or a fall with injury in the past year? No   Prior Activity Level Community (5-7x/wk): Mod I with cane in community, did own adls   Prior Functional Level Self Care: Did the  patient need help bathing, dressing, using the toilet or eating? Independent   Indoor Mobility: Did the patient need assistance with walking from room to room (with or without device)? Independent   Stairs: Did the patient need assistance with internal or external stairs (with or without device)? Independent   Functional Cognition: Did the patient need help planning regular tasks such as shopping or remembering to take medications? Independent   Patient Information Are you of Hispanic, Latino/a,or Spanish origin?: A. No, not of Hispanic, Latino/a, or Spanish origin What is your race?: B. Black or African American Do you need or want an interpreter to communicate with a doctor or health care staff?: 0. No   Patient's Response To:  Health Literacy and Transportation Is the patient able to respond to health literacy and transportation needs?: Yes Health Literacy - How often do you need to have someone help you when you read instructions, pamphlets, or other written material from your doctor or pharmacy?: Never In the past 12 months, has lack of transportation kept you from medical appointments or from getting medications?: No In the past 12 months, has lack of transportation kept you from meetings, work, or from getting things needed for daily living?: No   Development worker, international aid / [place] Devices/Equipment: CBG Meter, Radio producer (specify quad or straight), Eyeglasses Home Equipment: Grab bars - tub/shower, Cane - single point   Prior Device Use: Indicate devices/aids  used by the patient prior to current illness, exacerbation or injury?  cane   Current Functional Level Cognition   Arousal/Alertness: Awake/alert Overall Cognitive Status: Impaired/Different from baseline Orientation Level: Oriented X4 Following Commands: Follows multi-step commands inconsistently, Follows multi-step commands with increased time Safety/Judgement: Decreased awareness of safety, Decreased awareness of deficits General Comments: Pt with little insight into the severity of his deficits which makes going straight home a safety concern. Attention: Selective Selective Attention: Impaired Selective Attention Impairment: Verbal complex Memory: Impaired Memory Impairment: Decreased recall of new information Problem Solving: Appears intact    Extremity Assessment (includes Sensation/Coordination)   Upper Extremity Assessment: LUE deficits/detail RUE Coordination: decreased fine motor LUE Deficits / Details: L UE with weakness, decreased sensation, decreased coordination. Has very weak compostire grip ~3-/5, Has wrist and LT soulder pain with limited shoulder ROM to ~70 degrees and wrist ROM WFL but slow to move and painful with weight bearing when scooting to EOB. LUE: Unable to fully assess due to pain LUE Sensation: decreased light touch LUE Coordination: decreased fine motor, decreased gross motor  Lower Extremity Assessment: Defer to PT evaluation RLE Deficits / Details: ROM WFL ; MMT 5/5 LLE Deficits / Details: ROM WFL; MMT: ankle 4/5, knee ext 4/5 and painful in hamstrings, hip 3/5 LLE Sensation: decreased light touch LLE Coordination: decreased gross motor, decreased fine motor     ADLs   Overall ADL's : Needs assistance/impaired Eating/Feeding: Set up, Sitting Eating/Feeding Details (indicate cue type and reason): Attempted to have pt cut own food but unable to use L hand effectively. Pt with decreased coordination, strength and proprioception of the LUE. Grooming:  Wash/dry hands, Wash/dry face, Set up, Sitting Grooming Details (indicate cue type and reason): Pt sat on EOB with supervision to complete basic grooming tasks. Upper Body Bathing: Moderate assistance, Sitting, Cueing for sequencing, Cueing for compensatory techniques Upper Body Bathing Details (indicate cue type and reason): Pt very uncoordinated with LUE making bathing difficult.  Pt drops items regularly with LUE. Lower Body Bathing: Moderate assistance, Sit to/from stand, Sitting/lateral leans, +  2 for safety/equipment, Cueing for safety Upper Body Dressing : Minimal assistance, Sitting, Cueing for sequencing Lower Body Dressing: Moderate assistance, Maximal assistance, +2 for safety/equipment, Cueing for safety, Cueing for sequencing, Sit to/from stand, Sitting/lateral leans Lower Body Dressing Details (indicate cue type and reason): Moderate assist sitting. Maximum assist standing. Toilet Transfer: Moderate assistance, Rolling walker (2 wheels), BSC/3in1, Ambulation Toilet Transfer Details (indicate cue type and reason): Pt required mod assist to shift weight to balls of feet when first up and continued mod assist  to transfer to chair. Pt with posterior lean during transfer. If therapist not there, pt would have fallen posteriorly.  Pt with little insight to this posterior lean which makes pt a fall risk. Toileting- Clothing Manipulation and Hygiene: Moderate assistance, Sit to/from stand, Sitting/lateral lean Toileting - Clothing Manipulation Details (indicate cue type and reason): Based on general assessment. Functional mobility during ADLs: Moderate assistance, Rolling walker (2 wheels) General ADL Comments: Would need +2 if pt moved any further than from bed to chair. Pt did not get up all weekend and was weak getting to his feet and felt unstable in standing.     Mobility   Overal bed mobility: Needs Assistance Bed Mobility: Supine to Sit Supine to sit: HOB elevated, Min assist Sit to  supine: HOB elevated, Min guard General bed mobility comments: pt OOB in recliner pre and post session     Transfers   Overall transfer level: Needs assistance Equipment used: Rolling walker (2 wheels) Transfers: Sit to/from Stand, Bed to chair/wheelchair/BSC Sit to Stand: Mod assist Bed to/from chair/wheelchair/BSC transfer type:: Stand pivot Stand pivot transfers: Mod assist General transfer comment: Pt mod assist to get to standing today.  able to repeat x5 reps throughout session     Ambulation / Gait / Stairs / Wheelchair Mobility   Ambulation/Gait Ambulation/Gait assistance: Mod assist Gait Distance (Feet): 10 Feet Assistive device: Rolling walker (2 wheels) Gait Pattern/deviations: Decreased stride length, Decreased stance time - left, Step-through pattern, Step-to pattern, Decreased step length - left, Decreased weight shift to right General Gait Details: gait deferred as pt unable to maintain standing balance this session needing max cues for anterior weight shift, pt blocking backs of legs on reclienr to maintain standing Gait velocity: decreased     Posture / Balance Dynamic Sitting Balance Sitting balance - Comments: Posterior LOB when raises UEs (Pt has hard time keeping L foot on the floor due to proprioceptive deficits.) Balance Overall balance assessment: History of Falls, Needs assistance (daily falls) Sitting-balance support: Single extremity supported Sitting balance-Leahy Scale: Fair Sitting balance - Comments: Posterior LOB when raises UEs (Pt has hard time keeping L foot on the floor due to proprioceptive deficits.) Postural control: Posterior lean Standing balance support: Bilateral upper extremity supported, During functional activity, Reliant on assistive device for balance Standing balance-Leahy Scale: Poor Standing balance comment: Pt unable to maintain standing balance without mod assist     Special needs/care consideration Hgb A1c 9.9    Previous Home  Environment  Living Arrangements: Spouse/significant other  Lives With: Spouse Available Help at Discharge: Family, Available 24 hours/day Type of Home: House Home Layout: One level Home Access: Stairs to enter Entrance Stairs-Rails: Right Entrance Stairs-Number of Steps: 2 Bathroom Shower/Tub: Multimedia programmer: Standard Bathroom Accessibility: Yes How Accessible: Accessible via walker Home Care Services: No   Discharge Living Setting Plans for Discharge Living Setting: Patient's home, Lives with (comment) (wife) Type of Home at Discharge: House Discharge Home Layout: One level  Discharge Home Access: Stairs to enter Entrance Stairs-Rails: Right Entrance Stairs-Number of Steps: 2 Discharge Bathroom Shower/Tub: Walk-in shower Discharge Bathroom Toilet: Standard Discharge Bathroom Accessibility: Yes How Accessible: Accessible via walker Does the patient have any problems obtaining your medications?: No   Social/Family/Support Systems Patient Roles: Spouse Contact Information: wife, Inez Catalina Anticipated Caregiver: wife Anticipated Caregiver's Contact Information: see contacts Ability/Limitations of Caregiver: supervision to min assist Caregiver Availability: 24/7 Discharge Plan Discussed with Primary Caregiver: Yes Is Caregiver In Agreement with Plan?: Yes Does Caregiver/Family have Issues with Lodging/Transportation while Pt is in Rehab?: No   Goals Patient/Family Goal for Rehab: Mod I to supervision with PT and OT, supervision with SLP Expected length of stay: ELOS 10 to 14 days Pt/Family Agrees to Admission and willing to participate: Yes Program Orientation Provided & Reviewed with Pt/Caregiver Including Roles  & Responsibilities: Yes   Decrease burden of Care through IP rehab admission: n/a   Possible need for SNF placement upon discharge: not anticipated   Patient Condition: I have reviewed medical records from [facility], spoken with CM, and  patient and spouse. I met with patient at the bedside for inpatient rehabilitation assessment.  Patient will benefit from ongoing PT, OT, and SLP, can actively participate in 3 hours of therapy a day 5 days of the week, and can make measurable gains during the admission.  Patient will also benefit from the coordinated team approach during an Inpatient Acute Rehabilitation admission.  The patient will receive intensive therapy as well as Rehabilitation physician, nursing, social worker, and care management interventions.  Due to bladder management, bowel management, safety, skin/wound care, disease management, medication administration, pain management, and patient education the patient requires 24 hour a day rehabilitation nursing.  The patient is currently mod assist overall with mobility and basic ADLs.  Discharge setting and therapy post discharge at home with home health is anticipated.  Patient has agreed to participate in the Acute Inpatient Rehabilitation Program and will admit today.   Preadmission Screen Completed By:  Cleatrice Burke, 05/26/2022 12:22 PM ______________________________________________________________________   Discussed status with Dr. Curlene Dolphin on 05/26/22 at 1221 and received approval for admission today.   Admission Coordinator:  Cleatrice Burke, RN, time 1221 Date 05/26/22    Assessment/Plan: Diagnosis: R MCA CVA Does the need for close, 24 hr/day Medical supervision in concert with the patient's rehab needs make it unreasonable for this patient to be served in a less intensive setting? Yes Co-Morbidities requiring supervision/potential complications: HM, Afib, HLD, heart failure, BPH, Tobacco use, CKD Due to bladder management, bowel management, safety, skin/wound care, disease management, medication administration, pain management, and patient education, does the patient require 24 hr/day rehab nursing? Yes Does the patient require coordinated care of a  physician, rehab nurse, PT, OT, and SLP to address physical and functional deficits in the context of the above medical diagnosis(es)? Yes Addressing deficits in the following areas: balance, endurance, locomotion, strength, transferring, bowel/bladder control, bathing, dressing, feeding, grooming, toileting, cognition, speech, language, swallowing, and psychosocial support Can the patient actively participate in an intensive therapy program of at least 3 hrs of therapy 5 days a week? Yes The potential for patient to make measurable gains while on inpatient rehab is excellent Anticipated functional outcomes upon discharge from inpatient rehab: modified independent and supervision PT, modified independent and supervision OT, supervision SLP Estimated rehab length of stay to reach the above functional goals is: 10-14 Anticipated discharge destination: Home 10. Overall Rehab/Functional Prognosis: excellent  MD Signature: Jennye Boroughs         Revision History

## 2022-05-26 NOTE — TOC Transition Note (Signed)
Transition of Care [facility]) - CM/SW Discharge Note   Patient Details  Name: Ken Bonn MRN: [id number] Date of Birth: 11/19/1945  [facility] CM/SW Contact:  Kermit Balo, RN Phone Number: 05/26/2022, 11:58 AM   Clinical Narrative:    Pt is discharging to CIR today. No further needs per TOC.   Final next level of care: IP Rehab Facility Barriers to Discharge: No Barriers Identified   Patient Goals and CMS Choice   CMS Medicare.gov Compare Post Acute Care list provided to:: Patient Choice offered to / list presented to : Patient  Discharge Placement                       Discharge Plan and Services     Post Acute Care Choice: IP Rehab                               Social Determinants of Health (SDOH) Interventions     Readmission Risk Interventions     No data to display

## 2022-05-26 NOTE — Discharge Summary (Signed)
[facility]  Physician Discharge Summary   Patient ID: Grant [name] MRN: [id number] DOB/AGE: 11/28/1945 76 y.o.  Admit date: 05/20/2022 Discharge date:   05/26/2022   PCP: [facility]  DISCHARGE DIAGNOSES:    Acute ischemic stroke [facility])   Essential hypertension   Tobacco abuse   Hypothyroidism   CAD (coronary artery disease)   ICD (implantable cardioverter-defibrillator) in place   Diabetes mellitus with complication ([place])   PAF (paroxysmal atrial fibrillation) (HCC)   Atrial fibrillation, chronic ([place])  PATIENT BEING DISCHARGED TO [facility]  RECOMMENDATIONS FOR OUTPATIENT FOLLOW UP: Ambulatory referral sent to neurology for follow-up Reintroduce antihypertensives depending on blood pressure readings, heart rate Check basic metabolic panel every few days.   CODE STATUS: Full code  DISCHARGE CONDITION: fair  Diet recommendation: Modified carbohydrate with thin liquids  INITIAL HISTORY: 76 y.o. male with medical history significant of chronic systolic CHF with AICD; afib on Eliquis; BPH; CAD; DM; HTN; HLD; hypothyroidism; and OSA presenting with L-sided weakness and gait disturbance x 9 days.  Admitted for further workup for presumed stroke.  Neurology consulted.   Patient symptoms continue to improve but his ambulatory dysfunction is unfortunately ongoing, unsafe to discharge home, PT currently recommending transfer to CIR for ongoing inpatient rehabilitation with ultimate discharge plan for discharge home with wife.  He is otherwise medically stable for discharge.  Consultations: Neurology   HOSPITAL COURSE:   Subacute CVA - Present more than 9 days prior to [place] - MRI unobtainable given patient's AICD is not MRI compatible, CTA per neurology with no acute intracranial abnormalities. Severe stenosis of the right vertebral artery. - Continue Eliquis, aspirin, statin per  neurology -Echocardiogram shows normal systolic function.   - PT/OT recommending inpatient rehab at this time   Afib, chronic, rate controlled - Continue amiodarone 200mg  qday for rate control - Continue Eliquis   Essential hypertension At home patient is supposed to be on metoprolol and Cozaar.  Currently on hold.  These can be changed to days based on blood pressure readings and heart rate.   Hyperlipidemia - Continue Lipitor, markedly elevated triglycerides and VLDL with depressed HDL   DM, insulin-dependent, well-controlled   Chronic systolic CHF/CAD - AICD which is NOT MRI compatible - Appears compensated Lasix 20 mg daily can be resumed.  Monitor electrolytes periodically.   Hypothyroidism - Continue Synthroid    OSA - Continue CPAP   Mood d/o - Continue amitriptyline, Lexapro, gabapentin   Tobacco dependence - Encourage cessation.   - This was discussed with the patient and should be reviewed on an ongoing basis.   - Patch ordered   Patient is stable.  Okay for discharge to CIR.   PERTINENT LABS:  The results of significant diagnostics from this hospitalization (including imaging, microbiology, ancillary and laboratory) are listed below for reference.    Microbiology: Recent Results (from the past 240 hour(s))  Urine Culture     Status: None   Collection Time: 05/23/22  4:32 PM   Specimen: In/Out Cath Urine  Result Value Ref Range Status   Specimen Description IN/OUT CATH URINE  Final   Special Requests NONE  Final   Culture   Final    NO GROWTH Performed at [facility], [address] [place], [place] [address]    Report Status 05/24/2022 FINAL  Final  Culture, blood (single) w Reflex to ID Panel     Status: None (Preliminary result)  Collection Time: 05/23/22  6:17 PM   Specimen: BLOOD LEFT ARM  Result Value Ref Range Status   Specimen Description BLOOD LEFT ARM  Final   Special Requests   Final    BOTTLES DRAWN AEROBIC AND ANAEROBIC  Blood Culture adequate volume   Culture   Final    NO GROWTH 3 DAYS Performed at [facility], [address], [place], [place] [address]    Report Status PENDING  Incomplete     Labs:   Basic Metabolic Panel: Recent Labs  Lab 05/20/22 1934 05/23/22 1526 05/25/22 0651  NA 139 138 136  K 3.3* 3.0* 3.5  CL 99 103 103  CO2 29 23 23   GLUCOSE 214* 190* 164*  BUN 10 9 9   CREATININE 1.32* 1.48* 1.29*  CALCIUM 9.3 8.9 8.7*   Liver Function Tests: Recent Labs  Lab 05/20/22 1934  AST 17  ALT 18  ALKPHOS 62  BILITOT 0.6  PROT 7.5  ALBUMIN 4.6    CBC: Recent Labs  Lab 05/20/22 1934 05/23/22 1526 05/25/22 0651  WBC 7.0 8.9 6.8  NEUTROABS 4.2  --   --   HGB 15.0 14.1 11.8*  HCT 44.9 41.1 36.1*  MCV 88.6 88.0 90.7  PLT 171 107* 113*     CBG: Recent Labs  Lab 05/25/22 1212 05/25/22 1619 05/25/22 2129 05/26/22 0612 05/26/22 1200  GLUCAP 187* 180* 208* 168* 167*     IMAGING STUDIES DG CHEST PORT 1 VIEW  Result Date: 05/23/2022 CLINICAL DATA:  Hypoxia EXAM: PORTABLE CHEST 1 VIEW COMPARISON:  03/08/2017 FINDINGS: Cardiomediastinal silhouette is unchanged. LEFT subclavian pacemaker/AICD again noted. There is no evidence of focal airspace disease, pulmonary edema, suspicious pulmonary nodule/mass, pleural effusion, or pneumothorax. No acute bony abnormalities are identified. IMPRESSION: No active disease. Electronically Signed   By: 14/08/2021 M.D.   On: 05/23/2022 16:54   ECHOCARDIOGRAM COMPLETE  Result Date: 05/21/2022    ECHOCARDIOGRAM REPORT   Patient Name:   Grant [name] Date of Exam: 05/21/2022 Medical Rec #:  Grant [name]            Height:       74.0 in Accession #:    14/06/2021           Weight:       220.0 lb Date of Birth:  10-01-1945            BSA:          2.263 m Patient Age:    76 years             BP:           158/92 mmHg Patient Gender: M                    HR:           69 bpm. Exam Location:  Inpatient Procedure: 2D Echo, Cardiac  Doppler, Color Doppler and Intracardiac            Opacification Agent Indications:    Stroke I63.9  History:        Patient has no prior history of Echocardiogram examinations.                 CAD, Stroke; Risk Factors:Hypertension, Current Smoker and                 Diabetes.  Sonographer:    11/16/1945 Referring Phys: [id number] JENNIFER YATES  Sonographer Comments: Suboptimal parasternal  window, suboptimal apical window, suboptimal subcostal window, no subcostal window and Technically difficult study due to poor echo windows. Image acquisition challenging due to patient body habitus and Image acquisition challenging due to respiratory motion. IMPRESSIONS  1. Extremely limited due to poor sound wave transmission; LV function appears to be preserved on definity images; suggest TEE or cardiac MRI to better evaluate if clinically indicated.  2. Left ventricular ejection fraction, by estimation, is 60 to 65%. The left ventricle has normal function. The left ventricle has no regional wall motion abnormalities. Left ventricular diastolic parameters are consistent with Grade I diastolic dysfunction (impaired relaxation).  3. Right ventricular systolic function was not well visualized. The right ventricular size is not well visualized.  4. The mitral valve was not well visualized. not well visualized mitral valve regurgitation.  5. Tricuspid valve regurgitation not well visualized.  6. The aortic valve is normal in structure. Aortic valve regurgitation Not well visualized.  7. Pulmonic valve regurgitation not well visualized.  8. Aortic dilatation noted. There is mild dilatation of the aortic root, measuring 40 mm. FINDINGS  Left Ventricle: Left ventricular ejection fraction, by estimation, is 60 to 65%. The left ventricle has normal function. The left ventricle has no regional wall motion abnormalities. Definity contrast agent was given IV to delineate the left ventricular  endocardial borders. The left ventricular internal  cavity size was normal in size. Suboptimal image quality limits for assessment of left ventricular hypertrophy. Left ventricular diastolic parameters are consistent with Grade I diastolic dysfunction (impaired relaxation). Right Ventricle: The right ventricular size is not well visualized. Right vetricular wall thickness was not well visualized. Right ventricular systolic function was not well visualized. Left Atrium: Left atrial size was not well visualized. Right Atrium: Right atrial size was not well visualized. Pericardium: There is no evidence of pericardial effusion. Mitral Valve: The mitral valve was not well visualized. Not well visualized mitral valve regurgitation. Tricuspid Valve: The tricuspid valve is grossly normal. Tricuspid valve regurgitation not well visualized. Aortic Valve: The aortic valve is normal in structure. Aortic valve regurgitation Not well visualized. Pulmonic Valve: The pulmonic valve was not well visualized. Pulmonic valve regurgitation not well visualized. Aorta: Aortic dilatation noted. There is mild dilatation of the aortic root, measuring 40 mm. IAS/Shunts: The interatrial septum was not well visualized. Additional Comments: Extremely limited due to poor sound wave transmission; LV function appears to be preserved on definity images; suggest TEE or cardiac MRI to better evaluate if clinically indicated. A device lead is visualized.  LEFT VENTRICLE PLAX 2D LVOT diam:     1.90 cm   Diastology LVOT Area:     2.84 cm  LV e' medial:    5.91 cm/s                          LV E/e' medial:  6.3                          LV e' lateral:   6.37 cm/s                          LV E/e' lateral: 5.8  RIGHT VENTRICLE RV S prime:     14.80 cm/s TAPSE (M-mode): 2.2 cm LEFT ATRIUM           Index LA Vol (A4C): 48.4 ml 21.38 ml/m   AORTA Ao Root diam: 4.00 cm  MITRAL VALVE MV Area (PHT): 5.31 cm    SHUNTS MV Decel Time: 143 msec    Systemic Diam: 1.90 cm MV E velocity: 37.20 cm/s MV A velocity: 57.60  cm/s MV E/A ratio:  0.65 Kirk Ruths MD Electronically signed by Kirk Ruths MD Signature Date/Time: 05/21/2022/5:24:27 PM    Final    CT ANGIO HEAD NECK W WO CM  Result Date: 05/21/2022 CLINICAL DATA:  Stroke suspected EXAM: CT ANGIOGRAPHY HEAD AND NECK TECHNIQUE: Multidetector CT imaging of the head and neck was performed using the standard protocol during bolus administration of intravenous contrast. Multiplanar CT image reconstructions and MIPs were obtained to evaluate the vascular anatomy. Carotid stenosis measurements (when applicable) are obtained utilizing NASCET criteria, using the distal internal carotid diameter as the denominator. RADIATION DOSE REDUCTION: This exam was performed according to the departmental dose-optimization program which includes automated exposure control, adjustment of the mA and/or kV according to patient size and/or use of iterative reconstruction technique. CONTRAST:  50mL OMNIPAQUE IOHEXOL 350 MG/ML SOLN COMPARISON:  CT Head 05/20/22 FINDINGS: CT HEAD FINDINGS Brain: Unchanged right thalamic small-vessel infarcts. No hemorrhage. No hydrocephalus. No extra-axial fluid collection. No CT evidence of new infarct. 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 Vascular: See below. There are dense atherosclerotic calcifications of the intracranial vasculature. Skull: Normal. Negative for fracture or focal lesion. Sinuses/Orbits: Air-fluid level in the left maxillary sinus. Other: None. Review of the MIP images confirms the above findings CTA NECK FINDINGS Aortic arch: Standard branching. Imaged portion shows no evidence of aneurysm or dissection. No significant stenosis of the major arch vessel origins. Right carotid system: There is mild narrowing of the origin of the right SCA secondary to calcified atherosclerotic plaque. Left carotid system: No evidence of dissection, stenosis (50% or greater), or occlusion. Vertebral arteries: There  is severe nearly occlusive stenosis at the origin of the right vertebral artery. There are additional scattered regions of mild-to-moderate atherosclerotic narrowing in the V3 and V4 segments of the bilateral vertebral arteries Skeleton: Negative. Other neck: Negative. Upper chest: Moderate centrilobular emphysema. Review of the MIP images confirms the above findings CTA HEAD FINDINGS Anterior circulation: No significant stenosis, proximal occlusion, aneurysm, or vascular malformation. Posterior circulation: No significant stenosis, proximal occlusion, aneurysm, or vascular malformation. Venous sinuses: As permitted by contrast timing, patent. Anatomic variants: None Review of the MIP images confirms the above findings IMPRESSION: 1. No acute intracranial abnormality. Unchanged age indeterminate right thalamic small-vessel infarct. 2. No intracranial large vessel occlusion. 3. Severe nearly occlusive stenosis of the origin of the right vertebral artery. Additional scattered regions of mild-to-moderate atherosclerotic narrowing in the V3 and V4 segments of the bilateral vertebral arteries. 4. Air-fluid level in the left maxillary sinus, correlate for symptoms of acute sinusitis. Emphysema (ICD10-J43.9). Electronically Signed   By: Marin Roberts M.D.   On: 05/21/2022 16:59   CT HEAD WO CONTRAST  Result Date: 05/20/2022 CLINICAL DATA:  Left-sided weakness EXAM: CT HEAD WITHOUT CONTRAST TECHNIQUE: Contiguous axial images were obtained from the base of the skull through the vertex without intravenous contrast. RADIATION DOSE REDUCTION: This exam was performed according to the departmental dose-optimization program which includes automated exposure control, adjustment of the mA and/or kV according to patient size and/or use of iterative reconstruction technique. COMPARISON:  None Available. FINDINGS: Brain: There is no mass, hemorrhage or extra-axial collection. There is generalized atrophy without lobar  predilection. Hypodensity of the white matter is most commonly  associated with chronic microvascular disease. 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 Vascular: Atherosclerotic calcification of the vertebral and internal carotid arteries at the skull base. No abnormal hyperdensity of the major intracranial arteries or dural venous sinuses. Skull: The visualized skull base, calvarium and extracranial soft tissues are normal. Sinuses/Orbits: No fluid levels or advanced mucosal thickening of the visualized paranasal sinuses. No mastoid or middle ear effusion. The orbits are normal. IMPRESSION: 1. No acute intracranial abnormality. 2. Generalized atrophy and findings of chronic microvascular disease. 3. 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 Electronically Signed   By: Ulyses Jarred M.D.   On: 05/20/2022 20:12    DISCHARGE EXAMINATION: Vitals:   05/26/22 0009 05/26/22 0307 05/26/22 0741 05/26/22 1119  BP: 134/77 127/76 137/76 (!) 116/93  Pulse: 81 80 78 90  Resp: 16 16 16 18   Temp: 98.2 F (36.8 C) 98.6 F (37 C) 98.3 F (36.8 C) 98 F (36.7 C)  TempSrc: Oral Oral Oral Oral  SpO2: 96% 93% 93% 96%  Weight:      Height:       General appearance: Awake alert.  In no distress Resp: Clear to auscultation bilaterally.  Normal effort Cardio: S1-S2 is normal regular.  No S3-S4.  No rubs murmurs or bruit GI: Abdomen is soft.  Nontender nondistended.  Bowel sounds are present normal.  No masses organomegaly   DISPOSITION: CIR     Current Inpatient Medications: Scheduled:  amiodarone  200 mg Oral Daily   amitriptyline  75 mg Oral QHS   apixaban  5 mg Oral BID   aspirin EC  81 mg Oral Daily   atorvastatin  40 mg Oral QHS   escitalopram  10 mg Oral QHS   fluticasone  2 spray Each Nare Daily   furosemide  20 mg Oral Daily   gabapentin  300 mg Oral TID   insulin aspart  0-15 Units Subcutaneous TID WC   insulin glargine-yfgn  32 Units Subcutaneous QHS    levothyroxine  50 mcg Oral QAC breakfast   magnesium citrate  1 Bottle Oral Once   multivitamin with minerals  1 tablet Oral Daily   polyethylene glycol  17 g Oral Daily   terazosin  5 mg Oral BID   Continuous: [id number] **OR** acetaminophen (TYLENOL) oral liquid 160 mg/5 mL **OR** acetaminophen, cyclobenzaprine, senna-docusate      Follow-up Information     Garvin Fila, MD Follow up.   Specialties: Neurology, Radiology Contact information: [address] [place] [address] [place]: 35 mins  [facility] Pager on [email or web address]  05/26/2022, 12:07 PM

## 2022-05-26 NOTE — H&P (Signed)
Physical Medicine and Rehabilitation Admission H&P    Chief Complaint  Patient presents with   Weakness  : HPI: Grant [name] is a 76 year old right-handed male with history significant for CKD with creatinine baseline 7.58-8.32 chronic systolic congestive heart failure with AICD, A-fib on Eliquis as well as amiodarone, BPH, CAD maintained on low-dose aspirin, diabetes mellitus, tobacco use,  and hyperlipidemia.  Per chart review patient lives with spouse.  1 level home 2 steps to entry.  Independent with a cane prior to admission.  By report patient and his wife and went to [place] for Thanksgiving.  His left shoulder was hurting with throbbing pains.  He noticed numbness along the entire left side of his body.  Each day became a little worse he did not want to go to the hospital initially.  There was reports of a couple of falls.  Patient returned to the area presented 05/20/2022 with increasing left-sided weakness.  Cranial CT scan showed no acute intracranial abnormality.  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  No intracranial large vessel occlusion.  MRI not completed due to AICD.  Admission chemistries unremarkable set potassium 3.3 glucose 214 creatinine 1.32, hemoglobin 10.7, alcohol 15, urine drug screen negative, hemoglobin A1c 9.9.   Echocardiogram ejection fraction of 60 to 65% no wall motion abnormalities grade 1 diastolic dysfunction.  Neurology follow-up films reviewed suspect subacute right thalamic cardioembolic infarct.  He reports his strength is gradually improving. He says he has electric stock type pain in his LUE. Patient currently maintained on low-dose aspirin with Eliquis ongoing at 5 mg twice daily.  Tolerating a regular consistency diet.  Therapy evaluations completed due to patient's left-sided weakness decreased functional mobility was  admitted for a comprehensive rehab program.  Review of Systems  Constitutional:  Negative for diaphoresis and fever.  HENT:  Negative for hearing loss.   Eyes:  Negative for blurred vision and double vision.  Respiratory:  Negative for cough and shortness of breath.   Cardiovascular:  Positive for palpitations. Negative for chest pain and leg swelling.  Gastrointestinal:  Positive for constipation. Negative for heartburn, nausea and vomiting.  Genitourinary:  Positive for urgency.  Musculoskeletal:  Positive for myalgias.       Reportedly a couple of falls over the last 9 days  Skin:  Negative for rash.  Neurological:  Positive for sensory change and weakness.  Psychiatric/Behavioral:  Positive for depression.   All other systems reviewed and are negative.  Past Medical History:  Diagnosis Date   AICD (automatic cardioverter/defibrillator) present    BPH (benign prostatic hyperplasia)    CAD (coronary artery disease)    DM (diabetes mellitus) ([place])    HLD (hyperlipidemia)    Hyperglycemia 03/09/2017   Hypothyroidism    NSVT (nonsustained ventricular tachycardia) ([facility])    Obstructive sleep apnea    Past Surgical History:  Procedure Laterality Date   ABDOMINAL SURGERY  1981   repair lungs, liver, and intenstines from trauma   Family History  Problem Relation Age of Onset   CAD Mother    Hypertension Mother    Cancer - Colon Father    Cancer - Colon Brother    Social History:  reports that he has been smoking cigarettes. He has a 32.00 pack-year smoking history. His smokeless tobacco use includes chew. He reports current alcohol use. He reports  that he does not currently use drugs after having used the following drugs: Marijuana. Allergies:  Allergies  Allergen Reactions   Food Anaphylaxis    LAMB OR GOAT   Lisinopril     Possible cough   Medications Prior to Admission  Medication Sig Dispense Refill   amiodarone (PACERONE) 200 MG tablet Take 200 mg by mouth daily.      apixaban (ELIQUIS) 5 MG TABS tablet Take 5 mg by mouth 2 (two) times daily.     aspirin 81 MG chewable tablet Chew 81 mg by mouth daily.     atorvastatin (LIPITOR) 40 MG tablet Take 40 mg by mouth at bedtime.      cyclobenzaprine (FLEXERIL) 10 MG tablet Take 10 mg by mouth at bedtime.     fluticasone (FLONASE) 50 MCG/ACT nasal spray Place 2 sprays into both nostrils daily. 16 g 2   furosemide (LASIX) 20 MG tablet Take 20-40 mg by mouth See admin instructions. Take 26m by mouth in the morning and 277mby mouth every evening.     gabapentin (NEURONTIN) 300 MG capsule Take 300 mg by mouth at bedtime.     insulin glargine (LANTUS) 100 UNIT/ML injection Inject 32 Units into the skin at bedtime.     levothyroxine (SYNTHROID, LEVOTHROID) 50 MCG tablet Take 50 mcg by mouth daily before breakfast.     losartan (COZAAR) 100 MG tablet Take 50 mg by mouth daily.     metoprolol (TOPROL-XL) 200 MG 24 hr tablet Take 200 mg by mouth daily.     nitroGLYCERIN (NITROSTAT) 0.4 MG SL tablet Place 0.4 mg under the tongue every 5 (five) minutes as needed for chest pain.     omeprazole (PRILOSEC) 40 MG capsule Take 40 mg by mouth 2 (two) times daily.     Semaglutide, 1 MG/DOSE, (OZEMPIC, 1 MG/DOSE,) 2 MG/1.5ML SOPN Inject 1 mg into the skin once a week.     blood glucose meter kit and supplies KIT Dispense based on patient and insurance preference. Use up to four times daily as directed. (FOR ICD-9 250.00, 250.01). 1 each 0   diclofenac Sodium (VOLTAREN) 1 % GEL Apply 4 g topically 4 (four) times daily. For foot pain (Patient not taking: Reported on 05/24/2022) 150 g 1   insulin aspart (NOVOLOG) 100 UNIT/ML injection Inject 0-15 Units into the skin 3 (three) times daily with meals. 10 mL 11   Insulin Syringe-Needle U-100 (INSULIN SYRINGE .5CC/31GX5/16") 31G X 5/16" 0.5 ML MISC 16 Units by Does not apply route at bedtime. 200 each 0   ketorolac (TORADOL) 10 MG tablet Take 1 tablet (10 mg total) by mouth every 8 (eight)  hours as needed. (Patient not taking: Reported on 05/24/2022) 15 tablet 0   terazosin (HYTRIN) 5 MG capsule Take 1 capsule (5 mg total) by mouth 2 (two) times daily. (Patient not taking: Reported on 05/24/2022) 180 capsule 3      Home: Home Living Family/patient expects to be discharged to:: Private residence Living Arrangements: Spouse/significant other Available Help at Discharge: Family, Available 24 hours/day Type of Home: House Home Access: Stairs to enter [employer] Steps: 2 Entrance Stairs-Rails: Right Home Layout: One level Bathroom Shower/Tub: WaMultimedia programmerStandard Bathroom Accessibility: Yes Home Equipment: Grab bars - tub/shower, CaRadio producer single point  Lives With: Spouse   Functional History: Prior Function Prior Level of Function : Independent/Modified Independent Mobility Comments: Could ambulate in community; uses cane in community and in house either cane or furniture;  reports he always wears his cowboy boots ADLs Comments: independent adls; pt and wife share IADLs  Functional Status:  Mobility: Bed Mobility Overal bed mobility: Needs Assistance Bed Mobility: Supine to Sit Supine to sit: HOB elevated, Min assist Sit to supine: HOB elevated, Min guard General bed mobility comments: pt OOB in recliner pre and post session Transfers Overall transfer level: Needs assistance Equipment used: Rolling walker (2 wheels) Transfers: Sit to/from Stand, Bed to chair/wheelchair/BSC Sit to Stand: Mod assist Bed to/from chair/wheelchair/BSC transfer type:: Stand pivot Stand pivot transfers: Mod assist General transfer comment: Pt mod assist to get to standing today.  able to repeat x5 reps throughout session Ambulation/Gait Ambulation/Gait assistance: Mod assist Gait Distance (Feet): 10 Feet Assistive device: Rolling walker (2 wheels) Gait Pattern/deviations: Decreased stride length, Decreased stance time - left, Step-through pattern,  Step-to pattern, Decreased step length - left, Decreased weight shift to right General Gait Details: gait deferred as pt unable to maintain standing balance this session needing max cues for anterior weight shift, pt blocking backs of legs on reclienr to maintain standing Gait velocity: decreased    ADL: ADL Overall ADL's : Needs assistance/impaired Eating/Feeding: Set up, Sitting Eating/Feeding Details (indicate cue type and reason): Attempted to have pt cut own food but unable to use L hand effectively. Pt with decreased coordination, strength and proprioception of the LUE. Grooming: Wash/dry hands, Wash/dry face, Set up, Sitting Grooming Details (indicate cue type and reason): Pt sat on EOB with supervision to complete basic grooming tasks. Upper Body Bathing: Moderate assistance, Sitting, Cueing for sequencing, Cueing for compensatory techniques Upper Body Bathing Details (indicate cue type and reason): Pt very uncoordinated with LUE making bathing difficult.  Pt drops items regularly with LUE. Lower Body Bathing: Moderate assistance, Sit to/from stand, Sitting/lateral leans, +2 for safety/equipment, Cueing for safety Upper Body Dressing : Minimal assistance, Sitting, Cueing for sequencing Lower Body Dressing: Moderate assistance, Maximal assistance, +2 for safety/equipment, Cueing for safety, Cueing for sequencing, Sit to/from stand, Sitting/lateral leans Lower Body Dressing Details (indicate cue type and reason): Moderate assist sitting. Maximum assist standing. Toilet Transfer: Moderate assistance, Rolling walker (2 wheels), BSC/3in1, Ambulation Toilet Transfer Details (indicate cue type and reason): Pt required mod assist to shift weight to balls of feet when first up and continued mod assist  to transfer to chair. Pt with posterior lean during transfer. If therapist not there, pt would have fallen posteriorly.  Pt with little insight to this posterior lean which makes pt a fall  risk. Toileting- Clothing Manipulation and Hygiene: Moderate assistance, Sit to/from stand, Sitting/lateral lean Toileting - Clothing Manipulation Details (indicate cue type and reason): Based on general assessment. Functional mobility during ADLs: Moderate assistance, Rolling walker (2 wheels) General ADL Comments: Would need +2 if pt moved any further than from bed to chair. Pt did not get up all weekend and was weak getting to his feet and felt unstable in standing.  Cognition: Cognition Overall Cognitive Status: Impaired/Different from baseline Arousal/Alertness: Awake/alert Orientation Level: Oriented X4 Attention: Selective Selective Attention: Impaired Selective Attention Impairment: Verbal complex Memory: Impaired Memory Impairment: Decreased recall of new information Problem Solving: Appears intact Cognition Arousal/Alertness: Awake/alert Behavior During Therapy: WFL for tasks assessed/performed Overall Cognitive Status: Impaired/Different from baseline Area of Impairment: Awareness, Following commands, Safety/judgement, Problem solving Following Commands: Follows multi-step commands inconsistently, Follows multi-step commands with increased time Safety/Judgement: Decreased awareness of safety, Decreased awareness of deficits Awareness: Intellectual Problem Solving: Difficulty sequencing, Requires verbal cues, Requires tactile cues General Comments:  Pt with little insight into the severity of his deficits which makes going straight home a safety concern.  Physical Exam: Blood pressure 127/76, pulse 80, temperature 98.6 F (37 C), temperature source Oral, resp. rate 16, height _0  (1.88 m), weight 99.8 kg, SpO2 93 %.  General: No apparent distress, eating lunch HEENT: Head is normocephalic, atraumatic, sclera anicteric, oral mucosa pink and moist, dentition intact, ext ear canals clear, wearing glasses Neck: Supple without JVD or lymphadenopathy Heart: Reg rate and  rhythm. No murmurs rubs or gallops Chest: CTA bilaterally without wheezes, rales, or rhonchi; no distress Abdomen: Soft, non-tender, non-distended, bowel sounds positive. Extremities: No clubbing, cyanosis, or edema. Pulses are 2+ Psych: Pt's affect is appropriate. Pt is cooperative Skin: Clean and intact without signs of breakdown Neuro:  Patient is alert.  Oriented to person,  place, year, month, not date, Makes eye contact with examiner.  Follows simple commands.  Dysarthria present.  Able to name 3 objects.  Able to repeat. Provides name and age but does display some difficulty with short-term recall.  Left inattention. Looks more to right can cross midline.  CN 2-12 intact other than mild L facial weakness, facial sensation altered on left.  Strength LUE 3/5 proximal 4-/5 distal LLE 3-4/5 RLE 4+/5 RUE 4+/5 Alerted sensation LUE and LLE FTN intact on right Musculoskeletal: No joint swelling or tenderness noted. No abnormal tone noted.   Results for orders placed or performed during the hospital encounter of 05/20/22 (from the past 48 hour(s))  Glucose, capillary     Status: Abnormal   Collection Time: 05/24/22  6:42 AM  Result Value Ref Range   Glucose-Capillary 179 (H) 70 - 99 mg/dL    Comment: Glucose reference range applies only to samples taken after fasting for at least 8 hours.   Comment 1 Notify RN   Glucose, capillary     Status: Abnormal   Collection Time: 05/24/22 12:49 PM  Result Value Ref Range   Glucose-Capillary 282 (H) 70 - 99 mg/dL    Comment: Glucose reference range applies only to samples taken after fasting for at least 8 hours.   Comment 1 Notify RN    Comment 2 Document in Chart   Glucose, capillary     Status: Abnormal   Collection Time: 05/24/22  4:11 PM  Result Value Ref Range   Glucose-Capillary 166 (H) 70 - 99 mg/dL    Comment: Glucose reference range applies only to samples taken after fasting for at least 8 hours.   Comment 1 Notify RN    Comment 2  Document in Chart   Glucose, capillary     Status: Abnormal   Collection Time: 05/24/22  9:31 PM  Result Value Ref Range   Glucose-Capillary 148 (H) 70 - 99 mg/dL    Comment: Glucose reference range applies only to samples taken after fasting for at least 8 hours.  Glucose, capillary     Status: Abnormal   Collection Time: 05/25/22  6:10 AM  Result Value Ref Range   Glucose-Capillary 145 (H) 70 - 99 mg/dL    Comment: Glucose reference range applies only to samples taken after fasting for at least 8 hours.  CBC     Status: Abnormal   Collection Time: 05/25/22  6:51 AM  Result Value Ref Range   WBC 6.8 4.0 - 10.5 K/uL   RBC 3.98 (L) 4.22 - 5.81 MIL/uL   Hemoglobin 11.8 (L) 13.0 - 17.0 g/dL   HCT 36.1 (L) 39.0 -  52.0 %   MCV 90.7 80.0 - 100.0 fL   MCH 29.6 26.0 - 34.0 pg   MCHC 32.7 30.0 - 36.0 g/dL   RDW 12.5 11.5 - 15.5 %   Platelets 113 (L) 150 - 400 K/uL   nRBC 0.0 0.0 - 0.2 %    Comment: Performed at [facility], [place] [address], [place], [place] [address]  Basic metabolic panel     Status: Abnormal   Collection Time: 05/25/22  6:51 AM  Result Value Ref Range   Sodium 136 135 - 145 mmol/L   Potassium 3.5 3.5 - 5.1 mmol/L   Chloride 103 98 - 111 mmol/L   CO2 23 22 - 32 mmol/L   Glucose, Bld 164 (H) 70 - 99 mg/dL    Comment: Glucose reference range applies only to samples taken after fasting for at least 8 hours.   BUN 9 8 - 23 mg/dL   Creatinine, Ser 1.29 (H) 0.61 - 1.24 mg/dL   Calcium 8.7 (L) 8.9 - 10.3 mg/dL   GFR, Estimated 57 (L) >60 mL/min    Comment: (NOTE) Calculated using the CKD-EPI Creatinine Equation (2021)    Anion gap 10 5 - 15    Comment: Performed at [facility] [address] [place], [place] [address]  Glucose, capillary     Status: Abnormal   Collection Time: 05/25/22 12:12 PM  Result Value Ref Range   Glucose-Capillary 187 (H) 70 - 99 mg/dL    Comment: Glucose reference range applies only to samples taken after fasting for at least 8  hours.  Glucose, capillary     Status: Abnormal   Collection Time: 05/25/22  4:19 PM  Result Value Ref Range   Glucose-Capillary 180 (H) 70 - 99 mg/dL    Comment: Glucose reference range applies only to samples taken after fasting for at least 8 hours.  Glucose, capillary     Status: Abnormal   Collection Time: 05/25/22  9:29 PM  Result Value Ref Range   Glucose-Capillary 208 (H) 70 - 99 mg/dL    Comment: Glucose reference range applies only to samples taken after fasting for at least 8 hours.   No results found.    Blood pressure 127/76, pulse 80, temperature 98.6 F (37 C), temperature source Oral, resp. rate 16, height _0  (1.88 m), weight 99.8 kg, SpO2 93 %.  Medical Problem List and Plan: 1. Functional deficits secondary to subacute right MCA ischemic infarction  -patient may  shower  -ELOS/Goals:  10 to 14 days , mod I to sup with PT/OT, sup with SLP  -Admit to CIR 2.  Antithrombotics: -DVT/anticoagulation:  Pharmaceutical: Eliquis  -antiplatelet therapy: Aspirin 81 mg daily 3. Pain Management: Neurontin 300 mg 3 times daily, Elavil 40m nightly, Flexeril 10 mg nightly as needed  -Consider increase dose neuronin  for neuropathic pain 4. Mood/Behavior/Sleep: Lexapro 10 mg nightly, Elavil 75 mg nightly.  Provide emotional support  -antipsychotic agents: N/A 5. Neuropsych/cognition: This patient is capable of making decisions on his own behalf. 6. Skin/Wound Care: Routine skin checks 7. Fluids/Electrolytes/Nutrition: Routine in and outs with follow-up chemistries 8.  Atrial fibrillation/AICD.  Continue amiodarone 200 mg daily as well as Eliquis.  Cardiac rate controlled 9.  Diabetes mellitus.  Hemoglobin A1c 9.9.  Semglee 32 units nightly.  SSI. Check blood sugars before meals and at bedtime 10.  Hyperlipidemia.  Lipitor 491mdaily 11.  Chronic systolic congestive heart failure.  Monitor for any signs of  fluid overload. On Furosemide 58m daily.  12.  BPH.  Hytrin 5 mg  twice daily 13.  Tobacco use.  Provide counseling 14.  Hypothyroidism.  Synthroid 15.  CKD.  Baseline creatinine 1.43-1.94.  Follow-up chemistries 16. HTN. Long term goal normotensive. Home medications metoprolol and cozaar currently on hold. Monitor with activity. 17. Hypothyroidism. Continue synthroid   DCathlyn Parsons PA-C 05/26/2022

## 2022-05-26 NOTE — Progress Notes (Signed)
Physical Therapy Treatment Patient Details Name: Grant [name] MRN: [id number] DOB: July 31, 1945 Today's Date: 05/26/2022   History of Present Illness Pt is 76 yo male admitted on 11/14/21 with L sided weakness for 9 days but was out of town so waited to come to hospital until returned home.  CT was negative for acute stroke and pt unable to have MRI.  Pt reports a fall onto his LT shoulder and hand ~2 months ago with LT wrist and LT shoulder pain since that time. He denies getting any x-rays: "I don't like to go to the doctor." Pt with hx of afib, AICD, dm, CAD, HLD.    PT Comments    Pt with incremental progress this session however continues to be limited by impaired balance/postural reactions, weakness and ataxia. Pt needing hand over hand cues throughout all mobility as pt with noted motor planning/ initiation difficulties. Pt able to come to stand with mod-max assist to RW and side step to [facility] however pt abruptly sitting after each step and with jerky movements of LUE and bil Les throughout. Transfer to recliner deferred as pt preparing to transfer to 4W/M. Pt continues to benefit from skilled PT services to progress toward functional mobility goals.    Recommendations for follow up therapy are one component of a multi-disciplinary discharge planning process, led by the attending physician.  Recommendations may be updated based on patient status, additional functional criteria and insurance authorization.  Follow Up Recommendations  Acute inpatient rehab (3hours/day)     Assistance Recommended at Discharge Intermittent Supervision/Assistance  Patient can return home with the following A little help with bathing/dressing/bathroom;Assistance with cooking/housework;Help with stairs or ramp for entrance;A lot of help with walking and/or transfers   Equipment Recommendations  Rolling walker (2 wheels)    Recommendations for Other Services Rehab consult     Precautions / Restrictions  Precautions Precautions: Fall Precaution Comments: LT wrist pain with WB (fall 2 months ago on hand. No imaging) Restrictions Weight Bearing Restrictions: No     Mobility  Bed Mobility Overal bed mobility: Needs Assistance Bed Mobility: Supine to Sit, Sit to Supine     Supine to sit: HOB elevated, Mod assist Sit to supine: Mod assist   General bed mobility comments: mod assist for sequencing to EOB with repeated cues needed with pt following commands acurately ~50% of time    Transfers Overall transfer level: Needs assistance Equipment used: Rolling walker (2 wheels) Transfers: Sit to/from Stand, Bed to chair/wheelchair/BSC Sit to Stand: Mod assist           General transfer comment: Pt mod assist to get to standing today.  able to repeat x4 reps throughout session, strong posterior lean, able to side step one step at a time toward [facility] with pt abruptly sitting between each step    Ambulation/Gait                   Stairs             Wheelchair Mobility    Modified Rankin (Stroke Patients Only) Modified Rankin (Stroke Patients Only) Pre-Morbid Rankin Score: No significant disability Modified Rankin: Moderately severe disability     Balance Overall balance assessment: History of Falls, Needs assistance (daily falls) Sitting-balance support: Single extremity supported Sitting balance-Leahy Scale: Fair Sitting balance - Comments: Posterior LOB when raises UEs (Pt has hard time keeping bil feet on the floor due to proprioceptive deficits.) Postural control: Posterior lean Standing balance support: Bilateral upper extremity  supported, During functional activity, Reliant on assistive device for balance Standing balance-Leahy Scale: Poor Standing balance comment: Pt unable to maintain standing balance without mod assist                            Cognition Arousal/Alertness: Awake/alert Behavior During Therapy: WFL for tasks  assessed/performed Overall Cognitive Status: Impaired/Different from baseline Area of Impairment: Awareness, Following commands, Safety/judgement, Problem solving                       Following Commands: Follows multi-step commands inconsistently, Follows multi-step commands with increased time Safety/Judgement: Decreased awareness of safety, Decreased awareness of deficits Awareness: Intellectual Problem Solving: Difficulty sequencing, Requires verbal cues, Requires tactile cues General Comments: Pt with little insight into the severity of his deficits        Exercises      General Comments General comments (skin integrity, edema, etc.): pt with continued difficulty motor planning and seqnecning mobility despite repeated cues, demonstration, and hand over hand cueing (pt reaching for rail on R when prompted to reach across body toward this PTA with PTA extending hand out)      Pertinent Vitals/Pain Pain Assessment Pain Assessment: Faces Faces Pain Scale: No hurt    Home Living                          Prior Function            PT Goals (current goals can now be found in the care plan section) Acute Rehab PT Goals PT Goal Formulation: With patient/family Time For Goal Achievement: 06/04/22 Potential to Achieve Goals: Good    Frequency    Min 4X/week      PT Plan Discharge plan needs to be updated    Co-evaluation              AM-PAC PT "6 Clicks" Mobility   Outcome Measure  Help needed turning from your back to your side while in a flat bed without using bedrails?: None Help needed moving from lying on your back to sitting on the side of a flat bed without using bedrails?: A Little Help needed moving to and from a bed to a chair (including a wheelchair)?: A Little Help needed standing up from a chair using your arms (e.g., wheelchair or bedside chair)?: A Lot Help needed to walk in hospital room?: Total Help needed climbing 3-5 steps  with a railing? : Total 6 Click Score: 14    End of Session Equipment Utilized During Treatment: Gait belt Activity Tolerance: Patient limited by fatigue Patient left: in chair;with call bell/phone within reach;with chair alarm set Nurse Communication: Mobility status PT Visit Diagnosis: Other abnormalities of gait and mobility (R26.89);Hemiplegia and hemiparesis Hemiplegia - Right/Left: Left Hemiplegia - dominant/non-dominant: Non-dominant Hemiplegia - caused by: Cerebral infarction     Time: 6063-0160 PT Time Calculation (min) (ACUTE ONLY): 14 min  Charges:  $Therapeutic Activity: 8-22 mins                    Carmisha Larusso R. PTA Acute Rehabilitation Services Office: [phone]    Catalina Antigua 05/26/2022, 4:45 PM

## 2022-05-26 NOTE — Progress Notes (Incomplete)
Inpatient Rehabilitation Admission Medication Review by a Pharmacist  A complete drug regimen review was completed for this patient to identify any potential clinically significant medication issues.  High Risk Drug Classes Is patient taking? Indication by Medication  Antipsychotic {Receiving?:26196}   Anticoagulant {Receiving?:26196} Apixaban - afib, stroke ppx  Antibiotic {Receiving?:26196}   Opioid {Receiving?:26196}   Antiplatelet {Receiving?:26196} ASA - stroke ppx, CAD  Hypoglycemics/insulin {Yes or No?:26198} Insulin glargine, aspart - DM  Vasoactive Medication {Receiving?:26196} Amiodarone - afib Terazosin - BPH  Chemotherapy {Receiving Chemo?:26197}   Other {Yes or No?:26198} Amitriptyline - mood/migraine ppx Atorvastatin - HLD Cyclobenzaprine - PRN muscle spasms *** Diclofenac - pain *** Escitalopram - mood Fluticasone - allergies Furosemide - HF Gabapentin - neuropathy/pain Levothyroxine - hypothyroid MVI - supplement Miralax, Senokot-S - constipation     Type of Medication Issue Identified Description of Issue Recommendation(s)  Drug Interaction(s) (clinically significant)     Duplicate Therapy     Allergy     No Medication Administration End Date     Incorrect Dose     Additional Drug Therapy Needed     Significant med changes from prior encounter (inform family/care partners about these prior to discharge). Semaglutide, nitroglycerin, metoprolol, ketorolac PO, omeprazole not resumed (NF) Communicate medication changes with patient/family at discharge  Other       Clinically significant medication issues were identified that warrant physician communication and completion of prescribed/recommended actions by midnight of the next day:  {Yes or No?:26198}  Name of provider notified for urgent issues identified: ***   Provider Method of Notification: ***    Pharmacist comments: ***   Time spent performing this drug regimen review (minutes):  20   Thank you for allowing pharmacy to be a part of this patient's care.  Thelma Barge, PharmD Clinical Pharmacist

## 2022-05-27 DIAGNOSIS — I63511 Cerebral infarction due to unspecified occlusion or stenosis of right middle cerebral artery: Secondary | ICD-10-CM | POA: Diagnosis not present

## 2022-05-27 LAB — CBC WITH DIFFERENTIAL/PLATELET
Abs Immature Granulocytes: 0.08 10*3/uL — ABNORMAL HIGH (ref 0.00–0.07)
Basophils Absolute: 0.1 10*3/uL (ref 0.0–0.1)
Basophils Relative: 1 %
Eosinophils Absolute: 0.1 10*3/uL (ref 0.0–0.5)
Eosinophils Relative: 2 %
HCT: 36 % — ABNORMAL LOW (ref 39.0–52.0)
Hemoglobin: 11.9 g/dL — ABNORMAL LOW (ref 13.0–17.0)
Immature Granulocytes: 1 %
Lymphocytes Relative: 23 %
Lymphs Abs: 1.3 10*3/uL (ref 0.7–4.0)
MCH: 29.9 pg (ref 26.0–34.0)
MCHC: 33.1 g/dL (ref 30.0–36.0)
MCV: 90.5 fL (ref 80.0–100.0)
Monocytes Absolute: 0.6 10*3/uL (ref 0.1–1.0)
Monocytes Relative: 11 %
Neutro Abs: 3.6 10*3/uL (ref 1.7–7.7)
Neutrophils Relative %: 62 %
Platelets: 134 10*3/uL — ABNORMAL LOW (ref 150–400)
RBC: 3.98 MIL/uL — ABNORMAL LOW (ref 4.22–5.81)
RDW: 12.5 % (ref 11.5–15.5)
WBC: 5.8 10*3/uL (ref 4.0–10.5)
nRBC: 0 % (ref 0.0–0.2)

## 2022-05-27 LAB — COMPREHENSIVE METABOLIC PANEL
ALT: 18 U/L (ref 0–44)
AST: 17 U/L (ref 15–41)
Albumin: 3.1 g/dL — ABNORMAL LOW (ref 3.5–5.0)
Alkaline Phosphatase: 36 U/L — ABNORMAL LOW (ref 38–126)
Anion gap: 7 (ref 5–15)
BUN: 9 mg/dL (ref 8–23)
CO2: 27 mmol/L (ref 22–32)
Calcium: 8.9 mg/dL (ref 8.9–10.3)
Chloride: 105 mmol/L (ref 98–111)
Creatinine, Ser: 1.45 mg/dL — ABNORMAL HIGH (ref 0.61–1.24)
GFR, Estimated: 50 mL/min — ABNORMAL LOW (ref >60)
Glucose, Bld: 151 mg/dL — ABNORMAL HIGH (ref 70–99)
Potassium: 3.8 mmol/L (ref 3.5–5.1)
Sodium: 139 mmol/L (ref 135–145)
Total Bilirubin: 0.8 mg/dL (ref 0.3–1.2)
Total Protein: 5.9 g/dL — ABNORMAL LOW (ref 6.5–8.1)

## 2022-05-27 LAB — GLUCOSE, CAPILLARY
Glucose-Capillary: 159 mg/dL — ABNORMAL HIGH (ref 70–99)
Glucose-Capillary: 144 mg/dL — ABNORMAL HIGH (ref 70–99)
Glucose-Capillary: 173 mg/dL — ABNORMAL HIGH (ref 70–99)
Glucose-Capillary: 163 mg/dL — ABNORMAL HIGH (ref 70–99)

## 2022-05-27 NOTE — H&P (Signed)
Physical Medicine and Rehabilitation Admission H&P        Chief Complaint  Patient presents with   Weakness  : HPI: Grant [name] is a 76 year old right-handed male with history significant for CKD with creatinine baseline 9.48-5.46 chronic systolic congestive heart failure with AICD, A-fib on Eliquis as well as amiodarone, BPH, CAD maintained on low-dose aspirin, diabetes mellitus, tobacco use,  and hyperlipidemia.  Per chart review patient lives with spouse.  1 level home 2 steps to entry.  Independent with a cane prior to admission.  By report patient and his wife and went to [place] for Thanksgiving.  His left shoulder was hurting with throbbing pains.  He noticed numbness along the entire left side of his body.  Each day became a little worse he did not want to go to the hospital initially.  There was reports of a couple of falls.  Patient returned to the area presented 05/20/2022 with increasing left-sided weakness.  Cranial CT scan showed no acute intracranial abnormality.  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  No intracranial large vessel occlusion.  MRI not completed due to AICD.  Admission chemistries unremarkable set potassium 3.3 glucose 214 creatinine 1.32, hemoglobin 10.7, alcohol 15, urine drug screen negative, hemoglobin A1c 9.9.   Echocardiogram ejection fraction of 60 to 65% no wall motion abnormalities grade 1 diastolic dysfunction.  Neurology follow-up films reviewed suspect subacute right thalamic cardioembolic infarct.  He reports his strength is gradually improving. He says he has electric stock type pain in his LUE. Patient currently maintained on low-dose aspirin with Eliquis ongoing at 5 mg twice daily.  Tolerating a regular consistency diet.  Therapy evaluations completed due to patient's left-sided weakness decreased functional  mobility was admitted for a comprehensive rehab program.   Review of Systems  Constitutional:  Negative for diaphoresis and fever.  HENT:  Negative for hearing loss.   Eyes:  Negative for blurred vision and double vision.  Respiratory:  Negative for cough and shortness of breath.   Cardiovascular:  Positive for palpitations. Negative for chest pain and leg swelling.  Gastrointestinal:  Positive for constipation. Negative for heartburn, nausea and vomiting.  Genitourinary:  Positive for urgency.  Musculoskeletal:  Positive for myalgias.       Reportedly a couple of falls over the last 9 days  Skin:  Negative for rash.  Neurological:  Positive for sensory change and weakness.  Psychiatric/Behavioral:  Positive for depression.   All other systems reviewed and are negative.       Past Medical History:  Diagnosis Date   AICD (automatic cardioverter/defibrillator) present     BPH (benign prostatic hyperplasia)     CAD (coronary artery disease)     DM (diabetes mellitus) ([facility])     HLD (hyperlipidemia)     Hyperglycemia 03/09/2017   Hypothyroidism     NSVT (nonsustained ventricular tachycardia) ([facility])     Obstructive sleep apnea           Past Surgical History:  Procedure Laterality Date   ABDOMINAL SURGERY   1981    repair lungs, liver, and intenstines from trauma         Family History  Problem Relation Age of Onset   CAD Mother     Hypertension Mother     Cancer - Colon Father  Cancer - Colon Brother      Social History:  reports that he has been smoking cigarettes. He has a 32.00 pack-year smoking history. His smokeless tobacco use includes chew. He reports current alcohol use. He reports that he does not currently use drugs after having used the following drugs: Marijuana. Allergies:       Allergies  Allergen Reactions   Food Anaphylaxis      LAMB OR GOAT   Lisinopril        Possible cough          Medications Prior to Admission  Medication Sig Dispense Refill    amiodarone (PACERONE) 200 MG tablet Take 200 mg by mouth daily.       apixaban (ELIQUIS) 5 MG TABS tablet Take 5 mg by mouth 2 (two) times daily.       aspirin 81 MG chewable tablet Chew 81 mg by mouth daily.       atorvastatin (LIPITOR) 40 MG tablet Take 40 mg by mouth at bedtime.        cyclobenzaprine (FLEXERIL) 10 MG tablet Take 10 mg by mouth at bedtime.       fluticasone (FLONASE) 50 MCG/ACT nasal spray Place 2 sprays into both nostrils daily. 16 g 2   furosemide (LASIX) 20 MG tablet Take 20-40 mg by mouth See admin instructions. Take 44m by mouth in the morning and 266mby mouth every evening.       gabapentin (NEURONTIN) 300 MG capsule Take 300 mg by mouth at bedtime.       insulin glargine (LANTUS) 100 UNIT/ML injection Inject 32 Units into the skin at bedtime.       levothyroxine (SYNTHROID, LEVOTHROID) 50 MCG tablet Take 50 mcg by mouth daily before breakfast.       losartan (COZAAR) 100 MG tablet Take 50 mg by mouth daily.       metoprolol (TOPROL-XL) 200 MG 24 hr tablet Take 200 mg by mouth daily.       nitroGLYCERIN (NITROSTAT) 0.4 MG SL tablet Place 0.4 mg under the tongue every 5 (five) minutes as needed for chest pain.       omeprazole (PRILOSEC) 40 MG capsule Take 40 mg by mouth 2 (two) times daily.       Semaglutide, 1 MG/DOSE, (OZEMPIC, 1 MG/DOSE,) 2 MG/1.5ML SOPN Inject 1 mg into the skin once a week.       blood glucose meter kit and supplies KIT Dispense based on patient and insurance preference. Use up to four times daily as directed. (FOR ICD-9 250.00, 250.01). 1 each 0   diclofenac Sodium (VOLTAREN) 1 % GEL Apply 4 g topically 4 (four) times daily. For foot pain (Patient not taking: Reported on 05/24/2022) 150 g 1   insulin aspart (NOVOLOG) 100 UNIT/ML injection Inject 0-15 Units into the skin 3 (three) times daily with meals. 10 mL 11   Insulin Syringe-Needle U-100 (INSULIN SYRINGE .5CC/31GX5/16") 31G X 5/16" 0.5 ML MISC 16 Units by Does not apply route at bedtime. 200  each 0   ketorolac (TORADOL) 10 MG tablet Take 1 tablet (10 mg total) by mouth every 8 (eight) hours as needed. (Patient not taking: Reported on 05/24/2022) 15 tablet 0   terazosin (HYTRIN) 5 MG capsule Take 1 capsule (5 mg total) by mouth 2 (two) times daily. (Patient not taking: Reported on 05/24/2022) 180 capsule 3          Home: Home Living Family/patient expects to be discharged to:: Private  residence Living Arrangements: Spouse/significant other Available Help at Discharge: Family, Available 24 hours/day Type of Home: House Home Access: Stairs to enter [employer] of Steps: 2 Entrance Stairs-Rails: Right Home Layout: One level Bathroom Shower/Tub: Multimedia programmer: Standard Bathroom Accessibility: Yes Home Equipment: Grab bars - tub/shower, Radio producer - single point  Lives With: Spouse   Functional History: Prior Function Prior Level of Function : Independent/Modified Independent Mobility Comments: Could ambulate in community; uses cane in community and in house either cane or furniture; reports he always wears his cowboy boots ADLs Comments: independent adls; pt and wife share IADLs   Functional Status:  Mobility: Bed Mobility Overal bed mobility: Needs Assistance Bed Mobility: Supine to Sit Supine to sit: HOB elevated, Min assist Sit to supine: HOB elevated, Min guard General bed mobility comments: pt OOB in recliner pre and post session Transfers Overall transfer level: Needs assistance Equipment used: Rolling walker (2 wheels) Transfers: Sit to/from Stand, Bed to chair/wheelchair/BSC Sit to Stand: Mod assist Bed to/from chair/wheelchair/BSC transfer type:: Stand pivot Stand pivot transfers: Mod assist General transfer comment: Pt mod assist to get to standing today.  able to repeat x5 reps throughout session Ambulation/Gait Ambulation/Gait assistance: Mod assist Gait Distance (Feet): 10 Feet Assistive device: Rolling walker (2 wheels) Gait  Pattern/deviations: Decreased stride length, Decreased stance time - left, Step-through pattern, Step-to pattern, Decreased step length - left, Decreased weight shift to right General Gait Details: gait deferred as pt unable to maintain standing balance this session needing max cues for anterior weight shift, pt blocking backs of legs on reclienr to maintain standing Gait velocity: decreased   ADL: ADL Overall ADL's : Needs assistance/impaired Eating/Feeding: Set up, Sitting Eating/Feeding Details (indicate cue type and reason): Attempted to have pt cut own food but unable to use L hand effectively. Pt with decreased coordination, strength and proprioception of the LUE. Grooming: Wash/dry hands, Wash/dry face, Set up, Sitting Grooming Details (indicate cue type and reason): Pt sat on EOB with supervision to complete basic grooming tasks. Upper Body Bathing: Moderate assistance, Sitting, Cueing for sequencing, Cueing for compensatory techniques Upper Body Bathing Details (indicate cue type and reason): Pt very uncoordinated with LUE making bathing difficult.  Pt drops items regularly with LUE. Lower Body Bathing: Moderate assistance, Sit to/from stand, Sitting/lateral leans, +2 for safety/equipment, Cueing for safety Upper Body Dressing : Minimal assistance, Sitting, Cueing for sequencing Lower Body Dressing: Moderate assistance, Maximal assistance, +2 for safety/equipment, Cueing for safety, Cueing for sequencing, Sit to/from stand, Sitting/lateral leans Lower Body Dressing Details (indicate cue type and reason): Moderate assist sitting. Maximum assist standing. Toilet Transfer: Moderate assistance, Rolling walker (2 wheels), BSC/3in1, Ambulation Toilet Transfer Details (indicate cue type and reason): Pt required mod assist to shift weight to balls of feet when first up and continued mod assist  to transfer to chair. Pt with posterior lean during transfer. If therapist not there, pt would have  fallen posteriorly.  Pt with little insight to this posterior lean which makes pt a fall risk. Toileting- Clothing Manipulation and Hygiene: Moderate assistance, Sit to/from stand, Sitting/lateral lean Toileting - Clothing Manipulation Details (indicate cue type and reason): Based on general assessment. Functional mobility during ADLs: Moderate assistance, Rolling walker (2 wheels) General ADL Comments: Would need +2 if pt moved any further than from bed to chair. Pt did not get up all weekend and was weak getting to his feet and felt unstable in standing.   Cognition: Cognition Overall Cognitive Status: Impaired/Different from baseline  Arousal/Alertness: Awake/alert Orientation Level: Oriented X4 Attention: Selective Selective Attention: Impaired Selective Attention Impairment: Verbal complex Memory: Impaired Memory Impairment: Decreased recall of new information Problem Solving: Appears intact Cognition Arousal/Alertness: Awake/alert Behavior During Therapy: WFL for tasks assessed/performed Overall Cognitive Status: Impaired/Different from baseline Area of Impairment: Awareness, Following commands, Safety/judgement, Problem solving Following Commands: Follows multi-step commands inconsistently, Follows multi-step commands with increased time Safety/Judgement: Decreased awareness of safety, Decreased awareness of deficits Awareness: Intellectual Problem Solving: Difficulty sequencing, Requires verbal cues, Requires tactile cues General Comments: Pt with little insight into the severity of his deficits which makes going straight home a safety concern.   Physical Exam: Blood pressure 127/76, pulse 80, temperature 98.6 F (37 C), temperature source Oral, resp. rate 16, height _0  (1.88 m), weight 99.8 kg, SpO2 93 %.   General: No apparent distress, eating lunch HEENT: Head is normocephalic, atraumatic, sclera anicteric, oral mucosa pink and moist, dentition intact, ext ear canals  clear, wearing glasses Neck: Supple without JVD or lymphadenopathy Heart: Reg rate and rhythm. No murmurs rubs or gallops Chest: CTA bilaterally without wheezes, rales, or rhonchi; no distress Abdomen: Soft, non-tender, non-distended, bowel sounds positive. Extremities: No clubbing, cyanosis, or edema. Pulses are 2+ Psych: Pt's affect is appropriate. Pt is cooperative Skin: Clean and intact without signs of breakdown Neuro:  Patient is alert.  Oriented to person,  place, year, month, not date, Makes eye contact with examiner.  Follows simple commands.  Dysarthria present.  Able to name 3 objects.  Able to repeat. Provides name and age but does display some difficulty with short-term recall.  Left inattention. Looks more to right can cross midline.  CN 2-12 intact other than mild L facial weakness, facial sensation altered on left.  Strength LUE 3/5 proximal 4-/5 distal LLE 3-4/5 RLE 4+/5 RUE 4+/5 Alerted sensation LUE and LLE FTN intact on right Musculoskeletal: No joint swelling or tenderness noted. No abnormal tone noted.     Lab Results Last 48 Hours        Results for orders placed or performed during the hospital encounter of 05/20/22 (from the past 48 hour(s))  Glucose, capillary     Status: Abnormal    Collection Time: 05/24/22  6:42 AM  Result Value Ref Range    Glucose-Capillary 179 (H) 70 - 99 mg/dL      Comment: Glucose reference range applies only to samples taken after fasting for at least 8 hours.    Comment 1 Notify RN    Glucose, capillary     Status: Abnormal    Collection Time: 05/24/22 12:49 PM  Result Value Ref Range    Glucose-Capillary 282 (H) 70 - 99 mg/dL      Comment: Glucose reference range applies only to samples taken after fasting for at least 8 hours.    Comment 1 Notify RN      Comment 2 Document in Chart    Glucose, capillary     Status: Abnormal    Collection Time: 05/24/22  4:11 PM  Result Value Ref Range    Glucose-Capillary 166 (H) 70 - 99  mg/dL      Comment: Glucose reference range applies only to samples taken after fasting for at least 8 hours.    Comment 1 Notify RN      Comment 2 Document in Chart    Glucose, capillary     Status: Abnormal    Collection Time: 05/24/22  9:31 PM  Result Value Ref Range  Glucose-Capillary 148 (H) 70 - 99 mg/dL      Comment: Glucose reference range applies only to samples taken after fasting for at least 8 hours.  Glucose, capillary     Status: Abnormal    Collection Time: 05/25/22  6:10 AM  Result Value Ref Range    Glucose-Capillary 145 (H) 70 - 99 mg/dL      Comment: Glucose reference range applies only to samples taken after fasting for at least 8 hours.  CBC     Status: Abnormal    Collection Time: 05/25/22  6:51 AM  Result Value Ref Range    WBC 6.8 4.0 - 10.5 K/uL    RBC 3.98 (L) 4.22 - 5.81 MIL/uL    Hemoglobin 11.8 (L) 13.0 - 17.0 g/dL    HCT 36.1 (L) 39.0 - 52.0 %    MCV 90.7 80.0 - 100.0 fL    MCH 29.6 26.0 - 34.0 pg    MCHC 32.7 30.0 - 36.0 g/dL    RDW 12.5 11.5 - 15.5 %    Platelets 113 (L) 150 - 400 K/uL    nRBC 0.0 0.0 - 0.2 %      Comment: Performed at [facility], [place] [address], [place],  [address]  Basic metabolic panel     Status: Abnormal    Collection Time: 05/25/22  6:51 AM  Result Value Ref Range    Sodium 136 135 - 145 mmol/L    Potassium 3.5 3.5 - 5.1 mmol/L    Chloride 103 98 - 111 mmol/L    CO2 23 22 - 32 mmol/L    Glucose, Bld 164 (H) 70 - 99 mg/dL      Comment: Glucose reference range applies only to samples taken after fasting for at least 8 hours.    BUN 9 8 - 23 mg/dL    Creatinine, Ser 1.29 (H) 0.61 - 1.24 mg/dL    Calcium 8.7 (L) 8.9 - 10.3 mg/dL    GFR, Estimated 57 (L) >60 mL/min      Comment: (NOTE) Calculated using the CKD-EPI Creatinine Equation (2021)      Anion gap 10 5 - 15      Comment: Performed at [facility] [address] [place], [place] [address]  Glucose, capillary     Status: Abnormal     Collection Time: 05/25/22 12:12 PM  Result Value Ref Range    Glucose-Capillary 187 (H) 70 - 99 mg/dL      Comment: Glucose reference range applies only to samples taken after fasting for at least 8 hours.  Glucose, capillary     Status: Abnormal    Collection Time: 05/25/22  4:19 PM  Result Value Ref Range    Glucose-Capillary 180 (H) 70 - 99 mg/dL      Comment: Glucose reference range applies only to samples taken after fasting for at least 8 hours.  Glucose, capillary     Status: Abnormal    Collection Time: 05/25/22  9:29 PM  Result Value Ref Range    Glucose-Capillary 208 (H) 70 - 99 mg/dL      Comment: Glucose reference range applies only to samples taken after fasting for at least 8 hours.      Imaging Results (Last 48 hours)  No results found.         Blood pressure 127/76, pulse 80, temperature 98.6 F (37 C), temperature source Oral, resp. rate 16, height _0  (1.88 m), weight 99.8 kg,  SpO2 93 %.   Medical Problem List and Plan: 1. Functional deficits secondary to subacute right MCA ischemic infarction             -patient may  shower             -ELOS/Goals:  10 to 14 days , mod I to sup with PT/OT, sup with SLP             -Admit to CIR 2.  Antithrombotics: -DVT/anticoagulation:  Pharmaceutical: Eliquis             -antiplatelet therapy: Aspirin 81 mg daily 3. Pain Management: Neurontin 300 mg 3 times daily, Elavil 17m nightly, Flexeril 10 mg nightly as needed             -Consider increase dose neuronin  for neuropathic pain 4. Mood/Behavior/Sleep: Lexapro 10 mg nightly, Elavil 75 mg nightly.  Provide emotional support             -antipsychotic agents: N/A 5. Neuropsych/cognition: This patient is capable of making decisions on his own behalf. 6. Skin/Wound Care: Routine skin checks 7. Fluids/Electrolytes/Nutrition: Routine in and outs with follow-up chemistries 8.  Atrial fibrillation/AICD.  Continue amiodarone 200 mg daily as well as Eliquis.  Cardiac rate  controlled 9.  Diabetes mellitus.  Hemoglobin A1c 9.9.  Semglee 32 units nightly.  SSI. Check blood sugars before meals and at bedtime 10.  Hyperlipidemia.  Lipitor 448mdaily 11.  Chronic systolic congestive heart failure.  Monitor for any signs of fluid overload. On Furosemide 2014maily.  12.  BPH.  Hytrin 5 mg twice daily 13.  Tobacco use.  Provide counseling 14.  Hypothyroidism.  Synthroid 15.  CKD.  Baseline creatinine 1.43-1.94.  Follow-up chemistries 16. HTN. Long term goal normotensive. Home medications metoprolol and cozaar currently on hold. Monitor with activity. 17. Hypothyroidism. Continue synthroid  DanCathlyn ParsonsA-C 05/26/2022  I have personally performed a face to face diagnostic evaluation of this patient and formulated the key components of the plan.  Additionally, I have personally reviewed laboratory data, imaging studies, as well as relevant notes and concur with the physician assistant's documentation above.  The patient's status has not changed from the original H&P.  Any changes in documentation from the acute care chart have been noted above.  YurJennye BoroughsD, FAAMellody Drown

## 2022-05-27 NOTE — Discharge Instructions (Addendum)
Inpatient Rehab Discharge Instructions  Marquee Junior Ciszewski Discharge date and time: No discharge date for patient encounter.   Activities/Precautions/ Functional Status: Activity: activity as tolerated Diet: diabetic diet Wound Care: Routine skin checks Functional status:  ___ No restrictions     ___ Walk up steps independently ___ 24/7 supervision/assistance   ___ Walk up steps with assistance ___ Intermittent supervision/assistance  ___ Bathe/dress independently ___ Walk with walker     _x__ Bathe/dress with assistance ___ Walk Independently    ___ Shower independently ___ Walk with assistance    ___ Shower with assistance ___ No alcohol     ___ Return to work/school ________  COMMUNITY REFERRALS UPON DISCHARGE:    Home Health:   PT     OT     ST                       Agency: [facility] Phone: [phone]    Medical Equipment/Items Ordered: Wheelchair, Conservation officer, nature, Civil engineer, contracting, Advertising account planner: [place]   Special Instructions: No driving smoking or alcoholSTROKE/TIA DISCHARGE INSTRUCTIONS SMOKING Cigarette smoking nearly doubles your risk of having a stroke & is the single most alterable risk factor  If you smoke or have smoked in the last 12 months, you are advised to quit smoking for your health. Most of the excess cardiovascular risk related to smoking disappears within a year of stopping. Ask you doctor about anti-smoking medications [place] Quit Line: 1-800-QUIT NOW Free Smoking Cessation Classes [phone]  CHOLESTEROL Know your levels; limit fat & cholesterol in your diet  Lipid Panel     Component Value Date/Time   CHOL 158 05/22/2022 0241   TRIG 247 (H) 05/22/2022 0241   HDL 27 (L) 05/22/2022 0241   CHOLHDL 5.9 05/22/2022 0241   VLDL 49 (H) 05/22/2022 0241   LDLCALC 82 05/22/2022 0241     Many patients benefit from treatment even if their cholesterol is at goal. Goal: Total Cholesterol (CHOL) less  than 160 Goal:  Triglycerides (TRIG) less than 150 Goal:  HDL greater than 40 Goal:  LDL (LDLCALC) less than 100   BLOOD PRESSURE American Stroke Association blood pressure target is less that 120/80 mm/Hg  Your discharge blood pressure is:  BP: 124/76 Monitor your blood pressure Limit your salt and alcohol intake Many individuals will require more than one medication for high blood pressure  DIABETES (A1c is a blood sugar average for last 3 months) Goal HGBA1c is under 7% (HBGA1c is blood sugar average for last 3 months)  Diabetes:   Lab Results  Component Value Date   HGBA1C 9.9 (H) 05/21/2022    Your HGBA1c can be lowered with medications, healthy diet, and exercise. Check your blood sugar as directed by your physician Call your physician if you experience unexplained or low blood sugars.  PHYSICAL ACTIVITY/REHABILITATION Goal is 30 minutes at least 4 days per week  Activity: Increase activity slowly, Therapies: Physical Therapy: Home Health Return to work:  Activity decreases your risk of heart attack and stroke and makes your heart stronger.  It helps control your weight and blood pressure; helps you relax and can improve your mood. Participate in a regular exercise program. Talk with  your doctor about the best form of exercise for you (dancing, walking, swimming, cycling).  DIET/WEIGHT Goal is to maintain a healthy weight  Your discharge diet is:  Diet Order             Diet Carb Modified Fluid consistency: Thin; Room service appropriate? Yes with Assist  Diet effective now                   liquids Your height is:  Height: 6\' 2"  (188 cm) Your current weight is:   Your Body Mass Index (BMI) is:    Following the type of diet specifically designed for you will help prevent another stroke. Your goal weight range is:   Your goal Body Mass Index (BMI) is 19-24. Healthy food habits can help reduce 3 risk factors for stroke:  High cholesterol, hypertension, and excess  weight.  RESOURCES Stroke/Support Group:  Call [phone]   STROKE EDUCATION PROVIDED/REVIEWED AND GIVEN TO PATIENT Stroke warning signs and symptoms How to activate emergency medical system (call 911). Medications prescribed at discharge. Need for follow-up after discharge. Personal risk factors for stroke. Pneumonia vaccine given: No Flu vaccine given: No My questions have been answered, the writing is legible, and I understand these instructions.  I will adhere to these goals & educational materials that have been provided to me after my discharge from the hospital.      My questions have been answered and I understand these instructions. I will adhere to these goals and the provided educational materials after my discharge from the hospital.  Patient/Caregiver Signature _______________________________ Date __________  Clinician Signature _______________________________________ Date __________  Please bring this form and your medication list with you to all your follow-up doctor's appointments.

## 2022-05-27 NOTE — Progress Notes (Signed)
Patient ID: Grant [name], male   DOB: Aug 13, 1945, 76 y.o.   MRN: [id number]  Per Corbin Ade at [facility] patient is 100% service connected which means he's eligible for SNF care through a [facility] contracted facility if needed. He's also eligible for skilled HH, H/HHA and DME

## 2022-05-27 NOTE — Plan of Care (Signed)
  Problem: RH Balance Goal: LTG: Patient will maintain dynamic sitting balance (OT) Description: LTG:  Patient will maintain dynamic sitting balance with assistance during activities of daily living (OT) Flowsheets (Taken 05/27/2022 1218) LTG: Pt will maintain dynamic sitting balance during ADLs with: Independent Goal: LTG Patient will maintain dynamic standing with ADLs (OT) Description: LTG:  Patient will maintain dynamic standing balance with assist during activities of daily living (OT)  Flowsheets (Taken 05/27/2022 1218) LTG: Pt will maintain dynamic standing balance during ADLs with: Contact Guard/Touching assist   Problem: Sit to Stand Goal: LTG:  Patient will perform sit to stand in prep for activites of daily living with assistance level (OT) Description: LTG:  Patient will perform sit to stand in prep for activites of daily living with assistance level (OT) Flowsheets (Taken 05/27/2022 1218) LTG: PT will perform sit to stand in prep for activites of daily living with assistance level: Supervision/Verbal cueing   Problem: RH Bathing Goal: LTG Patient will bathe all body parts with assist levels (OT) Description: LTG: Patient will bathe all body parts with assist levels (OT) Flowsheets (Taken 05/27/2022 1218) LTG: Pt will perform bathing with assistance level/cueing: Contact Guard/Touching assist   Problem: RH Dressing Goal: LTG Patient will perform upper body dressing (OT) Description: LTG Patient will perform upper body dressing with assist, with/without cues (OT). Flowsheets (Taken 05/27/2022 1218) LTG: Pt will perform upper body dressing with assistance level of: Set up assist Goal: LTG Patient will perform lower body dressing w/assist (OT) Description: LTG: Patient will perform lower body dressing with assist, with/without cues in positioning using equipment (OT) Flowsheets (Taken 05/27/2022 1218) LTG: Pt will perform lower body dressing with assistance level of: Contact  Guard/Touching assist   Problem: RH Toileting Goal: LTG Patient will perform toileting task (3/3 steps) with assistance level (OT) Description: LTG: Patient will perform toileting task (3/3 steps) with assistance level (OT)  Flowsheets (Taken 05/27/2022 1218) LTG: Pt will perform toileting task (3/3 steps) with assistance level: Contact Guard/Touching assist   Problem: RH Functional Use of Upper Extremity Goal: LTG Patient will use RT/LT upper extremity as a (OT) Description: LTG: Patient will use right/left upper extremity as a stabilizer/gross assist/diminished/nondominant/dominant level with assist, with/without cues during functional activity (OT) Flowsheets (Taken 05/27/2022 1218) LTG: Use of upper extremity in functional activities: LUE as gross assist level LTG: Pt will use upper extremity in functional activity with assistance level of: Independent   Problem: RH Toilet Transfers Goal: LTG Patient will perform toilet transfers w/assist (OT) Description: LTG: Patient will perform toilet transfers with assist, with/without cues using equipment (OT) Flowsheets (Taken 05/27/2022 1218) LTG: Pt will perform toilet transfers with assistance level of: Supervision/Verbal cueing   Problem: RH Tub/Shower Transfers Goal: LTG Patient will perform tub/shower transfers w/assist (OT) Description: LTG: Patient will perform tub/shower transfers with assist, with/without cues using equipment (OT) Flowsheets (Taken 05/27/2022 1218) LTG: Pt will perform tub/shower stall transfers with assistance level of: Contact Guard/Touching assist LTG: Pt will perform tub/shower transfers from: Walk in shower   Problem: RH Awareness Goal: LTG: Patient will demonstrate awareness during functional activites type of (OT) Description: LTG: Patient will demonstrate awareness during functional activites type of (OT) Flowsheets (Taken 05/27/2022 1218) Patient will demonstrate awareness during functional activites type  of:  Intellectual  Emergent LTG: Patient will demonstrate awareness during functional activites type of (OT): (awareness of L side of body) Supervision

## 2022-05-27 NOTE — Evaluation (Signed)
Speech Language Pathology Assessment and Plan  Patient Details  Name: Grant [name] MRN: [id number] Date of Birth: 07/27/1945  SLP Diagnosis: Cognitive Impairments  Rehab Potential: Good ELOS: 12-14 days   Today's Date: 05/27/2022 SLP Individual Time: 1300-1400 SLP Individual Time Calculation (min): 60 min  Hospital Problem: Principal Problem:   Right middle cerebral artery stroke [facility])  Past Medical History:  Past Medical History:  Diagnosis Date   AICD (automatic cardioverter/defibrillator) present    BPH (benign prostatic hyperplasia)    CAD (coronary artery disease)    DM (diabetes mellitus) ([place])    HLD (hyperlipidemia)    Hyperglycemia 03/09/2017   Hypothyroidism    NSVT (nonsustained ventricular tachycardia) ([facility])    Obstructive sleep apnea    Past Surgical History:  Past Surgical History:  Procedure Laterality Date   ABDOMINAL SURGERY  1981   repair lungs, liver, and intenstines from trauma    Assessment / Plan / Recommendation Clinical Impression Grant [name] is a 76 year old right-handed male with history significant for CKD with creatinine baseline 5.94-5.85 chronic systolic congestive heart failure with AICD, A-fib on Eliquis as well as amiodarone, BPH, CAD maintained on low-dose aspirin, diabetes mellitus, tobacco use,  and hyperlipidemia.  Per chart review patient lives with spouse.  1 level home 2 steps to entry.  Independent with a cane prior to admission.  By report patient and his wife and went to [place] for Thanksgiving.  His left shoulder was hurting with throbbing pains.  He noticed numbness along the entire left side of his body.  Each day became a little worse he did not want to go to the hospital initially.  There was reports of a couple of falls.  Patient returned to the area presented 05/20/2022 with increasing left-sided weakness.  Cranial CT scan showed no acute intracranial abnormality.  Multiple old small vessel infarcts of the deep gray  nuclei.  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  No intracranial large vessel occlusion.  MRI not completed due to AICD.  Neurology follow-up films reviewed suspect subacute right thalamic cardioembolic infarct.  He reports his strength is gradually improving. He says he has electric stock type pain in his LUE. Tolerating a regular consistency diet.  Therapy evaluations completed due to patient's left-sided weakness decreased functional mobility was admitted for a comprehensive rehab program.   Pt presents with cognitive deficits in the areas of attention, short-term recall, problem solving, and awareness. Participation limited by fatigue toward end of session. Prior cognitive status unknown. The [facility] Mental Status Examination was completed to evaluate the pt's cognitive-linguistic skills. Pt achieved a score of 12/30 which is below the normal limits of 25 or more out of 30 based on pt's education history of 11th grade education. Expressive/receptive language appeared grossly intact, however pt did benefit from verbal repetition and extended time for comprehension and processing. Verbal expression was limited at times due to occasional word searching behavior however this appeared attributed to attention deficits rather than true language impairment. Speech intelligibility was mildly reduced at times. This may also have attributed to fatigue and baseline dialect considerations. Pt feels speech is at baseline. SLP recommend skilled ST intervention to address cognitive-linguistic skills to maximize functional independence prior to discharge.     Skilled Therapeutic Interventions          SLP facilitated assessments of cognitive-linguistic, speech, and language function. Please see report for full details.  SLP Assessment  Patient will need skilled [facility] during CIR admission     Recommendations  Patient destination: Home Follow up Recommendations: 24 hour supervision/assistance;Home Health SLP;Outpatient SLP Equipment Recommended: None recommended by SLP    SLP Frequency 3 to 5 out of 7 days   SLP Duration  SLP Intensity  SLP Treatment/Interventions 12-14 days  Minumum of 1-2 x/day, 30 to 90 minutes  Cognitive remediation/compensation;Functional tasks;Internal/external aids;Patient/family education;Therapeutic Activities    Prior Functioning Cognitive/Linguistic Baseline: Information not available Type of Home: House  Lives With: Spouse Available Help at Discharge: Family;Available 24 hours/day Education: 11th grade Vocation: Retired (worked for an [employer])  SLP Evaluation Cognition Overall Cognitive Status: Impaired/Different from baseline Arousal/Alertness: Awake/alert (became drowsy throughout session) Orientation Level: Oriented X4 Year: 2023 Month: December Day of Week: Correct Attention: Sustained;Focused Focused Attention: Appears intact Sustained Attention: Impaired Sustained Attention Impairment: Verbal complex Selective Attention: Impaired Selective Attention Impairment: Verbal complex Memory: Impaired Memory Impairment: Decreased recall of new information;Retrieval deficit Awareness: Impaired Awareness Impairment: Emergent impairment Problem Solving: Impaired Problem Solving Impairment: Verbal complex;Verbal basic Safety/Judgment: Impaired  Comprehension Auditory Comprehension Overall Auditory Comprehension: Impaired Interfering Components: Working Marine scientist;Attention;Processing speed EffectiveTechniques: Extra processing time;Repetition Expression Expression Primary Mode of Expression: Verbal Verbal Expression Overall Verbal Expression: Impaired Initiation: No impairment Naming: Impairment Divergent:  (named 6 animals within 1 minute) Interfering Components: Attention Written Expression Written Expression: Exceptions  to [employer] Oral Motor Oral Motor/Sensory Function Overall Oral Motor/Sensory Function: Within functional limits Motor Speech Overall Motor Speech: Appears within functional limits for tasks assessed  Care Tool Care Tool Cognition Ability to hear (with hearing aid or hearing appliances if normally used Ability to hear (with hearing aid or hearing appliances if normally used): 1. Minimal difficulty - difficulty in some environments (e.g. when person speaks softly or setting is noisy)   Expression of Ideas and Wants Expression of Ideas and Wants: 3. Some difficulty - exhibits some difficulty with expressing needs and ideas (e.g, some words or finishing thoughts) or speech is not clear   Understanding Verbal and Non-Verbal Content Understanding Verbal and Non-Verbal Content: 3. Usually understands - understands most conversations, but misses some part/intent of message. Requires cues at times to understand  Memory/Recall Ability Memory/Recall Ability : Current season;That he or she is in a hospital/hospital unit   Short Term Goals: Week 1: SLP Short Term Goal 1 (Week 1): Patient will recall and demonstrate use of memory compensations to increase independence and safety at home with modA verbal cues. SLP Short Term Goal 2 (Week 1): Patient will demonstrate functional problem solving for basic and familiar tasks with Mod A verbal cues. SLP Short Term Goal 3 (Week 1): Pt will demonstrate awareness of errors during functional tasks with mod A verbal cues SLP Short Term Goal 4 (Week 1): Pt will demonstrate selective attention by completing cognitive tasks within a mildly distracting environment with min-to-mod A verbal redirection cues.  Refer to Care Plan for Long Term Goals  Recommendations for other services: None   Discharge Criteria: Patient will be discharged from SLP if patient refuses treatment 3 consecutive times without medical reason, if treatment goals not met, if there is a change in  medical status, if patient makes no progress towards goals or if patient is discharged from hospital.  The above assessment, treatment plan, treatment alternatives and goals were discussed and mutually agreed upon: by patient  Patty Sermons 05/27/2022, 5:19 PM

## 2022-05-27 NOTE — Evaluation (Signed)
Occupational Therapy Assessment and Plan  Patient Details  Name: Grant Merriweather MRN: [id number] Date of Birth: December 31, 1945  OT Diagnosis: abnormal posture, apraxia, ataxia, cognitive deficits, and hemiplegia affecting non-dominant side Rehab Potential: Rehab Potential (ACUTE ONLY): Excellent ELOS: 14-16 days   Today's Date: 05/27/2022 OT Individual Time: 0930-1050 OT Individual Time Calculation (min): 80 min     Hospital Problem: Principal Problem:   Right middle cerebral artery stroke [facility])   Past Medical History:  Past Medical History:  Diagnosis Date   AICD (automatic cardioverter/defibrillator) present    BPH (benign prostatic hyperplasia)    CAD (coronary artery disease)    DM (diabetes mellitus) ([place])    HLD (hyperlipidemia)    Hyperglycemia 03/09/2017   Hypothyroidism    NSVT (nonsustained ventricular tachycardia) ([facility])    Obstructive sleep apnea    Past Surgical History:  Past Surgical History:  Procedure Laterality Date   ABDOMINAL SURGERY  1981   repair lungs, liver, and intenstines from trauma    Assessment & Plan Clinical Impression: 76 year old male with history of DM, CAD, HLD , chronic systolic CHF, mood disorder, afib on Eliquis, hypothyroidism and OSA who was out of town in [place] when he began having left sided numbness and weakness on left side. He did not seek medical care until returned to [facility] . Presented on 05/20/22.   MRI unobtainable given AICD not MRI compatible. CTA per Neurology with no acute intracranial abnormalities. Severe stenosis of the right vertebral artery.  Felt to be subacute Right MCA ischemic infarct likely due to Afib on Eliquis. Neurology recommend continue Eliquis, ASA and Statin. 2 d echo extremely limited with EF 60 to 65%. Home meds of Losartan for HTN. LDL 82 with home Lipitor resumed. DM type 2 with Hgb A1c 9.9. Home meds of insulin and now SSI. Continue on amiodarone for rate control for A fib. Continue amitriptyline,  Lexapro and gabapentin for mood disorder. Synthroid for hypothyroidism.   Patient transferred to CIR on 05/26/2022 .    Patient currently requires max with basic self-care skills secondary to impaired timing and sequencing and decreased coordination, decreased attention to left and decreased motor planning, decreased problem solving and decreased memory, and decreased sitting balance, decreased standing balance, decreased postural control, hemiplegia, and decreased balance strategies.  Prior to hospitalization, patient was fully independent.   Patient will benefit from skilled intervention to increase independence with basic self-care skills prior to discharge home with care partner.  Anticipate patient will require intermittent supervision to CGA and follow up home health.  OT - End of Session Activity Tolerance: Tolerates 30+ min activity with multiple rests Endurance Deficit: Yes Endurance Deficit Description: Decreased OT Assessment Rehab Potential (ACUTE ONLY): Excellent OT Patient demonstrates impairments in the following area(s): Balance;Cognition;Endurance;Motor;Perception;Sensory;Safety OT Basic ADL's Functional Problem(s): Bathing;Dressing;Toileting OT Transfers Functional Problem(s): Toilet;Tub/Shower OT Additional Impairment(s): Fuctional Use of Upper Extremity OT Plan OT Intensity: Minimum of 1-2 x/day, 45 to 90 minutes OT Frequency: 5 out of 7 days OT Duration/Estimated Length of Stay: 14-16 days OT Treatment/Interventions: Balance/vestibular training;Cognitive remediation/compensation;DME/adaptive equipment instruction;Functional mobility training;Neuromuscular re-education;Patient/family education;Psychosocial support;Self Care/advanced ADL retraining;Therapeutic Activities;Therapeutic Exercise;UE/LE Coordination activities;Visual/perceptual remediation/compensation OT Self Feeding Anticipated Outcome(s): independent OT Basic Self-Care Anticipated Outcome(s): set up UB self care,  CGA LB self care OT Toileting Anticipated Outcome(s): CGA OT Bathroom Transfers Anticipated Outcome(s): supervision with RW to toilet, CGA to shower OT Recommendation Patient destination: Home Follow Up Recommendations: Home health OT Equipment Recommended: Tub/shower seat   OT Evaluation Precautions/Restrictions  Precautions Precautions: Fall Precaution Comments: LT wrist pain with WB (fall 2 months ago on hand. No imaging) Restrictions Weight Bearing Restrictions: No    Vital Signs Therapy Vitals Pulse Rate: 80 BP: 123/89 Pain Pain Assessment Pain Scale: 0-10 Pain Score: 0-No pain Multiple Pain Sites: No Home Living/Prior Functioning Home Living Family/patient expects to be discharged to:: Private residence Living Arrangements: Spouse/significant other Available Help at Discharge: Family, Available 24 hours/day Type of Home: House Home Access: Stairs to enter Technical brewer of Steps: 2 Entrance Stairs-Rails: Can reach both Home Layout: One level Bathroom Shower/Tub: Multimedia programmer: Standard Bathroom Accessibility: Yes Additional Comments: grab bars, no chair in shower  Lives With: Spouse Prior Function Level of Independence: Independent with basic ADLs, Requires assistive device for independence, Independent with transfers, Independent with gait (Mentions using SPC for ambulating)  Able to Take Stairs?: Yes Driving: Yes Vocation: Retired Leisure:  (Enjoys Research scientist (physical sciences)) Vision Baseline Vision/History: 1 Wears glasses Ability to See in Adequate Light: 1 Impaired Patient Visual Report:  (in L eye) Vision Assessment?: Yes Eye Alignment: Within Functional Limits Alignment/Gaze Preference: Within Defined Limits Tracking/Visual Pursuits: Able to track stimulus in all quads without difficulty Convergence: Within functional limits Visual Fields: No apparent deficits Additional Comments: Pt does not neglect left side of  enviornment but does neglect left side of his body Perception  Perception: Impaired Inattention/Neglect: Does not attend to left side of body Praxis Praxis: Impaired Praxis Impairment Details: Motor planning (Pt shows decrease motor planning with left UE) Cognition Cognition Overall Cognitive Status: Within Functional Limits for tasks assessed Arousal/Alertness: Awake/alert Orientation Level: Person;Place;Situation Person: Oriented Place: Oriented Situation: Oriented Memory: Appears intact Attention: Sustained Sustained Attention: Appears intact Selective Attention: Impaired Selective Attention Impairment: Verbal basic Awareness: Appears intact Problem Solving: Impaired Problem Solving Impairment: Functional basic Safety/Judgment: Impaired Comments: Pt neglects left side of his body, unware of where his left hand  in space Brief Interview for Mental Status (BIMS) Repetition of Three Words (First Attempt): 1 Temporal Orientation: Year: Correct Temporal Orientation: Month: Accurate within 5 days Temporal Orientation: Day: Correct Recall: "Sock": No, could not recall Recall: "Blue": Yes, no cue required Recall: "Bed": No, could not recall BIMS Summary Score: 9 Sensation Sensation Light Touch: Impaired by gross assessment Hot/Cold: Not tested Proprioception: Impaired by gross assessment Stereognosis: Impaired by gross assessment Additional Comments: Decrease sensation on L side of body, unable accuratley sense light touch or sustained pressure on L side of boyd Coordination Gross Motor Movements are Fluid and Coordinated: Yes Fine Motor Movements are Fluid and Coordinated: Yes Finger Nose Finger Test: Mild Dysmetria on left Heel Shin Test: Decreased ROM left with Mild Dysmetria Motor  Motor Motor: Ataxia;Abnormal postural alignment and control;Motor apraxia;Hemiplegia  Trunk/Postural Assessment  Cervical Assessment Cervical Assessment: Within Functional Limits Thoracic  Assessment Thoracic Assessment: Exceptions to [facility] (Rounded shoulders) Lumbar Assessment Lumbar Assessment: Within Functional Limits Postural Control Postural Control: Deficits on evaluation  Balance Balance Balance Assessed: Yes Static Sitting Balance Static Sitting - Balance Support: Bilateral upper extremity supported;No upper extremity supported Static Sitting - Level of Assistance: 5: Stand by assistance Dynamic Sitting Balance Dynamic Sitting - Balance Support: Bilateral upper extremity supported Dynamic Sitting - Level of Assistance: 4: Min assist (Left side neglect. Required assitance with left arm during lateral scoot/ weight shifts) Dynamic Sitting - Balance Activities: Lateral lean/weight shifting;Forward lean/weight shifting Sitting balance - Comments: Posterior LOB when raising UE Static Standing Balance Static Standing - Balance Support: Bilateral upper extremity supported (Required  assistance with maintaining L hand on RW) Static Standing - Level of Assistance: 4: Min assist Dynamic Standing Balance Dynamic Standing - Level of Assistance: 2: Max assist Dynamic Standing - Comments: left lean Extremity/Trunk Assessment RUE Assessment Active Range of Motion (AROM) Comments: WFL but very slow to achieve full ROM. movements are labored. General Strength Comments: 4-/5 LUE Assessment Passive Range of Motion (PROM) Comments: sh flexion to 160 Active Range of Motion (AROM) Comments: sh flexion 70. elbow WFL General Strength Comments: 3+/ 5 throughout - incoordinated movement, limited sensation  Care Tool Care Tool Self Care Eating   Eating Assist Level: Set up assist    Oral Care    Oral Care Assist Level: Set up assist    Bathing   Body parts bathed by patient: Chest;Abdomen;Right upper leg;Left upper leg;Buttocks;Face;Left arm Body parts bathed by helper: Front perineal area;Left lower leg;Right lower leg;Right arm   Assist Level: Moderate Assistance - Patient 50 -  74%    Upper Body Dressing(including orthotics)   What is the patient wearing?: Pull over shirt   Assist Level: Moderate Assistance - Patient 50 - 74%    Lower Body Dressing (excluding footwear)   What is the patient wearing?: Underwear/pull up;Pants Assist for lower body dressing: Maximal Assistance - Patient 25 - 49%    Putting on/Taking off footwear   What is the patient wearing?: Non-skid slipper socks Assist for footwear: Maximal Assistance - Patient 25 - 49%       Care Tool Toileting Toileting activity   Assist for toileting: Maximal Assistance - Patient 25 - 49%     Care Tool Bed Mobility Roll left and right activity   Roll left and right assist level: Contact Guard/Touching assist    Sit to lying activity   Sit to lying assist level: Minimal Assistance - Patient > 75%    Lying to sitting on side of bed activity   Lying to sitting on side of bed assist level: the ability to move from lying on the back to sitting on the side of the bed with no back support.: Minimal Assistance - Patient > 75%     Care Tool Transfers Sit to stand transfer   Sit to stand assist level: Minimal Assistance - Patient > 75% (Required consistent verbal and tactile cuing)    Chair/bed transfer   Chair/bed transfer assist level: Minimal Assistance - Patient > 75% (Required consistent verbal and tactile cuing)     Toilet transfer   Assist Level: Moderate Assistance - Patient 50 - 74%     Care Tool Cognition  Expression of Ideas and Wants Expression of Ideas and Wants: 4. Without difficulty (complex and basic) - expresses complex messages without difficulty and with speech that is clear and easy to understand  Understanding Verbal and Non-Verbal Content Understanding Verbal and Non-Verbal Content: 3. Usually understands - understands most conversations, but misses some part/intent of message. Requires cues at times to understand   Memory/Recall Ability Memory/Recall Ability : Current  season;Location of own room;That he or she is in a hospital/hospital unit;Staff names and faces   Refer to Care Plan for Long Term Goals  SHORT TERM GOAL WEEK 1 OT Short Term Goal 1 (Week 1): Pt will demonstrate improved LUE coordination to don shirt with min A. OT Short Term Goal 2 (Week 1): Pt will demonstrate improved dynamic standing balance to pull pants over hips with mod A or less to support balance OT Short Term Goal 3 (Week 1): Pt  will complete RW transfers to toilet with CGA or less. OT Short Term Goal 4 (Week 1): Pt will demonstrate improved L side awareness during bathing to wash UB with min cues to attend to L arm.  Recommendations for other services: None    Skilled Therapeutic Intervention ADL ADL Eating: Set up Grooming: Setup Upper Body Bathing: Moderate cueing;Minimal assistance Where Assessed-Upper Body Bathing: Sitting at sink Lower Body Bathing: Moderate assistance;Moderate cueing Upper Body Dressing: Moderate cueing;Moderate assistance Lower Body Dressing: Maximal assistance Toileting: Maximal assistance Where Assessed-Toileting: Glass blower/designer: Moderate assistance Toilet Transfer Method: Stand pivot Mobility  Bed Mobility Bed Mobility: Rolling Left;Supine to Sit;Sitting - Scoot to [employer] of Bed Rolling Left: Minimal Assistance - Patient > 75% Supine to Sit: Minimal Assistance - Patient > 75% (Pt reprots increase lightheadedness/dizziness during transition) Sitting - Scoot to Edge of Bed: Minimal Assistance - Patient > 75% Transfers Sit to Stand: Minimal Assistance - Patient > 75% (Pt reprots increase lightheadedness/dizziness during transition) Stand to Sit: Minimal Assistance - Patient > 75%   Pt seen for initial evaluation and ADL training with a focus L side awareness, coordination and balance.  Explained role of OT, discussed pt's goals and his current challenges.  Pt participated well in evaluation and followed cues well. His greatest deficits  are dynamic balance in standing during LB self care.  Pt does have a L lean due to decreased awareness/proprioception of L side. Pt completed bathing and dressing at sink.  Pt worked on tranfers bed to [employer] and then to [employer].  Pt resting in recliner with all needs met.  Nurse tech alerted pt needs a belt alarm on.    Discharge Criteria: Patient will be discharged from OT if patient refuses treatment 3 consecutive times without medical reason, if treatment goals not met, if there is a change in medical status, if patient makes no progress towards goals or if patient is discharged from hospital.  The above assessment, treatment plan, treatment alternatives and goals were discussed and mutually agreed upon: by patient  [facility] 05/27/2022, 12:08 PM

## 2022-05-27 NOTE — Plan of Care (Cosign Needed)
Problem: RH Balance Goal: LTG Patient will maintain dynamic sitting balance (PT) Description: LTG:  Patient will maintain dynamic sitting balance with assistance during mobility activities (PT) Flowsheets (Taken 05/27/2022 1153) LTG: Pt will maintain dynamic sitting balance during mobility activities with:: Supervision/Verbal cueing Goal: LTG Patient will maintain dynamic standing balance (PT) Description: LTG:  Patient will maintain dynamic standing balance with assistance during mobility activities (PT) Flowsheets (Taken 05/27/2022 1153) LTG: Pt will maintain dynamic standing balance during mobility activities with:: Contact Guard/Touching assist   Problem: Sit to Stand Goal: LTG:  Patient will perform sit to stand with assistance level (PT) Description: LTG:  Patient will perform sit to stand with assistance level (PT) Flowsheets (Taken 05/27/2022 1153) LTG: PT will perform sit to stand in preparation for functional mobility with assistance level: Supervision/Verbal cueing   Problem: RH Bed Mobility Goal: LTG Patient will perform bed mobility with assist (PT) Description: LTG: Patient will perform bed mobility with assistance, with/without cues (PT). 05/27/2022 1344 by Hilary Hertz, Student-PT Flowsheets (Taken 05/27/2022 1344) LTG: Pt will perform bed mobility with assistance level of: Independent 05/27/2022 1153 by Hilary Hertz, Student-PT Flowsheets (Taken 05/27/2022 1153) LTG: Pt will perform bed mobility with assistance level of: Supervision/Verbal cueing   Problem: RH Bed to Chair Transfers Goal: LTG Patient will perform bed/chair transfers w/assist (PT) Description: LTG: Patient will perform bed to chair transfers with assistance (PT). Flowsheets (Taken 05/27/2022 1153) LTG: Pt will perform Bed to Chair Transfers with assistance level: Contact Guard/Touching assist   Problem: RH Floor Transfers Goal: LTG Patient will perform floor transfers w/assist (PT) Description: LTG:  Patient will perform floor transfers with assistance (PT). Flowsheets (Taken 05/27/2022 1153) LTG: PT WILL PERFORM FLOOR TRANFERS  WITH  ASSIST:: Minimal Assistance - Patient > 75%   Problem: RH Ambulation Goal: LTG Patient will ambulate in controlled environment (PT) Description: LTG: Patient will ambulate in a controlled environment, # of feet with assistance (PT). Flowsheets (Taken 05/27/2022 1153) LTG: Pt will ambulate in controlled environ  assist needed:: Supervision/Verbal cueing LTG: Ambulation distance in controlled environment: ambulate 75 ft w LRAD Goal: LTG Patient will ambulate in home environment (PT) Description: LTG: Patient will ambulate in home environment, # of feet with assistance (PT). Flowsheets (Taken 05/27/2022 1153) LTG: Pt will ambulate in home environ  assist needed:: Supervision/Verbal cueing LTG: Ambulation distance in home environment: ambulate 50 ft w LRAD Goal: LTG Patient will ambulate in community environment (PT) Description: LTG: Patient will ambulate in community environment, # of feet with assistance (PT). Flowsheets (Taken 05/27/2022 1153) LTG: Pt will ambulate in community environ  assist needed:: Supervision/Verbal cueing LTG: Ambulation distance in community environment: Ambulate 50 ft using LRAD   Problem: RH Wheelchair Mobility Goal: LTG Patient will propel w/c in controlled environment (PT) Description: LTG: Patient will propel wheelchair in controlled environment, # of feet with assist (PT) Flowsheets (Taken 05/27/2022 1153) LTG: Pt will propel w/c in controlled environ  assist needed:: Supervision/Verbal cueing LTG: Propel w/c distance in controlled environment: 150 ft Goal: LTG Patient will propel w/c in home environment (PT) Description: LTG: Patient will propel wheelchair in home environment, # of feet with assistance (PT). 05/27/2022 1159 by Hilary Hertz, Student-PT Flowsheets (Taken 05/27/2022 1159) Distance: wheelchair distance in  controlled environment: 150 LTG: Propel w/c distance in home environment: 100 05/27/2022 1153 by Hilary Hertz, Student-PT Flowsheets (Taken 05/27/2022 1153) LTG: Pt will propel w/c in home environ  assist needed:: Supervision/Verbal cueing Distance: wheelchair distance in controlled environment: 100 LTG: Propel w/c  distance in home environment: 150 Goal: LTG Patient will propel w/c in community environment (PT) Description: LTG: Patient will propel wheelchair in community environment, # of feet with assist (PT) Flowsheets (Taken 05/27/2022 1153) LTG: Pt will propel w/c in community environ  assist needed:: Supervision/Verbal cueing Distance: wheelchair distance in controlled environment: 150 [id number] w/c distance in community environment: 100   Problem: RH Stairs Goal: LTG Patient will ambulate up and down stairs w/assist (PT) Description: LTG: Patient will ambulate up and down # of stairs with assistance (PT) Flowsheets (Taken 05/27/2022 1153) LTG: Pt will ambulate up/down stairs assist needed:: Minimal Assistance - Patient > 75% LTG: Pt will  ambulate up and down number of stairs: 4 6 inch step using  both hand rails   Problem: RH Attention Goal: LTG Patient will demonstrate this level of attention during functional activites (PT) Description: LTG:  Patient will demonstrate this level of attention during functional activites (PT) Flowsheets (Taken 05/27/2022 1153) Patient will demonstrate this level of attention during functional activites: Focused Patient will demonstrate above attention level in the following environment:  Controlled  Home  Community LTG: Patient will demonstrate attention during functional mobility with assistance of: Modified Independent   Problem: RH Awareness Goal: LTG: Patient will demonstrate awareness during functional activites type of (PT) Description: LTG: Patient will demonstrate awareness during functional activites type of (PT) Flowsheets (Taken  05/27/2022 1153) Patient will demonstrate awareness during functional activites type of: Emergent LTG: Patient will demonstrate awareness during functional activites type of (PT): Modified Independent  Vena Rua PT, SPT

## 2022-05-27 NOTE — Progress Notes (Signed)
PROGRESS NOTE   Subjective/Complaints:  No issues overnite , OT in room, pt states he was smoking until hospital admission ROS- neg CP, SOB, N/V/D Objective:   No results found. Recent Labs    05/25/22 0651 05/27/22 0620  WBC 6.8 5.8  HGB 11.8* 11.9*  HCT 36.1* 36.0*  PLT 113* 134*   Recent Labs    05/25/22 0651 05/27/22 0620  NA 136 139  K 3.5 3.8  CL 103 105  CO2 23 27  GLUCOSE 164* 151*  BUN 9 9  CREATININE 1.29* 1.45*  CALCIUM 8.7* 8.9    Intake/Output Summary (Last 24 hours) at 05/27/2022 0931 Last data filed at 05/26/2022 1838 Gross per 24 hour  Intake 240 ml  Output --  Net 240 ml        Physical Exam: Vital Signs Blood pressure 123/89, pulse 80, temperature (!) 97.5 F (36.4 C), temperature source Oral, resp. rate 19, height 6\' 2"  (1.88 m), SpO2 94 %.   General: No acute distress Mood and affect are appropriate Heart: Regular rate and rhythm no rubs murmurs or extra sounds Lungs: Clear to auscultation, breathing unlabored, occ bibasilar wheezes Abdomen: Positive bowel sounds, soft nontender to palpation, nondistended Extremities: No clubbing, cyanosis, or edema Skin: No evidence of breakdown, no evidence of rash Neurologic: Cranial nerves II through XII intact, motor strength is 4/5 in bilateral deltoid, bicep, tricep, grip, hip flexor, knee extensors, ankle dorsiflexor and plantar flexor Sensory exam reduced  sensation to light touch  in bilateral upper and lower extremities Cerebellar exam normal finger to nose to finger as well as heel to shin in right  upper and lower extremities Dysmetria LUE and LLE Musculoskeletal: right hand intrinsic atrophy    Assessment/Plan: 1. Functional deficits which require 3+ hours per day of interdisciplinary therapy in a comprehensive inpatient rehab setting. Physiatrist is providing close team supervision and 24 hour management of active medical problems  listed below. Physiatrist and rehab team continue to assess barriers to discharge/monitor patient progress toward functional and medical goals  Care Tool:  Bathing              Bathing assist       Upper Body Dressing/Undressing Upper body dressing        Upper body assist      Lower Body Dressing/Undressing Lower body dressing            Lower body assist       Toileting Toileting    Toileting assist       Transfers Chair/bed transfer  Transfers assist           Locomotion Ambulation   Ambulation assist              Walk 10 feet activity   Assist           Walk 50 feet activity   Assist           Walk 150 feet activity   Assist           Walk 10 feet on uneven surface  activity   Assist           Wheelchair  Assist               Wheelchair 50 feet with 2 turns activity    Assist            Wheelchair 150 feet activity     Assist          Blood pressure 123/89, pulse 80, temperature (!) 97.5 F (36.4 C), temperature source Oral, resp. rate 19, height 6\' 2"  (1.88 m), SpO2 94 %.  Medical Problem List and Plan: 1. Functional deficits secondary to subacute right MCA ischemic infarction             -patient may  shower             -ELOS/Goals:  10 to 14 days , mod I to sup with PT/OT, sup with SLP             -Admit to CIR 2.  Antithrombotics: -DVT/anticoagulation:  Pharmaceutical: Eliquis             -antiplatelet therapy: Aspirin 81 mg daily 3. Pain Management: Neurontin 300 mg 3 times daily, Elavil 75mg  nightly, Flexeril 10 mg nightly as needed             -Consider increase dose neurontin  for neuropathic pain 4. Mood/Behavior/Sleep: Lexapro 10 mg nightly, Elavil 75 mg nightly.  Provide emotional support             -antipsychotic agents: N/A 5. Neuropsych/cognition: This patient is capable of making decisions on his own behalf. 6. Skin/Wound Care: Routine skin  checks 7. Fluids/Electrolytes/Nutrition: Routine in and outs with follow-up chemistries 8.  Atrial fibrillation/AICD.  Continue amiodarone 200 mg daily as well as Eliquis.  Cardiac rate controlled 9.  Diabetes mellitus.  Hemoglobin A1c 9.9.  Semglee 32 units nightly.  SSI. Check blood sugars before meals and at bedtime CBG (last 3)  Recent Labs    05/26/22 1616 05/26/22 2137 05/27/22 0617  GLUCAP 244* 274* 159*  Am CBG in range , cont SSI  10.  Hyperlipidemia.  Lipitor 40mg  daily 11.  Chronic systolic congestive heart failure.  Monitor for any signs of fluid overload. On Furosemide 20mg  daily.  12.  BPH.  Hytrin 5 mg twice daily 13.  Tobacco use.  Provide counseling 14.  Hypothyroidism.  Synthroid 15.  CKD.  Baseline creatinine 1.43-1.94.  Follow-up chemistries 16. HTN. Long term goal normotensive. Home medications metoprolol and cozaar currently on hold. Monitor with activity. Vitals:   05/27/22 0515 05/27/22 0924  BP: 124/76 123/89  Pulse: 80 80  Resp: 19   Temp: (!) 97.5 F (36.4 C)   SpO2: 94%     17. Hypothyroidism. Continue synthroid    LOS: 1 days A FACE TO FACE EVALUATION WAS PERFORMED  05/27/2022, 9:31 AM

## 2022-05-27 NOTE — Plan of Care (Signed)
  Problem: RH Problem Solving Goal: LTG Patient will demonstrate problem solving for (SLP) Description: LTG:  Patient will demonstrate problem solving for basic/complex daily situations with cues  (SLP) Flowsheets (Taken 05/27/2022 1722) LTG: Patient will demonstrate problem solving for (SLP): Basic daily situations LTG Patient will demonstrate problem solving for: Minimal Assistance - Patient > 75%   Problem: RH Memory Goal: LTG Patient will demonstrate ability for day to day (SLP) Description: LTG:   Patient will demonstrate ability for day to day recall/carryover during cognitive/linguistic activities with assist  (SLP) Flowsheets (Taken 05/27/2022 1722) LTG: Patient will demonstrate ability for day to day recall/carryover during cognitive/linguistic activities with assist (SLP): Minimal Assistance - Patient > 75% Goal: LTG Patient will use memory compensatory aids to (SLP) Description: LTG:  Patient will use memory compensatory aids to recall biographical/new, daily complex information with cues (SLP) Flowsheets (Taken 05/27/2022 1722) LTG: Patient will use memory compensatory aids to (SLP):  Minimal Assistance - Patient > 75%  Supervision   Problem: RH Attention Goal: LTG Patient will demonstrate this level of attention during functional activites (SLP) Description: LTG:  Patient will will demonstrate this level of attention during functional activites (SLP) Flowsheets (Taken 05/27/2022 1722) Patient will demonstrate during cognitive/linguistic activities the attention type of: Selective Patient will demonstrate this level of attention during cognitive/linguistic activities in: Controlled LTG: Patient will demonstrate this level of attention during cognitive/linguistic activities with assistance of (SLP): Supervision   Problem: RH Awareness Goal: LTG: Patient will demonstrate awareness during functional activites type of (SLP) Description: LTG: Patient will demonstrate awareness during  functional activites type of (SLP) Flowsheets (Taken 05/27/2022 1722) Patient will demonstrate during cognitive/linguistic activities awareness type of: Emergent LTG: Patient will demonstrate awareness during cognitive/linguistic activities with assistance of (SLP): Minimal Assistance - Patient > 75%

## 2022-05-27 NOTE — Plan of Care (Cosign Needed)
Problem: RH Balance Goal: LTG Patient will maintain dynamic sitting balance (PT) Description: LTG:  Patient will maintain dynamic sitting balance with assistance during mobility activities (PT) Flowsheets (Taken 05/27/2022 1153) LTG: Pt will maintain dynamic sitting balance during mobility activities with:: Supervision/Verbal cueing Goal: LTG Patient will maintain dynamic standing balance (PT) Description: LTG:  Patient will maintain dynamic standing balance with assistance during mobility activities (PT) Flowsheets (Taken 05/27/2022 1153) LTG: Pt will maintain dynamic standing balance during mobility activities with:: Contact Guard/Touching assist   Problem: Sit to Stand Goal: LTG:  Patient will perform sit to stand with assistance level (PT) Description: LTG:  Patient will perform sit to stand with assistance level (PT) Flowsheets (Taken 05/27/2022 1153) LTG: PT will perform sit to stand in preparation for functional mobility with assistance level: Supervision/Verbal cueing   Problem: RH Bed Mobility Goal: LTG Patient will perform bed mobility with assist (PT) Description: LTG: Patient will perform bed mobility with assistance, with/without cues (PT). Flowsheets (Taken 05/27/2022 1153) LTG: Pt will perform bed mobility with assistance level of: Supervision/Verbal cueing   Problem: RH Bed to Chair Transfers Goal: LTG Patient will perform bed/chair transfers w/assist (PT) Description: LTG: Patient will perform bed to chair transfers with assistance (PT). Flowsheets (Taken 05/27/2022 1153) LTG: Pt will perform Bed to Chair Transfers with assistance level: Contact Guard/Touching assist   Problem: RH Floor Transfers Goal: LTG Patient will perform floor transfers w/assist (PT) Description: LTG: Patient will perform floor transfers with assistance (PT). Flowsheets (Taken 05/27/2022 1153) LTG: PT WILL PERFORM FLOOR TRANFERS  WITH  ASSIST:: Minimal Assistance - Patient > 75%   Problem: RH  Ambulation Goal: LTG Patient will ambulate in controlled environment (PT) Description: LTG: Patient will ambulate in a controlled environment, # of feet with assistance (PT). Flowsheets (Taken 05/27/2022 1153) LTG: Pt will ambulate in controlled environ  assist needed:: Supervision/Verbal cueing LTG: Ambulation distance in controlled environment: ambulate 75 ft w LRAD Goal: LTG Patient will ambulate in home environment (PT) Description: LTG: Patient will ambulate in home environment, # of feet with assistance (PT). Flowsheets (Taken 05/27/2022 1153) LTG: Pt will ambulate in home environ  assist needed:: Supervision/Verbal cueing LTG: Ambulation distance in home environment: ambulate 50 ft w LRAD Goal: LTG Patient will ambulate in community environment (PT) Description: LTG: Patient will ambulate in community environment, # of feet with assistance (PT). Flowsheets (Taken 05/27/2022 1153) LTG: Pt will ambulate in community environ  assist needed:: Supervision/Verbal cueing LTG: Ambulation distance in community environment: Ambulate 50 ft using LRAD   Problem: RH Wheelchair Mobility Goal: LTG Patient will propel w/c in controlled environment (PT) Description: LTG: Patient will propel wheelchair in controlled environment, # of feet with assist (PT) Flowsheets (Taken 05/27/2022 1153) LTG: Pt will propel w/c in controlled environ  assist needed:: Supervision/Verbal cueing LTG: Propel w/c distance in controlled environment: 150 ft Goal: LTG Patient will propel w/c in home environment (PT) Description: LTG: Patient will propel wheelchair in home environment, # of feet with assistance (PT). 05/27/2022 1159 by Vena Rua, Student-PT Flowsheets (Taken 05/27/2022 1159) Distance: wheelchair distance in controlled environment: 150 LTG: Propel w/c distance in home environment: 100 05/27/2022 1153 by Vena Rua, Student-PT Flowsheets (Taken 05/27/2022 1153) LTG: Pt will propel w/c in home environ   assist needed:: Supervision/Verbal cueing Distance: wheelchair distance in controlled environment: 100 LTG: Propel w/c distance in home environment: 150 Goal: LTG Patient will propel w/c in community environment (PT) Description: LTG: Patient will propel wheelchair in community environment, # of feet  with assist (PT) Flowsheets (Taken 05/27/2022 1153) LTG: Pt will propel w/c in community environ  assist needed:: Supervision/Verbal cueing Distance: wheelchair distance in controlled environment: 150 [id number] w/c distance in community environment: 100   Problem: RH Stairs Goal: LTG Patient will ambulate up and down stairs w/assist (PT) Description: LTG: Patient will ambulate up and down # of stairs with assistance (PT) Flowsheets (Taken 05/27/2022 1153) LTG: Pt will ambulate up/down stairs assist needed:: Minimal Assistance - Patient > 75% LTG: Pt will  ambulate up and down number of stairs: 4 6 inch step using  both hand rails   Problem: RH Attention Goal: LTG Patient will demonstrate this level of attention during functional activites (PT) Description: LTG:  Patient will demonstrate this level of attention during functional activites (PT) Flowsheets (Taken 05/27/2022 1153) Patient will demonstrate this level of attention during functional activites: Focused Patient will demonstrate above attention level in the following environment:  Controlled  Home  Community LTG: Patient will demonstrate attention during functional mobility with assistance of: Modified Independent   Problem: RH Awareness Goal: LTG: Patient will demonstrate awareness during functional activites type of (PT) Description: LTG: Patient will demonstrate awareness during functional activites type of (PT) Flowsheets (Taken 05/27/2022 1153) Patient will demonstrate awareness during functional activites type of: Emergent LTG: Patient will demonstrate awareness during functional activites type of (PT): Modified Independent

## 2022-05-27 NOTE — Progress Notes (Signed)
Inpatient Rehabilitation Center Individual Statement of Services  Patient Name:  Grant [name]  Date:  05/27/2022  Welcome to the Inpatient Rehabilitation Center.  Our goal is to provide you with an individualized program based on your diagnosis and situation, designed to meet your specific needs.  With this comprehensive rehabilitation program, you will be expected to participate in at least 3 hours of rehabilitation therapies Monday-Friday, with modified therapy programming on the weekends.  Your rehabilitation program will include the following services:  Physical Therapy (PT), Occupational Therapy (OT), Speech Therapy (ST), 24 hour per day rehabilitation nursing, Therapeutic Recreaction (TR), Neuropsychology, Care Coordinator, Rehabilitation Medicine, Nutrition Services, Pharmacy Services, and Other  Weekly team conferences will be held on Wednesdays to discuss your progress.  Your Inpatient Rehabilitation Care Coordinator will talk with you frequently to get your input and to update you on team discussions.  Team conferences with you and your family in attendance may also be held.  Expected length of stay: 10-14 Days  Overall anticipated outcome:  Supervision to Min A  Depending on your progress and recovery, your program may change. Your Inpatient Rehabilitation Care Coordinator will coordinate services and will keep you informed of any changes. Your Inpatient Rehabilitation Care Coordinator's name and contact numbers are listed  below.  The following services may also be recommended but are not provided by the Inpatient Rehabilitation Center:   Home Health Rehabiltiation Services Outpatient Rehabilitation Services    Arrangements will be made to provide these services after discharge if needed.  Arrangements include referral to agencies that provide these services.  Your insurance has been verified to be:  Retail banker primary doctor is:  [employer] at  [employer]  Pertinent information will be shared with your doctor and your insurance company.  Inpatient Rehabilitation Care Coordinator:  Lavera Guise, [place] [phone] or [phone]  Information discussed with and copy given to patient by: Andria Rhein, 05/27/2022, 11:25 AM

## 2022-05-27 NOTE — Progress Notes (Signed)
Inpatient Rehabilitation  Patient information reviewed and entered into eRehab system by Yolette Hastings M. Delicia Berens, M.A., CCC/SLP, PPS Coordinator.  Information including medical coding, functional ability and quality indicators will be reviewed and updated through discharge.    

## 2022-05-27 NOTE — Progress Notes (Signed)
Inpatient Rehabilitation Care Coordinator Assessment and Plan Patient Details  Name: Grant [name] MRN: [id number] Date of Birth: [birth year]  Today's Date: 05/27/2022  Hospital Problems: Principal Problem:   Right middle cerebral artery stroke [facility])  Past Medical History:  Past Medical History:  Diagnosis Date   AICD (automatic cardioverter/defibrillator) present    BPH (benign prostatic hyperplasia)    CAD (coronary artery disease)    DM (diabetes mellitus) ([place])    HLD (hyperlipidemia)    Hyperglycemia 03/09/2017   Hypothyroidism    NSVT (nonsustained ventricular tachycardia) ([place])    Obstructive sleep apnea    Past Surgical History:  Past Surgical History:  Procedure Laterality Date   ABDOMINAL SURGERY  1981   repair lungs, liver, and intenstines from trauma   Social History:  reports that he has been smoking cigarettes. He has a 32.00 pack-year smoking history. His smokeless tobacco use includes chew. He reports current alcohol use. He reports that he does not currently use drugs after having used the following drugs: Marijuana.  Family / Support Systems Marital Status: Married Patient Roles: Spouse Spouse/Significant Other: Chiropractor Anticipated Caregiver: Spouse Ability/Limitations of Caregiver: SUP TO MIN A Caregiver Availability: 24/7 Family Dynamics: support from spouse  Social History Preferred language: English Religion: [employer] - How often do you need to have someone help you when you read instructions, pamphlets, or other written material from your doctor or pharmacy?: Never Writes: Yes   Abuse/Neglect Abuse/Neglect Assessment Can Be Completed: Yes Physical Abuse: Denies Verbal Abuse: Denies Sexual Abuse: Denies Exploitation of patient/patient's resources: Denies Self-Neglect: Denies  Patient response to: Social Isolation - How often do you feel lonely or isolated from those around you?: Never  Emotional  Status Recent Psychosocial Issues: coping Psychiatric History: n/a Substance Abuse History: n/a  Patient / Family Perceptions, Expectations & Goals Pt/Family understanding of illness & functional limitations: yes Premorbid pt/family roles/activities: Patient previously MOD I overall Anticipated changes in roles/activities/participation: spouse able to assist up to a MIN A with care Pt/family expectations/goals: MOD I to [place] Agencies: None Premorbid Home Care/DME Agencies: Other (Comment) ([facility], [facility],) Is the patient able to respond to transportation needs?: Yes In the past 12 months, has lack of transportation kept you from medical appointments or from getting medications?: No In the past 12 months, has lack of transportation kept you from meetings, work, or from getting things needed for daily living?: No  Discharge Planning Living Arrangements: Spouse/significant other Support Systems: Spouse/significant other Type of Residence: Private residence Insurance Resources: Multimedia programmer (specify) ([place]) Financial Resources: [place], Family Support, SSD Financial Screen Referred: No Living Expenses: Own Money Management: Patient, Spouse Does the patient have any problems obtaining your medications?: No Home Management: Independent Patient/Family Preliminary Plans: Spouse able to assist with cogntive tasks Care Coordinator Barriers to Discharge: Decreased caregiver support, Lack of/limited family support, Home environment access/layout Care Coordinator Anticipated Follow Up Needs: HH/OP Expected length of stay: 10-14 Days  Clinical Impression SW met with patient, introduced self and explained role. Patient anticipates discharging home with spouse wh  is able to provide sup/Min A level of care at home. No additional questions or concerns currently.   Dyanne Iha 05/27/2022, 1:49 PM

## 2022-05-27 NOTE — Evaluation (Signed)
Physical Therapy Assessment and Plan  Patient Details  Name: Grant [name] MRN: [id number] Date of Birth: September 12, 1945  PT Diagnosis: Abnormal posture, Abnormality of gait, Ataxia, Ataxic gait, Coordination disorder, Difficulty walking, Hemiparesis non-dominant, and Impaired sensation Rehab Potential: Excellent ELOS: 12-14 days   Today's Date: 05/27/2022 PT Individual Time: 0802 - 0915 PT Individual Time Calculation (min): 44mn      Hospital Problem: Principal Problem:   Right middle cerebral artery stroke ([facility]   Past Medical History:  Past Medical History:  Diagnosis Date   AICD (automatic cardioverter/defibrillator) present    BPH (benign prostatic hyperplasia)    CAD (coronary artery disease)    DM (diabetes mellitus) ([place]    HLD (hyperlipidemia)    Hyperglycemia 03/09/2017   Hypothyroidism    NSVT (nonsustained ventricular tachycardia) ([facility]    Obstructive sleep apnea    Past Surgical History:  Past Surgical History:  Procedure Laterality Date   ABDOMINAL SURGERY  1981   repair lungs, liver, and intenstines from trauma    Assessment & Plan Clinical Impression: Grant Bozzois a 76year old right-handed male with history significant for CKD with creatinine baseline 11.93-7.90chronic systolic congestive heart failure with AICD, A-fib on Eliquis as well as amiodarone, BPH, CAD maintained on low-dose aspirin, diabetes mellitus, tobacco use,  and hyperlipidemia.  Per chart review patient lives with spouse.  1 level home 2 steps to entry.  Independent with a cane prior to admission.  By [place]  His left shoulder was hurting with throbbing pains.  He noticed numbness along the entire left side of his body.  Each day became a little worse he did not want to go to the hospital initially.  There was reports of a couple of falls.  Patient returned to the area presented 05/20/2022 with increasing left-sided weakness.   Cranial CT scan showed no acute intracranial abnormality.  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  No intracranial large vessel occlusion.  MRI not completed due to AICD.  Admission chemistries unremarkable set potassium 3.3 glucose 214 creatinine 1.32, hemoglobin 10.7, alcohol 15, urine drug screen negative, hemoglobin A1c 9.9.   Echocardiogram ejection fraction of 60 to 65% no wall motion abnormalities grade 1 diastolic dysfunction.  Neurology follow-up films reviewed suspect subacute right thalamic cardioembolic infarct.  He reports his strength is gradually improving. He says he has electric stock type pain in his LUE. Patient currently maintained on low-dose aspirin with Eliquis ongoing at 5 mg twice daily.  Tolerating a regular consistency diet.  Therapy evaluations completed due to patient's left-sided weakness decreased functional mobility was admitted for a comprehensive rehab program.   Patient currently requires min with mobility secondary to muscle weakness, muscle joint tightness, and muscle paralysis, impaired timing and sequencing, unbalanced muscle activation, motor apraxia, ataxia, decreased coordination, and decreased motor planning, and decreased sitting balance, decreased standing balance, decreased postural control, hemiplegia, decreased balance strategies, and difficulty maintaining precautions.  Prior to hospitalization, patient was modified independent  with mobility and lived with Spouse in a House home.  Home access is 2Stairs to enter.  Patient will benefit from skilled PT intervention to maximize safe functional mobility, minimize fall risk, and decrease caregiver burden for planned discharge home with 24 hour supervision.  Anticipate patient will benefit from follow up OP at discharge.  PT - End of Session Activity Tolerance: Tolerates 30+ min  activity with multiple rests Endurance Deficit: Yes Endurance Deficit Description: Decreased PT Assessment Rehab Potential (ACUTE/IP ONLY): Excellent PT Barriers to Discharge: Neurogenic Bowel & Bladder;Behavior (Behavior: Aware of enviornment on left but nelgect with left side of body) PT Patient demonstrates impairments in the following area(s): Balance;Endurance;Behavior;Motor;Safety;Sensory PT Transfers Functional Problem(s): Bed Mobility;Bed to Chair;Car;Furniture;Floor PT Locomotion Functional Problem(s): Ambulation;Wheelchair Mobility;Stairs PT Plan PT Intensity: Minimum of 1-2 x/day ,45 to 90 minutes PT Frequency: 5 out of 7 days PT Duration Estimated Length of Stay: 12-14 days PT Treatment/Interventions: Ambulation/gait training;Cognitive remediation/compensation;Discharge planning;DME/adaptive equipment instruction;Functional mobility training;Pain management;Psychosocial support;Therapeutic Activities;UE/LE Strength taining/ROM;Visual/perceptual remediation/compensation;Balance/vestibular training;Community reintegration;Disease management/prevention;Functional electrical stimulation;Neuromuscular re-education;Patient/family education;Stair training;Therapeutic Exercise;UE/LE Coordination activities;Wheelchair propulsion/positioning PT Transfers Anticipated Outcome(s): Supervison/ CGA  using LRAD PT Locomotion Anticipated Outcome(s): Supervison/ using LRAD PT Recommendation Recommendations for Other Services: Neuropsych consult;Therapeutic Recreation consult;Speech consult Therapeutic Recreation Interventions: Clinical cytogeneticist;Outing/community reintergration Follow Up Recommendations: Outpatient PT Patient destination: Home Equipment Recommended: Cane;Rolling walker with 5" wheels;To be determined;Wheelchair cushion (measurements);Wheelchair (measurements)   PT Evaluation Precautions/Restrictions Precautions Precautions: Fall Precaution Comments: LT wrist pain  with WB (fall 2 months ago on hand. No imaging) Restrictions Weight Bearing Restrictions: No General   Vital SignsTherapy Vitals Pulse Rate: 80 BP: 123/89 Pain Pain Assessment Pain Scale: 0-10 Pain Score: 0-No pain Multiple Pain Sites: No Pain Interference Pain Interference Pain Effect on Sleep: 1. Rarely or not at all Pain Interference with Therapy Activities: 1. Rarely or not at all Pain Interference with Day-to-Day Activities: 1. Rarely or not at all Home Living/Prior [place]: Spouse/significant other Available Help at Discharge: Family;Available 24 hours/day Type of Home: House Home Access: Stairs to enter [employer] of Steps: 2 Entrance Stairs-Rails: Can reach both Home Layout: One level Bathroom Shower/Tub: Multimedia programmer: Standard Bathroom Accessibility: Yes Additional Comments: grab bars, no chair in shower  Lives With: Spouse Prior Function Level of Independence: Independent with basic ADLs;Requires assistive device for independence;Independent with transfers;Independent with gait (Mentions using SPC for ambulating)  Able to Take Stairs?: Yes Driving: Yes Vocation: Retired Leisure:  (Enjoys Research scientist (physical sciences)) Vision/Perception  Vision - History Ability to See in Adequate Light: 1 Impaired Vision - Assessment Eye Alignment: Within Functional Limits Alignment/Gaze Preference: Within Defined Limits Tracking/Visual Pursuits: Able to track stimulus in all quads without difficulty Convergence: Within functional limits Additional Comments: Pt does not neglect left side of enviornment but does neglect left side of his body Perception Perception: Impaired Inattention/Neglect: Does not attend to left side of body Praxis Praxis: Impaired Praxis Impairment Details: Motor planning (Pt shows decrease motor planning with left UE)  Cognition Overall Cognitive Status: Within Functional Limits for  tasks assessed Arousal/Alertness: Awake/alert Orientation Level: Oriented X4 Year: 2023 Month: December Day of Week: Incorrect (Thought it was Monday instead of Thursday) Attention: Sustained Sustained Attention: Appears intact Selective Attention: Impaired Selective Attention Impairment: Verbal basic Memory: Appears intact Awareness: Appears intact Problem Solving: Impaired Problem Solving Impairment: Functional basic Safety/Judgment: Impaired Comments: Pt neglects left side of his body, unware of where his left hand  in space Sensation Sensation Light Touch: Impaired by gross assessment Hot/Cold: Not tested Proprioception: Impaired by gross assessment Stereognosis: Impaired by gross assessment Additional Comments: Decrease sensation on L side of body, unable accuratley sense light touch or sustained pressure on L side of boyd Coordination Gross Motor Movements are Fluid and Coordinated: Yes Fine Motor Movements are Fluid and Coordinated: Yes Finger Nose  Finger Test: Mild Dysmetria on left Heel Shin Test: Decreased ROM left with Mild Dysmetria Motor  Motor Motor: Ataxia;Abnormal postural alignment and control;Motor apraxia;Hemiplegia   Trunk/Postural Assessment  Cervical Assessment Cervical Assessment: Within Functional Limits Thoracic Assessment Thoracic Assessment: Exceptions to [facility] (Rounded shoulders) Lumbar Assessment Lumbar Assessment: Within Functional Limits Postural Control Postural Control: Deficits on evaluation  Balance Balance Balance Assessed: Yes Static Sitting Balance Static Sitting - Balance Support: Bilateral upper extremity supported;No upper extremity supported Static Sitting - Level of Assistance: 5: Stand by assistance Dynamic Sitting Balance Dynamic Sitting - Balance Support: Bilateral upper extremity supported Dynamic Sitting - Level of Assistance: 4: Min assist (Left side neglect. Required assitance with left arm during lateral scoot/ weight  shifts) Dynamic Sitting - Balance Activities: Lateral lean/weight shifting;Forward lean/weight shifting Sitting balance - Comments: Posterior LOB when raising UE Static Standing Balance Static Standing - Balance Support: Bilateral upper extremity supported (Required assistance with maintaining L hand on RW) Static Standing - Level of Assistance: 4: Min assist Dynamic Standing Balance Dynamic Standing - Level of Assistance: 2: Max assist Dynamic Standing - Comments: left lean Extremity Assessment  RUE Assessment Active Range of Motion (AROM) Comments: WFL but very slow to achieve full ROM. movements are labored. General Strength Comments: 4-/5 LUE Assessment Passive Range of Motion (PROM) Comments: sh flexion to 160 Active Range of Motion (AROM) Comments: sh flexion 70. elbow [facility] General Strength Comments: 3+/ 5 throughout - incoordinated movement, limited sensation RLE Assessment RLE Assessment: Within Functional Limits General Strength Comments: 4+/5 for knee flexion/extension, DF, & PF,  4+5 for all hip directions LLE Assessment LLE Assessment: Exceptions to [facility] LLE Strength LLE Overall Strength: Deficits Left Hip Flexion: 3/5 Left Hip Extension: 3+/5 Left Hip ABduction: 3+/5 Left Hip ADduction: 4-/5 Left Knee Flexion: 4-/5 Left Knee Extension: 4-/5 Left Ankle Dorsiflexion: 3+/5 Left Ankle Plantar Flexion: 4-/5  Care Tool Care Tool Bed Mobility Roll left and right activity   Roll left and right assist level: Contact Guard/Touching assist    Sit to lying activity   Sit to lying assist level: Minimal Assistance - Patient > 75%    Lying to sitting on side of bed activity   Lying to sitting on side of bed assist level: the ability to move from lying on the back to sitting on the side of the bed with no back support.: Minimal Assistance - Patient > 75%     Care Tool Transfers Sit to stand transfer   Sit to stand assist level: Minimal Assistance - Patient > 75% (Required  consistent verbal and tactile cuing)    Chair/bed transfer   Chair/bed transfer assist level: Minimal Assistance - Patient > 75% (Required consistent verbal and tactile cuing)     Toilet transfer   Assist Level: Moderate Assistance - Patient 50 - 74%    Car transfer   Car transfer assist level: Moderate Assistance - Patient 50 - 74% (Required consistent verbal and tactile cuing)      Care Tool Locomotion Ambulation   Assist level: Minimal Assistance - Patient > 75% Assistive device: Walker-rolling (required assitance w/ maintaing left hand on walker and RW navigation) Max distance: 34f  Walk 10 feet activity   Assist level: Minimal Assistance - Patient > 75% (required assitance w/ maintaing left hand on walker and RW navigation) Assistive device: Walker-rolling   Walk 50 feet with 2 turns activity   Assist level: Minimal Assistance - Patient > 75% (required assitance w/ maintaing left hand on walker  and RW navigation) Assistive device: Walker-rolling  Walk 150 feet activity Walk 150 feet activity did not occur: Safety/medical concerns      Walk 10 feet on uneven surfaces activity Walk 10 feet on uneven surfaces activity did not occur: Safety/medical concerns      Stairs Stair activity did not occur: Safety/medical concerns        Walk up/down 1 step activity Walk up/down 1 step or curb (drop down) activity did not occur: Safety/medical concerns      Walk up/down 4 steps activity Walk up/down 4 steps activity did not occur: Safety/medical concerns      Walk up/down 12 steps activity Walk up/down 12 steps activity did not occur: Safety/medical concerns      Pick up small objects from floor Pick up small object from the floor (from standing position) activity did not occur: Safety/medical concerns      Wheelchair Is the patient using a wheelchair?: Yes Type of Wheelchair: Manual   Wheelchair assist level: Moderate Assistance - Patient 50 - 74% Max wheelchair distance:  16f (Using RUE & RLE)  Wheel 50 feet with 2 turns activity   Assist Level: Moderate Assistance - Patient 50 - 74%  Wheel 150 feet activity   Assist Level: Maximal Assistance - Patient 25 - 49%    Refer to Care Plan for Long Term Goals  SHORT TERM GOAL WEEK 1 PT Short Term Goal 1 (Week 1): Pt wil perfrom bed/chair transfers w/ supervison assist using LRAD PT Short Term Goal 2 (Week 1): Pt ambulate 75 ft w/ supervsion assist using LRAD PT Short Term Goal 3 (Week 1): Pt will naviagte 4 6 inch steps w/ min A while using hand rails PT Short Term Goal 4 (Week 1): Pt wil perfrom  functional otucome measure to predict fall risk PT Short Term Goal 5 (Week 1): Pt will perfrom bed mobility conistently with supervsion  Recommendations for other services: Neuropsych and Therapeutic Recreation  Kitchen group, Stress management, and Outing/community reintegration  Skilled Therapeutic Intervention Mobility Bed Mobility Bed Mobility: Rolling Left;Supine to Sit;Sitting - Scoot to EM[place] [employer] Ils[place] Bed Rolling Left: Minimal Assistance - Patient > 75% Supine to Sit: Minimal Assistance - Patient > 75% (Pt reprots increase lightheadedness/dizziness during transition) Sitting - Scoot to Edge of Bed: Minimal Assistance - Patient > 75% Transfers Transfers: Sit to Stand;Stand to SLockheed MartinTransfers;Transfer Sit to Stand: Minimal Assistance - Patient > 75% (Pt reprots increase lightheadedness/dizziness during transition) Stand to Sit: Minimal Assistance - Patient > 75% Stand Pivot Transfers: Minimal Assistance - Patient > 75% Stand Pivot Transfer Details: Tactile cues for initiation;Tactile cues for placement;Visual cues for safe use of DME/AE;Visual cues/gestures for sequencing;Verbal cues for technique;Verbal cues for gait pattern;Tactile cues for sequencing;Tactile cues for posture;Visual cues/gestures for precautions/safety;Verbal cues for sequencing;Verbal cues for precautions/safety;Verbal cues for safe use of  DME/AE Transfer (Assistive device): Rolling walker Locomotion  Gait Ambulation: Yes Gait Assistance:  (Min assit with RW navigation) Gait Distance (Feet): 60 Feet Assistive device: Rolling walker Gait Assistance Details: Tactile cues for initiation;Tactile cues for sequencing;Tactile cues for posture;Tactile cues for weight beaing;Visual cues/gestures for precautions/safety;Verbal cues for sequencing;Verbal cues for precautions/safety;Verbal cues for safe use of DME/AE;Verbal cues for gait pattern;Visual cues/gestures for sequencing;Visual cues for safe use of DME/AE;Tactile cues for placement;Verbal cues for technique Gait Assistance Details: Pt required assitance with maintaining L hand on RW during ambulation, Gait Gait: Yes Gait Pattern: Impaired Gait Pattern: Step-through pattern;Step-to pattern;Decreased step length - right;Ataxic;Shuffle;Festinating;Poor foot clearance -  left;Decreased stride length;Decreased dorsiflexion - left;Decreased hip/knee flexion - left;Antalgic;Narrow base of support Gait velocity: Decreased High Level Ambulation High Level Ambulation: Side stepping;Sudden stops;Direction changes Side Stepping: Performed at EOB Direction Changes: Increased time to complete Sudden Stops: Pt abe to stop on command to reset stepping cadence Stairs / Additional Locomotion Stairs: No Wheelchair Mobility Wheelchair Mobility: Yes Wheelchair Assistance: Moderate Assistance - Patient 50 - 74% Wheelchair Propulsion: Right upper extremity;Right lower extremity (Attempted left UE, unable to effeectively grib w/C, unware where arm is in enviornment) Wheelchair Parts Management: Needs assistance Distance: 50  Skilled therapeutic Interventions/ Progress Updates Pt is supine sleeping in bed on entrance. Pt alert and agreeable for treatment session after verbal stimuli. Pt fitted for wheelchair and provided with extra gown. Pt transported to therapy gym for time management and energy  conservation. Pt demonstrated supine to sit with min A and sit to stand using RW with min A. Patient requires assistance to maintain left hand on RW with verbal and tactile cuing for foot postioning, weight shifts, forward trunk lean, and hand placement. Pt demonstrated stand pivot, and ambulatory transfers with min A using RW throughout treatment session, required verbal and tactile cuing for foot positioning and hand placement for improved safety. Gross assessment of strength, vision, and sensation was performed during evaluation, refer above for details. Pt ambulated 60 ft with min A using RW. Demonstrated decrease gait, with poor foot clearance, decrease step length, and step width on right. Pt shows occasional festination that was corrected after verbal cue and reset. Pt self propelled wc 50 ft using RUE & RUE requiring mod A for navigation and propulsion. Attempted using LUE but unable to maintain hand grip. Demonstrated car transfer using RW with mod A requiring verbal and tactile for foot postioning, and hand placement. Transported back to room and performed stand pivot transfer to EOB using RW with min A. Patient demonstrated lateral scoot to the right with CGA.   Patient semi reclined in bed with alarm on, call/bell close by and all needs met.   Discharge Criteria: Patient will be discharged from PT if patient refuses treatment 3 consecutive times without medical reason, if treatment goals not met, if there is a change in medical status, if patient makes no progress towards goals or if patient is discharged from hospital.  The above assessment, treatment plan, treatment alternatives and goals were discussed and mutually agreed upon: by patient   Hilary Hertz PT, SPT  Hilary Hertz 05/27/2022, 12:00 PM

## 2022-05-27 NOTE — Progress Notes (Signed)
Inpatient Rehabilitation Admission Medication Review by a Pharmacist  A complete drug regimen review was completed for this patient to identify any potential clinically significant medication issues.  High Risk Drug Classes Is patient taking? Indication by Medication  Antipsychotic No   Anticoagulant Yes Apixaban - afib, stroke ppx  Antibiotic No   Opioid No   Antiplatelet No ASA - CAD  Hypoglycemics/insulin Yes Insulin glargine, aspart - DM  Vasoactive Medication Yes Amiodarone - afib Terazosin - BPH  Chemotherapy No   Other Yes Amitriptyline - mood/migraine ppx Atorvastatin - HLD Cyclobenzaprine - PRN muscle spasms Escitalopram - mood Fluticasone - allergies Gabapentin - neuropathy/pain Levothyroxine - hypothyroid MVI - supplement Miralax, Senokot-S - constipation     Type of Medication Issue Identified Description of Issue Recommendation(s)  Drug Interaction(s) (clinically significant)     Duplicate Therapy     Allergy     No Medication Administration End Date     Incorrect Dose     Additional Drug Therapy Needed     Significant med changes from prior encounter (inform family/care partners about these prior to discharge). Semaglutide, furosemide, nitroglycerin, metoprolol, ketorolac PO, diclofenac, omeprazole not resumed Communicate medication changes with patient/family at discharge  Other       Clinically significant medication issues were identified that warrant physician communication and completion of prescribed/recommended actions by midnight of the next day:  No   Pharmacist comments: n/a   Time spent performing this drug regimen review (minutes): 20   Thank you for allowing pharmacy to be a part of this patient's care.  Thelma Barge, PharmD Clinical Pharmacist

## 2022-05-28 DIAGNOSIS — I63511 Cerebral infarction due to unspecified occlusion or stenosis of right middle cerebral artery: Secondary | ICD-10-CM | POA: Diagnosis not present

## 2022-05-28 LAB — CULTURE, BLOOD (SINGLE)
Culture: NO GROWTH
Special Requests: ADEQUATE

## 2022-05-28 LAB — GLUCOSE, CAPILLARY
Glucose-Capillary: 139 mg/dL — ABNORMAL HIGH (ref 70–99)
Glucose-Capillary: 203 mg/dL — ABNORMAL HIGH (ref 70–99)
Glucose-Capillary: 125 mg/dL — ABNORMAL HIGH (ref 70–99)
Glucose-Capillary: 142 mg/dL — ABNORMAL HIGH (ref 70–99)

## 2022-05-28 NOTE — Progress Notes (Signed)
Occupational Therapy Session Note  Patient Details  Name: Grant [name] MRN: [id number] Date of Birth: 08-01-1945  Today's Date: 05/28/2022 OT Individual Time: 1045-1200 OT Individual Time Calculation (min): 75 min    Short Term Goals: Week 1:  OT Short Term Goal 1 (Week 1): Pt will demonstrate improved LUE coordination to don shirt with min A. OT Short Term Goal 2 (Week 1): Pt will demonstrate improved dynamic standing balance to pull pants over hips with mod A or less to support balance OT Short Term Goal 3 (Week 1): Pt will complete RW transfers to toilet with CGA or less. OT Short Term Goal 4 (Week 1): Pt will demonstrate improved L side awareness during bathing to wash UB with min cues to attend to L arm.  Skilled Therapeutic Interventions/Progress Updates:    Pt received in recliner stating he was very tired.  Offered him a shower but pt kept saying he was too tired.  NT arrived and said she had walked pt to the bathroom earlier and he did well.  Pt then decided he would shower.   Possibly it was due to fatigue, but overall pt requiring more A today with posterior postural lean more severe with ambulation, L neglect more pronounced, and having difficulty following directions.   Pt needed mod A to stand from recliner with poor attention to LUE.  He did use RW to bathroom to shower but it was not a safe strategy as he was leaning back and at the same time pushing walker too far forward, he was having great difficulty following directions to step to shower.  Once in shower, pt sat on BSC.  To doff pants, had pt stand with B hands on bar with max cues to keep feet under his knees as he kept sliding them forward.  Once standing, strong post lean. Sat for remaining of shower.    He is R handed but continually tried to use L hand, dropping the washcloth frequently.  Needed cues to wash entire L arm.  Pt not safe to stand in shower, so had him reach through [facility] to wash bottom.   Completed  stand pivot to wc with max cues and mod A to avoid having pt sit to quickly.  With dressing, his L neglect was causing him to need max A to find correct sleeve, orient clothing, complete process of pulling shirt on. Unable to feed either leg into pant leg.  He stood 2x in room using R hand on bed rail with improved balance and less of a lean.   Transferred to bed to rest. Applied TED hose. Pt began to doze off shortly after laying down. Bed alarm set and all needs met.   Therapy Documentation Precautions:  Precautions Precautions: Fall Precaution Comments: LT wrist pain with WB (fall 2 months ago on hand. No imaging) Restrictions Weight Bearing Restrictions: No   Pain: no c/o pain    ADL: ADL Eating: Set up Grooming: Setup Upper Body Bathing: Moderate cueing, Minimal assistance Where Assessed-Upper Body Bathing: Sitting at sink Lower Body Bathing: Moderate assistance, Moderate cueing Upper Body Dressing: Moderate cueing, Moderate assistance Lower Body Dressing: Maximal assistance Toileting: Maximal assistance Where Assessed-Toileting: Glass blower/designer: Moderate assistance Toilet Transfer Method: Stand pivot   Therapy/Group: Individual Therapy  Tashema Tiller 05/28/2022, 8:29 AM

## 2022-05-28 NOTE — IPOC Note (Signed)
Overall Plan of Care [facility]) Patient Details Name: Grant [name] MRN: [id number] DOB: 08/13/1945  Admitting Diagnosis: Right middle cerebral artery stroke [facility])  Hospital Problems: Principal Problem:   Right middle cerebral artery stroke [facility])     Functional Problem List: Nursing Pain, Bladder, Bowel, Safety, Edema, Sensory, Endurance, Skin Integrity, Medication Management, Motor  PT Balance, Endurance, Behavior, Motor, Safety, Sensory  OT Balance, Cognition, Endurance, Motor, Perception, Sensory, Safety  SLP Cognition, Linguistic, Safety  TR         Basic ADL's: OT Bathing, Dressing, Toileting     Advanced  ADL's: OT       Transfers: PT Bed Mobility, Bed to Chair, Car, [employer], Civil Service fast streamer, Research scientist (life sciences): PT Ambulation, Psychologist, prison and probation services, Stairs     Additional Impairments: OT Fuctional Use of Upper Extremity  SLP Social Cognition   Problem Solving, Memory, Awareness, Attention  TR      Anticipated Outcomes Item Anticipated Outcome  Self Feeding independent  Swallowing      Basic self-care  set up UB self care, CGA LB self care  Toileting  CGA   Bathroom Transfers supervision with RW to toilet, CGA to shower  Bowel/Bladder  incontinent x 2  Transfers  Supervison/ CGA  using LRAD  Locomotion  Supervison/ using LRAD  Communication     Cognition  min A  Pain  less than 4  Safety/Judgment  remain fall free while in rehab   Therapy Plan: PT Intensity: Minimum of 1-2 x/day ,45 to 90 minutes PT Frequency: 5 out of 7 days PT Duration Estimated Length of Stay: 12-14 days OT Intensity: Minimum of 1-2 x/day, 45 to 90 minutes OT Frequency: 5 out of 7 days OT Duration/Estimated Length of Stay: 14-16 days SLP Intensity: Minumum of 1-2 x/day, 30 to 90 minutes SLP Frequency: 3 to 5 out of 7 days SLP Duration/Estimated Length of Stay: 12-14 days   Team Interventions: Nursing Interventions Patient/Family Education, Disease  Management/Prevention, Discharge Planning, Bladder Management, Pain Management, Bowel Management, Medication Management  PT interventions Ambulation/gait training, Cognitive remediation/compensation, Discharge planning, DME/adaptive equipment instruction, Functional mobility training, Pain management, Psychosocial support, Therapeutic Activities, UE/LE Strength taining/ROM, Visual/perceptual remediation/compensation, Warden/ranger, Community reintegration, Disease management/prevention, Development worker, international aid stimulation, Neuromuscular re-education, Patient/family education, Museum/gallery curator, Therapeutic Exercise, UE/LE Coordination activities, Wheelchair propulsion/positioning  OT Interventions Warden/ranger, Cognitive remediation/compensation, DME/adaptive equipment instruction, Functional mobility training, Neuromuscular re-education, Patient/family education, Psychosocial support, Self Care/advanced ADL retraining, Therapeutic Activities, Therapeutic Exercise, UE/LE Coordination activities, Visual/perceptual remediation/compensation  SLP Interventions Cognitive remediation/compensation, Functional tasks, Internal/external aids, Patient/family education, Therapeutic Activities  TR Interventions    SW/CM Interventions Discharge Planning, Psychosocial Support, Patient/Family Education, Disease Management/Prevention   Barriers to Discharge MD  Medical stability  Nursing Decreased caregiver support, Home environment access/layout, Incontinence 1 level home 2 ste with rail on right  PT Neurogenic Bowel & Bladder, Behavior (Behavior: Aware of enviornment on left but nelgect with left side of body)    OT      SLP      SW Decreased caregiver support, Lack of/limited family support, Home environment access/layout     Team Discharge Planning: Destination: PT-Home ,OT- Home , SLP-Home Projected Follow-up: PT-Outpatient PT, OT-  Home health OT, SLP-24 hour  supervision/assistance, Home Health SLP, Outpatient SLP Projected Equipment Needs: PT-Cane, Rolling walker with 5" wheels, To be determined, Wheelchair cushion (measurements), Wheelchair (measurements), OT- Tub/shower seat, SLP-None recommended by SLP Equipment Details: PT- , OT-  Patient/family involved in discharge planning:  PT- Patient,  OT-Patient, SLP-Patient  MD ELOS: 10-14d Medical Rehab Prognosis:  Good Assessment: The patient has been admitted for CIR therapies with the diagnosis of RIght MCA infarct . The team will be addressing functional mobility, strength, stamina, balance, safety, adaptive techniques and equipment, self-care, bowel and bladder mgt, patient and caregiver education, diabetes and hypertension management . Goals have been set at sup. Anticipated discharge destination is home .    See Team Conference Notes for weekly updates to the plan of care

## 2022-05-28 NOTE — Progress Notes (Signed)
Physical Therapy Session Note  Patient Details  Name: Grant [name] MRN: [id number] Date of Birth: 13-Apr-1946  Today's Date: 05/28/2022 PT Individual Time: 0801-0845 PT Individual Time Calculation (min): 44 min   Short Term Goals: Week 1:  PT Short Term Goal 1 (Week 1): Pt wil perfrom bed/chair transfers w/ supervison assist using LRAD PT Short Term Goal 2 (Week 1): Pt ambulate 75 ft w/ supervsion assist using LRAD PT Short Term Goal 3 (Week 1): Pt will naviagte 4 6 inch steps w/ min A while using hand rails PT Short Term Goal 4 (Week 1): Pt wil perfrom  functional otucome measure to predict fall risk PT Short Term Goal 5 (Week 1): Pt will perfrom bed mobility at supervison level conistently  Skilled Therapeutic Interventions/Progress Updates: Pt presents semi-reclined in bed asleep and arouses slowly.  Pt initially requests PT to return later, but then states wants to sit EOB to eat after finding dentures in bed.  Pt transfers sup to sit w/ heavy min A this AM after cueing for bridging to EOB.  Pt sat EOB and Purewick removed (nursing aware of removal of tube and brief)  Dentures washed in sink.  Pt assisted w/ pants threaded over feet w/ LOB posterior and Left.  Pt transferred sit to stand w/ split hand positioning and manual placement and maintenance of L hand on RW.  Pt transfers w/ heavy min A and pt attempts to pull up jeans, but required max A from PT 2/2 LOB.  Pt transferred bed > w/c w/ mod A and verbal cues for foot clearance and sequencing.  Pt remained sitting in recliner w/ chair alarm on and breakfast tray in front to eat breakfast.     Therapy Documentation Precautions:  Precautions Precautions: Fall Precaution Comments: LT wrist pain with WB (fall 2 months ago on hand. No imaging) Restrictions Weight Bearing Restrictions: No General:   Vital Signs:   Pain: 0/10       Therapy/Group: Individual Therapy  Lucio Edward 05/28/2022, 8:49 AM

## 2022-05-28 NOTE — Progress Notes (Signed)
PROGRESS NOTE   Subjective/Complaints:  No problems overnite but does not know where his dentures are Oriented to place not time   ROS- neg CP, SOB, N/V/D Objective:   No results found. Recent Labs    05/25/22 0651 05/27/22 0620  WBC 6.8 5.8  HGB 11.8* 11.9*  HCT 36.1* 36.0*  PLT 113* 134*    Recent Labs    05/25/22 0651 05/27/22 0620  NA 136 139  K 3.5 3.8  CL 103 105  CO2 23 27  GLUCOSE 164* 151*  BUN 9 9  CREATININE 1.29* 1.45*  CALCIUM 8.7* 8.9     Intake/Output Summary (Last 24 hours) at 05/28/2022 0552 Last data filed at 05/28/2022 0451 Gross per 24 hour  Intake 814 ml  Output 2100 ml  Net -1286 ml         Physical Exam: Vital Signs Blood pressure 124/76, pulse 89, temperature 97.9 F (36.6 C), temperature source Oral, resp. rate 18, height 6\' 2"  (1.88 m), SpO2 93 %.   General: No acute distress Mood and affect are appropriate Heart: Regular rate and rhythm no rubs murmurs or extra sounds Lungs: Clear to auscultation, breathing unlabored, occ bibasilar wheezes Abdomen: Positive bowel sounds, soft nontender to palpation, nondistended Extremities: No clubbing, cyanosis, or edema Skin: No evidence of breakdown, no evidence of rash Neurologic: Cranial nerves II through XII intact, motor strength is 4/5 in bilateral deltoid, bicep, tricep, grip, hip flexor, knee extensors, ankle dorsiflexor and plantar flexor Sensory exam reduced  sensation to light touch  in bilateral upper and lower extremities Cerebellar exam normal finger to nose to finger as well as heel to shin in right  upper and lower extremities Dysmetria LUE and LLE Musculoskeletal: right hand intrinsic atrophy    Assessment/Plan: 1. Functional deficits which require 3+ hours per day of interdisciplinary therapy in a comprehensive inpatient rehab setting. Physiatrist is providing close team supervision and 24 hour management of  active medical problems listed below. Physiatrist and rehab team continue to assess barriers to discharge/monitor patient progress toward functional and medical goals  Care Tool:  Bathing    Body parts bathed by patient: Chest, Abdomen, Right upper leg, Left upper leg, Buttocks, Face, Left arm   Body parts bathed by helper: Front perineal area, Left lower leg, Right lower leg, Right arm     Bathing assist Assist Level: Moderate Assistance - Patient 50 - 74%     Upper Body Dressing/Undressing Upper body dressing   What is the patient wearing?: Pull over shirt    Upper body assist Assist Level: Moderate Assistance - Patient 50 - 74%    Lower Body Dressing/Undressing Lower body dressing      What is the patient wearing?: Underwear/pull up, Pants     Lower body assist Assist for lower body dressing: Maximal Assistance - Patient 25 - 49%     Toileting Toileting    Toileting assist Assist for toileting: Maximal Assistance - Patient 25 - 49%     Transfers Chair/bed transfer  Transfers assist     Chair/bed transfer assist level: Minimal Assistance - Patient > 75% (Required consistent verbal and tactile cuing)  Locomotion Ambulation   Ambulation assist      Assist level: Minimal Assistance - Patient > 75% Assistive device: Walker-rolling (required assitance w/ maintaing left hand on walker and RW navigation) Max distance: 78ft   Walk 10 feet activity   Assist     Assist level: Minimal Assistance - Patient > 75% (required assitance w/ maintaing left hand on walker and RW navigation) Assistive device: Walker-rolling   Walk 50 feet activity   Assist    Assist level: Minimal Assistance - Patient > 75% (required assitance w/ maintaing left hand on walker and RW navigation) Assistive device: Walker-rolling    Walk 150 feet activity   Assist Walk 150 feet activity did not occur: Safety/medical concerns         Walk 10 feet on uneven surface   activity   Assist Walk 10 feet on uneven surfaces activity did not occur: Safety/medical concerns         Wheelchair     Assist Is the patient using a wheelchair?: Yes Type of Wheelchair: Manual    Wheelchair assist level: Moderate Assistance - Patient 50 - 74% Max wheelchair distance: 44ft (Using RUE & RLE)    Wheelchair 50 feet with 2 turns activity    Assist        Assist Level: Moderate Assistance - Patient 50 - 74%   Wheelchair 150 feet activity     Assist      Assist Level: Maximal Assistance - Patient 25 - 49%   Blood pressure 124/76, pulse 89, temperature 97.9 F (36.6 C), temperature source Oral, resp. rate 18, height 6\' 2"  (1.88 m), SpO2 93 %.  Medical Problem List and Plan: 1. Functional deficits secondary to subacute right MCA ischemic infarction             -patient may  shower             -ELOS/Goals:  10 to 14 days , mod I to sup with PT/OT, sup with SLP             -Admit to CIR 2.  Antithrombotics: -DVT/anticoagulation:  Pharmaceutical: Eliquis             -antiplatelet therapy: Aspirin 81 mg daily 3. Pain Management: Neurontin 300 mg 3 times daily, Elavil 75mg  nightly, Flexeril 10 mg nightly as needed             -Consider increase dose neurontin  for neuropathic pain 4. Mood/Behavior/Sleep: Lexapro 10 mg nightly, Elavil 75 mg nightly.  Provide emotional support             -antipsychotic agents: N/A 5. Neuropsych/cognition: This patient is capable of making decisions on his own behalf. 6. Skin/Wound Care: Routine skin checks 7. Fluids/Electrolytes/Nutrition: Routine in and outs with follow-up chemistries 8.  Atrial fibrillation/AICD.  Continue amiodarone 200 mg daily as well as Eliquis.  Cardiac rate controlled 9.  Diabetes mellitus.  Hemoglobin A1c 9.9.  Semglee 32 units nightly.  SSI. Check blood sugars before meals and at bedtime CBG (last 3)  Recent Labs    05/27/22 1211 05/27/22 1711 05/27/22 2053  GLUCAP 144* 173* 163*    Am CBG in range , cont SSI  10.  Hyperlipidemia.  Lipitor 40mg  daily 11.  Chronic systolic congestive heart failure.  Monitor for any signs of fluid overload. On Furosemide 20mg  daily.  12.  BPH.  Hytrin 5 mg twice daily 13.  Tobacco use.  Provide counseling 14.  Hypothyroidism.  Synthroid 15.  CKD.  Baseline creatinine 1.43-1.94.  Follow-up chemistries 16. HTN. Long term goal normotensive. Home medications metoprolol and cozaar currently on hold. Monitor with activity. Vitals:   05/27/22 1941 05/28/22 0426  BP: (!) 141/103 124/76  Pulse: 81 89  Resp: 18 18  Temp: 98.5 F (36.9 C) 97.9 F (36.6 C)  SpO2: 96% 93%    17. Hypothyroidism. Continue synthroid    LOS: 2 days A FACE TO FACE EVALUATION WAS PERFORMED  Erick Colace 05/28/2022, 5:52 AM

## 2022-05-28 NOTE — Progress Notes (Signed)
Patient ID: Grant [name], male   DOB: Nov 04, 1945, 76 y.o.   MRN: [id number] Met with the patient to review current situation, rehab process, team conference and plan of care. Discussed secondary risks inclduing DM. (A1C 9./9), HF, CAD, HLD with medications and dietary modification recommendations. Patient noted awareness of information reviewed along with continuation of eliquis and ASA for A-fib. Continue to follow along to address educational needs to facilitate discharge home w spouse. Margarito Liner

## 2022-05-28 NOTE — Progress Notes (Signed)
Speech Language Pathology Daily Session Note  Patient Details  Name: Grant [name] MRN: [id number] Date of Birth: [birth year]  Today's Date: 05/28/2022 SLP Individual Time: 0900-1000 SLP Individual Time Calculation (min): 60 min  Short Term Goals: Week 1: SLP Short Term Goal 1 (Week 1): Patient will recall and demonstrate use of memory compensations to increase independence and safety at home with modA verbal cues. SLP Short Term Goal 2 (Week 1): Patient will demonstrate functional problem solving for basic and familiar tasks with Mod A verbal cues. SLP Short Term Goal 3 (Week 1): Pt will demonstrate awareness of errors during functional tasks with mod A verbal cues SLP Short Term Goal 4 (Week 1): Pt will demonstrate selective attention by completing cognitive tasks within a mildly distracting environment with min-to-mod A verbal redirection cues.  Skilled Therapeutic Interventions: Skilled ST treatment focused on cognitive-linguistic goals. Pt was consuming breakfast on arrival requiring min A fading to sup A for handling utensils and cup due to unsteadiness. Pt had spilled a little coffee on floor prior to arrival and required a more firm coffee cup due to flimsy cup quality with concern he would spill it further on himself. Pt was initially very alert and participative, however became increasingly drowsy following breakfast requiring mod A verbal redirection cues for sustained attention. SLP initiated "solving daily math problems" subtest with the ALFA. Pt required max-to-total A for problem solving and for working memory for recall of scenario. Unable to complete due to time constraints, however suspect this task may be too high level for pt at this time. Patient was left in recliner with alarm activated and immediate needs within reach at end of session. Continue per current plan of care.      Pain  None/denied  Therapy/Group: Individual Therapy  Tamala Ser 05/28/2022, 9:33  AM

## 2022-05-28 NOTE — Progress Notes (Signed)
Occupational Therapy Session Note  Patient Details  Name: [name] MRN: [id number] Date of Birth: 04/28/1946  Today's Date: 05/28/2022 OT Individual Time: 0814-4818 OT Individual Time Calculation (min): 13 min  and Today's Date: 05/28/2022 OT Missed Time: 17 Minutes Missed Time Reason: Patient fatigue   Short Term Goals: Week 1:  OT Short Term Goal 1 (Week 1): Pt will demonstrate improved LUE coordination to don shirt with min A. OT Short Term Goal 2 (Week 1): Pt will demonstrate improved dynamic standing balance to pull pants over hips with mod A or less to support balance OT Short Term Goal 3 (Week 1): Pt will complete RW transfers to toilet with CGA or less. OT Short Term Goal 4 (Week 1): Pt will demonstrate improved L side awareness during bathing to wash UB with min cues to attend to L arm.  Skilled Therapeutic Interventions/Progress Updates:  Pt received asleep in supine with wife present. Pt able to arouse initially but very difficult to understand. Pt does report fatigue from previous therapies. Education provided on importance of working with therapies, offered ADLs or bed level session with pt going in/out of sleep. Checked with nurse to see if pt received any meds to make him lethargic, she reported no. Pt missed 17 mins of session d/t fatigue/lethargy.   Therapy Documentation Precautions:  Precautions Precautions: Fall Precaution Comments: LT wrist pain with WB (fall 2 months ago on hand. No imaging) Restrictions Weight Bearing Restrictions: No General: General OT Amount of Missed Time: 17 Minutes  Pain: No pain    Therapy/Group: Individual Therapy  Barron Schmid 05/28/2022, 3:37 PM

## 2022-05-29 ENCOUNTER — Inpatient Hospital Stay (HOSPITAL_COMMUNITY): Payer: No Typology Code available for payment source

## 2022-05-29 DIAGNOSIS — I63511 Cerebral infarction due to unspecified occlusion or stenosis of right middle cerebral artery: Secondary | ICD-10-CM | POA: Diagnosis not present

## 2022-05-29 LAB — URINALYSIS, ROUTINE W REFLEX MICROSCOPIC
Bilirubin Urine: NEGATIVE
Glucose, UA: NEGATIVE mg/dL
Ketones, ur: NEGATIVE mg/dL
Nitrite: POSITIVE — AB
Protein, ur: NEGATIVE mg/dL
Specific Gravity, Urine: 1.012 (ref 1.005–1.030)
WBC, UA: >50 WBC/hpf — ABNORMAL HIGH (ref 0–5)
pH: 6 (ref 5.0–8.0)

## 2022-05-29 LAB — BASIC METABOLIC PANEL
Anion gap: 11 (ref 5–15)
Anion gap: 10 (ref 5–15)
BUN: 10 mg/dL (ref 8–23)
BUN: 11 mg/dL (ref 8–23)
CO2: 22 mmol/L (ref 22–32)
CO2: 24 mmol/L (ref 22–32)
Calcium: 8.7 mg/dL — ABNORMAL LOW (ref 8.9–10.3)
Calcium: 8.8 mg/dL — ABNORMAL LOW (ref 8.9–10.3)
Chloride: 104 mmol/L (ref 98–111)
Chloride: 102 mmol/L (ref 98–111)
Creatinine, Ser: 1.46 mg/dL — ABNORMAL HIGH (ref 0.61–1.24)
Creatinine, Ser: 1.49 mg/dL — ABNORMAL HIGH (ref 0.61–1.24)
GFR, Estimated: 49 mL/min — ABNORMAL LOW (ref >60)
GFR, Estimated: 48 mL/min — ABNORMAL LOW (ref >60)
Glucose, Bld: 143 mg/dL — ABNORMAL HIGH (ref 70–99)
Glucose, Bld: 178 mg/dL — ABNORMAL HIGH (ref 70–99)
Potassium: 3.9 mmol/L (ref 3.5–5.1)
Potassium: 4 mmol/L (ref 3.5–5.1)
Sodium: 137 mmol/L (ref 135–145)
Sodium: 136 mmol/L (ref 135–145)

## 2022-05-29 LAB — CBC WITH DIFFERENTIAL/PLATELET
Abs Immature Granulocytes: 0.16 10*3/uL — ABNORMAL HIGH (ref 0.00–0.07)
Basophils Absolute: 0 10*3/uL (ref 0.0–0.1)
Basophils Relative: 0 %
Eosinophils Absolute: 0 10*3/uL (ref 0.0–0.5)
Eosinophils Relative: 0 %
HCT: 39.4 % (ref 39.0–52.0)
Hemoglobin: 12.8 g/dL — ABNORMAL LOW (ref 13.0–17.0)
Immature Granulocytes: 1 %
Lymphocytes Relative: 10 %
Lymphs Abs: 1.1 10*3/uL (ref 0.7–4.0)
MCH: 29.7 pg (ref 26.0–34.0)
MCHC: 32.5 g/dL (ref 30.0–36.0)
MCV: 91.4 fL (ref 80.0–100.0)
Monocytes Absolute: 1.1 10*3/uL — ABNORMAL HIGH (ref 0.1–1.0)
Monocytes Relative: 10 %
Neutro Abs: 8.8 10*3/uL — ABNORMAL HIGH (ref 1.7–7.7)
Neutrophils Relative %: 79 %
Platelets: 150 10*3/uL (ref 150–400)
RBC: 4.31 MIL/uL (ref 4.22–5.81)
RDW: 12.9 % (ref 11.5–15.5)
WBC: 11.3 10*3/uL — ABNORMAL HIGH (ref 4.0–10.5)
nRBC: 0 % (ref 0.0–0.2)

## 2022-05-29 LAB — GLUCOSE, CAPILLARY
Glucose-Capillary: 125 mg/dL — ABNORMAL HIGH (ref 70–99)
Glucose-Capillary: 151 mg/dL — ABNORMAL HIGH (ref 70–99)
Glucose-Capillary: 167 mg/dL — ABNORMAL HIGH (ref 70–99)

## 2022-05-29 MED ORDER — SODIUM CHLORIDE 0.45 % IV BOLUS
250.0000 mL | Freq: Once | INTRAVENOUS | Status: AC
  Administered 2022-05-29: 250 mL via INTRAVENOUS

## 2022-05-29 MED ORDER — CEPHALEXIN 250 MG PO CAPS
500.0000 mg | ORAL_CAPSULE | Freq: Three times a day (TID) | ORAL | Status: AC
  Administered 2022-05-29 – 2022-06-03 (×15): 500 mg via ORAL
  Filled 2022-05-29 (×15): qty 2

## 2022-05-29 MED ORDER — GABAPENTIN 300 MG PO CAPS
300.0000 mg | ORAL_CAPSULE | Freq: Every day | ORAL | Status: DC
  Administered 2022-05-30 – 2022-06-01 (×3): 300 mg via ORAL
  Filled 2022-05-29 (×3): qty 1

## 2022-05-29 MED ORDER — TERAZOSIN HCL 1 MG PO CAPS
2.0000 mg | ORAL_CAPSULE | Freq: Two times a day (BID) | ORAL | Status: DC
  Administered 2022-05-29 – 2022-06-01 (×7): 2 mg via ORAL
  Filled 2022-05-29 (×8): qty 2

## 2022-05-29 NOTE — Progress Notes (Signed)
Occupational Therapy Session Note  Patient Details  Name: Grant [name] MRN: [id number] Date of Birth: 1945/10/10  Today's Date: 05/29/2022 OT Individual Time: 4196-2229 OT Individual Time Calculation (min): 53 min  and Today's Date: 05/29/2022 OT Missed Time: 22 Minutes Missed Time Reason: Patient fatigue   Short Term Goals: Week 1:  OT Short Term Goal 1 (Week 1): Pt will demonstrate improved LUE coordination to don shirt with min A. OT Short Term Goal 2 (Week 1): Pt will demonstrate improved dynamic standing balance to pull pants over hips with mod A or less to support balance OT Short Term Goal 3 (Week 1): Pt will complete RW transfers to toilet with CGA or less. OT Short Term Goal 4 (Week 1): Pt will demonstrate improved L side awareness during bathing to wash UB with min cues to attend to L arm.   Skilled Therapeutic Interventions/Progress Updates:    Pt greeted at time of session bed level sleeping but able to awaken with stimuli, no pain reported at this time. Spoke with nursing and PT staff regarding pt more lethargic today, IV started, etc. Pt had not eaten lunch at this time and OT lowering HOB to trendelenburg and physical assist for pt to bend knees and push up with feet to scoot up in bed with max cues for sequencing. Set up for lunch and pt performing self feeding with total A. Note OT making several environmental modifications to assist with self feeding for tray height, distance, holding items, etc but pt unable to scoop foot, bring to mouth, etc and dropping all utensils. Note pt also making movements in the air without spoon or utensil in hand. Eventually needing total A and hand held assist to bring food to mouth without dropping. Pt falling asleep during session several occasions and needing tactile cues to wake up. Nursing checking vitals at this time and pt with slightly high temp. OT ending session with AAROM for LUE for shoulder flexion/extension, minimal shoulder  circles, and elbow flexion/extension to promote functional movement patterns. Offered pt to get OOB or sit up at EOB but declining 2/2 fatigue. Spoke with nursing staff as well and put up sign outside room for feeding assist this date as pt was unable to bring any liquids or food items to mouth. Alarm on call bell in reach. Missed 22 minutes 2/2 fatigue and not feeling well.  Therapy Documentation Precautions:  Precautions Precautions: Fall Precaution Comments: LT wrist pain with WB (fall 2 months ago on hand. No imaging) Restrictions Weight Bearing Restrictions: No     Therapy/Group: Individual Therapy  Erasmo Score 05/29/2022, 7:56 AM

## 2022-05-29 NOTE — Progress Notes (Addendum)
SLP Cancellation Note  Patient Details Name: Garvin Ellena MRN: [id number] DOB: [birth year]   Cancelled treatment:        Pt sleeping and unable to wake up enough for speech therapy session. Pt responded yes when asked if he wanted water. St repositioned patient but he was not alert enough to give sips of water due to falling back asleep.                                                                                                 Amil Amen A Broden Holt 05/29/2022, 12:36 PM

## 2022-05-29 NOTE — Progress Notes (Addendum)
PROGRESS NOTE   Subjective/Complaints:  Lowered to floor by PT, patient denies pains , fluid intake has been poor , per PT  , pt leaning more to Right side  ROS- neg CP, SOB, N/V/D Objective:   No results found. Recent Labs    05/27/22 0620  WBC 5.8  HGB 11.9*  HCT 36.0*  PLT 134*    Recent Labs    05/27/22 0620  NA 139  K 3.8  CL 105  CO2 27  GLUCOSE 151*  BUN 9  CREATININE 1.45*  CALCIUM 8.9     Intake/Output Summary (Last 24 hours) at 05/29/2022 0843 Last data filed at 05/28/2022 1800 Gross per 24 hour  Intake 697 ml  Output 200 ml  Net 497 ml         Physical Exam: Vital Signs Blood pressure 104/81, pulse (!) 107, temperature 99.8 F (37.7 C), temperature source Oral, resp. rate 18, height 6\' 2"  (1.88 m), SpO2 93 %.   General: No acute distress Mood and affect are appropriate Heart: Regular rate and rhythm no rubs murmurs or extra sounds Lungs: Clear to auscultation, breathing unlabored, occ bibasilar wheezes Abdomen: Positive bowel sounds, soft nontender to palpation, nondistended Extremities: No clubbing, cyanosis, or edema Skin: No evidence of breakdown, no evidence of rash Neurologic: Cranial nerves II through XII intact, motor strength is 4/5 in bilateral deltoid, bicep, tricep, grip, hip flexor, knee extensors, ankle dorsiflexor and plantar flexor Sensory exam reduced  sensation to light touch  in bilateral upper and lower extremities Cerebellar exam normal finger to nose to finger as well as heel to shin in right  upper and lower extremities Dysmetria LUE and LLE Musculoskeletal: right hand intrinsic atrophy    Assessment/Plan: 1. Functional deficits which require 3+ hours per day of interdisciplinary therapy in a comprehensive inpatient rehab setting. Physiatrist is providing close team supervision and 24 hour management of active medical problems listed below. Physiatrist and rehab  team continue to assess barriers to discharge/monitor patient progress toward functional and medical goals  Care Tool:  Bathing    Body parts bathed by patient: Chest, Abdomen, Right upper leg, Left upper leg, Buttocks, Face, Left arm   Body parts bathed by helper: Front perineal area, Left lower leg, Right lower leg, Right arm     Bathing assist Assist Level: Moderate Assistance - Patient 50 - 74%     Upper Body Dressing/Undressing Upper body dressing   What is the patient wearing?: Pull over shirt    Upper body assist Assist Level: Moderate Assistance - Patient 50 - 74%    Lower Body Dressing/Undressing Lower body dressing      What is the patient wearing?: Underwear/pull up, Pants     Lower body assist Assist for lower body dressing: Maximal Assistance - Patient 25 - 49%     Toileting Toileting    Toileting assist Assist for toileting: Maximal Assistance - Patient 25 - 49%     Transfers Chair/bed transfer  Transfers assist     Chair/bed transfer assist level: Moderate Assistance - Patient 50 - 74%     Locomotion Ambulation   Ambulation assist      Assist  level: Minimal Assistance - Patient > 75% Assistive device: Walker-rolling (required assitance w/ maintaing left hand on walker and RW navigation) Max distance: 56ft   Walk 10 feet activity   Assist     Assist level: Minimal Assistance - Patient > 75% (required assitance w/ maintaing left hand on walker and RW navigation) Assistive device: Walker-rolling   Walk 50 feet activity   Assist    Assist level: Minimal Assistance - Patient > 75% (required assitance w/ maintaing left hand on walker and RW navigation) Assistive device: Walker-rolling    Walk 150 feet activity   Assist Walk 150 feet activity did not occur: Safety/medical concerns         Walk 10 feet on uneven surface  activity   Assist Walk 10 feet on uneven surfaces activity did not occur: Safety/medical concerns          Wheelchair     Assist Is the patient using a wheelchair?: Yes Type of Wheelchair: Manual    Wheelchair assist level: Moderate Assistance - Patient 50 - 74% Max wheelchair distance: 43ft (Using RUE & RLE)    Wheelchair 50 feet with 2 turns activity    Assist        Assist Level: Moderate Assistance - Patient 50 - 74%   Wheelchair 150 feet activity     Assist      Assist Level: Maximal Assistance - Patient 25 - 49%   Blood pressure 104/81, pulse (!) 107, temperature 99.8 F (37.7 C), temperature source Oral, resp. rate 18, height 6\' 2"  (1.88 m), SpO2 93 %.  Medical Problem List and Plan: 1. Functional deficits secondary to subacute right MCA ischemic infarction             -patient may  shower             -ELOS/Goals:  10 to 14 days , mod I to sup with PT/OT, sup with SLP             -Admit to CIR 2.  Antithrombotics: -DVT/anticoagulation:  Pharmaceutical: Eliquis             -antiplatelet therapy: Aspirin 81 mg daily 3. Pain Management: Neurontin 300 mg 3 times daily, Elavil 75mg  nightly, Flexeril 10 mg nightly as needed             -Consider increase dose neurontin  for neuropathic pain 4. Mood/Behavior/Sleep: Lexapro 10 mg nightly, Elavil 75 mg nightly.  Provide emotional support             -antipsychotic agents: N/A 5. Neuropsych/cognition: This patient is capable of making decisions on his own behalf. 6. Skin/Wound Care: Routine skin checks 7. Fluids/Electrolytes/Nutrition: Routine in and outs with follow-up chemistries 8.  Atrial fibrillation/AICD.  Continue amiodarone 200 mg daily as well as Eliquis.  Cardiac rate controlled 9.  Diabetes mellitus.  Hemoglobin A1c 9.9.  Semglee 32 units nightly.  SSI. Check blood sugars before meals and at bedtime CBG (last 3)  Recent Labs    05/28/22 1620 05/28/22 2132 05/29/22 0616  GLUCAP 125* 142* 125*   Am CBG in range , cont SSI  10.  Hyperlipidemia.  Lipitor 40mg  daily 11.  Chronic systolic  congestive heart failure.  Monitor for any signs of fluid overload. On Furosemide 20mg  daily.  Fluid intake poor will check BMET  12.  BPH.  Hytrin 5 mg twice daily 13.  Tobacco use.  Provide counseling 14.  Hypothyroidism.  Synthroid 15.  CKD.  Baseline creatinine  1.43-1.94.  Follow-up chemistries 16. HTN. Long term goal normotensive. Home medications metoprolol and cozaar currently on hold. Monitor with activity. Vitals:   05/29/22 0509 05/29/22 0841  BP: 98/62 104/81  Pulse: 89 (!) 107  Resp: 18 18  Temp: 99.8 F (37.7 C)   SpO2: (!) 88% 93%  BPs on low side, no syncope, mild tachy but would not resume metoprolol Reduce terazosin to 2mg  BID BMET today  268ml .45 NS bolus- ECHO showed EF 60-65%- ok 17. Hypothyroidism. Continue synthroid    LOS: 3 days A FACE TO FACE EVALUATION WAS PERFORMED  Charlett Blake 05/29/2022, 8:43 AM

## 2022-05-29 NOTE — Progress Notes (Addendum)
Physical Therapy Session Note  Patient Details  Name: Grant [name] MRN: [id number] Date of Birth: November 30, 1945  Today's Date: 05/29/2022 PT Individual Time: 0805-0850 PT Individual Time Calculation (min): 45 min   Short Term Goals: Week 1:  PT Short Term Goal 1 (Week 1): Pt wil perfrom bed/chair transfers w/ supervison assist using LRAD PT Short Term Goal 2 (Week 1): Pt ambulate 75 ft w/ supervsion assist using LRAD PT Short Term Goal 3 (Week 1): Pt will naviagte 4 6 inch steps w/ min A while using hand rails PT Short Term Goal 4 (Week 1): Pt wil perfrom  functional otucome measure to predict fall risk PT Short Term Goal 5 (Week 1): Pt will perfrom bed mobility at supervison level conistently   Skilled Therapeutic Interventions/Progress Updates:   Pt received supine in bed and agreeable to PT. Supine>sit transfer with max assist with heavy pushing response to the R/posteriorly. Sitting balance EOB x 5 min with max assist progressing to min assist with increased time and ability to weight shift L and anteriorly. Sit<>stand with mod-max assist and BUE supported on RW. Of note, pt was able to perform stand pivot transfer to [place] with min assist with this PT and student 2 days prior with only mild posterior bias. On this day Pt reports unstediness and mild dizziness but denies lightheadedness. Performed additional sit>stand and attempt for stand pivot to WC. Pt noted to remove RUE from RW and reach for Wheelchair seat, requiring PT lower pt to floor for prevent fall. Pt requesting to attempt floor transfer, but PT denying due to continued dizziness sitting with back against EOB. Maxi move transfer to Bed with assist for RN staff. NT performed VS assessment supine in bed.  104/81 HR 107. Resp 18. 93% SpO2. Pt reports no pain throughout session. MD present to perform assessment at end of PT session.      Therapy Documentation Precautions:  Precautions Precautions: Fall Precaution Comments: LT  wrist pain with WB (fall 2 months ago on hand. No imaging) Restrictions Weight Bearing Restrictions: No General: PT Amount of Missed Time (min): 15 Minutes PT Missed Treatment Reason: Patient fatigue Vital Signs: Therapy Vitals Temp: 99.8 F (37.7 C) Temp Source: Oral Pulse Rate: (!) 107 Resp: 18 BP: 104/81 Patient Position (if appropriate): Lying Oxygen Therapy SpO2: 93 % O2 Device: Room Air Pain:  denies    Therapy/Group: Individual Therapy  [name] 05/29/2022, 9:07 AM

## 2022-05-29 NOTE — Progress Notes (Signed)
   05/29/22 0841  What Happened  Was fall witnessed? Yes  Who witnessed fall? Grier Rocher PT  Patients activity before fall to/from bed, chair, or stretcher  Point of contact buttocks  Was patient injured? No  Provider Notification  Provider Name/Title Claudette Laws  Date Provider Notified 05/29/22  Time Provider Notified [phone]  Method of Notification Face-to-face  Notification Reason Fall  Provider response At bedside  Date of Provider Response 05/29/22  Time of Provider Response 0840  Follow Up  Family notified Yes - comment  Time family notified 1500  Additional tests No  Simple treatment Other (comment) (rest)  Adult Fall Risk Assessment  Risk Factor Category (scoring not indicated) High fall risk per protocol (document High fall risk)  Patient Fall Risk Level High fall risk  Adult Fall Risk Interventions  Required Bundle Interventions *See Row Information* High fall risk - low, moderate, and high requirements implemented  Additional Interventions Use of appropriate toileting equipment (bedpan, BSC, etc.)  Screening for Fall Injury Risk (To be completed on HIGH fall risk patients) - Assessing Need for Floor Mats  Risk For Fall Injury- Criteria for Floor Mats Bleeding risk-anticoagulation (not prophylaxis)  Will Implement Floor Mats Yes  Vitals  BP 104/81  MAP (mmHg) 90  BP Location Right Arm  BP Method Automatic  Patient Position (if appropriate) Lying  Pulse Rate (!) 107  Pulse Rate Source Monitor  Resp 18  Oxygen Therapy  SpO2 93 %  O2 Device Room Air

## 2022-05-30 DIAGNOSIS — I63511 Cerebral infarction due to unspecified occlusion or stenosis of right middle cerebral artery: Secondary | ICD-10-CM | POA: Diagnosis not present

## 2022-05-30 LAB — GLUCOSE, CAPILLARY
Glucose-Capillary: 123 mg/dL — ABNORMAL HIGH (ref 70–99)
Glucose-Capillary: 139 mg/dL — ABNORMAL HIGH (ref 70–99)
Glucose-Capillary: 106 mg/dL — ABNORMAL HIGH (ref 70–99)
Glucose-Capillary: 135 mg/dL — ABNORMAL HIGH (ref 70–99)

## 2022-05-30 MED ORDER — LORATADINE 10 MG PO TABS
10.0000 mg | ORAL_TABLET | Freq: Every day | ORAL | Status: AC | PRN
  Administered 2022-05-30: 10 mg via ORAL
  Filled 2022-05-30: qty 1

## 2022-05-30 NOTE — Progress Notes (Addendum)
PROGRESS NOTE   Subjective/Complaints:  Reviewed labs discussed with nursing, had restful night temp down to normal, pt wakens to voice but lethargic this am   ROS- neg CP, SOB, N/V/D Objective:   CT HEAD WO CONTRAST ( )  Result Date: 05/29/2022 CLINICAL DATA:  Stroke, follow-up EXAM: CT HEAD WITHOUT CONTRAST TECHNIQUE: Contiguous axial images were obtained from the base of the skull through the vertex without intravenous contrast. RADIATION DOSE REDUCTION: This exam was performed according to the departmental dose-optimization program which includes automated exposure control, adjustment of the mA and/or kV according to patient size and/or use of iterative reconstruction technique. COMPARISON:  05/20/2022 CT head FINDINGS: Brain: No evidence of acute infarction, hemorrhage, mass, mass effect, or midline shift. No hydrocephalus or extra-axial fluid collection. Periventricular white matter changes, likely the sequela of chronic small vessel ischemic disease. Redemonstrated remote lacunar infarcts in the bilateral basal ganglia and right thalamus. Vascular: No hyperdense vessel. Atherosclerotic calcifications in the intracranial carotid and vertebral arteries. Skull: Normal. Negative for fracture or focal lesion. Sinuses/Orbits: Minimal mucosal thickening in the ethmoid air cells. The orbits are unremarkable. Other: The mastoid air cells are well aerated. IMPRESSION: No acute intracranial process. No evidence of acute or evolving infarct. Electronically Signed   By: Wiliam Ke M.D.   On: 05/29/2022 20:49   DG Chest 2 View  Result Date: 05/29/2022 CLINICAL DATA:  Altered level of consciousness, confusion EXAM: CHEST - 2 VIEW COMPARISON:  05/23/2022 FINDINGS: Frontal and lateral views of the chest demonstrate stable AICD. Postsurgical changes from median sternotomy, cardiac silhouette is stable. No acute airspace disease, effusion, or  pneumothorax. No acute bony abnormality. IMPRESSION: 1. No acute intrathoracic process. Electronically Signed   By: Sharlet Salina M.D.   On: 05/29/2022 20:02   Recent Labs    05/29/22 1809  WBC 11.3*  HGB 12.8*  HCT 39.4  PLT 150    Recent Labs    05/29/22 0925 05/29/22 1809  NA 137 136  K 3.9 4.0  CL 104 102  CO2 22 24  GLUCOSE 143* 178*  BUN 10 11  CREATININE 1.46* 1.49*  CALCIUM 8.7* 8.8*     Intake/Output Summary (Last 24 hours) at 05/30/2022 0745 Last data filed at 05/30/2022 9629 Gross per 24 hour  Intake 480 ml  Output 400 ml  Net 80 ml         Physical Exam: Vital Signs Blood pressure 103/64, pulse 91, temperature 98.7 F (37.1 C), temperature source Oral, resp. rate 17, height 6\' 2"  (1.88 m), SpO2 95 %.   General: No acute distress Mood and affect are appropriate Heart: Regular rate and rhythm no rubs murmurs or extra sounds Lungs: Clear to auscultation, breathing unlabored, occ bibasilar wheezes Abdomen: Positive bowel sounds, soft nontender to palpation, nondistended Extremities: No clubbing, cyanosis, or edema Skin: No evidence of breakdown, no evidence of rash Neurologic: Cranial nerves II through XII intact, motor strength is 4/5 in bilateral deltoid, bicep, tricep, grip, hip flexor, knee extensors, ankle dorsiflexor and plantar flexor Sensory exam reduced  sensation to light touch  in bilateral upper and lower extremities Cerebellar exam normal finger to nose to finger as well as  heel to shin in right  upper and lower extremities Dysmetria LUE and LLE Musculoskeletal: right hand intrinsic atrophy    Assessment/Plan: 1. Functional deficits which require 3+ hours per day of interdisciplinary therapy in a comprehensive inpatient rehab setting. Physiatrist is providing close team supervision and 24 hour management of active medical problems listed below. Physiatrist and rehab team continue to assess barriers to discharge/monitor patient progress  toward functional and medical goals  Care Tool:  Bathing    Body parts bathed by patient: Chest, Abdomen, Right upper leg, Left upper leg, Buttocks, Face, Left arm   Body parts bathed by helper: Front perineal area, Left lower leg, Right lower leg, Right arm     Bathing assist Assist Level: Moderate Assistance - Patient 50 - 74%     Upper Body Dressing/Undressing Upper body dressing   What is the patient wearing?: Pull over shirt    Upper body assist Assist Level: Moderate Assistance - Patient 50 - 74%    Lower Body Dressing/Undressing Lower body dressing      What is the patient wearing?: Underwear/pull up, Pants     Lower body assist Assist for lower body dressing: Maximal Assistance - Patient 25 - 49%     Toileting Toileting    Toileting assist Assist for toileting: Maximal Assistance - Patient 25 - 49%     Transfers Chair/bed transfer  Transfers assist     Chair/bed transfer assist level: Moderate Assistance - Patient 50 - 74%     Locomotion Ambulation   Ambulation assist      Assist level: Minimal Assistance - Patient > 75% Assistive device: Walker-rolling (required assitance w/ maintaing left hand on walker and RW navigation) Max distance: 26ft   Walk 10 feet activity   Assist     Assist level: Minimal Assistance - Patient > 75% (required assitance w/ maintaing left hand on walker and RW navigation) Assistive device: Walker-rolling   Walk 50 feet activity   Assist    Assist level: Minimal Assistance - Patient > 75% (required assitance w/ maintaing left hand on walker and RW navigation) Assistive device: Walker-rolling    Walk 150 feet activity   Assist Walk 150 feet activity did not occur: Safety/medical concerns         Walk 10 feet on uneven surface  activity   Assist Walk 10 feet on uneven surfaces activity did not occur: Safety/medical concerns         Wheelchair     Assist Is the patient using a wheelchair?:  Yes Type of Wheelchair: Manual    Wheelchair assist level: Moderate Assistance - Patient 50 - 74% Max wheelchair distance: 35ft (Using RUE & RLE)    Wheelchair 50 feet with 2 turns activity    Assist        Assist Level: Moderate Assistance - Patient 50 - 74%   Wheelchair 150 feet activity     Assist      Assist Level: Maximal Assistance - Patient 25 - 49%   Blood pressure 103/64, pulse 91, temperature 98.7 F (37.1 C), temperature source Oral, resp. rate 17, height 6\' 2"  (1.88 m), SpO2 95 %.  Medical Problem List and Plan: 1. Functional deficits secondary to subacute right MCA ischemic infarction             -patient may  shower             -ELOS/Goals:  10 to 14 days , mod I to sup with PT/OT, sup  with SLP           decline in functional status due to UTI, soft BP and possible sedating effect of gabapentin Gabapentin reduced to qhs , daughter would like this d/ced , expect improvement over the next 1-2 d with tx of UTI and med changes  Discussed lab results with daughter Lilyan Punt 2.  Antithrombotics: -DVT/anticoagulation:  Pharmaceutical: Eliquis             -antiplatelet therapy: Aspirin 81 mg daily 3. Pain Management: Neurontin 300 mg 3 times daily, Elavil 75mg  nightly, Flexeril 10 mg nightly as needed             -may have had some lethargy due to gabapentin , reduced to qhs on 12/9 4. Mood/Behavior/Sleep: Lexapro 10 mg nightly, Elavil 75 mg nightly.  Provide emotional support             -antipsychotic agents: N/A 5. Neuropsych/cognition: This patient is capable of making decisions on his own behalf. 6. Skin/Wound Care: Routine skin checks 7. Fluids/Electrolytes/Nutrition: Routine in and outs with follow-up chemistries 8.  Atrial fibrillation/AICD.  Continue amiodarone 200 mg daily as well as Eliquis.  Cardiac rate controlled 9.  Diabetes mellitus.  Hemoglobin A1c 9.9.  Semglee 32 units nightly.  SSI. Check blood sugars before meals and at bedtime CBG (last 3)   Recent Labs    05/29/22 1120 05/29/22 2116 05/30/22 0711  GLUCAP 151* 167* 123*   Am CBG in range , cont SSI  10.  Hyperlipidemia.  Lipitor 40mg  daily 11.  Chronic systolic congestive heart failure.  Monitor for any signs of fluid overload. On Furosemide 20mg  daily.  Fluid intake poor will check BMET  12.  BPH.  Hytrin 5 mg twice daily- reduced to 2mg  BID due to soft BP 13.  Tobacco use.  Provide counseling 14.  Hypothyroidism.  Synthroid 15.  CKD.  Baseline creatinine 1.43-1.94.  Follow-up chemistries 16. HTN. Long term goal normotensive. Home medications metoprolol and cozaar currently on hold. Monitor with activity. Vitals:   05/29/22 2035 05/30/22 0440  BP: 106/80 103/64  Pulse: (!) 106 91  Resp: 18 17  Temp: 98.9 F (37.2 C) 98.7 F (37.1 C)  SpO2: 96% 95%  BPs on low side, no syncope, mild tachy but would not resume metoprolol Reduce terazosin to 2mg  BID BMET today  242ml .45 NS bolus on 12/9- ECHO showed EF 60-65%- ok 17. Hypothyroidism. Continue synthroid   18.  Fever/AMS  improved + , UA + for UTI await micro, on Keflex 500mg  TID CT head no acute changes CXR clear  Has been mildlytachy prior to fever , cannot resume metoprolol due to soft BP , terazosin has been reduced , anticipate BP to increase and allow resumption of metoprolol  LOS: 4 days A FACE TO FACE EVALUATION WAS PERFORMED  Charlett Blake 05/30/2022, 7:45 AM

## 2022-05-31 DIAGNOSIS — N3000 Acute cystitis without hematuria: Secondary | ICD-10-CM

## 2022-05-31 DIAGNOSIS — E1122 Type 2 diabetes mellitus with diabetic chronic kidney disease: Secondary | ICD-10-CM

## 2022-05-31 DIAGNOSIS — Z794 Long term (current) use of insulin: Secondary | ICD-10-CM

## 2022-05-31 DIAGNOSIS — I5022 Chronic systolic (congestive) heart failure: Secondary | ICD-10-CM

## 2022-05-31 LAB — URINE CULTURE: Culture: >=100000 — AB

## 2022-05-31 LAB — GLUCOSE, CAPILLARY
Glucose-Capillary: 154 mg/dL — ABNORMAL HIGH (ref 70–99)
Glucose-Capillary: 105 mg/dL — ABNORMAL HIGH (ref 70–99)
Glucose-Capillary: 119 mg/dL — ABNORMAL HIGH (ref 70–99)
Glucose-Capillary: 119 mg/dL — ABNORMAL HIGH (ref 70–99)
Glucose-Capillary: 123 mg/dL — ABNORMAL HIGH (ref 70–99)

## 2022-05-31 NOTE — Progress Notes (Signed)
Physical Therapy Session Note  Patient Details  Name: Grant [name] MRN: [id number] Date of Birth: 1946/05/31  Today's Date: 05/31/2022 PT Individual Time: 7829-5621 PT Individual Time Calculation (min): 44 min   Short Term Goals: Week 1:  PT Short Term Goal 1 (Week 1): Pt wil perfrom bed/chair transfers w/ supervison assist using LRAD PT Short Term Goal 2 (Week 1): Pt ambulate 75 ft w/ supervsion assist using LRAD PT Short Term Goal 3 (Week 1): Pt will naviagte 4 6 inch steps w/ min A while using hand rails PT Short Term Goal 4 (Week 1): Pt wil perfrom  functional otucome measure to predict fall risk PT Short Term Goal 5 (Week 1): Pt will perfrom bed mobility at supervison level conistently   Skilled Therapeutic Interventions/Progress Updates:  Patient seated on toilet with two NT's assisting for safe STEDY use on entrance to room. Pt requires extensive vc from NT's for safe sequencing of sitting vs standing while in STEDY. Pt returned to w/c via STEDY and follows instructions for use and slow descent to seat with MinA.   Patient alert and agreeable to PT session.   Patient with no pain complaint at start of session.  Therapeutic Activity: Transfers: Pt performed sit<>stand while in STEDY requiring extensive and simple vc to execute desired movements and safety. Later in session, pt involved in blocked practice of sit<>stand in order to improve technique including forward lean, BLE and BUE positioning, full effort into upright posture. Overall requires Min/ ModA for sit <>stand and then up to Mod/ MaxA for balance in stance d/t posterior bias and desire to hold self in bodily flexion. Reaches out for objects to steady self with.   Neuromuscular Re-ed: NMR facilitated during session with focus on standing balance, following verbal cues/ commands, safety awareness, problem solving. Pt guided in sit <>stand to therapist being face-to-face and then to tray table with block to L side.  Pt initially requires ModA for transfer with heavy cues for technique. Pt also requires MaxA initially upon stance in order to overcome heavy posterior bias. Throughout sit<>stand blocked practice, pt improves to heavy MinA. Sometimes reaches forward to steady self at tray table.   Then guided in bean bag match to color discs in front of pt while standing. Pt guided in reach for bags to R side, name of color, match to disc on tray table using LUE then RUE. Pt correctly names and matches colors. Starts to demo fatigue and BLE flexion after 3 matches and moving to sit. VC to reach back with RUE to control descent to sit. Requires Min/ ModA for controlling descent. Second bout with pt focused to complete as quickly as possible despite vc to maintain upright posture.   NMR performed for improvements in motor control and coordination, balance, sequencing, judgement, and self confidence/ efficacy in performing all aspects of mobility at highest level of independence.   Patient seated in w/c at end of session with brakes locked, belt alarm set, and all needs within reach. Pt falls asleep quickly once setup with tray table in front of him. Unsure of positioning and ability to maintain upright posture throughout. NT and RN informed as to pt's positioning and potential need for return to bed as there is no family in room to provide supervision.    Therapy Documentation Precautions:  Precautions Precautions: Fall Precaution Comments: LT wrist pain with WB (fall 2 months ago on hand. No imaging) Restrictions Weight Bearing Restrictions: No General:   Vital Signs:  Pain:  No pain complaint this session.   Therapy/Group: Individual Therapy  Loel Dubonnet PT, DPT, CSRS 05/31/2022, 10:18 AM

## 2022-05-31 NOTE — Progress Notes (Incomplete)
Physical Therapy Session Note  Patient Details  Name: Grant [name] MRN: [id number] Date of Birth: 04/26/1946  Today's Date: 05/31/2022 PT Individual Time: 4098-1191 PT Individual Time Calculation (min): 44 min   Short Term Goals: Week 1:  PT Short Term Goal 1 (Week 1): Pt wil perfrom bed/chair transfers w/ supervison assist using LRAD PT Short Term Goal 2 (Week 1): Pt ambulate 75 ft w/ supervsion assist using LRAD PT Short Term Goal 3 (Week 1): Pt will naviagte 4 6 inch steps w/ min A while using hand rails PT Short Term Goal 4 (Week 1): Pt wil perfrom  functional otucome measure to predict fall risk PT Short Term Goal 5 (Week 1): Pt will perfrom bed mobility at supervison level conistently   Skilled Therapeutic Interventions/Progress Updates:  Patient seated on toilet with two NT's assisting for safe STEDY use on entrance to room. Pt requires extensive vc from NT's for safe sequencing of sitting vs standing while in STEDY. Pt returned to w/c via STEDY and follows instructions for use and slow descent to seat with MinA.   Patient alert and agreeable to PT session.   Patient with no pain complaint at start of session.  Therapeutic Activity: Transfers: Pt performed sit<>stand while in STEDY requiring extensive and simple vc to execute desired movements and safety. Later in session, pt involved in blocked practice of sit<>stand in order to improve technique including forward lean, BLE and BUE positioning, full effort into upright posture.   Neuromuscular Re-ed: NMR facilitated during session with focus on standing balance, following verbal cues/ commands, safety awareness, problem solving. Pt guided in sit <>stand to therapist being face-to-face and then to tray table with block to L side. Pt initially requires ModA for transfer with heavy cues for technique. Pt also requires MaxA initially upon stance in order to overcome heavy posterior bias. Throughout sit<>stand blocked practice,  pt improves to heavy MinA. Sometimes reaches forward to steady self.   Then guided in bean bag match to color discs in front of pt while standing. ***   NMR performed for improvements in motor control and coordination, balance, sequencing, judgement, and self confidence/ efficacy in performing all aspects of mobility at highest level of independence.   Patient seated in w/c at end of session with brakes locked, belt alarm set, and all needs within reach. Pt falls asleep quickly once setup with tray table in front of him. Unsure of positioning and ability to maintain upright posture throughout. NT and RN informed as to pt's positioning and potential need for return to bed as there is no family in room to provide supervision.    Therapy Documentation Precautions:  Precautions Precautions: Fall Precaution Comments: LT wrist pain with WB (fall 2 months ago on hand. No imaging) Restrictions Weight Bearing Restrictions: No General:   Vital Signs:   Pain:  No pain complaint this session.   Therapy/Group: Individual Therapy  Loel Dubonnet PT, DPT, CSRS 05/31/2022, 10:18 AM

## 2022-05-31 NOTE — Progress Notes (Signed)
SLP Cancellation Note  Patient Details Name: Grant [name] MRN: [id number] DOB: 1945/11/13  Cancelled treatment:  Pt was sleeping on arrival and required moderate-to-maximal cueing and extensive time to rouse. Pt was unable to maintain alertness in order to effectively participate in skilled ST intervention, and therefore missed 45 minutes of SLP treatment. Will attempt to see again as schedule allows. Nurse aware of drowsiness.                                                Tamala Ser 05/31/2022, 3:31 PM

## 2022-05-31 NOTE — Progress Notes (Signed)
PROGRESS NOTE   Subjective/Complaints:  Awake but still a little drowsy. Working with therapy. No new complaints this AM.   ROS- neg CP, SOB, N/V/D, abdominal pain Objective:   CT HEAD WO CONTRAST (5MM)  Result Date: 05/29/2022 CLINICAL DATA:  Stroke, follow-up EXAM: CT HEAD WITHOUT CONTRAST TECHNIQUE: Contiguous axial images were obtained from the base of the skull through the vertex without intravenous contrast. RADIATION DOSE REDUCTION: This exam was performed according to the departmental dose-optimization program which includes automated exposure control, adjustment of the mA and/or kV according to patient size and/or use of iterative reconstruction technique. COMPARISON:  05/20/2022 CT head FINDINGS: Brain: No evidence of acute infarction, hemorrhage, mass, mass effect, or midline shift. No hydrocephalus or extra-axial fluid collection. Periventricular white matter changes, likely the sequela of chronic small vessel ischemic disease. Redemonstrated remote lacunar infarcts in the bilateral basal ganglia and right thalamus. Vascular: No hyperdense vessel. Atherosclerotic calcifications in the intracranial carotid and vertebral arteries. Skull: Normal. Negative for fracture or focal lesion. Sinuses/Orbits: Minimal mucosal thickening in the ethmoid air cells. The orbits are unremarkable. Other: The mastoid air cells are well aerated. IMPRESSION: No acute intracranial process. No evidence of acute or evolving infarct. Electronically Signed   By: Merilyn Baba M.D.   On: 05/29/2022 20:49   DG Chest 2 View  Result Date: 05/29/2022 CLINICAL DATA:  Altered level of consciousness, confusion EXAM: CHEST - 2 VIEW COMPARISON:  05/23/2022 FINDINGS: Frontal and lateral views of the chest demonstrate stable AICD. Postsurgical changes from median sternotomy, cardiac silhouette is stable. No acute airspace disease, effusion, or pneumothorax. No acute bony  abnormality. IMPRESSION: 1. No acute intrathoracic process. Electronically Signed   By: Randa Ngo M.D.   On: 05/29/2022 20:02   Recent Labs    05/29/22 1809  WBC 11.3*  HGB 12.8*  HCT 39.4  PLT 150    Recent Labs    05/29/22 0925 05/29/22 1809  NA 137 136  K 3.9 4.0  CL 104 102  CO2 22 24  GLUCOSE 143* 178*  BUN 10 11  CREATININE 1.46* 1.49*  CALCIUM 8.7* 8.8*     Intake/Output Summary (Last 24 hours) at 05/31/2022 0936 Last data filed at 05/31/2022 0809 Gross per 24 hour  Intake 720 ml  Output 1350 ml  Net -630 ml         Physical Exam: Vital Signs Blood pressure 128/77, pulse 85, temperature 97.6 F (36.4 C), resp. rate 18, height 6\' 2"  (1.88 m), SpO2 98 %.   General: No acute distress Mood and affect are appropriate Heart: Regular rate and rhythm no rubs murmurs or extra sounds Lungs: Clear to auscultation, breathing unlabored, occ bibasilar wheezes Abdomen: Positive bowel sounds, soft nontender to palpation, nondistended Extremities: No clubbing, cyanosis, or edema Skin: No evidence of breakdown, no evidence of rash Neurologic: alert buy drowsy, Cranial nerves II through XII intact, motor strength is 4/5 in bilateral deltoid, bicep, tricep, grip, hip flexor, knee extensors, ankle dorsiflexor and plantar flexor Sensory exam reduced  sensation to light touch  in bilateral upper and lower extremities Cerebellar exam normal finger to nose to finger as well as heel to shin in  right  upper and lower extremities Dysmetria LUE and LLE Musculoskeletal: right hand intrinsic atrophy    Assessment/Plan: 1. Functional deficits which require 3+ hours per day of interdisciplinary therapy in a comprehensive inpatient rehab setting. Physiatrist is providing close team supervision and 24 hour management of active medical problems listed below. Physiatrist and rehab team continue to assess barriers to discharge/monitor patient progress toward functional and medical  goals  Care Tool:  Bathing    Body parts bathed by patient: Chest, Abdomen, Right upper leg, Left upper leg, Buttocks, Face, Left arm   Body parts bathed by helper: Front perineal area, Left lower leg, Right lower leg, Right arm     Bathing assist Assist Level: Moderate Assistance - Patient 50 - 74%     Upper Body Dressing/Undressing Upper body dressing   What is the patient wearing?: Pull over shirt    Upper body assist Assist Level: Moderate Assistance - Patient 50 - 74%    Lower Body Dressing/Undressing Lower body dressing      What is the patient wearing?: Underwear/pull up, Pants     Lower body assist Assist for lower body dressing: Maximal Assistance - Patient 25 - 49%     Toileting Toileting    Toileting assist Assist for toileting: Maximal Assistance - Patient 25 - 49%     Transfers Chair/bed transfer  Transfers assist     Chair/bed transfer assist level: Moderate Assistance - Patient 50 - 74%     Locomotion Ambulation   Ambulation assist      Assist level: Minimal Assistance - Patient > 75% Assistive device: Walker-rolling (required assitance w/ maintaing left hand on walker and RW navigation) Max distance: 14ft   Walk 10 feet activity   Assist     Assist level: Minimal Assistance - Patient > 75% (required assitance w/ maintaing left hand on walker and RW navigation) Assistive device: Walker-rolling   Walk 50 feet activity   Assist    Assist level: Minimal Assistance - Patient > 75% (required assitance w/ maintaing left hand on walker and RW navigation) Assistive device: Walker-rolling    Walk 150 feet activity   Assist Walk 150 feet activity did not occur: Safety/medical concerns         Walk 10 feet on uneven surface  activity   Assist Walk 10 feet on uneven surfaces activity did not occur: Safety/medical concerns         Wheelchair     Assist Is the patient using a wheelchair?: Yes Type of Wheelchair:  Manual    Wheelchair assist level: Moderate Assistance - Patient 50 - 74% Max wheelchair distance: 43ft (Using RUE & RLE)    Wheelchair 50 feet with 2 turns activity    Assist        Assist Level: Moderate Assistance - Patient 50 - 74%   Wheelchair 150 feet activity     Assist      Assist Level: Maximal Assistance - Patient 25 - 49%   Blood pressure 128/77, pulse 85, temperature 97.6 F (36.4 C), resp. rate 18, height 6\' 2"  (1.88 m), SpO2 98 %.  Medical Problem List and Plan: 1. Functional deficits secondary to subacute right MCA ischemic infarction             -patient may  shower             -ELOS/Goals:  10 to 14 days , mod I to sup with PT/OT, sup with SLP  decline in functional status due to UTI, soft BP and possible sedating effect of gabapentin Gabapentin reduced to qhs , daughter would like this d/ced , expect improvement over the next 1-2 d with tx of UTI and med changes  Discussed lab results with daughter Lilyan Punt 2.  Antithrombotics: -DVT/anticoagulation:  Pharmaceutical: Eliquis             -antiplatelet therapy: Aspirin 81 mg daily 3. Pain Management: Neurontin 300 mg 3 times daily, Elavil 75mg  nightly, Flexeril 10 mg nightly as needed             -may have had some lethargy due to gabapentin , reduced to qhs on 12/9 4. Mood/Behavior/Sleep: Lexapro 10 mg nightly, Elavil 75 mg nightly.  Provide emotional support             -antipsychotic agents: N/A 5. Neuropsych/cognition: This patient is capable of making decisions on his own behalf. 6. Skin/Wound Care: Routine skin checks 7. Fluids/Electrolytes/Nutrition: Routine in and outs with follow-up chemistries 8.  Atrial fibrillation/AICD.  Continue amiodarone 200 mg daily as well as Eliquis.  Cardiac rate controlled 9.  Diabetes mellitus.  Hemoglobin A1c 9.9.  Semglee 32 units nightly.  SSI. Check blood sugars before meals and at bedtime CBG (last 3)  Recent Labs    05/30/22 1629 05/30/22 2043  05/31/22 0554  GLUCAP 106* 135* 105*   Am CBG in range , cont SSI Well controlled  10.  Hyperlipidemia.  Lipitor 40mg  daily 11.  Chronic systolic congestive heart failure.  Monitor for any signs of fluid overload. On Furosemide 20mg  daily.  Fluid intake poor will check BMET, check weight ordered There were no vitals filed for this visit.   12.  BPH.  Hytrin 5 mg twice daily- reduced to 2mg  BID due to soft BP 13.  Tobacco use.  Provide counseling 14.  Hypothyroidism.  Synthroid 15.  CKD.  Baseline creatinine 1.43-1.94.  Follow-up chemistries -12/11 last BMP with Cr stable at 1.49 16. HTN. Long term goal normotensive. Home medications metoprolol and cozaar currently on hold. Monitor with activity. Vitals:   05/30/22 1932 05/31/22 0442  BP: 123/77 128/77  Pulse: 92 85  Resp: 18 18  Temp:  97.6 F (36.4 C)  SpO2: 95% 98%  BPs on low side, no syncope, mild tachy but would not resume metoprolol Reduce terazosin to 2mg  BID BMET today  215ml .45 NS bolus on 12/9- ECHO showed EF 60-65%- ok 12/11 BP stable 17. Hypothyroidism. Continue synthroid   18.  Fever/AMS  improved + , UA + for UTI await micro, on Keflex 500mg  TID CT head no acute changes CXR clear  Has been mildlytachy prior to fever , cannot resume metoprolol due to soft BP , terazosin has been reduced , anticipate BP to increase and allow resumption of metoprolol  12/11 continue keflex for pansensitive ecoli   LOS: 5 days A FACE TO FACE EVALUATION WAS PERFORMED  Jennye Boroughs 05/31/2022, 9:36 AM

## 2022-05-31 NOTE — Progress Notes (Signed)
Occupational Therapy Session Note  Patient Details  Name: Grant [name] MRN: [id number] Date of Birth: [birth year]  Today's Date: 05/31/2022 OT Individual Time: 5183-3582 OT Individual Time Calculation (min): 45 min  and Today's Date: 05/31/2022 OT Missed time: 15 min - fatigue     Short Term Goals: Week 1:  OT Short Term Goal 1 (Week 1): Pt will demonstrate improved LUE coordination to don shirt with min A. OT Short Term Goal 2 (Week 1): Pt will demonstrate improved dynamic standing balance to pull pants over hips with mod A or less to support balance OT Short Term Goal 3 (Week 1): Pt will complete RW transfers to toilet with CGA or less. OT Short Term Goal 4 (Week 1): Pt will demonstrate improved L side awareness during bathing to wash UB with min cues to attend to L arm.  Skilled Therapeutic Interventions/Progress Updates:   Pt received in bed, lethargic and slurred speech.  With time, pt became more alert and speech improved.  He was able to state he is was in a hospital in [place]. He also recalled his wife and daughter visiting.  As the session, progressed he became even more alert and able to participate well.      ADL Retraining: Pt did not have another shirt and stated he was given a good bath Saturday night and wanted to wait to bathe until his family would bring more clothing.  Pt was agreeable to trying to toilet.  Due to weakness from UTI and increased lean, opted to use a stedy lift this session.  Pt rolled onto R side but needed mod A to come to sit as he was having difficulty pushing up through R arm.  Once sitting, he was leaning R and even with max cues and guiding A was not able to reach L arm to support self on foot board.  With hip repositioning he was able to achieve static sit with supervision.  In sitting, his R sh flexion was only to 100 and L to sh flex to 45 which is less than on eval. He is also having more difficulty with coordination in R hand due to  tremors and L hand due to limited proprioception and sensory awareness.   He did follow directions extremely well with stedy and was able to rise to full stand with CGA.  Pt transported to toilet. Pt was continent and tried to void in sitting but unable to void.  He did have bowel on his bottom and tolerated standing and squatting in stedy to enable this therapist to cleanse him thoroughly.  Pt tolerated upright standing in stedy but then said " I am really giving out. I need to go back to bed".  Asked pt if he could tolerate sitting in wc to brush teeth but he felt he needed to return to bed.  Max A to manage clothing over hips due to impaired hand coordination.   Pt transferred back to bed.  Pt able to move to supine with no A.    Pt left in bed with all needs met, alarm on.   Therapy Documentation Precautions:  Precautions Precautions: Fall Precaution Comments: LT wrist pain with WB (fall 2 months ago on hand. No imaging) Restrictions Weight Bearing Restrictions: No    Vital Signs: Therapy Vitals Temp: 97.6 F (36.4 C) Pulse Rate: 85 Resp: 18 BP: 128/77 Patient Position (if appropriate): Lying Oxygen Therapy SpO2: 98 % O2 Device: Room Air Pain:  No c/o pain  Therapy/Group: Individual Therapy  Sholom Dulude 05/31/2022, 8:22 AM

## 2022-05-31 NOTE — Progress Notes (Signed)
Physical Therapy Session Note  Patient Details  Name: Christon Parada MRN: [id number] Date of Birth: 10/17/1945  Today's Date: 05/31/2022 PT Individual Time: 1348-1420 PT Individual Time Calculation (min): 32 min   Short Term Goals: Week 1:  PT Short Term Goal 1 (Week 1): Pt wil perfrom bed/chair transfers w/ supervison assist using LRAD PT Short Term Goal 2 (Week 1): Pt ambulate 75 ft w/ supervsion assist using LRAD PT Short Term Goal 3 (Week 1): Pt will naviagte 4 6 inch steps w/ min A while using hand rails PT Short Term Goal 4 (Week 1): Pt wil perfrom  functional otucome measure to predict fall risk PT Short Term Goal 5 (Week 1): Pt will perfrom bed mobility at supervison level conistently  Skilled Therapeutic Interventions/Progress Updates:  Patient supine in bed asleep on entrance to room. Patient requires time and effort from therapist to wake. After time, pt alert and agreeable to PT session.   Patient with no pain complaint at start of session.  Therapeutic Activity: Bed Mobility: Pt performed supine --> sit with ModA. Heavy vc/ tc required for ***. Transfers: Pt performed sit<>stand and stand pivot transfers throughout session with ***. Provided verbal cues for***.  Gait Training:  Pt ambulated *** ft using *** with ***. Demonstrated ***. Provided vc/ tc for ***.  Wheelchair Mobility:  Pt propelled wheelchair *** feet with ***. Provided vc for ***.  Neuromuscular Re-ed: NMR facilitated during session with focus on***. Pt guided in ***. NMR performed for improvements in motor control and coordination, balance, sequencing, judgement, and self confidence/ efficacy in performing all aspects of mobility at highest level of independence.   Therapeutic Exercise: Pt performed the following exercises with vc/ tc for proper technique. ***  Patient *** at end of session with brakes locked, *** alarm set, and all needs within reach.   Therapy Documentation Precautions:   Precautions Precautions: Fall Precaution Comments: LT wrist pain with WB (fall 2 months ago on hand. No imaging) Restrictions Weight Bearing Restrictions: No General:   Vital Signs: Therapy Vitals Temp: 98.1 F (36.7 C) Temp Source: Oral Pulse Rate: 96 Resp: 18 BP: 103/63 Patient Position (if appropriate): Sitting Oxygen Therapy SpO2: 94 % O2 Device: Room Air Pain:  No pain related this session.   Therapy/Group: Individual Therapy  Loel Dubonnet PT, DPT, CSRS 05/31/2022, 2:03 PM

## 2022-06-01 LAB — GLUCOSE, CAPILLARY
Glucose-Capillary: 88 mg/dL (ref 70–99)
Glucose-Capillary: 87 mg/dL (ref 70–99)
Glucose-Capillary: 109 mg/dL — ABNORMAL HIGH (ref 70–99)
Glucose-Capillary: 122 mg/dL — ABNORMAL HIGH (ref 70–99)

## 2022-06-01 NOTE — Progress Notes (Signed)
Physical Therapy Session Note  Patient Details  Name: Grant [name] MRN: [id number] Date of Birth: [birth year]  Today's Date: 06/01/2022 PT Individual Time: 1445-1540 PT Individual Time Calculation (min): 55 min   Short Term Goals: Week 1:  PT Short Term Goal 1 (Week 1): Pt wil perfrom bed/chair transfers w/ supervison assist using LRAD PT Short Term Goal 2 (Week 1): Pt ambulate 75 ft w/ supervsion assist using LRAD PT Short Term Goal 3 (Week 1): Pt will naviagte 4 6 inch steps w/ min A while using hand rails PT Short Term Goal 4 (Week 1): Pt wil perfrom  functional otucome measure to predict fall risk PT Short Term Goal 5 (Week 1): Pt will perfrom bed mobility at supervison level conistently  Skilled Therapeutic Interventions/Progress Updates:  Patient seated upright in w/c and watching TV on entrance to room. Patient alert and agreeable to PT session.   Patient with no pain complaint at start of session.  Therapeutic Activity/ NMR: Transfers: Toilet transfer performed using STEDY with pt able to pull at bar of STEDY and rise to stand with CGA/ MinA. Pt attempts clothing mgmt requiring Mod/ MaxA to maintain standing balance. VC to maintain hand hold and MaxA for clothing mgmt and pericare.   Pt performed sit<>stand transfers throughout session with improved initiation but requiring MinA to stand and Mod/ MaxA to acquire balance without AD and Mod A to acquire with RW placed far ahead of pt to encourage forward lean and improved balance.   Stand pivot transfers performed with conscious practice prior to performance with verbal instruction on required movement. With verbal instructions provided prior to performance and continuous vc throughout, pt with reduced  impulsivity.   Gait Training:  Pt ambulated 15 ft using RW with MinA overall and intermittent ModA for balance and proximity to walker. Provided vc/ tc for walker positioning, upright balance, maintaining position within  walker.   Neuromuscular Re-ed: NMR facilitated during session with focus on standing balance, coordination, motor control. Pt guided in standing marches with balance at RW, CGA. Progressed to toe touches to 4" step for balance/ motor control and proprioception.   NMR performed for improvements in motor control and coordination, balance, sequencing, judgement, and self confidence/ efficacy in performing all aspects of mobility at highest level of independence.   Pt performed stand pivot with RUE handhold to bedrail. Overall CGA/ MinA. Required to stand at bedside to pull up of pants with MaxA as well as to maintain standing balance.   Return to supine with supervision and vc for reaching neutral positioning.  Patient supine at end of session with brakes locked, bed alarm set, and all needs within reach.  Therapy Documentation Precautions:  Precautions Precautions: Fall Precaution Comments: LT wrist pain with WB (fall 2 months ago on hand. No imaging) Restrictions Weight Bearing Restrictions: No General:   Vital Signs:   Pain:  No pain related this session.   Therapy/Group: Individual Therapy  Loel Dubonnet PT, DPT, CSRS 06/01/2022, 5:28 PM

## 2022-06-01 NOTE — Progress Notes (Signed)
Occupational Therapy Session Note  Patient Details  Name: Grant [name] MRN: [id number] Date of Birth: 02-21-1946  Today's Date: 06/01/2022 OT Individual Time: 1123-1201 OT Individual Time Calculation (min): 38 min    Short Term Goals: Week 1:  OT Short Term Goal 1 (Week 1): Pt will demonstrate improved LUE coordination to don shirt with min A. OT Short Term Goal 2 (Week 1): Pt will demonstrate improved dynamic standing balance to pull pants over hips with mod A or less to support balance OT Short Term Goal 3 (Week 1): Pt will complete RW transfers to toilet with CGA or less. OT Short Term Goal 4 (Week 1): Pt will demonstrate improved L side awareness during bathing to wash UB with min cues to attend to L arm.  Skilled Therapeutic Interventions/Progress Updates:    Pt received in w/c alert, dressed and ready for therapy. Pt taken to ortho gym to focus on L side coordination and awareness. Pt started out on arm bike at no resistance. His L hand fequently fell off of bike handle and needed cues to grab handle again. Pt worked on bike for 10 min with cues to focus on maintaining grasp on handle.   He then donned gloves and then boxing gloves.  Pt worked on sitting upright using his core muscles and alternating jab punches to punch a physioball. With this activity he had excellent attention and use of his left arm.  He was able to punch forward and upward towards shoulder  height.  For focused grasp and release, he worked on reaching for cones with L hand. He did drop several cones but when he focused with his eyes his accuracy was much improved.  He then worked on holding the ball in both hands and lifting and lowering ball.  Pt returned to room and set up with tray table to prepare for lunch. Belt alarm on, call light in reach.    Therapy Documentation Precautions:  Precautions Precautions: Fall Precaution Comments: LT wrist pain with WB (fall 2 months ago on hand. No  imaging) Restrictions Weight Bearing Restrictions: No   Pain:   ADL: ADL Eating: Set up Grooming: Setup Upper Body Bathing: Moderate cueing, Minimal assistance Where Assessed-Upper Body Bathing: Sitting at sink Lower Body Bathing: Moderate assistance, Moderate cueing Upper Body Dressing: Moderate cueing, Moderate assistance Lower Body Dressing: Maximal assistance Toileting: Maximal assistance Where Assessed-Toileting: Teacher, adult education: Moderate assistance Toilet Transfer Method: Stand pivot  Therapy/Group: Individual Therapy  Grant [name] 06/01/2022, 9:00 AM

## 2022-06-01 NOTE — Progress Notes (Signed)
Occupational Therapy Session Note  Patient Details  Name: Grant [name] MRN: [id number] Date of Birth: 06-Oct-1945  Today's Date: 06/01/2022 OT Individual Time: 1318-1400 OT Individual Time Calculation (min): 42 min    Short Term Goals: Week 1:  OT Short Term Goal 1 (Week 1): Pt will demonstrate improved LUE coordination to don shirt with min A. OT Short Term Goal 2 (Week 1): Pt will demonstrate improved dynamic standing balance to pull pants over hips with mod A or less to support balance OT Short Term Goal 3 (Week 1): Pt will complete RW transfers to toilet with CGA or less. OT Short Term Goal 4 (Week 1): Pt will demonstrate improved L side awareness during bathing to wash UB with min cues to attend to L arm.  Skilled Therapeutic Interventions/Progress Updates:    Patient agreeable to participate in OT session. Reports 0/10 pain level.   Patient participated in skilled OT session focusing on Left side awareness and coordination. Therapist facilitated increased left hand motor coordination while requiring pt to reach across midline from left to right side using the LUE in order to improve bilateral coordination and increased left side awareness in order to utilize bilateral UE's during self care tasks.   - Seated functional reaching and coordination task completed with focus on increasing left side awareness, midline coordination and left side coordination using Squigz. 2lb wrist weight placed on left wrist for added intensity. Using LUE, pt reach across midline demonstrating functional grasp/release to retrieve each squigz with Min A for set-up in hand with VC. Pt then placed Squigz on vertical surface in front of body with Mod-Max difficulty. Pt then retrieved Squigz from vertical surface with LUE and crossed midline to the right placing each one in container on floor with focus on dynamic sitting balance added to task. Mod-max difficulty to complete.    Therapy  Documentation Precautions:  Precautions Precautions: Fall Precaution Comments: LT wrist pain with WB (fall 2 months ago on hand. No imaging) Restrictions Weight Bearing Restrictions: No  Therapy/Group: Individual Therapy  Limmie Patricia, OTR/L,CBIS  Supplemental OT - [facility] and [facility]  06/01/2022, 8:03 AM

## 2022-06-01 NOTE — Progress Notes (Signed)
Patient ID: Grant [name], male   DOB: 03/26/1946, 76 y.o.   MRN: [id number]  SW received notification about pt daughter requesting updates. Sw left detailed VM for [name] (daughter).

## 2022-06-01 NOTE — Progress Notes (Signed)
PROGRESS NOTE   Subjective/Complaints:  UCx ecoli sens to Keflex   Awake and alert this am , know his daughter Raynelle Fanning is going back home to [place] but does not know which city  ROS- neg CP, SOB, N/V/D Objective:   No results found. Recent Labs    05/29/22 1809  WBC 11.3*  HGB 12.8*  HCT 39.4  PLT 150    Recent Labs    05/29/22 0925 05/29/22 1809  NA 137 136  K 3.9 4.0  CL 104 102  CO2 22 24  GLUCOSE 143* 178*  BUN 10 11  CREATININE 1.46* 1.49*  CALCIUM 8.7* 8.8*     Intake/Output Summary (Last 24 hours) at 06/01/2022 0705 Last data filed at 06/01/2022 8182 Gross per 24 hour  Intake 1056 ml  Output 1025 ml  Net 31 ml         Physical Exam: Vital Signs Blood pressure 122/78, pulse 72, temperature 98 F (36.7 C), resp. rate 18, height 6\' 2"  (1.88 m), SpO2 96 %.   General: No acute distress Mood and affect are appropriate Heart: Regular rate and rhythm no rubs murmurs or extra sounds Lungs: Clear to auscultation, breathing unlabored, occ bibasilar wheezes Abdomen: Positive bowel sounds, soft nontender to palpation, nondistended Extremities: No clubbing, cyanosis, or edema Skin: No evidence of breakdown, no evidence of rash Neurologic: Cranial nerves II through XII intact, motor strength is 4/5 in bilateral deltoid, bicep, tricep, grip, hip flexor, knee extensors, ankle dorsiflexor and plantar flexor Sensory exam reduced  sensation to light touch  in right upper and left , has intact proprioception LLE Cerebellar exam normal finger to nose to finger as well as heel to shin in right  upper and lower extremities Dysmetria LUE and LLE Musculoskeletal: right hand intrinsic atrophy    Assessment/Plan: 1. Functional deficits which require 3+ hours per day of interdisciplinary therapy in a comprehensive inpatient rehab setting. Physiatrist is providing close team supervision and 24 hour management of  active medical problems listed below. Physiatrist and rehab team continue to assess barriers to discharge/monitor patient progress toward functional and medical goals  Care Tool:  Bathing    Body parts bathed by patient: Chest, Abdomen, Right upper leg, Left upper leg, Buttocks, Face, Left arm   Body parts bathed by helper: Front perineal area, Left lower leg, Right lower leg, Right arm     Bathing assist Assist Level: Moderate Assistance - Patient 50 - 74%     Upper Body Dressing/Undressing Upper body dressing   What is the patient wearing?: Pull over shirt    Upper body assist Assist Level: Moderate Assistance - Patient 50 - 74%    Lower Body Dressing/Undressing Lower body dressing      What is the patient wearing?: Underwear/pull up, Pants     Lower body assist Assist for lower body dressing: Maximal Assistance - Patient 25 - 49%     Toileting Toileting    Toileting assist Assist for toileting: Maximal Assistance - Patient 25 - 49%     Transfers Chair/bed transfer  Transfers assist     Chair/bed transfer assist level: Moderate Assistance - Patient 50 - 74%  Locomotion Ambulation   Ambulation assist      Assist level: Minimal Assistance - Patient > 75% Assistive device: Walker-rolling (required assitance w/ maintaing left hand on walker and RW navigation) Max distance: 67ft   Walk 10 feet activity   Assist     Assist level: Minimal Assistance - Patient > 75% (required assitance w/ maintaing left hand on walker and RW navigation) Assistive device: Walker-rolling   Walk 50 feet activity   Assist    Assist level: Minimal Assistance - Patient > 75% (required assitance w/ maintaing left hand on walker and RW navigation) Assistive device: Walker-rolling    Walk 150 feet activity   Assist Walk 150 feet activity did not occur: Safety/medical concerns         Walk 10 feet on uneven surface  activity   Assist Walk 10 feet on uneven  surfaces activity did not occur: Safety/medical concerns         Wheelchair     Assist Is the patient using a wheelchair?: Yes Type of Wheelchair: Manual    Wheelchair assist level: Moderate Assistance - Patient 50 - 74% Max wheelchair distance: 18ft (Using RUE & RLE)    Wheelchair 50 feet with 2 turns activity    Assist        Assist Level: Moderate Assistance - Patient 50 - 74%   Wheelchair 150 feet activity     Assist      Assist Level: Maximal Assistance - Patient 25 - 49%   Blood pressure 122/78, pulse 72, temperature 98 F (36.7 C), resp. rate 18, height 6\' 2"  (1.88 m), SpO2 96 %.  Medical Problem List and Plan: 1. Functional deficits secondary to subacute right MCA ischemic infarction             -patient may  shower             -ELOS/Goals:  10 to 14 days , mod I to sup with PT/OT, sup with SLP           decline in functional status due to UTI, soft BP and possible sedating effect of gabapentin Gabapentin reduced to qhs , daughter would like this d/ced , expect improvement over the next 1-2 d with tx of UTI and med changes   2.  Antithrombotics: -DVT/anticoagulation:  Pharmaceutical: Eliquis             -antiplatelet therapy: Aspirin 81 mg daily 3. Pain Management: Neurontin 300 mg 3 times daily, Elavil 75mg  nightly, Flexeril 10 mg nightly as needed             -may have had some lethargy due to gabapentin , reduced to qhs on 12/9 4. Mood/Behavior/Sleep: Lexapro 10 mg nightly, Elavil 75 mg nightly.  Provide emotional support             -antipsychotic agents: N/A 5. Neuropsych/cognition: This patient is capable of making decisions on his own behalf. 6. Skin/Wound Care: Routine skin checks 7. Fluids/Electrolytes/Nutrition: Routine in and outs with follow-up chemistries 8.  Atrial fibrillation/AICD.  Continue amiodarone 200 mg daily as well as Eliquis.  Cardiac rate controlled 9.  Diabetes mellitus.  Hemoglobin A1c 9.9.  Semglee 32 units nightly.   SSI. Check blood sugars before meals and at bedtime CBG (last 3)  Recent Labs    05/31/22 1634 05/31/22 2111 06/01/22 0606  GLUCAP 119* 123* 88   Am CBG in range , cont SSI  10.  Hyperlipidemia.  Lipitor 40mg  daily 11.  Chronic  systolic congestive heart failure.  Monitor for any signs of fluid overload. On Furosemide 20mg  daily.  Fluid intake poor will check BMET  12.  BPH.  Hytrin 5 mg twice daily- reduced to 2mg  BID due to soft BP 13.  Tobacco use.  Provide counseling 14.  Hypothyroidism.  Synthroid 15.  CKD.  Baseline creatinine 1.43-1.94.  Follow-up chemistries 16. HTN. Long term goal normotensive. Home medications metoprolol and cozaar currently on hold. Monitor with activity. Vitals:   05/31/22 2002 06/01/22 0451  BP: 112/71 122/78  Pulse: 81 72  Resp: 18 18  Temp: 97.9 F (36.6 C) 98 F (36.7 C)  SpO2: 96% 96%  BPs on low side, no syncope, mild tachy but would not resume metoprolol Reduce terazosin to 2mg  BID BMET today  216ml .45 NS bolus on 12/9- ECHO showed EF 60-65%- ok BPs improving 12/12 17. Hypothyroidism. Continue synthroid   18.  Fever/AMS  improved + , UA + for e coli >100K sens to keflex , cont 500mg  TID x 7d total  CT head no acute changes CXR clear  Has been mildlytachy prior to fever , cannot resume metoprolol due to soft BP , terazosin has been reduced , anticipate BP to increase and allow resumption of metoprolol  LOS: 6 days A FACE TO FACE EVALUATION WAS PERFORMED  Charlett Blake 06/01/2022, 7:05 AM

## 2022-06-01 NOTE — Progress Notes (Signed)
Physical Therapy Session Note  Patient Details  Name: Mauricio Dahlen MRN: [id number] Date of Birth: 1946-04-30  Today's Date: 06/01/2022 PT Individual Time: 1025-1107 PT Individual Time Calculation (min): 42 min   Short Term Goals: Week 1:  PT Short Term Goal 1 (Week 1): Pt wil perfrom bed/chair transfers w/ supervison assist using LRAD PT Short Term Goal 2 (Week 1): Pt ambulate 75 ft w/ supervsion assist using LRAD PT Short Term Goal 3 (Week 1): Pt will naviagte 4 6 inch steps w/ min A while using hand rails PT Short Term Goal 4 (Week 1): Pt wil perfrom  functional otucome measure to predict fall risk PT Short Term Goal 5 (Week 1): Pt will perfrom bed mobility at supervison level conistently  Skilled Therapeutic Interventions/Progress Updates:    Pt received supine in bed awake and agreeable to therapy session. Pt noted to be incontinent of bladder. Rolling R/L in bed using bedrails with supervision during dependent LB clothing management for cleanliness - pt able to perform anterior peri-care set-up assist. Donned B LE knee high TED hose total assist, threaded on pants total assist then pt able to perform bridge to pull pants up over hips with impaired L hand sensation and coordination in order to grasp onto pants requiring mod assist to complete task.   Supine>sitting R EOB, HOB flat but using bedrail, with step-by-step cuing for sequencing logroll technique to increase pt independence, with heavy min assist to bring trunk upright. Sitting EOB with CGA/close supervision and pt sitting in midline.  L squat pivot transfer EOB>w/c with light mod assist for lifting/pivoting hips and step-by-step cuing for sequencing hand placement and head/hips relationship.   Sitting in w/c at sink perform hand hygiene and washed face with min assist - pt continues to have impaired coordination and sensation in L hand requiring increased time to manipulate items and grasp them correctly.    Transported  to/from gym in w/c for time management and energy conservation.  Sit>stand w/c>RW with light mod assist of 1 and +2 providing close supervision for safety - pt having excessive trunk hyperextension while rising with posterior lean requiring heavy min assist to maintain balance (gradually improving). Pt reports  dizziness/lightheadedness and feeling faint while standing therefore returned to sitting and assessed vitals: BP 102/70 (MAP 81), HR 64bpm. Pt reports symptoms taking >71minutes to resolve while sitting, but reports feeling better.  Transported back to his room and pt agreeable to remain sitting in w/c until next therapy session - left with needs in reach and seat belt alarm on.  Therapy Documentation Precautions:  Precautions Precautions: Fall Precaution Comments: LT wrist pain with WB (fall 2 months ago on hand. No imaging) Restrictions Weight Bearing Restrictions: No   Pain:  No reports of pain throughout session.    Therapy/Group: Individual Therapy  Ginny Forth , PT, DPT, NCS, CSRS 06/01/2022, 8:02 AM

## 2022-06-01 NOTE — Progress Notes (Signed)
Speech Language Pathology Daily Session Note  Patient Details  Name: Grant [name] MRN: [id number] Date of Birth: 04-15-1946  Today's Date: 06/01/2022 SLP Individual Time: 0900-1000 SLP Individual Time Calculation (min): 60 min  Short Term Goals: Week 1: SLP Short Term Goal 1 (Week 1): Patient will recall and demonstrate use of memory compensations to increase independence and safety at home with modA verbal cues. SLP Short Term Goal 2 (Week 1): Patient will demonstrate functional problem solving for basic and familiar tasks with Mod A verbal cues. SLP Short Term Goal 3 (Week 1): Pt will demonstrate awareness of errors during functional tasks with mod A verbal cues SLP Short Term Goal 4 (Week 1): Pt will demonstrate selective attention by completing cognitive tasks within a mildly distracting environment with min-to-mod A verbal redirection cues.  Skilled Therapeutic Interventions: Skilled ST treatment focused on cognitive goals. Pt was received semi reclined in bed and awake on arrival. Considering pt was very sleepy during previous session and and therefore unable to effectively participate, SLP made comment "you're awake!" Pt laughed and responded "yeah but not for long" and quickly fell back asleep. Pt's alertness waxed and waned throughout session requiring mod-to-max A redirection cues to persist to tasks. SLP facilitated the session with a novel card game targeting memory with use of memory compensations, sustained attention, and problem solving, and reasoning skills. Pt completed task with max fading to mod A verbal cues for attention to task and problem solving skills. Pt utilized memory compensations via external aid to recall task instructions with mod A verbal cues. SLP then facilitated a verbal reasoning, working memory, and sustained attention task by generating words within specific categories with initially total A for working memory, progressing to mod A with use of external  visual aid. Patient was left in bed with alarm activated and immediate needs within reach at end of session. Continue per current plan of care.      Pain  None/denied  Therapy/Group: Individual Therapy  Daved Mcfann T Deshana Rominger 06/01/2022, 9:10 AM

## 2022-06-02 LAB — GLUCOSE, CAPILLARY
Glucose-Capillary: 111 mg/dL — ABNORMAL HIGH (ref 70–99)
Glucose-Capillary: 134 mg/dL — ABNORMAL HIGH (ref 70–99)
Glucose-Capillary: 113 mg/dL — ABNORMAL HIGH (ref 70–99)
Glucose-Capillary: 126 mg/dL — ABNORMAL HIGH (ref 70–99)

## 2022-06-02 MED ORDER — TERAZOSIN HCL 1 MG PO CAPS
1.0000 mg | ORAL_CAPSULE | Freq: Two times a day (BID) | ORAL | Status: DC
  Administered 2022-06-02 – 2022-06-11 (×18): 1 mg via ORAL
  Filled 2022-06-02 (×20): qty 1

## 2022-06-02 MED ORDER — INSULIN GLARGINE-YFGN 100 UNIT/ML ~~LOC~~ SOLN
29.0000 [IU] | Freq: Every day | SUBCUTANEOUS | Status: DC
  Administered 2022-06-02 – 2022-06-04 (×3): 29 [IU] via SUBCUTANEOUS
  Filled 2022-06-02 (×4): qty 0.29

## 2022-06-02 NOTE — Progress Notes (Signed)
Physical Therapy Session Note  Patient Details  Name: Grant [name] MRN: [id number] Date of Birth: 02-Oct-1945  Today's Date: 06/02/2022 PT Individual Time: 0850-1000 PT Individual Time Calculation (min): 70 min   Short Term Goals: Week 1:  PT Short Term Goal 1 (Week 1): Pt wil perfrom bed/chair transfers w/ supervison assist using LRAD PT Short Term Goal 2 (Week 1): Pt ambulate 75 ft w/ supervsion assist using LRAD PT Short Term Goal 3 (Week 1): Pt will naviagte 4 6 inch steps w/ min A while using hand rails PT Short Term Goal 4 (Week 1): Pt wil perfrom  functional otucome measure to predict fall risk PT Short Term Goal 5 (Week 1): Pt will perfrom bed mobility at supervison level conistently Week 2:     Skilled Therapeutic Interventions/Progress Updates:   Pt received supine in bed and agreeable to PT. Supine>sit transfer with max assist and cues max cues for sequencing as well as orientation to vertical once sitting EOB to prevent posterior bias  Sitting balance EOB with max assist progressing to min assist to prevent posterior LOB for medication administration from RN   Stedy transfer to [facility] with mod assist with moderate posterior bias.   Eating in [facility] with Emphasis on visual scanning to the L by set up from PT to locate food and drink with min cues to attention to the R.   Sit<>stand in stedy x 5 throughout session with mod assist progressing to min assist. Attempted toiletting sitting on BSC in bathroom, unable to voild bowel or bladder.   Sit<>stand in parallel bars with mod assist progressing to min assist. Gait training with Parallel bars 2 x 49ft with min assist.  Gait training with RW and +2 for WC follow for safety x 41ft. Min-mod assist for management of AD and attention to the LUE due ot L side body inattention    Pt returned to room and performed stand pivot transfer to bed with min-mod assist for safety and AD management. Sit>supine completed with supervision assist,  and left supine in bed with call bell in reach and all needs met.           Therapy Documentation Precautions:  Precautions Precautions: Fall Precaution Comments: LT wrist pain with WB (fall 2 months ago on hand. No imaging) Restrictions Weight Bearing Restrictions: No    Pain:  denies    Therapy/Group: Individual Therapy  [name] 06/02/2022, 10:07 AM

## 2022-06-02 NOTE — Progress Notes (Signed)
Occupational Therapy Session Note  Patient Details  Name: Grant [name] MRN: [id number] Date of Birth: 1946/01/24  Today's Date: 06/02/2022 OT Individual Time: 2481-8590 OT Individual Time Calculation (min): 31 min    Short Term Goals: Week 1:  OT Short Term Goal 1 (Week 1): Pt will demonstrate improved LUE coordination to don shirt with min A. OT Short Term Goal 2 (Week 1): Pt will demonstrate improved dynamic standing balance to pull pants over hips with mod A or less to support balance OT Short Term Goal 3 (Week 1): Pt will complete RW transfers to toilet with CGA or less. OT Short Term Goal 4 (Week 1): Pt will demonstrate improved L side awareness during bathing to wash UB with min cues to attend to L arm.  Skilled Therapeutic Interventions/Progress Updates:  Pt received seated in [facility] for skilled OT session. Session focused on functional transfers and standing tolerance to improve performance with standing ADLs and decrease caregiver burden. Pt agreeable to interventions, despite demonstrating overall flat affect/mood. Pt with no reports of pain, stating "none that I can find." OT offering intermediate rest breaks and positioning suggestions throughout session to address potential pain/fatigue and maximize participation/safety in session.   Pt performs multiple STS from stedy perch with overall CGA-Min A. Pt tolerates ~1-2 min trials of standing, UE supported on steady, and Min A + L-knee blocking for buckling. Pt with increased difficulty maintaining stance initially, despite multimodal cuing, improving with subsequent trials and wider base of support. In standing, pt reaches to pull pants up, quickly losing stability, and requiring total A to pull up ill-fitting pants.   Pt performs WC>EOB transfer with stedy due to time constraints, requiring A for LE management to complete EOB>sup.   Pt remained resting in bed with all immediate needs met and bed alarm activated at end of  session. Pt continues to be appropriate for skilled OT intervention to promote further functional independence.   Therapy Documentation Precautions:  Precautions Precautions: Fall Precaution Comments: LT wrist pain with WB (fall 2 months ago on hand. No imaging) Restrictions Weight Bearing Restrictions: No   Therapy/Group: Individual Therapy  Maudie Mercury, OTR/L, MSOT  06/02/2022, 12:56 PM

## 2022-06-02 NOTE — Progress Notes (Signed)
Patient ID: Grant [name], male   DOB: 01-03-1946, 76 y.o.   MRN: [id number]  Team Conference Report to Patient/Family  Team Conference discussion was reviewed with the patient and caregiver, including goals, any changes in plan of care and target discharge date.  Patient and caregiver express understanding and are in agreement.  The patient has a target discharge date of 06/16/22.  Sw met with patient and spoke with spouse, Inez Catalina via telephone. Patient and spouse would like to know the potential of patient discharging home before Christmas Day. If patient is not making progress needed to meet goals for d/c sooner, then patient and spouse are agreeable to keeping current discharge date. Sw will discuss with team and then follow up with pt and spouse.  Dyanne Iha 06/02/2022, 2:37 PM

## 2022-06-02 NOTE — Patient Care Conference (Incomplete)
Inpatient RehabilitationTeam Conference and Plan of Care Update Date: 06/02/2022   Time: 7:50 AM    Patient Name: Grant [name]      Medical Record Number: [id number]  Date of Birth: 08-17-1945 Sex: Male         Room/Bed: 4M13C/4M13C-01 Payor Info: Payor: [employer] / Plan: [facility] / Product Type: *No Product type* /    Admit Date/Time:  05/26/2022  6:32 PM  Primary Diagnosis:  Right middle cerebral artery stroke [facility])  Hospital Problems: Principal Problem:   Right middle cerebral artery stroke [facility])    Expected Discharge Date:    Team Members Present:       Current Status/Progress Goal Weekly Team Focus  Bowel/Bladder   Continent of bowel and bladder. Use of external catheter at night.  *** Continue to maintain continence. Continue to work towards preventing infection.   Continue to assist with tolieting needs. Encourage patient to void prior to bed.    Swallow/Nutrition/ Hydration      ***         ADL's   mod - max A due to severe L neglect, L sensory deficits, LUE incoordination, postural impairments, decreased cognition; pt has had a decrease in functions from initial evaluation. On initial evaluation he was needing max A but his L side and balance impairments were not as severe  *** CGA overall   ADL training, balance, L side awareness and coordination    Mobility      ***         Communication      ***          Safety/Cognition/ Behavioral Observations     ***          Pain   No pain outside of headaches.  *** Continue to maintain pain goal <3/10.   Assess QShift and PRN.    Skin   No skin issues to report  *** Continue to maintain skin integrity and prevent breakdown of skin. Encourage patient to reposition self to reduce breakdown.  Assess Qshift and PRN.      Discharge Planning:  Discharging home with spouse, daughter has returned home. Spouse anticipating assisting sup/Min A. [facility] pt (please enter  reccs ASAP)   Team Discussion: *** Patient on target to meet rehab goals: {IP REHAB YES/NO WITH [id number]:94496}  *See Care Plan and progress notes for long and short-term goals.   Revisions to Treatment Plan:  ***  Teaching Needs: ***  Current Barriers to Discharge: {BARRIERS TO PRFFMBWGY:65993}  Possible Resolutions to Barriers: ***     Medical Summary               I attest that I was present, lead the team conference, and concur with the assessment and plan of the team.   Craig Guess 06/02/2022, 7:50 AM

## 2022-06-02 NOTE — Progress Notes (Addendum)
PROGRESS NOTE   Subjective/Complaints:  Difficult to awaken this am , no c/os , follows simple commands   ROS- neg CP, SOB, N/V/D Objective:   No results found. No results for input(s): "WBC", "HGB", "HCT", "PLT" in the last 72 hours.  No results for input(s): "NA", "K", "CL", "CO2", "GLUCOSE", "BUN", "CREATININE", "CALCIUM" in the last 72 hours.   Intake/Output Summary (Last 24 hours) at 06/02/2022 0800 Last data filed at 06/02/2022 0352 Gross per 24 hour  Intake 717 ml  Output 750 ml  Net -33 ml         Physical Exam: Vital Signs Blood pressure 110/83, pulse 75, temperature 98.8 F (37.1 C), resp. rate 19, height _0  (1.88 m), SpO2 94 %.   General: No acute distress Mood and affect are appropriate Heart: Regular rate and rhythm no rubs murmurs or extra sounds Lungs: Clear to auscultation, breathing unlabored, occ bibasilar wheezes Abdomen: Positive bowel sounds, soft nontender to palpation, nondistended Extremities: No clubbing, cyanosis, or edema Skin: No evidence of breakdown, no evidence of rash Neurologic: Cranial nerves II through XII intact, motor strength is 4/5 in bilateral deltoid, bicep, tricep, grip, hip flexor, knee extensors, ankle dorsiflexor and plantar flexor Sensory exam reduced  sensation to light touch  in right upper and left , has intact proprioception LLE Cerebellar exam normal finger to nose to finger as well as heel to shin in right  upper and lower extremities Dysmetria LUE and LLE Musculoskeletal: right hand intrinsic atrophy    Assessment/Plan: 1. Functional deficits which require 3+ hours per day of interdisciplinary therapy in a comprehensive inpatient rehab setting. Physiatrist is providing close team supervision and 24 hour management of active medical problems listed below. Physiatrist and rehab team continue to assess barriers to discharge/monitor patient progress toward  functional and medical goals  Care Tool:  Bathing    Body parts bathed by patient: Chest, Abdomen, Right upper leg, Left upper leg, Buttocks, Face, Left arm   Body parts bathed by helper: Front perineal area, Left lower leg, Right lower leg, Right arm     Bathing assist Assist Level: Moderate Assistance - Patient 50 - 74%     Upper Body Dressing/Undressing Upper body dressing   What is the patient wearing?: Pull over shirt    Upper body assist Assist Level: Moderate Assistance - Patient 50 - 74%    Lower Body Dressing/Undressing Lower body dressing      What is the patient wearing?: Underwear/pull up, Pants     Lower body assist Assist for lower body dressing: Maximal Assistance - Patient 25 - 49%     Toileting Toileting    Toileting assist Assist for toileting: Maximal Assistance - Patient 25 - 49%     Transfers Chair/bed transfer  Transfers assist     Chair/bed transfer assist level: Moderate Assistance - Patient 50 - 74%     Locomotion Ambulation   Ambulation assist      Assist level: Minimal Assistance - Patient > 75% Assistive device: Walker-rolling (required assitance w/ maintaing left hand on walker and RW navigation) Max distance: 102f   Walk 10 feet activity   Assist  Assist level: Minimal Assistance - Patient > 75% (required assitance w/ maintaing left hand on walker and RW navigation) Assistive device: Walker-rolling   Walk 50 feet activity   Assist    Assist level: Minimal Assistance - Patient > 75% (required assitance w/ maintaing left hand on walker and RW navigation) Assistive device: Walker-rolling    Walk 150 feet activity   Assist Walk 150 feet activity did not occur: Safety/medical concerns         Walk 10 feet on uneven surface  activity   Assist Walk 10 feet on uneven surfaces activity did not occur: Safety/medical concerns         Wheelchair     Assist Is the patient using a wheelchair?:  Yes Type of Wheelchair: Manual    Wheelchair assist level: Moderate Assistance - Patient 50 - 74% Max wheelchair distance: 101f (Using RUE & RLE)    Wheelchair 50 feet with 2 turns activity    Assist        Assist Level: Moderate Assistance - Patient 50 - 74%   Wheelchair 150 feet activity     Assist      Assist Level: Maximal Assistance - Patient 25 - 49%   Blood pressure 110/83, pulse 75, temperature 98.8 F (37.1 C), resp. rate 19, height _0  (1.88 m), SpO2 94 %.  Medical Problem List and Plan: 1. Functional deficits secondary to subacute right MCA ischemic infarction             -patient may  shower             -ELOS/Goals:  10 to 14 days , mod I to sup with PT/OT, sup with SLP          Team conference today please see physician documentation under team conference tab, met with team  to discuss problems,progress, and goals. Formulized individual treatment plan based on medical history, underlying problem and comorbidities.  Gabapentin reduced to qhs ,will d/c to see if am lethargy improves  expect improvement over the next 1-2 d with tx of UTI and med changes   2.  Antithrombotics: -DVT/anticoagulation:  Pharmaceutical: Eliquis             -antiplatelet therapy: Aspirin 81 mg daily 3. Pain Management: Neurontin 300 mg 3 times daily, Elavil 770mnightly, Flexeril 10 mg nightly as needed             -may have had some lethargy due to gabapentin , reduced to qhs on 12/9 4. Mood/Behavior/Sleep: Lexapro 10 mg nightly, Elavil 75 mg nightly.  Provide emotional support             -antipsychotic agents: N/A 5. Neuropsych/cognition: This patient is capable of making decisions on his own behalf. 6. Skin/Wound Care: Routine skin checks 7. Fluids/Electrolytes/Nutrition: Routine in and outs with follow-up chemistries 8.  Atrial fibrillation/AICD.  Continue amiodarone 200 mg daily as well as Eliquis.  Cardiac rate controlled 9.  Diabetes mellitus.  Hemoglobin A1c 9.9.   Semglee 32 units nightly.  SSI. Check blood sugars before meals and at bedtime CBG (last 3)  Recent Labs    06/01/22 1645 06/01/22 2111 06/02/22 0619  GLUCAP 109* 122* 111*   Reduce semglee 29U qhs  10.  Hyperlipidemia.  Lipitor 4070maily 11.  Chronic systolic congestive heart failure.  Monitor for any signs of fluid overload. On Furosemide 19m62mily.  Fluid intake poor will check BMET  12.  BPH.  Hytrin 5 mg twice daily-  reduced to 29m BID due to soft BP 13.  Tobacco use.  Provide counseling 14.  Hypothyroidism.  Synthroid 15.  CKD.  Baseline creatinine 1.43-1.94.  Follow-up chemistries 16. HTN. Long term goal normotensive. Home medications metoprolol and cozaar currently on hold. Monitor with activity. Vitals:   06/01/22 2205 06/02/22 0356  BP: 117/73 110/83  Pulse:  75  Resp:  19  Temp:  98.8 F (37.1 C)  SpO2:  94%  May reduce terazosin 16mBID  BMET today  25060m45 NS bolus on 12/9- ECHO showed EF 60-65%- ok BPs improving 12/12 17. Hypothyroidism. Continue synthroid   18.  Fever/AMS  improved + , UA + for e coli >100K sens to keflex , cont 500m53mD x 7d total  CT head no acute changes CXR clear  Has been mildlytachy prior to fever , cannot resume metoprolol due to soft BP , terazosin has been reduced , anticipate BP to increase and allow resumption of metoprolol  LOS: 7 days A FACE TO FACE EVALUATION WAS PERFORMED  AndrCharlett Blake13/2023, 8:00 AM

## 2022-06-02 NOTE — Progress Notes (Signed)
Occupational Therapy Session Note  Patient Details  Name: Grant [name] MRN: [id number] Date of Birth: Mar 23, 1946  Today's Date: 06/02/2022 OT Individual Time: 1100-1200 OT Individual Time Calculation (min): 60 min    Short Term Goals: Week 1:  OT Short Term Goal 1 (Week 1): Pt will demonstrate improved LUE coordination to don shirt with min A. OT Short Term Goal 2 (Week 1): Pt will demonstrate improved dynamic standing balance to pull pants over hips with mod A or less to support balance OT Short Term Goal 3 (Week 1): Pt will complete RW transfers to toilet with CGA or less. OT Short Term Goal 4 (Week 1): Pt will demonstrate improved L side awareness during bathing to wash UB with min cues to attend to L arm.  Skilled Therapeutic Interventions/Progress Updates:    Pt received sleeping soundly in bed, but did awaken with some gentle prodding.   Pt sat to EOB with CGA and completed squat pivot to R side with Min A to wc.  Pt sat at sink to complete oral care and denture care with min A.  Worked with L hand as a stabilizing A vs active A to avoid dropping dentures.  Pt taken to therapy gym to engage in various therapeutic NMR exercises to force use of L arm with L side perception.   Pt worked on holding dowel bar but kept dropping it out of L hand due to motor impersistence. Placed sticky dycem on L side and this texture helped pt with his awareness hand grip. Exercise with to work core strength to reach forward and hit dowel against ball held at various angles.  He was having difficulty with forward lean and reach. The therapy dog arrived for a visit and then pt was able to reach forward and use his trunk muscles well to reach and pat the dog.  Pt also used L hand to pet the dog and stated what her fur felt like.  Bilateral hold on large ball and pt had to reach ball through target of hula hoop. Pt did well with this activity and maintained his hold on the ball.  Pt returned to the room and  belt alarm on and all needs met.    Therapy Documentation Precautions:  Precautions Precautions: Fall Precaution Comments: LT wrist pain with WB (fall 2 months ago on hand. No imaging) Restrictions Weight Bearing Restrictions: No Pain:  no c/o pain    ADL: ADL Eating: Set up Grooming: Setup Upper Body Bathing: Moderate cueing, Minimal assistance Where Assessed-Upper Body Bathing: Sitting at sink Lower Body Bathing: Moderate assistance, Moderate cueing Upper Body Dressing: Moderate cueing, Moderate assistance Lower Body Dressing: Maximal assistance Toileting: Maximal assistance Where Assessed-Toileting: Glass blower/designer: Moderate assistance Toilet Transfer Method: Stand pivot   Therapy/Group: Individual Therapy  Lylee Corrow 06/02/2022, 8:46 AM

## 2022-06-02 NOTE — Patient Care Conference (Signed)
Inpatient RehabilitationTeam Conference and Plan of Care Update Date: 06/02/2022   Time: 10:13 AM    Patient Name: Grant [name]      Medical Record Number: [id number]  Date of Birth: January 27, 1946 Sex: Male         Room/Bed: 4M13C/4M13C-01 Payor Info: Payor: [employer] / Plan: [facility] / Product Type: *No Product type* /    Admit Date/Time:  05/26/2022  6:32 PM  Primary Diagnosis:  Right middle cerebral artery stroke [facility])  Hospital Problems: Principal Problem:   Right middle cerebral artery stroke [facility])    Expected Discharge Date: Expected Discharge Date: 06/16/22  Team Members Present: Physician leading conference: Dr. Alysia Penna Social Worker Present: Erlene Quan, BSW Nurse Present: Dorien Chihuahua, RN PT Present: Barrie Folk, PT OT Present: Meriel Pica, OT SLP Present: Sherren Kerns, SLP PPS Coordinator present : Gunnar Fusi, SLP     Current Status/Progress Goal Weekly Team Focus  Bowel/Bladder   Continent of bowel and bladder. Use of external catheter at night.   Continue to maintain continence. Continue to work towards preventing infection.   Continue to assist with tolieting needs. Encourage patient to void prior to bed.    Swallow/Nutrition/ Hydration               ADL's   mod - max A due to severe L neglect, L sensory deficits, LUE incoordination, postural impairments, decreased cognition; pt has had a decrease in functions from initial evaluation. On initial evaluation he was needing max A but his L side and balance impairments were not as severe   CGA overall   ADL training, balance, L side awareness and coordination    Mobility   recovering from UTI, bed mobility = supervision to MinA, transfers MinA to initiate and Mod/ MaxA for balance, ambulation with RW with MinA.   overall CGA/ MinA  NMR for coordination, transfers, ambulation, balance, family education    Communication                 Safety/Cognition/ Behavioral Observations  mod-to-max A. Pt recently limited by fatigue   min A   Basic-to-mildly complex problem solving, short-term memory with use of compensations, attention, and awareness    Pain   No pain outside of headaches.   Continue to maintain pain goal <3/10.   Assess QShift and PRN.    Skin   No skin issues to report   Continue to maintain skin integrity and prevent breakdown of skin. Encourage patient to reposition self to reduce breakdown.  Assess Qshift and PRN.      Discharge Planning:  Discharging home with spouse, daughter has returned home. Spouse anticipating assisting sup/Min A. [facility] pt (please enter reccs ASAP)   Team Discussion: Patient with UTI; incontinent at times.  MD adjusting medications. Patient with left neglect, heavy posterior lean, poor midline awareness, problem solving deficits, memory, recall, poor awareness and concentration issues post Right MCA CVA Progress limited by fatigue.  Patient on target to meet rehab goals: yes, currently ned mod assist overall due to perceptual deficits. Needs min- mod assist for bed mobility, min - mod assist for stand pivot transfers and able to ambulate up to 50' with mod assist x 2 to stabilize left hand and guide RW use.  Needs mod - max assist for cognition with min assist goals. Goals for discharge set for CGA overall.   *See Care Plan and progress notes for long and short-term goals.   Revisions to Treatment Plan:  SLP downgraded goals   Teaching Needs: Safety, medications, secondary risk management, transfers, toileting, etc.   Current Barriers to Discharge: Decreased caregiver support  Possible Resolutions to Barriers: Family education HH follow up services     Medical Summary Current Status: urinary incont  Barriers to Discharge: Incontinence   Possible Resolutions to Barriers/Weekly Focus: cont antibiotics for UTI, reduce pain meds due to side effects   Continued Need  for Acute Rehabilitation Level of Care: The patient requires daily medical management by a physician with specialized training in physical medicine and rehabilitation for the following reasons: Direction of a multidisciplinary physical rehabilitation program to maximize functional independence : Yes Medical management of patient stability for increased activity during participation in an intensive rehabilitation regime.: Yes Analysis of laboratory values and/or radiology reports with any subsequent need for medication adjustment and/or medical intervention. : Yes   I attest that I was present, lead the team conference, and concur with the assessment and plan of the team.   Chana Bode B 06/02/2022, 3:56 PM

## 2022-06-02 NOTE — Progress Notes (Signed)
Speech Language Pathology Weekly Progress and Session Note  Patient Details  Name: Grant [name] MRN: [id number] Date of Birth: 06-15-1946  Beginning of progress report period: May 27, 2022 End of progress report period: June 02, 2022  Today's Date: 06/02/2022 SLP Individual Time: 1400-1445 SLP Individual Time Calculation (min): 45 min  Short Term Goals: Week 1: SLP Short Term Goal 1 (Week 1): Patient will recall and demonstrate use of memory compensations to increase independence and safety at home with modA verbal cues. SLP Short Term Goal 1 - Progress (Week 1): Met SLP Short Term Goal 2 (Week 1): Patient will demonstrate functional problem solving for basic and familiar tasks with Mod A verbal cues. SLP Short Term Goal 2 - Progress (Week 1): Met SLP Short Term Goal 3 (Week 1): Pt will demonstrate awareness of errors during functional tasks with mod A verbal cues SLP Short Term Goal 3 - Progress (Week 1): Met SLP Short Term Goal 4 (Week 1): Pt will demonstrate selective attention by completing cognitive tasks within a mildly distracting environment with min-to-mod A verbal redirection cues. SLP Short Term Goal 4 - Progress (Week 1): Not met  New Short Term Goals: Week 2: SLP Short Term Goal 1 (Week 2): Patient will recall and demonstrate use of memory compensations to increase independence and safety at home with min-to-modA verbal cues. SLP Short Term Goal 2 (Week 2): Patient will demonstrate functional problem solving for basic and familiar tasks with Min-to-Mod A verbal cues. SLP Short Term Goal 3 (Week 2): Pt will demonstrate awareness of errors during functional tasks with min-to-mod A verbal cues SLP Short Term Goal 4 (Week 2): Pt will demonstrate selective attention by completing cognitive tasks within a mildly distracting environment with min-to-mod A verbal redirection cues.  Weekly Progress Updates: Patient has made slow gains and has been limited by fatigue.  Anticipated length of stay was extended as discussed in team conference due to initial slow progress toward goals limited by fatigue, but with recent improvement with alertness. Pt met 3 of 4 STGs this reporting period. Patient is currently completing tasks with overall mod-to-max A verbal and visual cues to complete functional and basic-to-mildly complex tasks accurately and safely. SLP continues to address problem solving, attention, awareness, and memory goals. Patient and family education is ongoing. Patient would benefit from continued skilled SLP intervention to maximize cognitive functioning and overall functional independence prior to discharge.   Intensity: Minumum of 1-2 x/day, 30 to 90 minutes Frequency: 3 to 5 out of 7 days Duration/Length of Stay: 12/27 Treatment/Interventions: Cognitive remediation/compensation;Functional tasks;Internal/external aids;Patient/family education;Therapeutic Activities  Daily Session  Skilled Therapeutic Interventions: Skilled ST treatment focused on cognitive goals. Pt was greeted upright in wheelchair on arrival and endorsed sleepiness, although able to remain alert and participative throughout session today which is significant improvement as compared to recent therapy sessions. SLP facilitated session with a novel card game Juel Burrow) targeting memory with use of memory compensations, sustained attention, and problem solving, and reasoning skills. SLP supporting a similar task that was completed yesterday to analyze carry over. Pt completed task with min fading to sup A verbal cues for problem solving and error awareness. Pt utilized memory compensations via external aid to recall task instructions with sup A verbal cues. Despite today's task being slightly less complex than yesterday's, pt exhibited improved processing, problem solving, memory, and attention skills. Pt sustained attention for duration of session with min A verbal redirection cues, easily distracted  by external stimuli but easy  to redirect. Patient was left in wheelchair with alarm activated and immediate needs within reach at end of session. Continue per current plan of care.       General    Pain Pain Assessment Pain Score: 0-No pain  Therapy/Group: Individual Therapy  Patty Sermons 06/02/2022, 12:51 PM

## 2022-06-03 LAB — GLUCOSE, CAPILLARY
Glucose-Capillary: 116 mg/dL — ABNORMAL HIGH (ref 70–99)
Glucose-Capillary: 113 mg/dL — ABNORMAL HIGH (ref 70–99)
Glucose-Capillary: 113 mg/dL — ABNORMAL HIGH (ref 70–99)
Glucose-Capillary: 123 mg/dL — ABNORMAL HIGH (ref 70–99)
Glucose-Capillary: 148 mg/dL — ABNORMAL HIGH (ref 70–99)
Glucose-Capillary: 135 mg/dL — ABNORMAL HIGH (ref 70–99)

## 2022-06-03 NOTE — Progress Notes (Signed)
PROGRESS NOTE   Subjective/Complaints:  Tired this am but slept ok per pt   ROS- neg CP, SOB, N/V/D Objective:   No results found. No results for input(s): "WBC", "HGB", "HCT", "PLT" in the last 72 hours.  No results for input(s): "NA", "K", "CL", "CO2", "GLUCOSE", "BUN", "CREATININE", "CALCIUM" in the last 72 hours.   Intake/Output Summary (Last 24 hours) at 06/03/2022 0752 Last data filed at 06/03/2022 [id number] Gross per 24 hour  Intake 714 ml  Output 1900 ml  Net -1186 ml         Physical Exam: Vital Signs Blood pressure 111/74, pulse 72, temperature 97.9 F (36.6 C), temperature source Oral, resp. rate 16, height 6\' 2"  (1.88 m), SpO2 99 %.   General: No acute distress Mood and affect are appropriate Heart: Regular rate and rhythm no rubs murmurs or extra sounds Lungs: Clear to auscultation, breathing unlabored, occ bibasilar wheezes Abdomen: Positive bowel sounds, soft nontender to palpation, nondistended Extremities: No clubbing, cyanosis, or edema Skin: No evidence of breakdown, no evidence of rash Neurologic: Cranial nerves II through XII intact, motor strength is 4/5 in bilateral deltoid, bicep, tricep, grip, hip flexor, knee extensors, ankle dorsiflexor and plantar flexor Sensory exam reduced  sensation to light touch  in right upper and left , has intact proprioception LLE Cerebellar exam normal finger to nose to finger as well as heel to shin in right  upper and lower extremities Dysmetria LUE and LLE Musculoskeletal: right hand intrinsic atrophy    Assessment/Plan: 1. Functional deficits which require 3+ hours per day of interdisciplinary therapy in a comprehensive inpatient rehab setting. Physiatrist is providing close team supervision and 24 hour management of active medical problems listed below. Physiatrist and rehab team continue to assess barriers to discharge/monitor patient progress toward  functional and medical goals  Care Tool:  Bathing    Body parts bathed by patient: Chest, Abdomen, Right upper leg, Left upper leg, Buttocks, Face, Left arm   Body parts bathed by helper: Front perineal area, Left lower leg, Right lower leg, Right arm     Bathing assist Assist Level: Moderate Assistance - Patient 50 - 74%     Upper Body Dressing/Undressing Upper body dressing   What is the patient wearing?: Pull over shirt    Upper body assist Assist Level: Moderate Assistance - Patient 50 - 74%    Lower Body Dressing/Undressing Lower body dressing      What is the patient wearing?: Underwear/pull up, Pants     Lower body assist Assist for lower body dressing: Maximal Assistance - Patient 25 - 49%     Toileting Toileting    Toileting assist Assist for toileting: Maximal Assistance - Patient 25 - 49%     Transfers Chair/bed transfer  Transfers assist     Chair/bed transfer assist level: Moderate Assistance - Patient 50 - 74%     Locomotion Ambulation   Ambulation assist      Assist level: Minimal Assistance - Patient > 75% Assistive device: Walker-rolling (required assitance w/ maintaing left hand on walker and RW navigation) Max distance: 21ft   Walk 10 feet activity   Assist  Assist level: Minimal Assistance - Patient > 75% (required assitance w/ maintaing left hand on walker and RW navigation) Assistive device: Walker-rolling   Walk 50 feet activity   Assist    Assist level: Minimal Assistance - Patient > 75% (required assitance w/ maintaing left hand on walker and RW navigation) Assistive device: Walker-rolling    Walk 150 feet activity   Assist Walk 150 feet activity did not occur: Safety/medical concerns         Walk 10 feet on uneven surface  activity   Assist Walk 10 feet on uneven surfaces activity did not occur: Safety/medical concerns         Wheelchair     Assist Is the patient using a wheelchair?:  Yes Type of Wheelchair: Manual    Wheelchair assist level: Moderate Assistance - Patient 50 - 74% Max wheelchair distance: 17ft (Using RUE & RLE)    Wheelchair 50 feet with 2 turns activity    Assist        Assist Level: Moderate Assistance - Patient 50 - 74%   Wheelchair 150 feet activity     Assist      Assist Level: Maximal Assistance - Patient 25 - 49%   Blood pressure 111/74, pulse 72, temperature 97.9 F (36.6 C), temperature source Oral, resp. rate 16, height 6\' 2"  (1.88 m), SpO2 99 %.  Medical Problem List and Plan: 1. Functional deficits secondary to subacute right MCA ischemic infarction             -patient may  shower             -ELOS/Goals:  12/27, family hoping to d/c prior to 12/25  Gabapentin reduced to qhs ,will d/c to see if am lethargy improves  expect improvement over the next 1-2 d with tx of UTI and med changes   2.  Antithrombotics: -DVT/anticoagulation:  Pharmaceutical: Eliquis             -antiplatelet therapy: Aspirin 81 mg daily 3. Pain Management: Neurontin 300 mg 3 times daily, Elavil 75mg  nightly, Flexeril 10 mg nightly as needed             -may have had some lethargy due to gabapentin , reduced to qhs on 12/9 4. Mood/Behavior/Sleep: Lexapro 10 mg nightly, Elavil 75 mg nightly.  Provide emotional support             -antipsychotic agents: N/A 5. Neuropsych/cognition: This patient is capable of making decisions on his own behalf. 6. Skin/Wound Care: Routine skin checks 7. Fluids/Electrolytes/Nutrition: Routine in and outs with follow-up chemistries 8.  Atrial fibrillation/AICD.  Continue amiodarone 200 mg daily as well as Eliquis.  Cardiac rate controlled 9.  Diabetes mellitus.  Hemoglobin A1c 9.9.  Semglee 32 units nightly.  SSI. Check blood sugars before meals and at bedtime CBG (last 3)  Recent Labs    06/02/22 1641 06/02/22 2049 06/03/22 0621  GLUCAP 113* 126* 116*   Reduce semglee 29U qhs- well controlled   10.   Hyperlipidemia.  Lipitor 40mg  daily 11.  Chronic systolic congestive heart failure.  Monitor for any signs of fluid overload. On Furosemide 20mg  daily.  Fluid intake poor will check BMET  12.  BPH.  Hytrin 5 mg twice daily- reduced to 1mg  BID due to soft BP 13.  Tobacco use.  Provide counseling 14.  Hypothyroidism.  Synthroid 15.  CKD.  Baseline creatinine 1.43-1.94.  Follow-up chemistries 16. HTN. Long term goal normotensive. Home medications metoprolol and cozaar  currently on hold. Monitor with activity. Vitals:   06/02/22 2053 06/03/22 0331  BP: 126/84 111/74  Pulse: 74 72  Resp: 16 16  Temp: 97.9 F (36.6 C)   SpO2: 97% 99%  May reduce terazosin 1mg  BID  BMET today  .45 NS bolus on 12/9- ECHO showed EF 60-65%- ok BPs improving 12/14 17. Hypothyroidism. Continue synthroid   18.  Fever/AMS  improved + , UA + for e coli >100K sens to keflex , cont 500mg  TID x 7d total  CT head no acute changes CXR clear  Tachy improved likely with hydration status  LOS: 8 days A FACE TO FACE EVALUATION WAS PERFORMED  1/15 06/03/2022, 7:52 AM

## 2022-06-03 NOTE — Progress Notes (Signed)
Occupational Therapy Session Note  Patient Details  Name: Grant [name] MRN: [id number] Date of Birth: December 28, 1945  Today's Date: 06/03/2022 OT Individual Time: 0950-1100 OT Individual Time Calculation (min): 70 min    Short Term Goals: Week 1:  OT Short Term Goal 1 (Week 1): Pt will demonstrate improved LUE coordination to don shirt with min A. OT Short Term Goal 2 (Week 1): Pt will demonstrate improved dynamic standing balance to pull pants over hips with mod A or less to support balance OT Short Term Goal 3 (Week 1): Pt will complete RW transfers to toilet with CGA or less. OT Short Term Goal 4 (Week 1): Pt will demonstrate improved L side awareness during bathing to wash UB with min cues to attend to L arm.     Skilled Therapeutic Interventions/Progress Updates:    Pt received in bed sound asleep. It took several minutes for pt to become alert, but then pt did wake up.  He sat to EOB with extra time and mod A as he continues to have a posterior lean and needs tactile cues to bring shoulders forward during rolling process.  In static sit, needs cues to keep shoulders over thighs to avoid falling back. Used stedy lift with CGA.  Tolerated sitting in perched postion to be at sink to complete oral/denture care and UB bathing and dressing. In perched position, pt actually had better upright posture than when he sits.  Pt has improved LUE coordination with self care tasks needing less A.  Moved to wc to wash legs. Pt able to wash to feet by reaching forward and even donned pants over feet without A.  Total A for ted hose, socks and boots. Pt stood facing side of bed to use upright rail for support. He then was able to use R hand to pull pants over hips with min A.  Continues to have a lean in standing.  Pt sat in wc and worked on upright posture using trunk muscles.  Taken to gym to try arm bike again.  Told pt his goal was to maintain his L hand grasp on handle to avoid it falling. Pt  said his hand was too weak. Did dynameter test: R grasp 60 lbs and L 45 lbs. Showed pt it is not strength as much as it is his sensory awareness and perceptual awareness and he has to initially pay more visual attention to hand. Worked on arm bike for about 15 min with frequent cues to focus on L hand.  Pt returned to room with all needs met.  Belt alarm on.      Therapy Documentation Precautions:  Precautions Precautions: Fall Precaution Comments: LT wrist pain with WB (fall 2 months ago on hand. No imaging) Restrictions Weight Bearing Restrictions: No   Pain: no c/o pain    ADL: ADL Eating: Set up Grooming: Setup Upper Body Bathing: Moderate assistance, Supervision/safety Where Assessed-Upper Body Bathing: Sitting at sink Lower Body Bathing: Moderate assistance, Moderate cueing Upper Body Dressing: Minimal assistance, Moderate cueing Lower Body Dressing: Moderate assistance, Moderate cueing Toileting: Maximal assistance Where Assessed-Toileting: Glass blower/designer: Moderate assistance Toilet Transfer Method: Stand pivot    Therapy/Group: Individual Therapy  [place] 06/03/2022, 12:14 PM

## 2022-06-03 NOTE — Progress Notes (Signed)
Speech Language Pathology Daily Session Note  Patient Details  Name: Grant [name] MRN: [id number] Date of Birth: 1946-06-03  Today's Date: 06/03/2022 SLP Individual Time: 4627-0350 SLP Individual Time Calculation (min): 45 min  Short Term Goals: Week 2: SLP Short Term Goal 1 (Week 2): Patient will recall and demonstrate use of memory compensations to increase independence and safety at home with min-to-modA verbal cues. SLP Short Term Goal 2 (Week 2): Patient will demonstrate functional problem solving for basic and familiar tasks with Min-to-Mod A verbal cues. SLP Short Term Goal 3 (Week 2): Pt will demonstrate awareness of errors during functional tasks with min-to-mod A verbal cues SLP Short Term Goal 4 (Week 2): Pt will demonstrate selective attention by completing cognitive tasks within a mildly distracting environment with min-to-mod A verbal redirection cues.  Skilled Therapeutic Interventions: Skilled ST treatment focused on cognitive goals. Pt was sleeping soundly on arrival and required moderate verbal stimulation, light touch on arm, and extended time to rouse. Pt remained quite drowsy for first half of session but became increasingly more alert following attempts to increase stimulation (I.e., elevate HOB, turn on lights, open blinds, wash face with warm washcloth). Pt's breakfast tray was sitting untouched at bedside. SLP provided set-up A and pt agreeable to eat. Pt alternated attention between meal and functional discussions regarding events from previous therapy sessions, home environment, and intellectual and emergent awareness of deficits with overall sup A for attention and max A for awareness. Pt reporting he would have someone with him at home but doesn't anticipate he will require any hands on assist, as pt stated he has had a therapist with him during recent transfers and ambulation tasks but not requiring any physical assist. Per latest PT note, pt requiring mod A for  WC transfers with moderate posterior bias and moderate A for gait training. Pt with decreased awareness of the level of support he is currently requiring. Patient was left in bed with alarm activated and immediate needs within reach at end of session. Continue per current plan of care.      Pain  None/denied  Therapy/Group: Individual Therapy  Tamala Ser 06/03/2022, 8:29 AM

## 2022-06-04 LAB — GLUCOSE, CAPILLARY
Glucose-Capillary: 104 mg/dL — ABNORMAL HIGH (ref 70–99)
Glucose-Capillary: 106 mg/dL — ABNORMAL HIGH (ref 70–99)
Glucose-Capillary: 129 mg/dL — ABNORMAL HIGH (ref 70–99)

## 2022-06-04 MED ORDER — NORTRIPTYLINE HCL 25 MG PO CAPS
75.0000 mg | ORAL_CAPSULE | Freq: Every day | ORAL | Status: DC
  Administered 2022-06-04 – 2022-06-08 (×5): 75 mg via ORAL
  Filled 2022-06-04 (×5): qty 3

## 2022-06-04 NOTE — Progress Notes (Signed)
PROGRESS NOTE   Subjective/Complaints: No issues overnite , eating breakfast   ROS- neg CP, SOB, N/V/D Objective:   No results found. No results for input(s): "WBC", "HGB", "HCT", "PLT" in the last 72 hours.  No results for input(s): "NA", "K", "CL", "CO2", "GLUCOSE", "BUN", "CREATININE", "CALCIUM" in the last 72 hours.   Intake/Output Summary (Last 24 hours) at 06/04/2022 0744 Last data filed at 06/04/2022 0345 Gross per 24 hour  Intake 600 ml  Output 670 ml  Net -70 ml         Physical Exam: Vital Signs Blood pressure 104/76, pulse 74, temperature 98.3 F (36.8 C), temperature source Oral, resp. rate 18, height 6\' 2"  (1.88 m), SpO2 99 %.   General: No acute distress Mood and affect are appropriate Heart: Regular rate and rhythm no rubs murmurs or extra sounds Lungs: Clear to auscultation, breathing unlabored, occ bibasilar wheezes Abdomen: Positive bowel sounds, soft nontender to palpation, nondistended Extremities: No clubbing, cyanosis, or edema Skin: No evidence of breakdown, no evidence of rash Neurologic: Cranial nerves II through XII intact, motor strength is 4/5 in bilateral deltoid, bicep, tricep, grip, hip flexor, knee extensors, ankle dorsiflexor and plantar flexor Sensory exam reduced  sensation to light touch  in right upper and left , has intact proprioception LLE Cerebellar exam normal finger to nose to finger as well as heel to shin in right  upper and lower extremities Dysmetria LUE and LLE Musculoskeletal: right hand intrinsic atrophy    Assessment/Plan: 1. Functional deficits which require 3+ hours per day of interdisciplinary therapy in a comprehensive inpatient rehab setting. Physiatrist is providing close team supervision and 24 hour management of active medical problems listed below. Physiatrist and rehab team continue to assess barriers to discharge/monitor patient progress toward  functional and medical goals  Care Tool:  Bathing    Body parts bathed by patient: Chest, Abdomen, Right upper leg, Left upper leg, Buttocks, Face, Left arm   Body parts bathed by helper: Front perineal area, Left lower leg, Right lower leg, Right arm     Bathing assist Assist Level: Moderate Assistance - Patient 50 - 74%     Upper Body Dressing/Undressing Upper body dressing   What is the patient wearing?: Pull over shirt    Upper body assist Assist Level: Moderate Assistance - Patient 50 - 74%    Lower Body Dressing/Undressing Lower body dressing      What is the patient wearing?: Underwear/pull up, Pants     Lower body assist Assist for lower body dressing: Maximal Assistance - Patient 25 - 49%     Toileting Toileting    Toileting assist Assist for toileting: Maximal Assistance - Patient 25 - 49%     Transfers Chair/bed transfer  Transfers assist     Chair/bed transfer assist level: Moderate Assistance - Patient 50 - 74%     Locomotion Ambulation   Ambulation assist      Assist level: Minimal Assistance - Patient > 75% Assistive device: Walker-rolling (required assitance w/ maintaing left hand on walker and RW navigation) Max distance: 22ft   Walk 10 feet activity   Assist     Assist level: Minimal  Assistance - Patient > 75% (required assitance w/ maintaing left hand on walker and RW navigation) Assistive device: Walker-rolling   Walk 50 feet activity   Assist    Assist level: Minimal Assistance - Patient > 75% (required assitance w/ maintaing left hand on walker and RW navigation) Assistive device: Walker-rolling    Walk 150 feet activity   Assist Walk 150 feet activity did not occur: Safety/medical concerns         Walk 10 feet on uneven surface  activity   Assist Walk 10 feet on uneven surfaces activity did not occur: Safety/medical concerns         Wheelchair     Assist Is the patient using a wheelchair?:  Yes Type of Wheelchair: Manual    Wheelchair assist level: Moderate Assistance - Patient 50 - 74% Max wheelchair distance: 10ft (Using RUE & RLE)    Wheelchair 50 feet with 2 turns activity    Assist        Assist Level: Moderate Assistance - Patient 50 - 74%   Wheelchair 150 feet activity     Assist      Assist Level: Maximal Assistance - Patient 25 - 49%   Blood pressure 104/76, pulse 74, temperature 98.3 F (36.8 C), temperature source Oral, resp. rate 18, height 6\' 2"  (1.88 m), SpO2 99 %.  Medical Problem List and Plan: 1. Functional deficits secondary to subacute right MCA ischemic infarction             -patient may  shower             -ELOS/Goals:  12/27, family hoping to d/c prior to 12/25  Gabapentin reduced to qhs ,will d/c to see if am lethargy improves  expect improvement over the next 1-2 d with tx of UTI and med changes   2.  Antithrombotics: -DVT/anticoagulation:  Pharmaceutical: Eliquis             -antiplatelet therapy: Aspirin 81 mg daily 3. Pain Management: Neurontin 300 mg 3 times daily, Elavil 75mg  nightly, Flexeril 10 mg nightly as needed             off gabapentin due to drowsiness no worsening of pain 12/15 4. Mood/Behavior/Sleep: Lexapro 10 mg nightly, Elavil 75 mg nightly.  Provide emotional support             -antipsychotic agents: N/A 5. Neuropsych/cognition: This patient is capable of making decisions on his own behalf. 6. Skin/Wound Care: Routine skin checks 7. Fluids/Electrolytes/Nutrition: Routine in and outs with follow-up chemistries 8.  Atrial fibrillation/AICD.  Continue amiodarone 200 mg daily as well as Eliquis.  Cardiac rate controlled 9.  Diabetes mellitus.  Hemoglobin A1c 9.9.  Semglee 32 units nightly.  SSI. Check blood sugars before meals and at bedtime CBG (last 3)  Recent Labs    06/03/22 2027 06/03/22 2055 06/04/22 0625  GLUCAP 148* 135* 104*   Reduce semglee 29U qhs- well controlled   10.  Hyperlipidemia.   Lipitor 40mg  daily 11.  Chronic systolic congestive heart failure.  Monitor for any signs of fluid overload. On Furosemide 20mg  daily.  Fluid intake poor will check BMET  12.  BPH.  Hytrin 5 mg twice daily- reduced to 1mg  BID due to soft BP 13.  Tobacco use.  Provide counseling 14.  Hypothyroidism.  Synthroid 15.  CKD.  Baseline creatinine 1.43-1.94.  Follow-up chemistries 16. HTN. Long term goal normotensive. Home medications metoprolol and cozaar currently on hold. Monitor with activity. Vitals:  06/04/22 0345 06/04/22 0358  BP:  104/76  Pulse:  74  Resp: 16 18  Temp:  98.3 F (36.8 C)  SpO2:  99%  May reduce terazosin 1mg  BID  BMET today  .45 NS bolus on 12/9- ECHO showed EF 60-65%- ok BPs still soft but asymptomatic will change amitriptyline to nortriptyline 17. Hypothyroidism. Continue synthroid   18.  Fever/AMS  improved + , UA + for e coli >100K sens to keflex , cont 500mg  TID x 7d total  CT head no acute changes CXR clear  Tachy improved likely with hydration status  LOS: 9 days A FACE TO FACE EVALUATION WAS PERFORMED  14/9 06/04/2022, 7:44 AM

## 2022-06-04 NOTE — Progress Notes (Signed)
Speech Language Pathology Daily Session Note  Patient Details  Name: Grant [name] MRN: [id number] Date of Birth: 02/11/1946  Today's Date: 06/04/2022 SLP Individual Time: 1451-1535 SLP Individual Time Calculation (min): 44 min  Short Term Goals: Week 2: SLP Short Term Goal 1 (Week 2): Patient will recall and demonstrate use of memory compensations to increase independence and safety at home with min-to-modA verbal cues. SLP Short Term Goal 2 (Week 2): Patient will demonstrate functional problem solving for basic and familiar tasks with Min-to-Mod A verbal cues. SLP Short Term Goal 3 (Week 2): Pt will demonstrate awareness of errors during functional tasks with min-to-mod A verbal cues SLP Short Term Goal 4 (Week 2): Pt will demonstrate selective attention by completing cognitive tasks within a mildly distracting environment with min-to-mod A verbal redirection cues.  Skilled Therapeutic Interventions: Skilled ST treatment focused on cognitive goals. Pt was greeted in bed on arrival and consuming a late lunch. SLP facilitated session by completing a medication management task to increase awareness of current medication regime. SLP provided overall max A fading to min A verbal cues for identifying pertinent medication information on individualized medication chart that SLP developed for pt which contained the following information: name of medication, dose, purpose for taking, and how many times a day. SLP facilitated medication label comprehension with min A verbal cues for organization, and mod A verbal cues for problem solving. Pt selectively attended to session within a mildly distracting environment (door open to room, TV on in background although no volume) with min A verbal redirection cues. Pt reported he uses a pillbox to organize medications at home and spouse sometimes assists though not consistently. Will continue efforts with medication management tasks to increase functional  independence, accuracy, and understanding.  Patient was left in bed with alarm activated and immediate needs within reach at end of session. Continue per current plan of care.    Pain  None/denied  Therapy/Group: Individual Therapy  Tamala Ser 06/04/2022, 2:57 PM

## 2022-06-04 NOTE — Progress Notes (Signed)
Physical Therapy Session Note  Patient Details  Name: Grant [name] MRN: [id number] Date of Birth: Jul 21, 1945  Today's Date: 06/04/2022 PT Individual Time: 5956-3875 and 1535-1630 PT Individual Time Calculation (min): 40 min and 55 min    Short Term Goals: Week 1:  PT Short Term Goal 1 (Week 1): Pt wil perfrom bed/chair transfers w/ supervison assist using LRAD PT Short Term Goal 2 (Week 1): Pt ambulate 75 ft w/ supervsion assist using LRAD PT Short Term Goal 3 (Week 1): Pt will naviagte 4 6 inch steps w/ min A while using hand rails PT Short Term Goal 4 (Week 1): Pt wil perfrom  functional otucome measure to predict fall risk PT Short Term Goal 5 (Week 1): Pt will perfrom bed mobility at supervison level conistently Week 2:    Week 3:     Skilled Therapeutic Interventions/Progress Updates:   Session 1  Pt received supine in bed and agreeable to PT. Supine>sit transfer with *** assist and ***cues   BP assessment.   Gait training in parallel bars.   Gait training with RW in gym 2 x 91f.   Pt returned to room and performed ** transfer to bed with **. Sit>supine completed with ** and left supine in bed with call bell in reach and all needs met.    Session 2.  Pt received supine in bed and agreeable to PT. Supine>sit transfer with *** assist and ***cues   Stand pivot transfer.   Hygiene following incontinent bladder. NT made aware.   [facility]   Pt returned to room and performed ambulatory transfer to bed with **. Sit>supine completed with ** and left supine in bed with call bell in reach and all needs met.         Therapy Documentation Precautions:  Precautions Precautions: Fall Precaution Comments: LT wrist pain with WB (fall 2 months ago on hand. No imaging) Restrictions Weight Bearing Restrictions: No General:   Vital Signs:   Pain: Pain Assessment Pain Scale: 0-10 Pain Score: 0-No pain Mobility:   Locomotion  :    Trunk/Postural Assessment :    Balance:   Exercises:   Other Treatments:      Therapy/Group: Individual Therapy  ALorie Phenix12/15/2023, 11:21 AM

## 2022-06-04 NOTE — Progress Notes (Signed)
Occupational Therapy Weekly Progress Note  Patient Details  Name: Grant [name] MRN: [id number] Date of Birth: 1945-11-11  Beginning of progress report period: May 27, 2022 End of progress report period: June 04, 2022  Today's Date: 06/04/2022 OT Individual Time: 4098-1191 OT Individual Time Calculation (min): 75 min    Patient has met 3 of 4 short term goals.  Pt is making progress with his L side awareness and coordination and is progressing with supported standing balance.  His main obstacle is dynamic standing and his strong tendency to have a posterior lean in standing and walking.    Patient continues to demonstrate the following deficits: decreased cardiorespiratoy endurance, unbalanced muscle activation, decreased coordination, and decreased motor planning, decreased visual perceptual skills and decreased visual motor skills, decreased midline orientation and decreased attention to left, decreased awareness, decreased problem solving, decreased safety awareness, decreased memory, and delayed processing, and decreased sitting balance, decreased standing balance, decreased postural control, and decreased balance strategies and therefore will continue to benefit from skilled OT intervention to enhance overall performance with BADL and Reduce care partner burden.  Patient progressing toward long term goals..  Continue plan of care.  OT Short Term Goals Week 1:  OT Short Term Goal 1 (Week 1): Pt will demonstrate improved LUE coordination to don shirt with min A. OT Short Term Goal 1 - Progress (Week 1): Met OT Short Term Goal 2 (Week 1): Pt will demonstrate improved dynamic standing balance to pull pants over hips with mod A or less to support balance OT Short Term Goal 2 - Progress (Week 1): Met OT Short Term Goal 3 (Week 1): Pt will complete RW transfers to toilet with CGA or less. OT Short Term Goal 3 - Progress (Week 1): Progressing toward goal OT Short Term Goal 4  (Week 1): Pt will demonstrate improved L side awareness during bathing to wash UB with min cues to attend to L arm. OT Short Term Goal 4 - Progress (Week 1): Met Week 2:  OT Short Term Goal 1 (Week 2): Pt will be able to hold static stand balance with no UE support with CGA or less to prep for LB dressing. OT Short Term Goal 2 (Week 2): Pt will be able pull pants over hips with min A using 1 hand support on grab bar or rail. OT Short Term Goal 3 (Week 2): Pt will demostrate improved L side coordination to don a overhead shirt with supervision.  Skilled Therapeutic Interventions/Progress Updates:    Pt received in bed sleeping soundly. Initially pt declining therapy stating he was too tired and could I come back later. Explained that today due to both his and my schedule plans, that was not possible.  Pt needed max encouragement, then participated.  He was able to sit to EOB with supervision and MAX cues for body positioning. Pt often says he is weak and needs reminding that he actually has decent strength, but has impaired L side awareness.   Used stedy today to transition to shower seat.  Pt did extremely well with stedy, he was able to stand fully upright.   Pt sat on shower seat demonstrating good balance. Placed wash mit on his L hand (so he would not drop washcloth) and pt did well using his hand to wash R arm and thighs.  He used long sponge to wash feet.  Transferred back to wc to dress.  Pt first applied lotion and did well reaching forward to feet.  He  needed A to don button up shirt (unable to do small buttons on shirt), donned pants over feet with no A.  He stood up with only CGA by pulling up on rail, pt can stand with B hands support on rail with S for therapist to don briefs.  He tried to let go with hands to pull up pants but had a posterior lean. Mod A to recover.  He could stand with 1 hand on rail and used other to pull pants partially over hips.   Total A with TEDS and non slip socks.    Pt called his wife so I could talk to her about bathroom measurements. A tub bench will not fit in shower so he will need a shower seat.  He will also have to use a RW to access bathroom, so ambulation will need to be a main focus.  Doorway is only 25" wide.    Pt resting in wc with alarm on and all needs met.   Therapy Documentation Precautions:  Precautions Precautions: Fall Precaution Comments: LT wrist pain with WB (fall 2 months ago on hand. No imaging) Restrictions Weight Bearing Restrictions: No   Pain: no c/o pain  Pain Assessment Pain Scale: 0-10 Pain Score: 0-No pain ADL: ADL Eating: Set up Grooming: Setup Upper Body Bathing: Supervision/safety, Minimal cueing (using wash mit on L hand) Where Assessed-Upper Body Bathing: Shower Lower Body Bathing: Minimal assistance (using long handled sponge) Where Assessed-Lower Body Bathing: Shower Upper Body Dressing: Minimal assistance, Moderate cueing Where Assessed-Upper Body Dressing: Wheelchair Lower Body Dressing: Moderate assistance, Moderate cueing Where Assessed-Lower Body Dressing: Wheelchair Toileting: Maximal assistance Where Assessed-Toileting: Glass blower/designer: Moderate assistance Toilet Transfer Method: Stand pivot   Therapy/Group: Individual Therapy  Alva Broxson 06/04/2022, 12:26 PM

## 2022-06-04 NOTE — Progress Notes (Signed)
Physical Therapy Session Note  Patient Details  Name: Grant [name] MRN: [id number] Date of Birth: 1946/03/29  Today's Date: 06/04/2022 PT Individual Time: 7510-2585 PT Individual Time Calculation (min): 25 min  and Today's Date: 06/04/2022 PT Missed Time: 20 Minutes Missed Time Reason: Patient fatigue;Patient unwilling to participate  Short Term Goals: Week 1:  PT Short Term Goal 1 (Week 1): Pt wil perfrom bed/chair transfers w/ supervison assist using LRAD PT Short Term Goal 2 (Week 1): Pt ambulate 75 ft w/ supervsion assist using LRAD PT Short Term Goal 3 (Week 1): Pt will naviagte 4 6 inch steps w/ min A while using hand rails PT Short Term Goal 4 (Week 1): Pt wil perfrom  functional otucome measure to predict fall risk PT Short Term Goal 5 (Week 1): Pt will perfrom bed mobility at supervison level conistently   Skilled Therapeutic Interventions/Progress Updates:  Patient supine in bed and almost asleep on entrance to room. Patient easily roused, then alert and not immediately agreeable to PT session. Pt fatigued and not wanting to leave bed. Following educ for need to continue to mobilize each day/ each session, pt agreeable to bed level activity.   Patient with no pain complaint at start of session.  Neuromuscular Re-ed: NMR facilitated during session with focus on LLE muscle activation and proprioception. Pt guided in LLE SLR, SL abd/ add, heel slides and AAROM ankle DF/ PF. Performed to pt's tolerance and/ or quality breakdown.    Pt setup with lunch tray in front of him that has not yet been eaten. Biased needed objects to L side in order to promote L side attention and visual scanning.   NMR performed for improvements in motor control and coordination, balance, sequencing, judgement, and self confidence/ efficacy in performing all aspects of mobility at highest level of independence.   Patient supine and seated upright at end of session with brakes locked, bed alarm  set, and all needs within reach. Lunch tray set up in front of pt with shirt protector donned. NT notified as to pt's disposition in bed.   Therapy Documentation Precautions:  Precautions Precautions: Fall Precaution Comments: LT wrist pain with WB (fall 2 months ago on hand. No imaging) Restrictions Weight Bearing Restrictions: No General: PT Amount of Missed Time (min): 20 Minutes PT Missed Treatment Reason: Patient fatigue;Patient unwilling to participate Vital Signs: Therapy Vitals Temp: 97.7 F (36.5 C) Temp Source: Oral Pulse Rate: 78 Resp: 18 BP: 121/82 Patient Position (if appropriate): Lying Oxygen Therapy SpO2: 96 % O2 Device: Room Air Pain:  No pain related throughout session.   Therapy/Group: Individual Therapy  Loel Dubonnet PT, DPT, CSRS 06/04/2022, 5:09 PM

## 2022-06-04 NOTE — Progress Notes (Signed)
Patient ID: Grant [name], male   DOB: August 15, 1945, 76 y.o.   MRN: [id number]  Family education arranged with spouse 12/21 1-4 PM

## 2022-06-04 NOTE — Progress Notes (Signed)
Physical Therapy Session Note  Patient Details  Name: Grant [name] MRN: [id number] Date of Birth: 1945/12/27  Today's Date: 06/03/2022 PT Individual Time:1130-1200   30 min   Short Term Goals: Week 1:  PT Short Term Goal 1 (Week 1): Pt wil perfrom bed/chair transfers w/ supervison assist using LRAD PT Short Term Goal 2 (Week 1): Pt ambulate 75 ft w/ supervsion assist using LRAD PT Short Term Goal 3 (Week 1): Pt will naviagte 4 6 inch steps w/ min A while using hand rails PT Short Term Goal 4 (Week 1): Pt wil perfrom  functional otucome measure to predict fall risk PT Short Term Goal 5 (Week 1): Pt will perfrom bed mobility at supervison level conistently  Skilled Therapeutic Interventions/Progress Updates:  Session 1  Pt received sitting in WC and agreeable to PT. Pt transported to rehab gym with encouragement. PT performed orthostatic VS. Sitting: 126/92, HR 84. Standing 88/69. HR 78. Pt report dizziness 8/10.  Sitting 122/74, HR 102. Asymptomatic.  Performed 5xSTS 36 sec. Min assist for balance and to stabilize RW.  VS assess in standing following 5xSTS. 98/78. Dizziness 5/10.  Sitting 115/80 HR 84.   Pt returned to room and performed stand pivot transfer to bed with RW and min assist. Sit>supine completed with increased time and supervision assist.  and left supine in bed with call bell in reach and all needs met.   Session 2. Pt received supine in bed asleep. Aroused with effort and pt refusing PT services at this time. Edcuation for need for OOB mobility, but pt continues to decline due to fatigue. Will re-attempt at later time/date.       Therapy Documentation Precautions:  Precautions Precautions: Fall Precaution Comments: LT wrist pain with WB (fall 2 months ago on hand. No imaging) Restrictions Weight Bearing Restrictions: No General:   Missed time 60 min. Fatigue. Pt refused.  Vital Signs: Therapy Vitals Temp: 98.3 F (36.8 C) Temp Source: Oral Pulse  Rate: 74 Resp: 18 BP: 104/76 Patient Position (if appropriate): Lying Oxygen Therapy SpO2: 99 % O2 Device: Room Air Pain:   denies   Therapy/Group: Individual Therapy  [name] 06/04/2022, 7:49 AM

## 2022-06-05 DIAGNOSIS — E1122 Type 2 diabetes mellitus with diabetic chronic kidney disease: Secondary | ICD-10-CM

## 2022-06-05 DIAGNOSIS — N189 Chronic kidney disease, unspecified: Secondary | ICD-10-CM

## 2022-06-05 DIAGNOSIS — I10 Essential (primary) hypertension: Secondary | ICD-10-CM

## 2022-06-05 DIAGNOSIS — N3000 Acute cystitis without hematuria: Secondary | ICD-10-CM

## 2022-06-05 LAB — GLUCOSE, CAPILLARY
Glucose-Capillary: 71 mg/dL (ref 70–99)
Glucose-Capillary: 114 mg/dL — ABNORMAL HIGH (ref 70–99)
Glucose-Capillary: 98 mg/dL (ref 70–99)
Glucose-Capillary: 140 mg/dL — ABNORMAL HIGH (ref 70–99)

## 2022-06-05 MED ORDER — INSULIN GLARGINE-YFGN 100 UNIT/ML ~~LOC~~ SOLN
27.0000 [IU] | Freq: Every day | SUBCUTANEOUS | Status: DC
  Administered 2022-06-05 – 2022-06-07 (×3): 27 [IU] via SUBCUTANEOUS
  Filled 2022-06-05 (×4): qty 0.27

## 2022-06-05 NOTE — Progress Notes (Signed)
Physical Therapy Session Note  Patient Details  Name: Grant [name] MRN: [id number] Date of Birth: [birth year]  Today's Date: 06/05/2022 PT Individual Time: 2197-5883 PT Individual Time Calculation (min): 59 min   Short Term Goals: Week 1:  PT Short Term Goal 1 (Week 1): Pt wil perfrom bed/chair transfers w/ supervison assist using LRAD PT Short Term Goal 2 (Week 1): Pt ambulate 75 ft w/ supervsion assist using LRAD PT Short Term Goal 3 (Week 1): Pt will naviagte 4 6 inch steps w/ min A while using hand rails PT Short Term Goal 4 (Week 1): Pt wil perfrom  functional otucome measure to predict fall risk PT Short Term Goal 5 (Week 1): Pt will perfrom bed mobility at supervison level conistently   Skilled Therapeutic Interventions/Progress Updates:  Patient supine in bed asleep on entrance to room. Patient easily roused but takes time to fully wake and become alert. Pt agreeable to PT session.   Patient with no pain complaint at start of session.  Therapeutic Activity: Bed Mobility: Pt performed supine <> sit with ***. VC/ tc required for ***. Transfers: Pt performed sit<>stand and stand pivot transfers throughout session with ***. Provided verbal cues for***.  Gait Training:  Pt ambulated *** ft using *** with ***. Demonstrated ***. Provided vc/ tc for ***.  Wheelchair Mobility:  Pt propelled wheelchair *** feet with ***. Provided vc for ***.  Neuromuscular Re-ed: NMR facilitated during session with focus on***. Pt guided in ***. NMR performed for improvements in motor control and coordination, balance, sequencing, judgement, and self confidence/ efficacy in performing all aspects of mobility at highest level of independence.   Patient *** at end of session with brakes locked, *** alarm set, and all needs within reach.   Therapy Documentation Precautions:  Precautions Precautions: Fall Precaution Comments: LT wrist pain with WB (fall 2 months ago on hand. No  imaging) Restrictions Weight Bearing Restrictions: No General:   Vital Signs:   Pain:   Mobility:   Locomotion :    Trunk/Postural Assessment :    Balance:   Exercises:   Other Treatments:      Therapy/Group: Individual Therapy  Loel Dubonnet PT, DPT, CSRS 06/05/2022, 1:32 PM

## 2022-06-05 NOTE — Progress Notes (Signed)
PROGRESS NOTE   Subjective/Complaints: No new complaints this AM. Asked about discharge date.   ROS- neg CP, SOB, N/V/D, abdominal pain Objective:   No results found. No results for input(s): "WBC", "HGB", "HCT", "PLT" in the last 72 hours.  No results for input(s): "NA", "K", "CL", "CO2", "GLUCOSE", "BUN", "CREATININE", "CALCIUM" in the last 72 hours.   Intake/Output Summary (Last 24 hours) at 06/05/2022 1239 Last data filed at 06/05/2022 0532 Gross per 24 hour  Intake 420 ml  Output 650 ml  Net -230 ml         Physical Exam: Vital Signs Blood pressure 110/72, pulse 73, temperature 97.7 F (36.5 C), temperature source Oral, resp. rate 17, height 6\' 2"  (1.88 m), SpO2 91 %.   General: No acute distress Mood and affect are appropriate Heart: Regular rate and rhythm no rubs murmurs or extra sounds Lungs: Clear to auscultation, breathing unlabored, occ bibasilar wheezes, no increased WOB Abdomen: Positive bowel sounds, soft nontender to palpation, nondistended Extremities: No clubbing, cyanosis, or edema Skin: No evidence of breakdown, no evidence of rash, warm and dry Neurologic: Cranial nerves II through XII intact, motor strength is 4/5 in bilateral deltoid, bicep, tricep, grip, hip flexor, knee extensors, ankle dorsiflexor and plantar flexor Sensory exam reduced  sensation to light touch  in right upper and left , has intact proprioception LLE Cerebellar exam normal finger to nose to finger as well as heel to shin in right  upper and lower extremities Dysmetria LUE and LLE Musculoskeletal: right hand intrinsic atrophy    Assessment/Plan: 1. Functional deficits which require 3+ hours per day of interdisciplinary therapy in a comprehensive inpatient rehab setting. Physiatrist is providing close team supervision and 24 hour management of active medical problems listed below. Physiatrist and rehab team continue to  assess barriers to discharge/monitor patient progress toward functional and medical goals  Care Tool:  Bathing    Body parts bathed by patient: Chest, Abdomen, Right upper leg, Left upper leg, Buttocks, Face, Left arm   Body parts bathed by helper: Front perineal area, Left lower leg, Right lower leg, Right arm     Bathing assist Assist Level: Moderate Assistance - Patient 50 - 74%     Upper Body Dressing/Undressing Upper body dressing   What is the patient wearing?: Pull over shirt    Upper body assist Assist Level: Moderate Assistance - Patient 50 - 74%    Lower Body Dressing/Undressing Lower body dressing      What is the patient wearing?: Underwear/pull up, Pants     Lower body assist Assist for lower body dressing: Maximal Assistance - Patient 25 - 49%     Toileting Toileting    Toileting assist Assist for toileting: Maximal Assistance - Patient 25 - 49%     Transfers Chair/bed transfer  Transfers assist     Chair/bed transfer assist level: Moderate Assistance - Patient 50 - 74%     Locomotion Ambulation   Ambulation assist      Assist level: Minimal Assistance - Patient > 75% Assistive device: Walker-rolling (required assitance w/ maintaing left hand on walker and RW navigation) Max distance: 90ft   Walk 10 feet  activity   Assist     Assist level: Minimal Assistance - Patient > 75% (required assitance w/ maintaing left hand on walker and RW navigation) Assistive device: Walker-rolling   Walk 50 feet activity   Assist    Assist level: Minimal Assistance - Patient > 75% (required assitance w/ maintaing left hand on walker and RW navigation) Assistive device: Walker-rolling    Walk 150 feet activity   Assist Walk 150 feet activity did not occur: Safety/medical concerns         Walk 10 feet on uneven surface  activity   Assist Walk 10 feet on uneven surfaces activity did not occur: Safety/medical concerns          Wheelchair     Assist Is the patient using a wheelchair?: Yes Type of Wheelchair: Manual    Wheelchair assist level: Moderate Assistance - Patient 50 - 74% Max wheelchair distance: 36ft (Using RUE & RLE)    Wheelchair 50 feet with 2 turns activity    Assist        Assist Level: Moderate Assistance - Patient 50 - 74%   Wheelchair 150 feet activity     Assist      Assist Level: Maximal Assistance - Patient 25 - 49%   Blood pressure 110/72, pulse 73, temperature 97.7 F (36.5 C), temperature source Oral, resp. rate 17, height 6\' 2"  (1.88 m), SpO2 91 %.  Medical Problem List and Plan: 1. Functional deficits secondary to subacute right MCA ischemic infarction             -patient may  shower             -ELOS/Goals:  12/27, family hoping to d/c prior to 12/25  Gabapentin reduced to qhs ,will d/c to see if am lethargy improves  expect improvement over the next 1-2 d with tx of UTI and med changes   -Discussed DC date  2.  Antithrombotics: -DVT/anticoagulation:  Pharmaceutical: Eliquis             -antiplatelet therapy: Aspirin 81 mg daily 3. Pain Management: Neurontin 300 mg 3 times daily, Elavil 75mg  nightly, Flexeril 10 mg nightly as needed             off gabapentin due to drowsiness no worsening of pain 12/15 4. Mood/Behavior/Sleep: Lexapro 10 mg nightly, Elavil 75 mg nightly.  Provide emotional support             -antipsychotic agents: N/A 5. Neuropsych/cognition: This patient is capable of making decisions on his own behalf. 6. Skin/Wound Care: Routine skin checks 7. Fluids/Electrolytes/Nutrition: Routine in and outs with follow-up chemistries 8.  Atrial fibrillation/AICD.  Continue amiodarone 200 mg daily as well as Eliquis.  Cardiac rate controlled 9.  Diabetes mellitus.  Hemoglobin A1c 9.9.  Semglee 32 units nightly.  SSI. Check blood sugars before meals and at bedtime CBG (last 3)  Recent Labs    06/04/22 2047 06/05/22 0846 06/05/22 1202   GLUCAP 129* 71 114*   Reduce semglee 29U qhs- well controlled  12/16 reduce semglee to 27u   10.  Hyperlipidemia.  Lipitor 40mg  daily 11.  Chronic systolic congestive heart failure.  Monitor for any signs of fluid overload. On Furosemide 20mg  daily.  Fluid intake poor will check BMET  12.  BPH.  Hytrin 5 mg twice daily- reduced to 1mg  BID due to soft BP 13.  Tobacco use.  Provide counseling 14.  Hypothyroidism.  Synthroid 15.  CKD.  Baseline creatinine 1.43-1.94.  Follow-up chemistries  -Recheck Monday ordered 16. HTN. Long term goal normotensive. Home medications metoprolol and cozaar currently on hold. Monitor with activity. Vitals:   06/04/22 2105 06/05/22 0533  BP: 114/74 110/72  Pulse: 77 73  Resp: 17 17  Temp: 98.2 F (36.8 C) 97.7 F (36.5 C)  SpO2: 95% 91%  May reduce terazosin 1mg  BID  BMET today  .45 NS bolus on 12/9- ECHO showed EF 60-65%- ok BPs still soft but asymptomatic will change amitriptyline to nortriptyline 12/16 BP stable, monitor 17. Hypothyroidism. Continue synthroid   18.  Fever/AMS  improved + , UA + for e coli >100K sens to keflex , cont 500mg  TID x 7d total  CT head no acute changes CXR clear  Tachy improved likely with hydration status  12/16 HR  in 70s, improved, continue keflex  LOS: 10 days A FACE TO FACE EVALUATION WAS PERFORMED  06/05/2022, 12:39 PM

## 2022-06-06 DIAGNOSIS — M792 Neuralgia and neuritis, unspecified: Secondary | ICD-10-CM

## 2022-06-06 LAB — GLUCOSE, CAPILLARY
Glucose-Capillary: 87 mg/dL (ref 70–99)
Glucose-Capillary: 114 mg/dL — ABNORMAL HIGH (ref 70–99)
Glucose-Capillary: 96 mg/dL (ref 70–99)
Glucose-Capillary: 74 mg/dL (ref 70–99)

## 2022-06-06 MED ORDER — GUAIFENESIN-DM 100-10 MG/5ML PO SYRP: 5.0000 mL | ORAL_SOLUTION | ORAL | Status: DC | PRN

## 2022-06-06 MED ORDER — PREGABALIN 25 MG PO CAPS
25.0000 mg | ORAL_CAPSULE | Freq: Two times a day (BID) | ORAL | Status: DC
  Administered 2022-06-06 – 2022-06-24 (×37): 25 mg via ORAL
  Filled 2022-06-06 (×37): qty 1

## 2022-06-06 NOTE — Progress Notes (Signed)
Physical Therapy Weekly Progress Note  Patient Details  Name: Grant [name] MRN: [id number] Date of Birth: 17-Apr-1946  Beginning of progress report period: May 27, 2022 End of progress report period: June 04, 2022   Patient has met 1 of 5 short term goals.  Pt is making slow progress towards LTG of supervision assist overall. Pt experienced UTI with significant cognitive affect delaying progress by several days. Pt currecntly requires min-mod assist with posterior bias in balance in sitting and standing as well as mild orthostatic hypotension.   Patient continues to demonstrate the following deficits muscle weakness, muscle joint tightness, and muscle paralysis, decreased cardiorespiratoy endurance, impaired timing and sequencing, unbalanced muscle activation, decreased coordination, and decreased motor planning, decreased attention to left, decreased attention, decreased awareness, decreased problem solving, decreased safety awareness, decreased memory, and delayed processing, and decreased sitting balance, decreased standing balance, decreased postural control, hemiplegia, and decreased balance strategies and therefore will continue to benefit from skilled PT intervention to increase functional independence with mobility.  Patient progressing toward long term goals..  Continue plan of care.  PT Short Term Goals Week 1:  PT Short Term Goal 1 (Week 1): Pt wil perfrom bed/chair transfers w/ supervison assist using LRAD PT Short Term Goal 1 - Progress (Week 1): Not met PT Short Term Goal 2 (Week 1): Pt ambulate 75 ft w/ supervsion assist using LRAD PT Short Term Goal 2 - Progress (Week 1): Not met PT Short Term Goal 3 (Week 1): Pt will naviagte 4 6 inch steps w/ min A while using hand rails PT Short Term Goal 3 - Progress (Week 1): Not met PT Short Term Goal 4 (Week 1): Pt wil perfrom  functional otucome measure to predict fall risk PT Short Term Goal 4 - Progress (Week 1):  Met PT Short Term Goal 5 (Week 1): Pt will perfrom bed mobility at supervison level conistently PT Short Term Goal 5 - Progress (Week 1): Not met Week 2:  PT Short Term Goal 1 (Week 2): Pt will trasnfer to [facility] with CGA consistently PT Short Term Goal 2 (Week 2): Pt will ambulate 58f with CGA and no LOB PT Short Term Goal 3 (Week 2): Pt will tolerate standing >3 mintues to imrpove safety with ADLS and CGA  Skilled Therapeutic Interventions/Progress Updates:  Ambulation/gait training;Cognitive remediation/compensation;Discharge planning;DME/adaptive equipment instruction;Functional mobility training;Pain management;Psychosocial support;Therapeutic Activities;UE/LE Strength taining/ROM;Visual/perceptual remediation/compensation;Balance/vestibular training;Community reintegration;Disease management/prevention;Functional electrical stimulation;Neuromuscular re-education;Patient/family education;Stair training;Therapeutic Exercise;UE/LE Coordination activities;Wheelchair propulsion/positioning;Splinting/orthotics   Therapy Documentation Precautions:  Precautions Precautions: Fall Precaution Comments: LT wrist pain with WB (fall 2 months ago on hand. No imaging) Restrictions Weight Bearing Restrictions: No G  ALorie Phenix12/17/2023, 12:16 AM

## 2022-06-06 NOTE — Progress Notes (Signed)
PROGRESS NOTE   Subjective/Complaints: Reports pins and needles pain on left side of his body. Occasional cough. No additional concerns.   ROS- neg CP, SOB, N/V/D, abdominal pain, HA + pins and needles pain  Objective:   No results found. No results for input(s): "WBC", "HGB", "HCT", "PLT" in the last 72 hours.  No results for input(s): "NA", "K", "CL", "CO2", "GLUCOSE", "BUN", "CREATININE", "CALCIUM" in the last 72 hours.   Intake/Output Summary (Last 24 hours) at 06/06/2022 1033 Last data filed at 06/06/2022 0829 Gross per 24 hour  Intake 180 ml  Output 900 ml  Net -720 ml         Physical Exam: Vital Signs Blood pressure 116/72, pulse 72, temperature 97.6 F (36.4 C), temperature source Oral, resp. rate 18, height 6\' 2"  (1.88 m), SpO2 100 %.   General: No acute distress, seen at bedside Mood and affect are appropriate, pleasant Heart: Regular rate and rhythm no rubs murmurs or extra sounds Lungs: Clear to auscultation, breathing unlabored, occ bibasilar wheezes, no increased WOB Abdomen: Positive bowel sounds, soft nontender to palpation, nondistended Extremities: No clubbing, cyanosis, or edema Skin: No evidence of breakdown, no evidence of rash, warm and dry Neurologic: Cranial nerves II through XII intact, motor strength is 4/5 in bilateral deltoid, bicep, tricep, grip, hip flexor, knee extensors, ankle dorsiflexor and plantar flexor Sensory exam reduced  sensation to light touch  in right upper and left , has intact proprioception LLE Cerebellar exam normal finger to nose to finger as well as heel to shin in right  upper and lower extremities Dysmetria LUE and LLE Musculoskeletal: right hand intrinsic atrophy    Assessment/Plan: 1. Functional deficits which require 3+ hours per day of interdisciplinary therapy in a comprehensive inpatient rehab setting. Physiatrist is providing close team supervision and  24 hour management of active medical problems listed below. Physiatrist and rehab team continue to assess barriers to discharge/monitor patient progress toward functional and medical goals  Care Tool:  Bathing    Body parts bathed by patient: Chest, Abdomen, Right upper leg, Left upper leg, Buttocks, Face, Left arm   Body parts bathed by helper: Front perineal area, Left lower leg, Right lower leg, Right arm     Bathing assist Assist Level: Moderate Assistance - Patient 50 - 74%     Upper Body Dressing/Undressing Upper body dressing   What is the patient wearing?: Pull over shirt    Upper body assist Assist Level: Moderate Assistance - Patient 50 - 74%    Lower Body Dressing/Undressing Lower body dressing      What is the patient wearing?: Underwear/pull up, Pants     Lower body assist Assist for lower body dressing: Maximal Assistance - Patient 25 - 49%     Toileting Toileting    Toileting assist Assist for toileting: Maximal Assistance - Patient 25 - 49%     Transfers Chair/bed transfer  Transfers assist     Chair/bed transfer assist level: Moderate Assistance - Patient 50 - 74%     Locomotion Ambulation   Ambulation assist      Assist level: Minimal Assistance - Patient > 75% Assistive device: Walker-rolling (required  assitance w/ maintaing left hand on walker and RW navigation) Max distance: 11ft   Walk 10 feet activity   Assist     Assist level: Minimal Assistance - Patient > 75% (required assitance w/ maintaing left hand on walker and RW navigation) Assistive device: Walker-rolling   Walk 50 feet activity   Assist    Assist level: Minimal Assistance - Patient > 75% (required assitance w/ maintaing left hand on walker and RW navigation) Assistive device: Walker-rolling    Walk 150 feet activity   Assist Walk 150 feet activity did not occur: Safety/medical concerns         Walk 10 feet on uneven surface  activity   Assist  Walk 10 feet on uneven surfaces activity did not occur: Safety/medical concerns         Wheelchair     Assist Is the patient using a wheelchair?: Yes Type of Wheelchair: Manual    Wheelchair assist level: Moderate Assistance - Patient 50 - 74% Max wheelchair distance: 51ft (Using RUE & RLE)    Wheelchair 50 feet with 2 turns activity    Assist        Assist Level: Moderate Assistance - Patient 50 - 74%   Wheelchair 150 feet activity     Assist      Assist Level: Maximal Assistance - Patient 25 - 49%   Blood pressure 116/72, pulse 72, temperature 97.6 F (36.4 C), temperature source Oral, resp. rate 18, height 6\' 2"  (1.88 m), SpO2 100 %.  Medical Problem List and Plan: 1. Functional deficits secondary to subacute right MCA ischemic infarction             -patient may  shower             -ELOS/Goals:  12/27, family hoping to d/c prior to 12/25  Gabapentin reduced to qhs ,will d/c to see if am lethargy improves  expect improvement over the next 1-2 d with tx of UTI and med changes   -Discussed DC date  -Continue CIR PT/OT/SLP  2.  Antithrombotics: -DVT/anticoagulation:  Pharmaceutical: Eliquis             -antiplatelet therapy: Aspirin 81 mg daily 3. Pain Management: Neurontin 300 mg 3 times daily, Elavil 75mg  nightly, Flexeril 10 mg nightly as needed             off gabapentin due to drowsiness no worsening of pain 12/15  -Start low dose lyrica 25mg  BID for neuropathic pain 4. Mood/Behavior/Sleep: Lexapro 10 mg nightly, Elavil 75 mg nightly.  Provide emotional support             -antipsychotic agents: N/A 5. Neuropsych/cognition: This patient is capable of making decisions on his own behalf. 6. Skin/Wound Care: Routine skin checks 7. Fluids/Electrolytes/Nutrition: Routine in and outs with follow-up chemistries 8.  Atrial fibrillation/AICD.  Continue amiodarone 200 mg daily as well as Eliquis.  Cardiac rate controlled 9.  Diabetes mellitus.  Hemoglobin  A1c 9.9.  Semglee 32 units nightly.  SSI. Check blood sugars before meals and at bedtime CBG (last 3)  Recent Labs    06/05/22 1646 06/05/22 2118 06/06/22 0616  GLUCAP 98 140* 87   Reduce semglee 29U qhs- well controlled  12/16 reduce semglee to 27u  Monitor CBG trend today  10.  Hyperlipidemia.  Lipitor 40mg  daily 11.  Chronic systolic congestive heart failure.  Monitor for any signs of fluid overload. On Furosemide 20mg  daily.  Fluid intake poor will check BMET  12.  BPH.  Hytrin 5 mg twice daily- reduced to 1mg  BID due to soft BP 13.  Tobacco use.  Provide counseling 14.  Hypothyroidism.  Synthroid 15.  CKD.  Baseline creatinine 1.43-1.94.  Follow-up chemistries  -Recheck Monday ordered 16. HTN. Long term goal normotensive. Home medications metoprolol and cozaar currently on hold. Monitor with activity. Vitals:   06/05/22 1931 06/06/22 0453  BP: 116/84 116/72  Pulse: 81 72  Resp: 15 18  Temp: 98.2 F (36.8 C) 97.6 F (36.4 C)  SpO2: 93% 100%  May reduce terazosin 1mg  BID  BMET today  236ml .45 NS bolus on 12/9- ECHO showed EF 60-65%- ok BPs still soft but asymptomatic will change amitriptyline to nortriptyline 12/17 well controlled  17. Hypothyroidism. Continue synthroid   18.  Fever/AMS  improved + , UA + for e coli >100K sens to keflex , cont 500mg  TID x 7d total  CT head no acute changes CXR clear  Tachy improved likely with hydration status  12/16 HR  in 70s, improved, continue keflex  LOS: 11 days A FACE TO [place] 06/06/2022, 10:33 AM

## 2022-06-07 LAB — BASIC METABOLIC PANEL
Anion gap: 9 (ref 5–15)
BUN: 14 mg/dL (ref 8–23)
CO2: 22 mmol/L (ref 22–32)
Calcium: 8.7 mg/dL — ABNORMAL LOW (ref 8.9–10.3)
Chloride: 106 mmol/L (ref 98–111)
Creatinine, Ser: 1.31 mg/dL — ABNORMAL HIGH (ref 0.61–1.24)
GFR, Estimated: 56 mL/min — ABNORMAL LOW (ref >60)
Glucose, Bld: 112 mg/dL — ABNORMAL HIGH (ref 70–99)
Potassium: 4.2 mmol/L (ref 3.5–5.1)
Sodium: 137 mmol/L (ref 135–145)

## 2022-06-07 LAB — CBC
HCT: 39.7 % (ref 39.0–52.0)
Hemoglobin: 13.2 g/dL (ref 13.0–17.0)
MCH: 29.9 pg (ref 26.0–34.0)
MCHC: 33.2 g/dL (ref 30.0–36.0)
MCV: 90 fL (ref 80.0–100.0)
Platelets: 223 10*3/uL (ref 150–400)
RBC: 4.41 MIL/uL (ref 4.22–5.81)
RDW: 13.2 % (ref 11.5–15.5)
WBC: 5.9 10*3/uL (ref 4.0–10.5)
nRBC: 0 % (ref 0.0–0.2)

## 2022-06-07 LAB — GLUCOSE, CAPILLARY
Glucose-Capillary: 119 mg/dL — ABNORMAL HIGH (ref 70–99)
Glucose-Capillary: 133 mg/dL — ABNORMAL HIGH (ref 70–99)
Glucose-Capillary: 123 mg/dL — ABNORMAL HIGH (ref 70–99)
Glucose-Capillary: 96 mg/dL (ref 70–99)

## 2022-06-07 MED ORDER — ALUM & MAG HYDROXIDE-SIMETH 200-200-20 MG/5ML PO SUSP
30.0000 mL | ORAL | Status: DC | PRN
  Administered 2022-06-07 – 2022-06-10 (×5): 30 mL via ORAL
  Filled 2022-06-07 (×5): qty 30

## 2022-06-07 NOTE — Progress Notes (Signed)
Occupational Therapy Session Note  Patient Details  Name: Grant [name] MRN: [id number] Date of Birth: 08-May-1946  Today's Date: 06/07/2022 OT Individual Time: 1856-3149 OT Individual Time Calculation (min): 45 min    Short Term Goals: Week 2:  OT Short Term Goal 1 (Week 2): Pt will be able to hold static stand balance with no UE support with CGA or less to prep for LB dressing. OT Short Term Goal 2 (Week 2): Pt will be able pull pants over hips with min A using 1 hand support on grab bar or rail. OT Short Term Goal 3 (Week 2): Pt will demostrate improved L side coordination to don a overhead shirt with supervision.  Skilled Therapeutic Interventions/Progress Updates:    Pt received in bed agreeable to therapy, alert.  Donned knee high TED  hose on pt. Moved bed to flat position to have pt to practice getting out of bed as he will need to at home. He continues to need max cues to roll shoulders to enable him to push up from sidelying with min A.  Once sitting EOB cues to lean forward then pt was able to sit statically. Practiced sit to stand from EOB 3 x to RW with cues to use B hands to push up from bed and then reach R then L hand to walker. He continues to have severely impaired L side awareness which inhibits his coordination. Pt did push to stand with min A but then a all 3 x leans back on heels with toes raised and leaning against bed with mod to hold balance. Pt not following cues well to put weight on balls of feet, after 3rd try pt able to stand upright.  Asked NT to be present for +2 A if needed. Pt asked to stand up to RW and step to w/c.  With this transfer he did extremely well with only CGA!   From w/c  pt positioned at sink to cleanse and change brief and pants as there was a urine smell.  At sink, pt cued to stand up again but this time having great difficulty and leaning to L with L knee bent. Pt did not say anything but after sitting down, asked pt how he felt and said  dizzy.   Blood pressure assessed in sitting and standing a 2nd time but BP not low.  On 2nd stand, pt also leaned to left and did not seem aware at all with no reaction to his "falling left".  Pt sat down and stated he felt dizzy again but did not express during stand.  Pt felt fine after 1 minute rest.   Pt sitting in w/c with belt alarm on and all needs met.   Therapy Documentation Precautions:  Precautions Precautions: Fall Precaution Comments: LT wrist pain with WB (fall 2 months ago on hand. No imaging) Restrictions Weight Bearing Restrictions: No   Pain: no c/o pain    ADL: ADL Eating: Set up Grooming: Setup Upper Body Bathing: Supervision/safety, Minimal cueing (using wash mit on L hand) Where Assessed-Upper Body Bathing: Shower Lower Body Bathing: Minimal assistance (using long handled sponge) Where Assessed-Lower Body Bathing: Shower Upper Body Dressing: Minimal assistance, Moderate cueing Where Assessed-Upper Body Dressing: Wheelchair Lower Body Dressing: Moderate assistance, Moderate cueing Where Assessed-Lower Body Dressing: Wheelchair Toileting: Maximal assistance Where Assessed-Toileting: Glass blower/designer: Moderate assistance Toilet Transfer Method: Stand pivot    Therapy/Group: Individual Therapy  Elizabeth Paulsen 06/07/2022, 8:31 AM

## 2022-06-07 NOTE — Progress Notes (Signed)
PROGRESS NOTE   Subjective/Complaints:   ROS- neg CP, SOB, N/V/D, abdominal pain, HA + pins and needles pain  Objective:   No results found. Recent Labs    06/07/22 0711  WBC 5.9  HGB 13.2  HCT 39.7  PLT 223    No results for input(s): "NA", "K", "CL", "CO2", "GLUCOSE", "BUN", "CREATININE", "CALCIUM" in the last 72 hours.   Intake/Output Summary (Last 24 hours) at 06/07/2022 0801 Last data filed at 06/07/2022 0116 Gross per 24 hour  Intake 670 ml  Output 1350 ml  Net -680 ml         Physical Exam: Vital Signs Blood pressure 127/78, pulse 72, temperature 98.2 F (36.8 C), resp. rate 16, height 6\' 2"  (1.88 m), SpO2 94 %.   General: No acute distress, seen at bedside Mood and affect are appropriate, pleasant Heart: Regular rate and rhythm no rubs murmurs or extra sounds Lungs: Clear to auscultation, breathing unlabored, occ bibasilar wheezes, no increased WOB Abdomen: Positive bowel sounds, soft nontender to palpation, nondistended Extremities: No clubbing, cyanosis, or edema Skin: No evidence of breakdown, no evidence of rash, warm and dry Neurologic: Cranial nerves II through XII intact, motor strength is 4/5 in bilateral deltoid, bicep, tricep, grip, hip flexor, knee extensors, ankle dorsiflexor and plantar flexor Sensory exam reduced  sensation to light touch  in right upper and left , has intact proprioception LLE Cerebellar exam normal finger to nose to finger as well as heel to shin in right  upper and lower extremities Dysmetria LUE and LLE Musculoskeletal: right hand intrinsic atrophy    Assessment/Plan: 1. Functional deficits which require 3+ hours per day of interdisciplinary therapy in a comprehensive inpatient rehab setting. Physiatrist is providing close team supervision and 24 hour management of active medical problems listed below. Physiatrist and rehab team continue to assess barriers to  discharge/monitor patient progress toward functional and medical goals  Care Tool:  Bathing    Body parts bathed by patient: Chest, Abdomen, Right upper leg, Left upper leg, Buttocks, Face, Left arm   Body parts bathed by helper: Front perineal area, Left lower leg, Right lower leg, Right arm     Bathing assist Assist Level: Moderate Assistance - Patient 50 - 74%     Upper Body Dressing/Undressing Upper body dressing   What is the patient wearing?: Pull over shirt    Upper body assist Assist Level: Moderate Assistance - Patient 50 - 74%    Lower Body Dressing/Undressing Lower body dressing      What is the patient wearing?: Underwear/pull up, Pants     Lower body assist Assist for lower body dressing: Maximal Assistance - Patient 25 - 49%     Toileting Toileting    Toileting assist Assist for toileting: Maximal Assistance - Patient 25 - 49%     Transfers Chair/bed transfer  Transfers assist     Chair/bed transfer assist level: Moderate Assistance - Patient 50 - 74%     Locomotion Ambulation   Ambulation assist      Assist level: Minimal Assistance - Patient > 75% Assistive device: Walker-rolling (required assitance w/ maintaing left hand on walker and RW navigation) Max  distance: 67ft   Walk 10 feet activity   Assist     Assist level: Minimal Assistance - Patient > 75% (required assitance w/ maintaing left hand on walker and RW navigation) Assistive device: Walker-rolling   Walk 50 feet activity   Assist    Assist level: Minimal Assistance - Patient > 75% (required assitance w/ maintaing left hand on walker and RW navigation) Assistive device: Walker-rolling    Walk 150 feet activity   Assist Walk 150 feet activity did not occur: Safety/medical concerns         Walk 10 feet on uneven surface  activity   Assist Walk 10 feet on uneven surfaces activity did not occur: Safety/medical concerns         Wheelchair     Assist  Is the patient using a wheelchair?: Yes Type of Wheelchair: Manual    Wheelchair assist level: Moderate Assistance - Patient 50 - 74% Max wheelchair distance: 49ft (Using RUE & RLE)    Wheelchair 50 feet with 2 turns activity    Assist        Assist Level: Moderate Assistance - Patient 50 - 74%   Wheelchair 150 feet activity     Assist      Assist Level: Maximal Assistance - Patient 25 - 49%   Blood pressure 127/78, pulse 72, temperature 98.2 F (36.8 C), resp. rate 16, height 6\' 2"  (1.88 m), SpO2 94 %.  Medical Problem List and Plan: 1. Functional deficits secondary to subacute right MCA ischemic infarction             -patient may  shower             -ELOS/Goals:  12/27, family hoping to d/c prior to 12/25    -Continue CIR PT/OT/SLP  2.  Antithrombotics: -DVT/anticoagulation:  Pharmaceutical: Eliquis             -antiplatelet therapy: Aspirin 81 mg daily 3. Pain Management: Neurontin 300 mg 3 times daily, Elavil 75mg  nightly, Flexeril 10 mg nightly as needed             off gabapentin due to drowsiness no worsening of pain 12/15  -Start low dose lyrica 25mg  BID for neuropathic pain 4. Mood/Behavior/Sleep: Lexapro 10 mg nightly, Elavil 75 mg nightly.  Provide emotional support             -antipsychotic agents: N/A 5. Neuropsych/cognition: This patient is capable of making decisions on his own behalf. 6. Skin/Wound Care: Routine skin checks 7. Fluids/Electrolytes/Nutrition: Routine in and outs with follow-up chemistries 8.  Atrial fibrillation/AICD.  Continue amiodarone 200 mg daily as well as Eliquis.  Cardiac rate controlled 9.  Diabetes mellitus.  Hemoglobin A1c 9.9.  Semglee 32 units nightly.  SSI. Check blood sugars before meals and at bedtime CBG (last 3)  Recent Labs    06/06/22 1646 06/06/22 2043 06/07/22 0533  GLUCAP 96 74 119*   Reduce semglee 29U qhs- well controlled  12/16 reduce semglee to 27u  Monitor CBG trend today  10.   Hyperlipidemia.  Lipitor 40mg  daily 11.  Chronic systolic congestive heart failure.  Monitor for any signs of fluid overload. On Furosemide 20mg  daily.  Fluid intake poor will check BMET  12.  BPH.  Hytrin 5 mg twice daily- reduced to 1mg  BID due to soft BP 13.  Tobacco use.  Provide counseling 14.  Hypothyroidism.  Synthroid 15.  CKD.  Baseline creatinine 1.43-1.94.  Follow-up chemistries  -Recheck Monday ordered 16. HTN.  Long term goal normotensive. Home medications metoprolol and cozaar currently on hold. Monitor with activity. Vitals:   06/06/22 1930 06/07/22 0406  BP: 122/82 127/78  Pulse: 73 72  Resp: 16 16  Temp: 98.4 F (36.9 C) 98.2 F (36.8 C)  SpO2: 96% 94%  May reduce terazosin 1mg  BID  BMET today  .45 NS bolus on 12/9- ECHO showed EF 60-65%- ok BPs still soft but asymptomatic will change amitriptyline to nortriptyline 12/18 well controlled  17. Hypothyroidism. Continue synthroid   18.  E coli UTI, completed keflex  LOS: 12 days A FACE TO FACE EVALUATION WAS PERFORMED  1/19 06/07/2022, 8:01 AM

## 2022-06-07 NOTE — Progress Notes (Signed)
Occupational Therapy Session Note  Patient Details  Name: Grant [name] MRN: [id number] Date of Birth: Apr 11, 1946  Today's Date: 06/07/2022 OT Individual Time: 9798-9211 OT Individual Time Calculation (min): 43 min    Short Term Goals: Week 2:  OT Short Term Goal 1 (Week 2): Pt will be able to hold static stand balance with no UE support with CGA or less to prep for LB dressing. OT Short Term Goal 2 (Week 2): Pt will be able pull pants over hips with min A using 1 hand support on grab bar or rail. OT Short Term Goal 3 (Week 2): Pt will demostrate improved L side coordination to don a overhead shirt with supervision.  Skilled Therapeutic Interventions/Progress Updates:  Pt greeted seated in w/c, pt agreeable to OT intervention. Session focus on L sided attention, LUE coordination, standing balance and functional mobility.   Total A transport to gym where pt completed dynamic reaching in standing to remove squigz from mirror with LUE. Pt needed MIN A to stand with Rw with cues for hand placement and heavy posterior bias with BLEs pushing into back of w/c. Pt needed MINA for balance d/t L lean. Pt with poor L sided attention often trying to use RUE to remove squigz. Pt also with impaired motor planning having difficulty problem solving how to get digits around squigz. Pt with difficulty stepping back to w/c in standing with difficulty getting LUE back in proper position on RW. Pt with unsteadiness needing MOD A to lower to w/c as pt reports feeling dizzy.   BP sitting- 104/ 74 ( 84)HR 86            Pt completed seated targeted reaching with LUE to facilitate improved LUE motor planning and proprioception, pt needed MIN hand over hand for motor planning and MIN cues needed to problem solve hand placement on squigz.       BP after sitting to complete task 107/78 ( 89) HR 84       Ended session with pt supine in bed with all needs within reach and bed alarm activated.                     Therapy Documentation Precautions:  Precautions Precautions: Fall Precaution Comments: LT wrist pain with WB (fall 2 months ago on hand. No imaging) Restrictions Weight Bearing Restrictions: No    Pain: No pain      Therapy/Group: Individual Therapy  Barron Schmid 06/07/2022, 12:23 PM

## 2022-06-07 NOTE — Progress Notes (Signed)
Physical Therapy Session Note  Patient Details  Name: Nolberto Cheuvront MRN: [id number] Date of Birth: 1945/07/31  Today's Date: 06/07/2022 PT Individual Time: 8338-2505 PT Individual Time Calculation (min): 61 min   Short Term Goals: Week 1:  PT Short Term Goal 1 (Week 1): Pt wil perfrom bed/chair transfers w/ supervison assist using LRAD PT Short Term Goal 1 - Progress (Week 1): Not met PT Short Term Goal 2 (Week 1): Pt ambulate 75 ft w/ supervsion assist using LRAD PT Short Term Goal 2 - Progress (Week 1): Not met PT Short Term Goal 3 (Week 1): Pt will naviagte 4 6 inch steps w/ min A while using hand rails PT Short Term Goal 3 - Progress (Week 1): Not met PT Short Term Goal 4 (Week 1): Pt wil perfrom  functional otucome measure to predict fall risk PT Short Term Goal 4 - Progress (Week 1): Met PT Short Term Goal 5 (Week 1): Pt will perfrom bed mobility at supervison level conistently PT Short Term Goal 5 - Progress (Week 1): Not met Week 2:  PT Short Term Goal 1 (Week 2): Pt will trasnfer to [facility] with CGA consistently PT Short Term Goal 2 (Week 2): Pt will ambulate 83f with CGA and no LOB PT Short Term Goal 3 (Week 2): Pt will tolerate standing >3 mintues to imrpove safety with ADLS and CGA  Skilled Therapeutic Interventions/Progress Updates:  Patient supine in bed on entrance to room and watching TV. Patient alert and reports being comfortable in bed, but agreeable to PT session.   Patient with no pain complaint at start of session.  Therapeutic Activity: Bed Mobility: Pt performed supine --> sit with supervision and steady balance while seated EOB. At end of session, pt is able to perform stand pivot with use of RUE on bedrail. VC/ tc required for initiating and sequencing return to supine. Performed with supervision once initiated. Requires cues to position to more neutral positioning in bed.  Transfers: Pt performed sit<>stand and stand pivot transfers throughout session with  MinA for balance. Provided verbal cues for technique and increasing forward lean throughout session. Walker placed more anterior to pt with slight improvement in balance attainment. .  Gait Training/ NMR:  TUG completed with average of 134m 21 sec. (A score of >13.5 seconds indicates patient is at a high fall risk. Pt educated on interpretation of their score). Pt demos difficulty in balance especially in turns and requires up to Min/ ModA to maintain balance. Uses RW for each trial.   Stair training initiated with pt self selecting to ascend with reciprocating step pattern despite education to step up with RLE first. MinA to complete ascent with BHR. Then demos fatigue and requires heavy ModA with Bil knee blocks and vc for strong legs throughout in order to descend. Is able to follow instructions for leading with LLE and to clear foot from each step.   Neuromuscular Re-ed: NMR facilitated during session with focus on standing balance. Pt guided in sit <> stand blocked practice with focus on forward push with BUE at armrests. Forward propulsion facilitated from therapist throughout with improved balance upon standing. NMR performed for improvements in motor control and coordination, balance, sequencing, judgement, and self confidence/ efficacy in performing all aspects of mobility at highest level of independence.   Patient supine in bed at end of session with brakes locked, bed alarm set, and all needs within reach.   Therapy Documentation Precautions:  Precautions Precautions: Fall Precaution Comments: LT  wrist pain with WB (fall 2 months ago on hand. No imaging) Restrictions Weight Bearing Restrictions: No General:   Vital Signs:   Pain:  No pain related this session.    Therapy/Group: Individual Therapy  [name] PT, DPT, CSRS 06/07/2022, 6:24 PM

## 2022-06-07 NOTE — Progress Notes (Addendum)
Patient ID: Grant [name], male   DOB: 1945-10-30, 76 y.o.   MRN: [id number]  Sw requesting shower chair, rolling walker, wheelchair and bedside commode through [place]. [facility] referral sent to [facility] for FU PT/OT/SLP.   3:30PM: Service request form, orders and clinical information submitted to [facility] for DME and HH FU

## 2022-06-08 LAB — GLUCOSE, CAPILLARY
Glucose-Capillary: 96 mg/dL (ref 70–99)
Glucose-Capillary: 131 mg/dL — ABNORMAL HIGH (ref 70–99)
Glucose-Capillary: 110 mg/dL — ABNORMAL HIGH (ref 70–99)
Glucose-Capillary: 122 mg/dL — ABNORMAL HIGH (ref 70–99)

## 2022-06-08 MED ORDER — INSULIN GLARGINE-YFGN 100 UNIT/ML ~~LOC~~ SOLN
25.0000 [IU] | Freq: Every day | SUBCUTANEOUS | Status: DC
  Administered 2022-06-08: 25 [IU] via SUBCUTANEOUS
  Filled 2022-06-08 (×2): qty 0.25

## 2022-06-08 NOTE — Plan of Care (Signed)
Goals downgraded due to slower than anticipated progress Problem: RH Problem Solving Goal: LTG Patient will demonstrate problem solving for (SLP) Description: LTG:  Patient will demonstrate problem solving for basic/complex daily situations with cues  (SLP) Flowsheets (Taken 06/08/2022 1731) LTG Patient will demonstrate problem solving for:  Minimal Assistance - Patient > 75%  Moderate Assistance - Patient 50 - 74%   Problem: RH Memory Goal: LTG Patient will demonstrate ability for day to day (SLP) Description: LTG:   Patient will demonstrate ability for day to day recall/carryover during cognitive/linguistic activities with assist  (SLP) Flowsheets (Taken 06/08/2022 1731) LTG: Patient will demonstrate ability for day to day recall/carryover during cognitive/linguistic activities with assist (SLP):  Minimal Assistance - Patient > 75%  Moderate Assistance - Patient 50 - 74% Goal: LTG Patient will use memory compensatory aids to (SLP) Description: LTG:  Patient will use memory compensatory aids to recall biographical/new, daily complex information with cues (SLP) Flowsheets (Taken 06/08/2022 1731) LTG: Patient will use memory compensatory aids to (SLP):  Minimal Assistance - Patient > 75%  Moderate Assistance - Patient 50 - 74%   Problem: RH Awareness Goal: LTG: Patient will demonstrate awareness during functional activites type of (SLP) Description: LTG: Patient will demonstrate awareness during functional activites type of (SLP) Flowsheets (Taken 06/08/2022 1731) LTG: Patient will demonstrate awareness during cognitive/linguistic activities with assistance of (SLP): Moderate Assistance - Patient 50 - 74% Note: Min-to-mod A

## 2022-06-08 NOTE — Progress Notes (Signed)
PROGRESS NOTE   Subjective/Complaints:  Awake and alert, eats with supervision , feels "ready to go home"  ROS- neg CP, SOB, N/V/D, abdominal pain, HA   Objective:   No results found. Recent Labs    06/07/22 0711  WBC 5.9  HGB 13.2  HCT 39.7  PLT 223    Recent Labs    06/07/22 0711  NA 137  K 4.2  CL 106  CO2 22  GLUCOSE 112*  BUN 14  CREATININE 1.31*  CALCIUM 8.7*     Intake/Output Summary (Last 24 hours) at 06/08/2022 0754 Last data filed at 06/08/2022 0002 Gross per 24 hour  Intake 360 ml  Output 740 ml  Net -380 ml         Physical Exam: Vital Signs Blood pressure 119/78, pulse 70, temperature 98.2 F (36.8 C), resp. rate 16, height 6\' 2"  (1.88 m), SpO2 96 %.   General: No acute distress, seen at bedside Mood and affect are appropriate, pleasant Heart: Regular rate and rhythm no rubs murmurs or extra sounds Lungs: Clear to auscultation, breathing unlabored, occ bibasilar wheezes, no increased WOB Abdomen: Positive bowel sounds, soft nontender to palpation, nondistended Extremities: No clubbing, cyanosis, or edema Skin: No evidence of breakdown, no evidence of rash, warm and dry Neurologic: Cranial nerves II through XII intact, motor strength is 4/5 in bilateral deltoid, bicep, tricep, grip, hip flexor, knee extensors, ankle dorsiflexor and plantar flexor Sensory exam reduced  sensation to light touch  in right upper and left , has intact proprioception LLE Cerebellar exam normal finger to nose to finger as well as heel to shin in right  upper and lower extremities Dysmetria LUE and LLE Musculoskeletal: right hand intrinsic atrophy    Assessment/Plan: 1. Functional deficits which require 3+ hours per day of interdisciplinary therapy in a comprehensive inpatient rehab setting. Physiatrist is providing close team supervision and 24 hour management of active medical problems listed  below. Physiatrist and rehab team continue to assess barriers to discharge/monitor patient progress toward functional and medical goals  Care Tool:  Bathing    Body parts bathed by patient: Chest, Abdomen, Right upper leg, Left upper leg, Buttocks, Face, Left arm   Body parts bathed by helper: Front perineal area, Left lower leg, Right lower leg, Right arm     Bathing assist Assist Level: Moderate Assistance - Patient 50 - 74%     Upper Body Dressing/Undressing Upper body dressing   What is the patient wearing?: Pull over shirt    Upper body assist Assist Level: Moderate Assistance - Patient 50 - 74%    Lower Body Dressing/Undressing Lower body dressing      What is the patient wearing?: Underwear/pull up, Pants     Lower body assist Assist for lower body dressing: Maximal Assistance - Patient 25 - 49%     Toileting Toileting    Toileting assist Assist for toileting: Maximal Assistance - Patient 25 - 49%     Transfers Chair/bed transfer  Transfers assist     Chair/bed transfer assist level: Moderate Assistance - Patient 50 - 74%     Locomotion Ambulation   Ambulation assist  Assist level: Minimal Assistance - Patient > 75% Assistive device: Walker-rolling (required assitance w/ maintaing left hand on walker and RW navigation) Max distance: 33ft   Walk 10 feet activity   Assist     Assist level: Minimal Assistance - Patient > 75% (required assitance w/ maintaing left hand on walker and RW navigation) Assistive device: Walker-rolling   Walk 50 feet activity   Assist    Assist level: Minimal Assistance - Patient > 75% (required assitance w/ maintaing left hand on walker and RW navigation) Assistive device: Walker-rolling    Walk 150 feet activity   Assist Walk 150 feet activity did not occur: Safety/medical concerns         Walk 10 feet on uneven surface  activity   Assist Walk 10 feet on uneven surfaces activity did not occur:  Safety/medical concerns         Wheelchair     Assist Is the patient using a wheelchair?: Yes Type of Wheelchair: Manual    Wheelchair assist level: Moderate Assistance - Patient 50 - 74% Max wheelchair distance: 51ft (Using RUE & RLE)    Wheelchair 50 feet with 2 turns activity    Assist        Assist Level: Moderate Assistance - Patient 50 - 74%   Wheelchair 150 feet activity     Assist      Assist Level: Maximal Assistance - Patient 25 - 49%   Blood pressure 119/78, pulse 70, temperature 98.2 F (36.8 C), resp. rate 16, height 6\' 2"  (1.88 m), SpO2 96 %.  Medical Problem List and Plan: 1. Functional deficits secondary to subacute right MCA ischemic infarction             -patient may  shower             -ELOS/Goals:  12/27, family hoping to d/c prior to 12/25    -Continue CIR PT/OT/SLP  2.  Antithrombotics: -DVT/anticoagulation:  Pharmaceutical: Eliquis             -antiplatelet therapy: Aspirin 81 mg daily 3. Pain Management: Neurontin 300 mg 3 times daily, Elavil 75mg  nightly, Flexeril 10 mg nightly as needed             off gabapentin due to drowsiness no worsening of pain 12/15  -Start low dose lyrica 25mg  BID for neuropathic pain 4. Mood/Behavior/Sleep: Lexapro 10 mg nightly, Elavil 75 mg nightly.  Provide emotional support             -antipsychotic agents: N/A 5. Neuropsych/cognition: This patient is capable of making decisions on his own behalf. 6. Skin/Wound Care: Routine skin checks 7. Fluids/Electrolytes/Nutrition: Routine in and outs with follow-up chemistries 8.  Atrial fibrillation/AICD.  Continue amiodarone 200 mg daily as well as Eliquis.  Cardiac rate controlled 9.  Diabetes mellitus.  Hemoglobin A1c 9.9.  Semglee 32 units nightly.  SSI. Check blood sugars before meals and at bedtime CBG (last 3)  Recent Labs    06/07/22 1704 06/07/22 2044 06/08/22 0608  GLUCAP 123* 96 96   Reduce semglee 29U qhs- well controlled  12/16  reduce semglee to 27u  12/19 reduce Semglee to 25U  10.  Hyperlipidemia.  Lipitor 40mg  daily 11.  Chronic systolic congestive heart failure.  Monitor for any signs of fluid overload. On Furosemide 20mg  daily.  Fluid intake poor will check BMET  12.  BPH.  Hytrin 5 mg twice daily- reduced to 1mg  BID due to soft BP 13.  Tobacco use.  Provide counseling 14.  Hypothyroidism.  Synthroid 15.  CKD.  Baseline creatinine 1.43-1.94.  Follow-up chemistries  -Recheck Monday ordered 16. HTN. Long term goal normotensive. Home medications metoprolol and cozaar currently on hold. Monitor with activity. Vitals:   06/07/22 2042 06/08/22 0438  BP: 119/86 119/78  Pulse: 75 70  Resp: 17 16  Temp: (!) 97.5 F (36.4 C) 98.2 F (36.8 C)  SpO2: 95% 96%  May reduce terazosin 1mg  BID  BMET today  .45 NS bolus on 12/9- ECHO showed EF 60-65%- ok BPs still soft but asymptomatic will change amitriptyline to nortriptyline 12/18 well controlled  17. Hypothyroidism. Continue synthroid   18.  E coli UTI, completed keflex  LOS: 13 days A FACE TO FACE EVALUATION WAS PERFORMED  1/19 06/08/2022, 7:54 AM

## 2022-06-08 NOTE — Progress Notes (Signed)
Physical Therapy Session Note  Patient Details  Name: Colyn Miron MRN: [id number] Date of Birth: 1945-08-25  Today's Date: 06/08/2022 PT Individual Time: 1031-1130 PT Individual Time Calculation (min): 59 min   Short Term Goals: Week 1:  PT Short Term Goal 1 (Week 1): Pt wil perfrom bed/chair transfers w/ supervison assist using LRAD PT Short Term Goal 1 - Progress (Week 1): Not met PT Short Term Goal 2 (Week 1): Pt ambulate 75 ft w/ supervsion assist using LRAD PT Short Term Goal 2 - Progress (Week 1): Not met PT Short Term Goal 3 (Week 1): Pt will naviagte 4 6 inch steps w/ min A while using hand rails PT Short Term Goal 3 - Progress (Week 1): Not met PT Short Term Goal 4 (Week 1): Pt wil perfrom  functional otucome measure to predict fall risk PT Short Term Goal 4 - Progress (Week 1): Met PT Short Term Goal 5 (Week 1): Pt will perfrom bed mobility at supervison level conistently PT Short Term Goal 5 - Progress (Week 1): Not met Week 2:  PT Short Term Goal 1 (Week 2): Pt will trasnfer to [facility] with CGA consistently PT Short Term Goal 2 (Week 2): Pt will ambulate 26f with CGA and no LOB PT Short Term Goal 3 (Week 2): Pt will tolerate standing >3 mintues to imrpove safety with ADLS and CGA Week 3:     Skilled Therapeutic Interventions/Progress Updates:      Therapy Documentation Precautions:  Precautions Precautions: Fall Precaution Comments: LT wrist pain with WB (fall 2 months ago on hand. No imaging) Restrictions Weight Bearing Restrictions: No General:   Vital Signs: Therapy Vitals Pulse Rate: 80 Resp: 16 BP: 118/89 Patient Position (if appropriate): Sitting Oxygen Therapy SpO2: 97 % O2 Device: Room Air Pain:   Mobility:   Locomotion :    Trunk/Postural Assessment :    Balance:   Exercises:   Other Treatments:      Therapy/Group: Individual Therapy  JAlger SimonsPT, DPT, CSRS 06/08/2022, 6:33 PM

## 2022-06-08 NOTE — Progress Notes (Signed)
Physical Therapy Session Note  Patient Details  Name: Grant [name] MRN: [id number] Date of Birth: 1946/05/27  Today's Date: 06/08/2022 PT Individual Time: 1455-1520 PT Individual Time Calculation (min): 25 min   Short Term Goals: Week 1:  PT Short Term Goal 1 (Week 1): Pt wil perfrom bed/chair transfers w/ supervison assist using LRAD PT Short Term Goal 1 - Progress (Week 1): Not met PT Short Term Goal 2 (Week 1): Pt ambulate 75 ft w/ supervsion assist using LRAD PT Short Term Goal 2 - Progress (Week 1): Not met PT Short Term Goal 3 (Week 1): Pt will naviagte 4 6 inch steps w/ min A while using hand rails PT Short Term Goal 3 - Progress (Week 1): Not met PT Short Term Goal 4 (Week 1): Pt wil perfrom  functional otucome measure to predict fall risk PT Short Term Goal 4 - Progress (Week 1): Met PT Short Term Goal 5 (Week 1): Pt will perfrom bed mobility at supervison level conistently PT Short Term Goal 5 - Progress (Week 1): Not met Week 2:  PT Short Term Goal 1 (Week 2): Pt will trasnfer to [facility] with CGA consistently PT Short Term Goal 2 (Week 2): Pt will ambulate 35f with CGA and no LOB PT Short Term Goal 3 (Week 2): Pt will tolerate standing >3 mintues to imrpove safety with ADLS and CGA Week 3:     Skilled Therapeutic Interventions/Progress Updates:      Therapy Documentation Precautions:  Precautions Precautions: Fall Precaution Comments: LT wrist pain with WB (fall 2 months ago on hand. No imaging) Restrictions Weight Bearing Restrictions: No General:   Vital Signs: Therapy Vitals Pulse Rate: 80 Resp: 16 BP: 118/89 Patient Position (if appropriate): Sitting Oxygen Therapy SpO2: 97 % O2 Device: Room Air Pain:    Therapy/Group: Individual Therapy  JAlger SimonsPT, DPT, CSRS 06/08/2022, 6:34 PM

## 2022-06-08 NOTE — Progress Notes (Signed)
Speech Language Pathology Weekly Progress and Session Note  Patient Details  Name: Grant [name] MRN: [id number] Date of Birth: 04/18/1946  Beginning of progress report period: June 02, 2022 End of progress report period: June 08, 2022  Short Term Goals: Week 2: SLP Short Term Goal 1 (Week 2): Patient will recall and demonstrate use of memory compensations to increase independence and safety at home with min-to-modA verbal cues. SLP Short Term Goal 1 - Progress (Week 2): Met SLP Short Term Goal 2 (Week 2): Patient will demonstrate functional problem solving for basic and familiar tasks with Min-to-Mod A verbal cues. SLP Short Term Goal 2 - Progress (Week 2): Not met SLP Short Term Goal 3 (Week 2): Pt will demonstrate awareness of errors during functional tasks with min-to-mod A verbal cues SLP Short Term Goal 3 - Progress (Week 2): Not met SLP Short Term Goal 4 (Week 2): Pt will demonstrate selective attention by completing cognitive tasks within a mildly distracting environment with min-to-mod A verbal redirection cues. SLP Short Term Goal 4 - Progress (Week 2): Met  New Short Term Goals: Week 3: SLP Short Term Goal 1 (Week 3): STG=LTG due to ELOS  Weekly Progress Updates: Patient has made average gains and has met 2 of 4 STGs this reporting period. Patient is currently completing cognitive tasks with overall mod A verbal and visual cues to complete functional and basic-to-mildly complex tasks accurately and safely in regards to attention, problem solving, awareness, and memory. LTGs were downgraded due to slower than anticipated progress. Patient and family education is ongoing. Continue to recommend ST intervention during CIR admission, as well as ST f/u upon d/c. Recommend 24/7 supervision and assistance at time of discharge.     Intensity: Minumum of 1-2 x/day, 30 to 90 minutes Frequency: 3 to 5 out of 7 days Duration/Length of Stay: 12/27 Treatment/Interventions:  Cognitive remediation/compensation;Functional tasks;Internal/external aids;Patient/family education;Therapeutic Activities  Therapy/Group: Individual Therapy  [name] [name] 06/08/2022, 5:30 PM

## 2022-06-08 NOTE — Progress Notes (Signed)
Speech Language Pathology Daily Session Note  Patient Details  Name: Grant [name] MRN: [id number] Date of Birth: 12-24-1945  Today's Date: 06/08/2022 SLP Individual Time: 1345-1445 SLP Individual Time Calculation (min): 60 min  Short Term Goals: Week 2: SLP Short Term Goal 1 (Week 2): Patient will recall and demonstrate use of memory compensations to increase independence and safety at home with min-to-modA verbal cues. SLP Short Term Goal 2 (Week 2): Patient will demonstrate functional problem solving for basic and familiar tasks with Min-to-Mod A verbal cues. SLP Short Term Goal 3 (Week 2): Pt will demonstrate awareness of errors during functional tasks with min-to-mod A verbal cues SLP Short Term Goal 4 (Week 2): Pt will demonstrate selective attention by completing cognitive tasks within a mildly distracting environment with min-to-mod A verbal redirection cues.  Skilled Therapeutic Interventions: Skilled ST treatment focused on cognitive goals. Pt was greeted upright in TIS w/c and complained of sleepiness although this did not appear to impact participation in session. SLP facilitated BID pillbox organization with overall mod-to-max A for  medication label comprehension, understanding orientation of pillbox (despite pt stating he utilized BID pillbox at prior level), problem solving, organization, and error awareness. Pt required mod A to utilize external aid for recall. Due to UE weakness, pt dropped a substantial amount of pills on the ground throughout task. Pt stated his spouse would be able to assist with managing medications at discharge and he would prefer for her to do so d/t his impaired yet improving fine motor skills. Pt demonstrated selective attention by completing today's tasks within a mildly distracting environment with sup A verbal redirection cues. Patient was left in w/c with alarm activated and immediate needs within reach at end of session. Continue per current plan  of care.     Pain  None/denied  Therapy/Group: Individual Therapy  Tamala Ser 06/08/2022, 1:57 PM

## 2022-06-08 NOTE — Progress Notes (Addendum)
Occupational Therapy Session Note  Patient Details  Name: Grant [name] MRN: [id number] Date of Birth: 23-Apr-1946  Today's Date: 06/08/2022 OT Individual Time: 9373-4287 OT Individual Time Calculation (min): 66 min   Short Term Goals: Week 2:  OT Short Term Goal 1 (Week 2): Pt will be able to hold static stand balance with no UE support with CGA or less to prep for LB dressing. OT Short Term Goal 2 (Week 2): Pt will be able pull pants over hips with min A using 1 hand support on grab bar or rail. OT Short Term Goal 3 (Week 2): Pt will demostrate improved L side coordination to don a overhead shirt with supervision.  Skilled Therapeutic Interventions/Progress Updates:  Pt received seated in bed for skilled OT session with focus on ADL retraining. Pt agreeable to interventions, demonstrating overall pleasant mood. Pt with no reports of pain, stating "not yet." OT offering intermediate rest breaks and positioning suggestions throughout session to address potential pain/fatigue and maximize participation/safety in session.   Pt greeted at the start of his breakfast, time dedicated for pt to complete meal with full supervision due to tremors. Reassessed throughout meal with pt stating "they aren't as bas as they used to be" in conversation about self-feeding. Therapist and nurse agree to place pt as supervision following observation of meals and pt's subjective input.  Pt comes to EOB from seated position with increased difficulty requiring cuing for motor planing of movements and postural control. Pt with reports of dizziness upon sitting EOB, vitals taken: -BP: 130/85 -HR: 75 -O2: 100  Pt with bowel urgency, requiring use of stedy for toilet transfer. Pt demonstrating strong L lean standing on stedy to doff/don brief/pants. Pt continent of BM, but brief soiled. Pt performs peri-care with forward lean. Pt requires mod-max for threading/donning of brief/pants demonstrating improved  integration of L-hand into dressing activity. Pt dons scrub top with min-mod A and education on hemi-dressing techniques. Pt performing multiple STS with use of stedy + CGA-Min A to complete above tasks.   Seated in WC, pt complains of dizziness post activity, vitals taken: -BP: 108/83 -HR: 85 -O2:100  Pt remained seated in [facility] with nursing staff present, all immediate needs met, and lap belt alarmed at end of session. Pt continues to be appropriate for skilled OT intervention to promote further functional independence.   Therapy Documentation Precautions:  Precautions Precautions: Fall Precaution Comments: LT wrist pain with WB (fall 2 months ago on hand. No imaging) Restrictions Weight Bearing Restrictions: No    Therapy/Group: Individual Therapy  Maudie Mercury, OTR/L, MSOT  06/08/2022, 6:41 AM

## 2022-06-08 NOTE — Progress Notes (Signed)
Occupational Therapy Session Note  Patient Details  Name: Grant [name] MRN: [id number] Date of Birth: 07-25-1945  Today's Date: 06/08/2022 OT Individual Time: 0930-1015 OT Individual Time Calculation (min): 45 min    Short Term Goals: Week 2:  OT Short Term Goal 1 (Week 2): Pt will be able to hold static stand balance with no UE support with CGA or less to prep for LB dressing. OT Short Term Goal 2 (Week 2): Pt will be able pull pants over hips with min A using 1 hand support on grab bar or rail. OT Short Term Goal 3 (Week 2): Pt will demostrate improved L side coordination to don a overhead shirt with supervision.  Skilled Therapeutic Interventions/Progress Updates:    Pt resting in w/c upon arrival. RN Alcario Drought reported that pt continues to slide out of w/c, especially when he falls asleep. TIS w/c located and adjusted for pt. Sit<>stand in [place] X 4 to facilitate transfer to TIS w/c. Pt reports "new" w/c more comfortable. Pt remained in TIS with belt alarm activated. All needs within reach.   Therapy Documentation Precautions:  Precautions Precautions: Fall Precaution Comments: LT wrist pain with WB (fall 2 months ago on hand. No imaging) Restrictions Weight Bearing Restrictions: No Pain:  Pt denies pain this morning   Therapy/Group: Individual Therapy  Rich Brave 06/08/2022, 10:24 AM

## 2022-06-09 DIAGNOSIS — I63511 Cerebral infarction due to unspecified occlusion or stenosis of right middle cerebral artery: Secondary | ICD-10-CM

## 2022-06-09 LAB — GLUCOSE, CAPILLARY
Glucose-Capillary: 72 mg/dL (ref 70–99)
Glucose-Capillary: 114 mg/dL — ABNORMAL HIGH (ref 70–99)
Glucose-Capillary: 99 mg/dL (ref 70–99)
Glucose-Capillary: 134 mg/dL — ABNORMAL HIGH (ref 70–99)

## 2022-06-09 MED ORDER — INSULIN GLARGINE-YFGN 100 UNIT/ML ~~LOC~~ SOLN
22.0000 [IU] | Freq: Every day | SUBCUTANEOUS | Status: DC
  Administered 2022-06-09 – 2022-06-10 (×2): 22 [IU] via SUBCUTANEOUS
  Filled 2022-06-09 (×3): qty 0.22

## 2022-06-09 MED ORDER — NORTRIPTYLINE HCL 25 MG PO CAPS
50.0000 mg | ORAL_CAPSULE | Freq: Every day | ORAL | Status: DC
  Administered 2022-06-09 – 2022-06-15 (×7): 50 mg via ORAL
  Filled 2022-06-09 (×8): qty 2

## 2022-06-09 NOTE — Progress Notes (Signed)
Patient ID: Grant [name], male   DOB: 03/09/1946, 76 y.o.   MRN: [id number]  Team Conference Report to Patient/Family  Team Conference discussion was reviewed with the patient and caregiver, including goals, any changes in plan of care and target discharge date.  Patient and caregiver express understanding and are in agreement.  The patient has a target discharge date of 06/16/22.  Sw spoke with pt spouse and provided conference updates. Sw expressed the recommendation for ST SNF. Spouse unsure at moment but will review facilities [facility] to make determination. Sw will information [facility] of [facility] potentially need and send clinical information. Sw will follow up with spouse on determination. Spouse plans to still attend family education tomorrow. No additional questions or concerns.  Andria Rhein 06/09/2022, 1:18 PM

## 2022-06-09 NOTE — Progress Notes (Signed)
Physical Therapy Session Note  Patient Details  Name: Grant [name] MRN: [id number] Date of Birth: [birth year]  Today's Date: 06/09/2022 PT Individual Time: 1332-1430 PT Individual Time Calculation (min): 58 min   Short Term Goals: Week 1:  PT Short Term Goal 1 (Week 1): Pt wil perfrom bed/chair transfers w/ supervison assist using LRAD PT Short Term Goal 1 - Progress (Week 1): Not met PT Short Term Goal 2 (Week 1): Pt ambulate 75 ft w/ supervsion assist using LRAD PT Short Term Goal 2 - Progress (Week 1): Not met PT Short Term Goal 3 (Week 1): Pt will naviagte 4 6 inch steps w/ min A while using hand rails PT Short Term Goal 3 - Progress (Week 1): Not met PT Short Term Goal 4 (Week 1): Pt wil perfrom  functional otucome measure to predict fall risk PT Short Term Goal 4 - Progress (Week 1): Met PT Short Term Goal 5 (Week 1): Pt will perfrom bed mobility at supervison level conistently PT Short Term Goal 5 - Progress (Week 1): Not met Week 2:  PT Short Term Goal 1 (Week 2): Pt will trasnfer to [facility] with CGA consistently PT Short Term Goal 2 (Week 2): Pt will ambulate 37f with CGA and no LOB PT Short Term Goal 3 (Week 2): Pt will tolerate standing >3 mintues to imrpove safety with ADLS and CGA Week 3:    Week 4:     Skilled Therapeutic Interventions/Progress Updates:   Pt received sitting in WC and agreeable to PT. Ambulatory transfer to toilet with min assist and min cues for AD management through doorway. Assist from PT for clothing management. Pt reports no need for BM. Urine noted to to be slighty cloudy on this day.   [facility] Gait training with RW 2x 7580fand min assist fromPT with improved gait pattern to prevent foot drop on the LLE in second bout. Gait training in parallel bars forward/reverse 2 x 80f46fith CGA and cues for step length in reverse on the LLE. Prolonged therapeutic rest breaks between bouts.   PT performed orthostatic VS  assesment. sitting  129/84. HR 84.  Standing 106/76 HR 90. Pt reports mild orthostatic s/s   Pt returned to room and performed stand pivot transfer to bed with min assist and RW wiuth cues for turning technique to prevent LOB. Sit>supine completed with increased time and supervision assist, and left supine in bed with call bell in reach and all needs met.       Therapy Documentation Precautions:  Precautions Precautions: Fall Precaution Comments: LT wrist pain with WB (fall 2 months ago on hand. No imaging) Restrictions Weight Bearing Restrictions: No  Vital Signs: See above  Pain: Pain Assessment Pain Score: 0-No pain   Therapy/Group: Individual Therapy  AusLorie Phenix/20/2023, 3:28 PM

## 2022-06-09 NOTE — Progress Notes (Addendum)
Patient ID: Grant [name], male   DOB: 29-Jan-1946, 76 y.o.   MRN: [id number]  Request for allotted facilities sent to [facility]  1:41 PM: [facility] facilities sent to spouse for review at [email or web address]

## 2022-06-09 NOTE — Progress Notes (Addendum)
PROGRESS NOTE   Subjective/Complaints:  No issues overnite  Pt did not sleep well last noc but could not say why except that his BP was checked (3am)  ROS- neg CP, SOB, N/V/D, abdominal pain, HA   Objective:   No results found. Recent Labs    06/07/22 0711  WBC 5.9  HGB 13.2  HCT 39.7  PLT 223    Recent Labs    06/07/22 0711  NA 137  K 4.2  CL 106  CO2 22  GLUCOSE 112*  BUN 14  CREATININE 1.31*  CALCIUM 8.7*     Intake/Output Summary (Last 24 hours) at 06/09/2022 0852 Last data filed at 06/08/2022 1757 Gross per 24 hour  Intake 598 ml  Output 200 ml  Net 398 ml         Physical Exam: Vital Signs Blood pressure 110/75, pulse 74, temperature 97.8 F (36.6 C), temperature source Oral, resp. rate 16, height _0  (1.88 m), SpO2 95 %.   General: No acute distress, seen at bedside Mood and affect are appropriate, pleasant Heart: Regular rate and rhythm no rubs murmurs or extra sounds Lungs: Clear to auscultation, breathing unlabored, occ bibasilar wheezes, no increased WOB Abdomen: Positive bowel sounds, soft nontender to palpation, nondistended Extremities: No clubbing, cyanosis, or edema Skin: No evidence of breakdown, no evidence of rash, warm and dry Neurologic: Cranial nerves II through XII intact, motor strength is 4/5 in bilateral deltoid, bicep, tricep, grip, hip flexor, knee extensors, ankle dorsiflexor and plantar flexor Sensory exam reduced  sensation to light touch  in right upper and left , has intact proprioception LLE Cerebellar exam normal finger to nose to finger as well as heel to shin in right  upper and lower extremities Dysmetria LUE and LLE Musculoskeletal: right hand intrinsic atrophy    Assessment/Plan: 1. Functional deficits which require 3+ hours per day of interdisciplinary therapy in a comprehensive inpatient rehab setting. Physiatrist is providing close team  supervision and 24 hour management of active medical problems listed below. Physiatrist and rehab team continue to assess barriers to discharge/monitor patient progress toward functional and medical goals  Care Tool:  Bathing    Body parts bathed by patient: Chest, Abdomen, Right upper leg, Left upper leg, Buttocks, Face, Left arm   Body parts bathed by helper: Front perineal area, Left lower leg, Right lower leg, Right arm     Bathing assist Assist Level: Moderate Assistance - Patient 50 - 74%     Upper Body Dressing/Undressing Upper body dressing   What is the patient wearing?: Pull over shirt    Upper body assist Assist Level: Moderate Assistance - Patient 50 - 74%    Lower Body Dressing/Undressing Lower body dressing      What is the patient wearing?: Underwear/pull up, Pants     Lower body assist Assist for lower body dressing: Maximal Assistance - Patient 25 - 49%     Toileting Toileting    Toileting assist Assist for toileting: Maximal Assistance - Patient 25 - 49%     Transfers Chair/bed transfer  Transfers assist     Chair/bed transfer assist level: Moderate Assistance - Patient 50 - 74%  Locomotion Ambulation   Ambulation assist      Assist level: Minimal Assistance - Patient > 75% Assistive device: Walker-rolling (required assitance w/ maintaing left hand on walker and RW navigation) Max distance: 59f   Walk 10 feet activity   Assist     Assist level: Minimal Assistance - Patient > 75% (required assitance w/ maintaing left hand on walker and RW navigation) Assistive device: Walker-rolling   Walk 50 feet activity   Assist    Assist level: Minimal Assistance - Patient > 75% (required assitance w/ maintaing left hand on walker and RW navigation) Assistive device: Walker-rolling    Walk 150 feet activity   Assist Walk 150 feet activity did not occur: Safety/medical concerns         Walk 10 feet on uneven surface   activity   Assist Walk 10 feet on uneven surfaces activity did not occur: Safety/medical concerns         Wheelchair     Assist Is the patient using a wheelchair?: Yes Type of Wheelchair: Manual    Wheelchair assist level: Moderate Assistance - Patient 50 - 74% Max wheelchair distance: 570f(Using RUE & RLE)    Wheelchair 50 feet with 2 turns activity    Assist        Assist Level: Moderate Assistance - Patient 50 - 74%   Wheelchair 150 feet activity     Assist      Assist Level: Maximal Assistance - Patient 25 - 49%   Blood pressure 110/75, pulse 74, temperature 97.8 F (36.6 C), temperature source Oral, resp. rate 16, height _0  (1.88 m), SpO2 95 %.  Medical Problem List and Plan: 1. Functional deficits secondary to subacute right MCA ischemic infarction             -patient may  shower             -ELOS/Goals:  12/27, family hoping to d/c prior to 12/25 Team conference today please see physician documentation under team conference tab, met with team  to discuss problems,progress, and goals. Formulized individual treatment plan based on medical history, underlying problem and comorbidities.    -Continue CIR PT/OT/SLP  2.  Antithrombotics: -DVT/anticoagulation:  Pharmaceutical: Eliquis             -antiplatelet therapy: Aspirin 81 mg daily 3. Pain Management: Neurontin 300 mg 3 times daily, reduce nortriptyline to 5013mg nightly, Flexeril 10 mg nightly as needed             off gabapentin due to drowsiness no worsening of pain 12/15  -improved on low dose lyrica 17m73mD for neuropathic pain 4. Mood/Behavior/Sleep: Lexapro 10 mg nightly, nortriptyline.  Provide emotional support             -antipsychotic agents: N/A 5. Neuropsych/cognition: This patient is capable of making decisions on his own behalf. 6. Skin/Wound Care: Routine skin checks 7. Fluids/Electrolytes/Nutrition: Routine in and outs with follow-up chemistries 8.  Atrial  fibrillation/AICD.  Continue amiodarone 200 mg daily as well as Eliquis.  Cardiac rate controlled 9.  Diabetes mellitus.  Hemoglobin A1c 9.9.  Semglee 32 units nightly.  SSI. Check blood sugars before meals and at bedtime CBG (last 3)  Recent Labs    06/08/22 1639 06/08/22 2037 06/09/22 0629  GLUCAP 110* 122* 72   Reduce semglee 29U qhs- well controlled  12/16 reduce semglee to 27u  12/19 reduce Semglee to 25U 12/20 reduce semglee to 22U  10.  Hyperlipidemia.  Lipitor 39m daily 11.  Chronic systolic congestive heart failure.  Monitor for any signs of fluid overload. On Furosemide 22mdaily.  Fluid intake poor will check BMET  12.  BPH.  Hytrin 5 mg twice daily- reduced to 56m23mID due to soft BP 13.  Tobacco use.  Provide counseling 14.  Hypothyroidism.  Synthroid 15.  CKD.  Baseline creatinine 1.43-1.94.  Follow-up chemistries  -Recheck Monday ordered 16. HTN. Long term goal normotensive. Home medications metoprolol and cozaar currently on hold. Monitor with activity. Vitals:   06/08/22 1941 06/09/22 0302  BP: 130/89 110/75  Pulse: 80 74  Resp: 16 16  Temp: 99.2 F (37.3 C) 97.8 F (36.6 C)  SpO2: 97% 95%  May reduce terazosin 56mg66mD  BMET today  250ml20m NS bolus on 12/9- ECHO showed EF 60-65%- ok BPs still soft but asymptomatic will change amitriptyline to nortriptyline 12/18 well controlled  17. Hypothyroidism. Continue synthroid   18.  E coli UTI, completed keflex  LOS: 14 days A FACE TO FACE EVALUATION WAS PERFORMED  AndreCharlett Blake0/2023, 8:52 AM

## 2022-06-09 NOTE — Progress Notes (Signed)
Speech Language Pathology Daily Session Note  Patient Details  Name: Kentley Blyden MRN: [id number] Date of Birth: 1945-06-26  Today's Date: 06/09/2022 SLP Individual Time: 1200-1245 SLP Individual Time Calculation (min): 45 min  Short Term Goals: Week 3: SLP Short Term Goal 1 (Week 3): STG=LTG due to ELOS  Skilled Therapeutic Interventions:Skilled ST services focused on cognitive skills. SLP facilitated left attention, basic-mildly complex problem solving and error awareness in novel PEG design task. Pt demonstrated ability to scan left of midline with supervision A verbal cues but demonstrated poor awareness of deficits, attempting to utilize left hand. Pt required min A verbal cues for problem solving/error awareness in basic designs and mod A verbal cues for problem solving/error awareness in mildly complex tasks. Pt was left in room with call bell within reach and chair alarm set. SLP recommends to continue skilled services.     Pain Pain Assessment Pain Score: 0-No pain  Therapy/Group: Individual Therapy  Rucha Wissinger  [facility] 06/09/2022, 1:45 PM

## 2022-06-09 NOTE — Progress Notes (Signed)
Occupational Therapy Session Note  Patient Details  Name: Grant [name] MRN: [id number] Date of Birth: 08-12-1945  Today's Date: 06/09/2022 OT Individual Time: 1105-1200 OT Individual Time Calculation (min): 55 min    Short Term Goals: Week 2:  OT Short Term Goal 1 (Week 2): Pt will be able to hold static stand balance with no UE support with CGA or less to prep for LB dressing. OT Short Term Goal 2 (Week 2): Pt will be able pull pants over hips with min A using 1 hand support on grab bar or rail. OT Short Term Goal 3 (Week 2): Pt will demostrate improved L side coordination to don a overhead shirt with supervision.  Skilled Therapeutic Interventions/Progress Updates:  Pt received seated in [facility] for skilled OT session with focus on functional mobility and transfers for increased independence with ADLs. Pt agreeable to interventions, demonstrating overall pleasant mood. Pt with no reports of pain, stating "I just gotta get my brain to talk to this leg" in reference to functional performance of L-leg. OT offering intermediate rest breaks and positioning suggestions throughout session to address potential pain/fatigue and maximize participation/safety in session.   Pt dependent for WC transport from room<>ortho gym for time management.   In ortho gym, pt performing multiple STS transfers with overall Min A + RW. Pt requiring heavy verbal cuing for safe hand placement/RW management. Pt with reports of dizziness upon standing and movement, vitals taken throughout session with slight drop noticed.   Pt sidesteps alongside EOM x2 trials using RW + Min-Mod A + cuing for sequencing of movements with RW and bringing awareness to LLE. Pt with increased difficulty performing side-steps towards L-side, observed running over L-foot with RW when walking towards R-side.  In room, pt requires A for re-adjustment of brief/pants, maintaining balance with cuing for L/posterior lean.   Pt remained seated  in WC with all immediate needs met  and lap belt activated at end of session. Pt continues to be appropriate for skilled OT intervention to promote further functional independence.   Therapy Documentation Precautions:  Precautions Precautions: Fall Precaution Comments: LT wrist pain with WB (fall 2 months ago on hand. No imaging) Restrictions Weight Bearing Restrictions: No   Therapy/Group: Individual Therapy  Maudie Mercury, OTR/L, MSOT  06/09/2022, 6:31 AM

## 2022-06-09 NOTE — Progress Notes (Signed)
Occupational Therapy Session Note  Patient Details  Name: Grant [name] MRN: [id number] Date of Birth: 12-24-1945  Today's Date: 06/09/2022 OT Individual Time: 0915-1000 OT Individual Time Calculation (min): 45 min    Short Term Goals: Week 1:  OT Short Term Goal 1 (Week 1): Pt will demonstrate improved LUE coordination to don shirt with min A. OT Short Term Goal 1 - Progress (Week 1): Met OT Short Term Goal 2 (Week 1): Pt will demonstrate improved dynamic standing balance to pull pants over hips with mod A or less to support balance OT Short Term Goal 2 - Progress (Week 1): Met OT Short Term Goal 3 (Week 1): Pt will complete RW transfers to toilet with CGA or less. OT Short Term Goal 3 - Progress (Week 1): Progressing toward goal OT Short Term Goal 4 (Week 1): Pt will demonstrate improved L side awareness during bathing to wash UB with min cues to attend to L arm. OT Short Term Goal 4 - Progress (Week 1): Met Week 2:  OT Short Term Goal 1 (Week 2): Pt will be able to hold static stand balance with no UE support with CGA or less to prep for LB dressing. OT Short Term Goal 2 (Week 2): Pt will be able pull pants over hips with min A using 1 hand support on grab bar or rail. OT Short Term Goal 3 (Week 2): Pt will demostrate improved L side coordination to don a overhead shirt with supervision.    Skilled Therapeutic Interventions/Progress Updates:    Pt received in bed awake but not very "energetic" per pt.  Asked pt to sit to EOB.  He was not able to recall steps for rolling onto side and not utilizing L arm.  Needed max to total A to roll as he could not follow directions well today.  Pt tried to move to sit but kept falling back and instead of using hands on bed he was just reaching forward.  Poor awareness of L hand. Used stedy to move to [facility] over toilet. Standing in stedy, pt did help to push pants down over hips with min A. Sat on toilet and needed max to remove tighter fitting  scrub top.  On toilet, pt engaged in bathing with mod A.  He needed A to reach fully under arms for thorough bathing as he was having difficulty managing wash cloth.  Donned shirt with mod A, pants max A.  No void on toilet.  Pt transferred to [place]. Belt alarm on, telesitter on, all needs met.   Therapy Documentation Precautions:  Precautions Precautions: Fall Precaution Comments: LT wrist pain with WB (fall 2 months ago on hand. No imaging) Restrictions Weight Bearing Restrictions: No    Pain: Pain Assessment Pain Scale: 0-10 Pain Score: 0-No pain ADL: ADL Eating: Set up Grooming: Setup Upper Body Bathing: Supervision/safety, Minimal cueing (using wash mit on L hand) Where Assessed-Upper Body Bathing: Shower Lower Body Bathing: Minimal assistance (using long handled sponge) Where Assessed-Lower Body Bathing: Shower Upper Body Dressing: Minimal assistance, Moderate cueing Where Assessed-Upper Body Dressing: Wheelchair Lower Body Dressing: Moderate assistance, Moderate cueing Where Assessed-Lower Body Dressing: Wheelchair Toileting: Maximal assistance Where Assessed-Toileting: Glass blower/designer: Moderate assistance Toilet Transfer Method: Stand pivot       Therapy/Group: Individual Therapy  [place] 06/09/2022, 10:10 AM

## 2022-06-09 NOTE — Patient Care Conference (Signed)
Inpatient RehabilitationTeam Conference and Plan of Care Update Date: 06/09/2022   Time: 10:14 AM    Patient Name: Grant [name]      Medical Record Number: [id number]  Date of Birth: 1945/08/21 Sex: Male         Room/Bed: 4M13C/4M13C-01 Payor Info: Payor: [employer] / Plan: [facility] / Product Type: *No Product type* /    Admit Date/Time:  05/26/2022  6:32 PM  Primary Diagnosis:  Right middle cerebral artery stroke [facility])  Hospital Problems: Principal Problem:   Right middle cerebral artery stroke [facility])    Expected Discharge Date: Expected Discharge Date: 06/16/22  Team Members Present: Physician leading conference: Dr. Claudette Laws Social Worker Present: Lavera Guise, BSW Nurse Present: Chana Bode, RN PT Present: Grier Rocher, PT OT Present: Primitivo Gauze, OT SLP Present: Colin Benton, SLP PPS Coordinator present : Fae Pippin, SLP     Current Status/Progress Goal Weekly Team Focus  Bowel/Bladder   Continent of bowel. Paritally incontinent of bladder. Use of external catheter d/c. Able to void upon ambulation to bathroom. QHS wears briefs for incontience concerns.   Continue to work towards returning to routine voiding schedule. Continue to work towards preventing skin breakdown/perform peri-care QShift & PRN. Encourage patient to void prior to bedtime.   Continue to assist with tolieting needs QShift and PRN throughout the shift.    Swallow/Nutrition/ Hydration               ADL's   MOD- MAX A for ADLs, MIN A for ADL transfers with Rw, continue to present with L neglect, impaired balance,  LUE sensory deficits and LUE incoordination. very poor body awareness overall, noted to become dizzy lately with standing but doesn't state dizziness until after the fact.   CGA overall   BADL reeducation, functional mobility, balance, body awareness    Mobility   difficult to progress standing balance/ ambulation d/t  continued orthostasis that is paired with lightheadedness symptoms; bed mobility = supervision, transfers MinA to initiate and Mod/ MaxA for balance, ambulation with RW with MinA for balance.   overall CGA/ MinA  NMR for coordination, transfers, ambulation, balance, family education    Communication                Safety/Cognition/ Behavioral Observations  mod-to-max A   min-to-mod A (goals downgraded 12/19)   basic-to-mildly complex problem solving, short-term recall with memory strategies, attention, awareness    Pain   No current pain issues.   Continue to maintain pain goal <3/10.   Assess pain QShift and PRN.    Skin   No skin issues to report.   Continue to maintain skin integrity.  Assess QShift and PRN.      Discharge Planning:  Discharging home with spouse able to assist sup/min a. DME ordered through [place]. Family education 12/21 1-4PM.   Team Discussion: Patient UTI treated, Neuropathic pain medications adjusted along with HTN and DM medications. Continue tele-sitter 24 hrs due to previous fall and switch to a TIS wheelchair for more support when in wheelchair. Progress is limited by poor awareness of left side, delayed processing, poor sensation,  motor planning deficits with memory issues, attention and awareness deficits and is unable to dual task.  Patient on target to meet rehab goals: Currently needs mod assist for ADLs and to get OOB. Once up, transfers with CGA to go straight, but needs min assist for turns. Needs mod - max assist for memory, attention and awareness. Goals for  discharge adjusted.  *See Care Plan and progress notes for long and short-term goals.   Revisions to Treatment Plan:  Downgraded goals to CGA for PT and SLP  downgraded to mod A   Teaching Needs: Safety, medications, transfers, toileting, dietary modifications, etc.  Current Barriers to Discharge: Decreased caregiver support and Home enviroment access/layout  Possible Resolutions to  Barriers: Family education set for 06/10/22 DME: Darrall Dears, W/C HH follow up services     Medical Summary Current Status: poor safety awareness , had a fall, incont at times, fluctuating  Barriers to Discharge: Medical stability   Possible Resolutions to [employer] Focus: work on Left sided neglect, safety awareness   Continued Need for Acute Rehabilitation Level of Care: The patient requires daily medical management by a physician with specialized training in physical medicine and rehabilitation for the following reasons: Direction of a multidisciplinary physical rehabilitation program to maximize functional independence : Yes Medical management of patient stability for increased activity during participation in an intensive rehabilitation regime.: Yes Analysis of laboratory values and/or radiology reports with any subsequent need for medication adjustment and/or medical intervention. : Yes   I attest that I was present, lead the team conference, and concur with the assessment and plan of the team.   Dorien Chihuahua B 06/09/2022, 1:39 PM

## 2022-06-10 DIAGNOSIS — Z794 Long term (current) use of insulin: Secondary | ICD-10-CM

## 2022-06-10 DIAGNOSIS — E1169 Type 2 diabetes mellitus with other specified complication: Secondary | ICD-10-CM

## 2022-06-10 LAB — GLUCOSE, CAPILLARY
Glucose-Capillary: 126 mg/dL — ABNORMAL HIGH (ref 70–99)
Glucose-Capillary: 74 mg/dL (ref 70–99)
Glucose-Capillary: 93 mg/dL (ref 70–99)
Glucose-Capillary: 129 mg/dL — ABNORMAL HIGH (ref 70–99)

## 2022-06-10 NOTE — Plan of Care (Signed)
  Problem: Consults Goal: RH STROKE PATIENT EDUCATION Description: See Patient Education module for education specifics  Outcome: Progressing Goal: Diabetes Guidelines if Diabetic/Glucose > 140 Description: If diabetic or lab glucose is > 140 mg/dl - Initiate Diabetes/Hyperglycemia Guidelines & Document Interventions  Outcome: Progressing   Problem: RH BOWEL ELIMINATION Goal: RH STG MANAGE BOWEL WITH ASSISTANCE Description: STG Manage Bowel with min Assistance. Outcome: Progressing Goal: RH STG MANAGE BOWEL W/MEDICATION W/ASSISTANCE Description: STG Manage Bowel with Medication with min Assistance. Outcome: Progressing   Problem: RH BLADDER ELIMINATION Goal: RH STG MANAGE BLADDER WITH ASSISTANCE Description: STG Manage Bladder With min Assistance Outcome: Progressing   Problem: RH SKIN INTEGRITY Goal: RH STG SKIN FREE OF INFECTION/BREAKDOWN Description: Skin will be free of infection/breakdown with min assist Outcome: Progressing Goal: RH STG ABLE TO PERFORM INCISION/WOUND CARE W/ASSISTANCE Description: STG Able To Perform Incision/Wound Care With min Assistance. Outcome: Progressing   Problem: RH SAFETY Goal: RH STG ADHERE TO SAFETY PRECAUTIONS W/ASSISTANCE/DEVICE Description: STG Adhere to Safety Precautions With min Assistance/Device. Outcome: Progressing   Problem: RH PAIN MANAGEMENT Goal: RH STG PAIN MANAGED AT OR BELOW PT'S PAIN GOAL Description: Pain will be managed at 4 out of 10 on pain scale with PRN medications min assist Outcome: Progressing   Problem: RH KNOWLEDGE DEFICIT Goal: RH STG INCREASE KNOWLEDGE OF DIABETES Description: Patient/caregiver will be able to managed DM medications and lifestyle changes to improve A!C of 9.9 from nursing education and handouts independently  Outcome: Progressing Goal: RH STG INCREASE KNOWLEDGE OF HYPERTENSION Description: Patient/caregiver will be able to manage HTN medications and lifestyle changes from nursing education  and handouts independently  Outcome: Progressing Goal: RH STG INCREASE KNOWLEGDE OF HYPERLIPIDEMIA Description: Patient/caregiver will be able to manage HLD medications and lifestyle changes from nursing education and nursing handouts independently  Outcome: Progressing Goal: RH STG INCREASE KNOWLEDGE OF STROKE PROPHYLAXIS Description: Patient/caregiver will be able to manage stroke prophylaxis medications from nursing education and handouts independently  Outcome: Progressing   Problem: Consults Goal: RH STROKE PATIENT EDUCATION Description: See Patient Education module for education specifics  Outcome: Progressing Goal: Nutrition Consult-if indicated Outcome: Progressing Goal: Diabetes Guidelines if Diabetic/Glucose > 140 Description: If diabetic or lab glucose is > 140 mg/dl - Initiate Diabetes/Hyperglycemia Guidelines & Document Interventions  Outcome: Progressing   Problem: RH SKIN INTEGRITY Goal: RH STG SKIN FREE OF INFECTION/BREAKDOWN Outcome: Progressing Goal: RH STG MAINTAIN SKIN INTEGRITY WITH ASSISTANCE Description: STG Maintain Skin Integrity With Assistance. Outcome: Progressing Goal: RH STG ABLE TO PERFORM INCISION/WOUND CARE W/ASSISTANCE Description: STG Able To Perform Incision/Wound Care With Assistance. Outcome: Progressing   Problem: RH SAFETY Goal: RH STG ADHERE TO SAFETY PRECAUTIONS W/ASSISTANCE/DEVICE Description: STG Adhere to Safety Precautions With Assistance/Device. Outcome: Progressing Goal: RH STG DECREASED RISK OF FALL WITH ASSISTANCE Description: STG Decreased Risk of Fall With Assistance. Outcome: Progressing   Problem: RH PAIN MANAGEMENT Goal: RH STG PAIN MANAGED AT OR BELOW PT'S PAIN GOAL Outcome: Progressing

## 2022-06-10 NOTE — Progress Notes (Signed)
Assessed patient this morning before medication was administered. Patient presented sleeping but easily arousable. B/P and heart rate taken before meds were given. Presented at 120/80 and 71 bpm. Patient received all meds as ordered. Patient administered medication with sips of water with straw. No observation of coughing during administration. Patient presented with no pills in mouth after administration. Patient able to drink half cup of water no coughing present. Patient able to understand questions nurse asked. Cletis Media, RN

## 2022-06-10 NOTE — Progress Notes (Signed)
Speech Language Pathology Daily Session Note  Patient Details  Name: Grant [name] MRN: [id number] Date of Birth: Apr 12, 1946  Today's Date: 06/10/2022 SLP Individual Time: 1120-1155 and 1450-1525 SLP Individual Time Calculation (min): 70 min   Short Term Goals: Week 3: SLP Short Term Goal 1 (Week 3): STG=LTG due to ELOS  Skilled Therapeutic Interventions:  Session 1: Skilled ST treatment focused on informal cognitive-linguistic, speech, and swallow evaluation due to OT report of increased coughing at breakfast and increased slurred speech. Pt was greeted semi reclined in bed on arrival and denied acute changes. Pt reported consuming a hard boiled egg during breakfast and felt a piece of the shell got lodged in his throat which resulted in coughing d/t attempts to expel. OT reported pt had just woken up prior to their session therefore question if fatigue impacted pt's performance.  Pt was alert and oriented x4. There were no appreciable changes to speech function. OME appeared within functional limits. Cognition appears at baseline per documenting SLP's experience with patient.   Pt agreeable to consume sips of thin liquid by straw without overt s/sx of aspiration and what appeared to be a timely swallow response. Pt agreeable to consume dys 3 and regular textures with effective and timely mastication, adequate oral clearance, and without overt s/sx of aspiration. SLP recommends continuation of regular textures and thin liquids. Recommend intermittent supervision due to known fluctuation with alertness/fatigue. Notified team of observations and recommendations.   Patient was left in bed with alarm activated and immediate needs within reach at end of session. Continue per current plan of care.    Session 2: Skilled treatment session focused on education with the patient and spouse. SLP facilitated session by providing education regarding patient's current deficits and their impact on  attention, problem solving, awareness, memory and overall safety. SLP reinforced the importance of 24 hour supervision to ensure safe decision making as patient has decreased awareness of deficits. SLP provided strategies to maximize attention, recall, problem solving, and overall safety with functional tasks. SLP also provided examples cognitive activities/functional tasks patient can perform with assistance at home for ongoing improvement of pt's overall cognitive functioning. All verbalized understanding of education and SLP provided handouts to reinforce information.   Pain  None/denied  Therapy/Group: Individual Therapy  Tamala Ser 06/10/2022, 4:02 PM

## 2022-06-10 NOTE — Progress Notes (Signed)
Physical Therapy Session Note  Patient Details  Name: Grant [name] MRN: [id number] Date of Birth: 09/12/1945  Today's Date: 06/10/2022 PT Individual Time: 1305-1400 PT Individual Time Calculation (min): 55 min   Short Term Goals: Week 2:  PT Short Term Goal 1 (Week 2): Pt will trasnfer to [facility] with CGA consistently PT Short Term Goal 2 (Week 2): Pt will ambulate 4ft with CGA and no LOB PT Short Term Goal 3 (Week 2): Pt will tolerate standing >3 mintues to imrpove safety with ADLS and CGA  Skilled Therapeutic Interventions/Progress Updates:    Pt received supine in bed with his wife, Grant [name], present for family education/training. Pt agreeable to therapy session.   Therapist educated pt's wife on the following:  - pt's L hemiparesis and L inattention - pt's tendency for L posterior lean during mobility - pt's current min assist level overall with some fluctuations  Pt's wife reports she has already received the recommended RW and wheelchair at home.  Pt performed supine>sitting R EOB, HOB flat and not using bedrail to simulate home environment, when given increased time, pt able to complete with min assist for trunk control due to persistent posterior lean. Cued pt to lean forward and touch his toes to "break through" the posterior lean and then pt able to sit EOB in midline with supervision.  Discussed possible need for hospital bed to increase pt independence and decrease burden of care, but pt's wife reports she is unsure if they have room for that in their home.  Educated pt's wife on pt's need for tennis shoes - planning to bring tomorrow.  Sit>stand EOB>RW with min assist and pt noted to severely push backs of legs against bed with persistent posterior lean - actually causing him to sit back down on EOB due to inability to achieve upright balance. Returned to standing with min assist with decreased posterior lean.  After this, pt's wife already reporting she does not feel that  she can safely provide the level of assistance pt is requiring at this time and states that he would have to be at a consistent supervision primarily (possible light CGA) level in order for her to care for him. Despite this, pt's wife continued to observe throughout session to see pt's CLOF.  R stand pivot EOB>w/c using RW with heavy min assist and pt having mild L lateral lean and poor motor planning to turn hips fully towards seat on his R before initiating sitting down demonstrating likely impaired spatial orientation - pt does this multiple times during session when going to sit in w/c.  Transported to/from gym in w/c for time management and energy conservation.  Gait training ~61ft x2 using RW with therapist providing heavy min assist progressed to less min assist on 2nd bout - initially pt demos persistent L lateral trunk lean during gait with poor ability to weight shift R and allow sufficient L foot clearance and step length as well as overall imbalance during gait.  Gait training additional 60ft using RW with progression to turns through doorway with pt demoing onset of L lean when turning R but pt able to maintain balance with continued min assist.   Transported pt back to room and pt left seated in TIS w/c in care of OT.   Therapy Documentation Precautions:  Precautions Precautions: Fall Precaution Comments: LT wrist pain with WB (fall 2 months ago on hand. No imaging) Restrictions Weight Bearing Restrictions: No   Pain: No reports of pain throughout session.  Therapy/Group: Individual Therapy  Ginny Forth , PT, DPT, NCS, CSRS 06/10/2022, 12:28 PM

## 2022-06-10 NOTE — Progress Notes (Signed)
Physical Therapy Session Note  Patient Details  Name: Grant [name] MRN: [id number] Date of Birth: 1945-08-26  Today's Date: 06/10/2022 PT Individual Time: 1747-1595 PT Individual Time Calculation (min): 40 min   Short Term Goals: Week 2:  PT Short Term Goal 1 (Week 2): Pt will trasnfer to [facility] with CGA consistently PT Short Term Goal 2 (Week 2): Pt will ambulate 40f with CGA and no LOB PT Short Term Goal 3 (Week 2): Pt will tolerate standing >3 mintues to imrpove safety with ADLS and CGA  Skilled Therapeutic Interventions/Progress Updates:      NT assisting patient to TIS w/c via Stedy on arrival, patient just finishing up on the bathroom. Direct handoff of care with patient in w/c - agreeable to PT tx. He denies pain. Assisted with removing hospital gown and donning long-sleeve T-shirt - modA needed due to motor planning deficits and L sided weakness/sensory impairments.   Transported to main rehab gym in w/c for time management.   Sit<>stand to RW from w/c with CGA. Ambulated ~1072fwith minA and RW - increased assist needed as he began to fatigue. Pt letting go of walker to adjust pants - strong posterior lean and minA needed to manage his balance. He would also let of the walker at times to adjust his glasses, again leaning posteriorly without warning. Primary gait deficits observed include narrow BOS, decreased step length and stance time on L, and L inattention. Gait distances limited by fatigue.   MD arriving for morning rounds - patient voicing no concerns.  Stair training using 6inch steps and 2 hand rails. Pt surprisingly was able to ascend the stairs with minA for managing his posterior lean - cues for safety and sequencing but patient self selecting a reciprocal pattern and had difficulty adjusting to a step-to pattern. Unfortunately, he required a heavy maxA for safely descending the steps - increased assist needed due to motor apraxia, STRONG posterior lean, flexed  hips/knees/trunk, difficulty sequencing his arms and leaving them in front of him on rails, and increased fear of falling.  Pt returned to his room in w/c. Completed ambulatory transfer from w/c to bed ~1072fwith minA and RW - cues for safety approach to bed, for keeping body within walker frame as he turns, and for squaring against the bed prior to sitting as he sit's sideways on the bed. He can complete sit to supine with supervision with HOB raised, increased effort for managing his LLE. He also needed some cues for aligning himself in the bed. Pt c/o indigestion and requested Tums - RN notified via secure chat. Alarm on, all needs met.   Therapy Documentation Precautions:  Precautions Precautions: Fall Precaution Comments: LT wrist pain with WB (fall 2 months ago on hand. No imaging) Restrictions Weight Bearing Restrictions: No General:     Therapy/Group: Individual Therapy  ChrAlger Simons/21/2023, 7:42 AM

## 2022-06-10 NOTE — Progress Notes (Signed)
Occupational Therapy Session Note  Patient Details  Name: Grant [name] MRN: [id number] Date of Birth: [birth year]  Today's Date: 06/10/2022 OT Individual Time: 0905-1001 OT Individual Time Calculation (min): 56 min    Short Term Goals: Week 2:  OT Short Term Goal 1 (Week 2): Pt will be able to hold static stand balance with no UE support with CGA or less to prep for LB dressing. OT Short Term Goal 2 (Week 2): Pt will be able pull pants over hips with min A using 1 hand support on grab bar or rail. OT Short Term Goal 3 (Week 2): Pt will demostrate improved L side coordination to don a overhead shirt with supervision.  Skilled Therapeutic Interventions/Progress Updates:  Pt received resting in bed for skilled OT session with focus on ADL retraining. Pt agreeable to interventions, demonstrating overall pleasant mood. Pt with no reports of pain. OT offering intermediate rest breaks and positioning suggestions throughout session to address potential pain/fatigue and maximize participation/safety in session.   Pt received awaiting set-up assistance for breakfast items, including orange juice/coffee, hard boiled eggs, sausage, and toast. OT initiating the de-shelling of 2 eggs, pt completing for in-hand manipulation and bilateral integration with increased time. Pt with onset of cough after eating first egg, transitioning to seating EOB with HOB elevated/bed rail, for improved swallowing positioning.   Seated EOB, pt continues to eat breakfast with focused placed on use of L-hand for self-feeding of sausage/toast, using R-hand for drinks due to decreased grip strength/coordination of L-hand.   Pt tolerates ~10 mins of sitting EOB with Min-Mod A for dynamic seated balance, pt with strong R/posterior lean when using R-hand to feed.  Pt then asking to rest, requiring increased A to change brief and don pants in supine. Pt requiring multimodal cuing to perform R/L rolls in bed to assist with  dressing.   Pt remained resting in bed with all immediate needs met and bed alarm activated at end of session. Pt continues to be appropriate for skilled OT intervention to promote further functional independence.   Therapy Documentation Precautions:  Precautions Precautions: Fall Precaution Comments: LT wrist pain with WB (fall 2 months ago on hand. No imaging) Restrictions Weight Bearing Restrictions: No   Therapy/Group: Individual Therapy  Maudie Mercury, OTR/L, MSOT  06/10/2022, 6:42 AM

## 2022-06-10 NOTE — Progress Notes (Signed)
Occupational Therapy Session Note  Patient Details  Name: Grant [name] MRN: [id number] Date of Birth: September 05, 1945  Today's Date: 06/10/2022 OT Individual Time: 1400-1450 OT Individual Time Calculation (min): 50 min    Short Term Goals: Week 2:  OT Short Term Goal 1 (Week 2): Pt will be able to hold static stand balance with no UE support with CGA or less to prep for LB dressing. OT Short Term Goal 2 (Week 2): Pt will be able pull pants over hips with min A using 1 hand support on grab bar or rail. OT Short Term Goal 3 (Week 2): Pt will demostrate improved L side coordination to don a overhead shirt with supervision.  Skilled Therapeutic Interventions/Progress Updates:   Pt greeted seated in w/c with pt handed off from PT session. pt agreeable to OT intervention. Session focus on family ed with wife. Wife states that she can only provide supervision for the pt, verbal education provided that at this time pts level of assist for ADLs and functional mobility fluctuates greatly from [facility]- MODA for transfers with RW. Education provided that pt continues to have posterior lean in standing as well as impaired motor planning on L side.   Pt did report need to void bladder with pt completing ambulatory toilet transfer with RW with MIN A, pt needed MOD A for 3/3 toileting tasks with pt noted to urinate on floor with no awareness stating "it was that other man that did it." Pt required MOD A to walk back to w/c from bathroom with Rw d/t L lateral lean and max cues to keep feet close to RW.   Did provide education to wife on use of stedy, because at this point, if pt were to DC home feel pt would require this level of assist for ADLs d/t pts ability to fluctuate regularly. Wife unable to even roll the stedy without pain in her back from sciatica, therefore only provided verbal/ demonstration of stedy.   Discussed shower set up at home with wife reporting walkin shower with seat delivered today. Pt  would need to use RW to step into shower as there are no grab bars at this time. Attempted stepping backwards into walkin shower, however pt leaning too much posteriorly making transfer unsafe therefore terminated transfer. Education provided to wife that at this time would recommend bed level bathing if pt were to go home at this level.   Ended session with pt seated in w/c with all needs within reach and safety belt alarm activated.                   Therapy Documentation Precautions:  Precautions Precautions: Fall Precaution Comments: LT wrist pain with WB (fall 2 months ago on hand. No imaging) Restrictions Weight Bearing Restrictions: No  Pain: no pain reported     Therapy/Group: Individual Therapy  Grant [name] 06/10/2022, 3:42 PM

## 2022-06-10 NOTE — Progress Notes (Signed)
Patient ID: Grant [name], male   DOB: 1946-01-24, 75 y.o.   MRN: [id number] [name] into to introduce myself to let wife know I would be covering for [facility] while she is out. Wife is here for education and see how much assist pt will be. She feels at this time he is much care and really needs him to get to CGA-supervision level to be able to take him home. She would like an extension here instead of pursuing SNF. She feels he is doing better here and is here daily to see him. She does have [facility] SNF list [facility]. She wants to team to discuss a possible extension here. Asked wife to come back in and actually do hands on care and not just observe him in therapies. Will try to push wife to make a decision and then begin working on plan. Team will need to discuss if extension is appropriate.

## 2022-06-10 NOTE — Progress Notes (Signed)
PROGRESS NOTE   Subjective/Complaints:  Reports he "feel better now than I did 40 years ago. No new concerns.   ROS- neg CP, SOB, N/V/D, abdominal pain, HA, cough   Objective:   No results found. No results for input(s): "WBC", "HGB", "HCT", "PLT" in the last 72 hours.  No results for input(s): "NA", "K", "CL", "CO2", "GLUCOSE", "BUN", "CREATININE", "CALCIUM" in the last 72 hours.   Intake/Output Summary (Last 24 hours) at 06/10/2022 1322 Last data filed at 06/10/2022 0930 Gross per 24 hour  Intake 1080 ml  Output 800 ml  Net 280 ml         Physical Exam: Vital Signs Blood pressure 120/80, pulse 71, temperature 97.8 F (36.6 C), temperature source Oral, resp. rate 18, height 6\' 2"  (1.88 m), SpO2 95 %.   General: No acute distress, working with therapy in gym Mood and affect are appropriate, pleasant Heart: Regular rate and rhythm no rubs murmurs or extra sounds Lungs: Clear to auscultation, breathing unlabored, occ bibasilar wheezes, no increased WOB Abdomen: Positive bowel sounds, soft nontender to palpation, nondistended Extremities: No clubbing, cyanosis, or edema Skin: No evidence of breakdown, no evidence of rash, warm and dry Neurologic: Cranial nerves II through XII intact, motor strength is 4/5 in bilateral deltoid, bicep, tricep, grip, hip flexor, knee extensors, ankle dorsiflexor and plantar flexor Sensory exam reduced  sensation to light touch  in right upper and left , has intact proprioception LLE Cerebellar exam normal finger to nose to finger as well as heel to shin in right  upper and lower extremities Dysmetria LUE and LLE Musculoskeletal: right hand intrinsic atrophy    Assessment/Plan: 1. Functional deficits which require 3+ hours per day of interdisciplinary therapy in a comprehensive inpatient rehab setting. Physiatrist is providing close team supervision and 24 hour management of active  medical problems listed below. Physiatrist and rehab team continue to assess barriers to discharge/monitor patient progress toward functional and medical goals  Care Tool:  Bathing    Body parts bathed by patient: Chest, Abdomen, Right upper leg, Left upper leg, Buttocks, Face, Left arm   Body parts bathed by helper: Front perineal area, Left lower leg, Right lower leg, Right arm     Bathing assist Assist Level: Moderate Assistance - Patient 50 - 74%     Upper Body Dressing/Undressing Upper body dressing   What is the patient wearing?: Pull over shirt    Upper body assist Assist Level: Moderate Assistance - Patient 50 - 74%    Lower Body Dressing/Undressing Lower body dressing      What is the patient wearing?: Underwear/pull up, Pants     Lower body assist Assist for lower body dressing: Maximal Assistance - Patient 25 - 49%     Toileting Toileting    Toileting assist Assist for toileting: Maximal Assistance - Patient 25 - 49%     Transfers Chair/bed transfer  Transfers assist     Chair/bed transfer assist level: Moderate Assistance - Patient 50 - 74%     Locomotion Ambulation   Ambulation assist      Assist level: Minimal Assistance - Patient > 75% Assistive device: Walker-rolling (required assitance w/  maintaing left hand on walker and RW navigation) Max distance: 52ft   Walk 10 feet activity   Assist     Assist level: Minimal Assistance - Patient > 75% (required assitance w/ maintaing left hand on walker and RW navigation) Assistive device: Walker-rolling   Walk 50 feet activity   Assist    Assist level: Minimal Assistance - Patient > 75% (required assitance w/ maintaing left hand on walker and RW navigation) Assistive device: Walker-rolling    Walk 150 feet activity   Assist Walk 150 feet activity did not occur: Safety/medical concerns         Walk 10 feet on uneven surface  activity   Assist Walk 10 feet on uneven surfaces  activity did not occur: Safety/medical concerns         Wheelchair     Assist Is the patient using a wheelchair?: Yes Type of Wheelchair: Manual    Wheelchair assist level: Moderate Assistance - Patient 50 - 74% Max wheelchair distance: 70ft (Using RUE & RLE)    Wheelchair 50 feet with 2 turns activity    Assist        Assist Level: Moderate Assistance - Patient 50 - 74%   Wheelchair 150 feet activity     Assist      Assist Level: Maximal Assistance - Patient 25 - 49%   Blood pressure 120/80, pulse 71, temperature 97.8 F (36.6 C), temperature source Oral, resp. rate 18, height 6\' 2"  (1.88 m), SpO2 95 %.  Medical Problem List and Plan: 1. Functional deficits secondary to subacute right MCA ischemic infarction             -patient may  shower             -ELOS/Goals:  12/27, family hoping to d/c prior to 12/25 -Continue CIR PT/OT/SLP -possible SNF discharge  2.  Antithrombotics: -DVT/anticoagulation:  Pharmaceutical: Eliquis             -antiplatelet therapy: Aspirin 81 mg daily 3. Pain Management: Neurontin 300 mg 3 times daily, reduce nortriptyline to 50mg  mg nightly, Flexeril 10 mg nightly as needed             off gabapentin due to drowsiness no worsening of pain 12/15  -improved on low dose lyrica 25mg  BID for neuropathic pain 4. Mood/Behavior/Sleep: Lexapro 10 mg nightly, nortriptyline.  Provide emotional support             -antipsychotic agents: N/A 5. Neuropsych/cognition: This patient is capable of making decisions on his own behalf. 6. Skin/Wound Care: Routine skin checks 7. Fluids/Electrolytes/Nutrition: Routine in and outs with follow-up chemistries 8.  Atrial fibrillation/AICD.  Continue amiodarone 200 mg daily as well as Eliquis.  Cardiac rate controlled 9.  Diabetes mellitus.  Hemoglobin A1c 9.9.  Semglee 32 units nightly.  SSI. Check blood sugars before meals and at bedtime CBG (last 3)  Recent Labs    06/09/22 2050 06/10/22 0607  06/10/22 1128  GLUCAP 134* 126* 74   Reduce semglee 29U qhs- well controlled  12/16 reduce semglee to 27u  12/19 reduce Semglee to 25U 12/20 reduce semglee to 22U 12/20 reduce semglee to 22U Monitor for response to insulin change  10.  Hyperlipidemia.  Lipitor 40mg  daily 11.  Chronic systolic congestive heart failure.  Monitor for any signs of fluid overload. On Furosemide 20mg  daily.  Fluid intake poor will check BMET  12.  BPH.  Hytrin 5 mg twice daily- reduced to 1mg  BID due to  soft BP 13.  Tobacco use.  Provide counseling 14.  Hypothyroidism.  Synthroid 15.  CKD.  Baseline creatinine 1.43-1.94.  Follow-up chemistries  -Recheck Monday ordered 16. HTN. Long term goal normotensive. Home medications metoprolol and cozaar currently on hold. Monitor with activity. Vitals:   06/10/22 0826 06/10/22 0829  BP: 120/80 120/80  Pulse:  71  Resp:    Temp:    SpO2:    May reduce terazosin 1mg  BID  BMET today  .45 NS bolus on 12/9- ECHO showed EF 60-65%- ok BPs still soft but asymptomatic will change amitriptyline to nortriptyline 12/18 well controlled  17. Hypothyroidism. Continue synthroid   18.  E coli UTI, completed keflex  LOS: 15 days A FACE TO FACE EVALUATION WAS PERFORMED  1/19 06/10/2022, 1:22 PM

## 2022-06-11 DIAGNOSIS — I951 Orthostatic hypotension: Secondary | ICD-10-CM

## 2022-06-11 DIAGNOSIS — N400 Enlarged prostate without lower urinary tract symptoms: Secondary | ICD-10-CM

## 2022-06-11 LAB — GLUCOSE, CAPILLARY
Glucose-Capillary: 84 mg/dL (ref 70–99)
Glucose-Capillary: 71 mg/dL (ref 70–99)
Glucose-Capillary: 70 mg/dL (ref 70–99)
Glucose-Capillary: 102 mg/dL — ABNORMAL HIGH (ref 70–99)

## 2022-06-11 MED ORDER — INSULIN GLARGINE-YFGN 100 UNIT/ML ~~LOC~~ SOLN
18.0000 [IU] | Freq: Every day | SUBCUTANEOUS | Status: DC
  Administered 2022-06-11: 18 [IU] via SUBCUTANEOUS
  Filled 2022-06-11 (×2): qty 0.18

## 2022-06-11 NOTE — Progress Notes (Signed)
Patient ID: Grant [name], male   DOB: 09-16-1945, 76 y.o.   MRN: [id number]  Spoke with wife via telephone to discuss team does not feel an extension would benefit him and get him to the level that she could care for him since he fluctuates from day to day. She reluctantly agreed to have [place] pursue SNF but is still upset the closest place the [place] covers is in [place]. Will being the process and wife is aware he will stay here and continue therapies while pursuing. Christina-SW will be back on Tuesday to take the case back.

## 2022-06-11 NOTE — Plan of Care (Signed)
  Problem: RH Balance Goal: LTG: Patient will maintain dynamic sitting balance (OT) Description: LTG:  Patient will maintain dynamic sitting balance with assistance during activities of daily living (OT) Flowsheets (Taken 06/11/2022 1957) LTG: Pt will maintain dynamic sitting balance during ADLs with: Moderate Assistance - Patient 50 - 74% Goal: LTG Patient will maintain dynamic standing with ADLs (OT) Description: LTG:  Patient will maintain dynamic standing balance with assist during activities of daily living (OT)  Flowsheets (Taken 06/11/2022 1958) LTG: Pt will maintain dynamic standing balance during ADLs with: Moderate Assistance - Patient 50 - 74%   Problem: Sit to Stand Goal: LTG:  Patient will perform sit to stand in prep for activites of daily living with assistance level (OT) Description: LTG:  Patient will perform sit to stand in prep for activites of daily living with assistance level (OT) Flowsheets (Taken 06/11/2022 1958) LTG: PT will perform sit to stand in prep for activites of daily living with assistance level: Minimal Assistance - Patient > 75%   Problem: RH Bathing Goal: LTG Patient will bathe all body parts with assist levels (OT) Description: LTG: Patient will bathe all body parts with assist levels (OT) Flowsheets (Taken 06/11/2022 1958) LTG: Pt will perform bathing with assistance level/cueing: Moderate Assistance - Patient 50 - 74%   Problem: RH Dressing Goal: LTG Patient will perform upper body dressing (OT) Description: LTG Patient will perform upper body dressing with assist, with/without cues (OT). Flowsheets (Taken 06/11/2022 1958) LTG: Pt will perform upper body dressing with assistance level of: Minimal Assistance - Patient > 75% Goal: LTG Patient will perform lower body dressing w/assist (OT) Description: LTG: Patient will perform lower body dressing with assist, with/without cues in positioning using equipment (OT) Flowsheets (Taken 06/11/2022  1958) LTG: Pt will perform lower body dressing with assistance level of: Moderate Assistance - Patient 50 - 74%   Problem: RH Toileting Goal: LTG Patient will perform toileting task (3/3 steps) with assistance level (OT) Description: LTG: Patient will perform toileting task (3/3 steps) with assistance level (OT)  Flowsheets (Taken 06/11/2022 1958) LTG: Pt will perform toileting task (3/3 steps) with assistance level: Moderate Assistance - Patient 50 - 74%   Problem: RH Functional Use of Upper Extremity Goal: LTG Patient will use RT/LT upper extremity as a (OT) Description: LTG: Patient will use right/left upper extremity as a stabilizer/gross assist/diminished/nondominant/dominant level with assist, with/without cues during functional activity (OT) Flowsheets (Taken 06/11/2022 1958) LTG: Pt will use upper extremity in functional activity with assistance level of: Supervision/Verbal cueing   Problem: RH Toilet Transfers Goal: LTG Patient will perform toilet transfers w/assist (OT) Description: LTG: Patient will perform toilet transfers with assist, with/without cues using equipment (OT) Flowsheets (Taken 06/11/2022 1958) LTG: Pt will perform toilet transfers with assistance level of: Moderate Assistance - Patient 50 - 74%   Problem: RH Tub/Shower Transfers Goal: LTG Patient will perform tub/shower transfers w/assist (OT) Description: LTG: Patient will perform tub/shower transfers with assist, with/without cues using equipment (OT) Flowsheets (Taken 06/11/2022 1958) LTG: Pt will perform tub/shower stall transfers with assistance level of: Moderate Assistance - Patient 50 - 74%   Problem: RH Awareness Goal: LTG: Patient will demonstrate awareness during functional activites type of (OT) Description: LTG: Patient will demonstrate awareness during functional activites type of (OT) Flowsheets (Taken 05/27/2022 1218 by Earle Gell, OT) Patient will demonstrate awareness during functional  activites type of:  Intellectual  Emergent

## 2022-06-11 NOTE — Progress Notes (Signed)
PROGRESS NOTE   Subjective/Complaints:  No new concerns this Am. Reports he feels well overall. Therapy orthostatic VS were positive today.    ROS- neg CP, SOB, N/V/D, abdominal pain, HA, cough, dizziness    Objective:   No results found. No results for input(s): "WBC", "HGB", "HCT", "PLT" in the last 72 hours.  No results for input(s): "NA", "K", "CL", "CO2", "GLUCOSE", "BUN", "CREATININE", "CALCIUM" in the last 72 hours.   Intake/Output Summary (Last 24 hours) at 06/11/2022 1405 Last data filed at 06/11/2022 0945 Gross per 24 hour  Intake 480 ml  Output --  Net 480 ml         Physical Exam: Vital Signs Blood pressure 114/71, pulse 77, temperature 98.6 F (37 C), resp. rate 18, height 6\' 2"  (1.88 m), SpO2 100 %.   General: No acute distress, seen at bedside Mood and affect are appropriate, pleasant Heart: Regular rate and rhythm no rubs murmurs or extra sounds Lungs: Clear to auscultation, breathing unlabored, occ bibasilar wheezes, no increased WOB Abdomen: Positive bowel sounds, soft nontender to palpation, nondistended Extremities: No clubbing, cyanosis, or edema Skin: No evidence of breakdown, no evidence of rash, warm and dry Neurologic: Cranial nerves II through XII grossly intact, motor strength is 4/5 in bilateral deltoid, bicep, tricep, grip, hip flexor, knee extensors, ankle dorsiflexor and plantar flexor Sensory exam reduced  sensation to light touch  in right upper and left , has intact proprioception LLE Cerebellar exam normal finger to nose to finger as well as heel to shin in right  upper and lower extremities Dysmetria LUE and LLE Musculoskeletal: right hand intrinsic atrophy    Assessment/Plan: 1. Functional deficits which require 3+ hours per day of interdisciplinary therapy in a comprehensive inpatient rehab setting. Physiatrist is providing close team supervision and 24 hour management of  active medical problems listed below. Physiatrist and rehab team continue to assess barriers to discharge/monitor patient progress toward functional and medical goals  Care Tool:  Bathing    Body parts bathed by patient: Chest, Abdomen, Right upper leg, Left upper leg, Buttocks, Face, Left arm   Body parts bathed by helper: Front perineal area, Left lower leg, Right lower leg, Right arm     Bathing assist Assist Level: Moderate Assistance - Patient 50 - 74%     Upper Body Dressing/Undressing Upper body dressing   What is the patient wearing?: Pull over shirt    Upper body assist Assist Level: Moderate Assistance - Patient 50 - 74%    Lower Body Dressing/Undressing Lower body dressing      What is the patient wearing?: Underwear/pull up, Pants     Lower body assist Assist for lower body dressing: Maximal Assistance - Patient 25 - 49%     Toileting Toileting    Toileting assist Assist for toileting: Moderate Assistance - Patient 50 - 74%     Transfers Chair/bed transfer  Transfers assist     Chair/bed transfer assist level: Minimal Assistance - Patient > 75%     Locomotion Ambulation   Ambulation assist      Assist level: Minimal Assistance - Patient > 75% Assistive device: Walker-rolling (required assitance w/ maintaing  left hand on walker and RW navigation) Max distance: 75ft   Walk 10 feet activity   Assist     Assist level: Minimal Assistance - Patient > 75% (required assitance w/ maintaing left hand on walker and RW navigation) Assistive device: Walker-rolling   Walk 50 feet activity   Assist    Assist level: Minimal Assistance - Patient > 75% (required assitance w/ maintaing left hand on walker and RW navigation) Assistive device: Walker-rolling    Walk 150 feet activity   Assist Walk 150 feet activity did not occur: Safety/medical concerns         Walk 10 feet on uneven surface  activity   Assist Walk 10 feet on uneven  surfaces activity did not occur: Safety/medical concerns         Wheelchair     Assist Is the patient using a wheelchair?: Yes Type of Wheelchair: Manual    Wheelchair assist level: Moderate Assistance - Patient 50 - 74% Max wheelchair distance: 60ft (Using RUE & RLE)    Wheelchair 50 feet with 2 turns activity    Assist        Assist Level: Moderate Assistance - Patient 50 - 74%   Wheelchair 150 feet activity     Assist      Assist Level: Maximal Assistance - Patient 25 - 49%   Blood pressure 114/71, pulse 77, temperature 98.6 F (37 C), resp. rate 18, height 6\' 2"  (1.88 m), SpO2 100 %.  Medical Problem List and Plan: 1. Functional deficits secondary to subacute right MCA ischemic infarction             -patient may  shower             -ELOS/Goals:  12/27, family hoping to d/c prior to 12/25 -Continue CIR PT/OT/SLP -possible SNF discharge  2.  Antithrombotics: -DVT/anticoagulation:  Pharmaceutical: Eliquis             -antiplatelet therapy: Aspirin 81 mg daily 3. Pain Management: Neurontin 300 mg 3 times daily, reduce nortriptyline to 50mg  mg nightly, Flexeril 10 mg nightly as needed             off gabapentin due to drowsiness no worsening of pain 12/15  -improved on low dose lyrica 25mg  BID for neuropathic pain 4. Mood/Behavior/Sleep: Lexapro 10 mg nightly, nortriptyline.  Provide emotional support             -antipsychotic agents: N/A 5. Neuropsych/cognition: This patient is capable of making decisions on his own behalf. 6. Skin/Wound Care: Routine skin checks 7. Fluids/Electrolytes/Nutrition: Routine in and outs with follow-up chemistries 8.  Atrial fibrillation/AICD.  Continue amiodarone 200 mg daily as well as Eliquis.  Cardiac rate controlled 9.  Diabetes mellitus.  Hemoglobin A1c 9.9.  Semglee 32 units nightly.  SSI. Check blood sugars before meals and at bedtime CBG (last 3)  Recent Labs    06/10/22 2031 06/11/22 0605 06/11/22 1215   GLUCAP 129* 84 71   Reduce semglee 29U qhs- well controlled  12/16 reduce semglee to 27u  12/19 reduce Semglee to 25U 12/20 reduce semglee to 22U Monitor for response to insulin change 12/22 reduce semglee to  18units  10.  Hyperlipidemia.  Lipitor 40mg  daily 11.  Chronic systolic congestive heart failure.  Monitor for any signs of fluid overload. On Furosemide 20mg  daily.  Fluid intake poor will check BMET   12.  BPH.  Hytrin 5 mg twice daily- reduced to 1mg  BID due to soft BP  -  Stop terazosin, monitor bladder function, flomax may be better option if needed 13.  Tobacco use.  Provide counseling 14.  Hypothyroidism.  Synthroid 15.  CKD.  Baseline creatinine 1.43-1.94.  Follow-up chemistries  -Recheck Monday ordered 16. HTN. Long term goal normotensive. Home medications metoprolol and cozaar currently on hold. Monitor with activity. Vitals:   06/10/22 2048 06/11/22 0429  BP: 114/78 114/71  Pulse: 79 77  Resp: 17 18  Temp: 98.5 F (36.9 C) 98.6 F (37 C)  SpO2: 93% 100%  May reduce terazosin 1mg  BID  BMET today  220ml .45 NS bolus on 12/9- ECHO showed EF 60-65%- ok BPs still soft but asymptomatic will change amitriptyline to nortriptyline 12/22 well controlled, stop terazosin  17. Hypothyroidism. Continue synthroid   18.  E coli UTI, completed keflex  19. Orthostatic Hypotension  -stop terazosin due to orthostatic, monitor  LOS: 16 days A FACE TO FACE EVALUATION WAS PERFORMED  Jennye Boroughs 06/11/2022, 2:05 PM

## 2022-06-11 NOTE — Progress Notes (Addendum)
Occupational Therapy Weekly Progress Note  Patient Details  Name: Grant [name] MRN: [id number] Date of Birth: [birth year]  Beginning of progress report period: June 04, 2022 End of progress report period: June 11, 2022  Today's Date: 06/11/2022 OT Individual Time: 1420-1516 OT Individual Time Calculation (min): 56 min    Patient has met 0 of 3 short term goals this reporting period, progressing towards L-side awareness for UB dressing, and requiring revision of static/dynamic preparatory goals due to lack of progress and nearing DC date. Pt currently requires inconsistent levels of assists for ADLs and functional mobility due to fluctuating functional performance. As of this date, pt requires intermediate assistance for self-feeding, Min-Mod A for UB dressing, and Mod-Max A for LB dressing, toileting, and footwear management. Pt performs functional transfers/mobility with Min- Max A + RW and heavy multimodal cuing for motor planning, sequencing, L-side awareness, and safety, level of A fluctuating with physical/cognitive fatigue.Pt improving with integration/use of L-hand during functional tasks.Family education initiated with wife expressing decreased ability to provide a heavy level of A, and treatment team discussing potential need for SNF placement.   Patient continues to demonstrate the following deficits: muscle weakness, decreased cardiorespiratoy endurance, impaired timing and sequencing, motor apraxia, and decreased motor planning, decreased attention to left, decreased awareness, decreased problem solving, decreased safety awareness, and decreased memory, and decreased standing balance, decreased postural control, and decreased balance strategies and therefore will continue to benefit from skilled OT intervention to enhance overall performance with BADL.  Patient not progressing toward long term goals.  See goal revision..  Plan of care revisions:Mod A with LRAD.  Week 2:   OT Short Term Goal 1 (Week 2): Pt will be able to hold static stand balance with no UE support with CGA or less to prep for LB dressing. OT Short Term Goal 1 - Progress (Week 2): Revised due to lack of progress OT Short Term Goal 2 (Week 2): Pt will be able pull pants over hips with min A using 1 hand support on grab bar or rail. OT Short Term Goal 2 - Progress (Week 2): Revised due to lack of progress OT Short Term Goal 3 (Week 2): Pt will demostrate improved L side coordination to don a overhead shirt with supervision. OT Short Term Goal 3 - Progress (Week 2): Progressing toward goal  Skilled Therapeutic Interventions/Progress Updates:  Pt received resting in [facility] for skilled OT session with focus on ADL retraining. Pt agreeable to interventions, despite overall fatigue/sleepiness. Pt with no reports of pain. OT offering intermediate rest breaks and positioning suggestions throughout session to address potential pain/fatigue and maximize participation/safety in session.   Pt ambulates into/out off bathroom with overall Min-Mod A + RW and heavy verbal cuing for awareness to L-side. Pt needing two trials to ambulate out of bathroom due to decreased L-side awareness and motor planning difficulties. Pt performs toilet transfer with Min A + heavy cuing for sequencing of movements. Pt requires A for doffing of brief/pants and thoroughness of peri-care. Pt initiating peri-care with L-hand without cuing.   Pt takes seated rest break at bed before walking from EOB>sink. Pt ambulating towards sink with Min A+RW, standing to wash hands. Pt requiring Max A with heavy multimodal cuing to maintain stance at sink to wash hands. Pt then needing Mod-Max A to initiate turn and walk towards bed.   Pt performing bed mobility with min cues, able to boost self with use of R-arm and BLEs.   Pt remained resting  in bed with all immediate needs met and bed alarm activated at end of session. Pt continues to be appropriate for  skilled OT intervention to promote further functional independence.   Therapy Documentation Precautions:  Precautions Precautions: Fall Precaution Comments: LT wrist pain with WB (fall 2 months ago on hand. No imaging) Restrictions Weight Bearing Restrictions: No   Therapy/Group: Individual Therapy  Maudie Mercury, OTR/L, MSOT  06/11/2022, 6:30 AM

## 2022-06-11 NOTE — Progress Notes (Signed)
Physical Therapy Weekly Progress Note  Patient Details  Name: Grant [name] MRN: [id number] Date of Birth: [birth year]  Beginning of progress report period: {Time; dates multiple:304500300} End of progress report period: {Time; dates multiple:304500300}  Today's Date: 06/11/2022 PT Individual Time: 1107-1205 PT Individual Time Calculation (min): 58 min   Patient has met {number 1-5:22450} of {number 1-5:20334} short term goals.  ***  Patient continues to demonstrate the following deficits {impairments:3041632} and therefore will continue to benefit from skilled PT intervention to increase functional independence with mobility.  Patient {LTG progression:3041653}.  {plan of KHTX:7741423}  PT Short Term Goals {TRV:2023343}  Skilled Therapeutic Interventions/Progress Updates:  Patient supine in bed on entrance to room. Asleep and fatigued. Difficult to rouse and stay awake. Patient requires time and encouragement to maintain alertness. Then agreeable to PT session.   Patient with no pain complaint at start of session.  Therapeutic Activity: Bed Mobility: Pt performed supine <> sit with ***. VC/ tc required for ***. Transfers: Pt performed sit<>stand and stand pivot transfers throughout session with ***. Provided verbal cues for***.  Gait Training:  Pt ambulated *** ft using *** with ***. Demonstrated ***. Provided vc/ tc for ***.  Wheelchair Mobility:  Pt propelled wheelchair *** feet with ***. Provided vc for ***.  Neuromuscular Re-ed: NMR facilitated during session with focus on***. Pt guided in ***. NMR performed for improvements in motor control and coordination, balance, sequencing, judgement, and self confidence/ efficacy in performing all aspects of mobility at highest level of independence.   Therapeutic Exercise: Pt performed the following exercises with vc/ tc for proper technique. ***  Patient *** at end of session with brakes locked, *** alarm set, and all  needs within reach.  -bed mobility - posterior bias, extra time.  - squat pivot transfer bed--> w/c, w/c <>mat table - attempted pins to basketball goal - attempted touch to net - attempted STS from mat table with no UE or assist.   Therapy Documentation Precautions:  Precautions Precautions: Fall Precaution Comments: LT wrist pain with WB (fall 2 months ago on hand. No imaging) Restrictions Weight Bearing Restrictions: No General:   Vital Signs: Therapy Vitals Patient Position (if appropriate): Orthostatic Vitals Pain:   Vision/Perception     Mobility:   Locomotion :    Trunk/Postural Assessment :    Balance:   Exercises:   Other Treatments:     Therapy/Group: {Therapy/Group:3049007}  Grant [name] 06/11/2022, 11:05 AM

## 2022-06-11 NOTE — Progress Notes (Signed)
Physical Therapy Session Note  Patient Details  Name: Grant [name] MRN: [id number] Date of Birth: 04/21/1946  Today's Date: 06/11/2022 PT Individual Time: 9381-0175 PT Individual Time Calculation (min): 71 min   Short Term Goals: Week 1:  PT Short Term Goal 1 (Week 1): Pt wil perfrom bed/chair transfers w/ supervison assist using LRAD PT Short Term Goal 1 - Progress (Week 1): Not met PT Short Term Goal 2 (Week 1): Pt ambulate 75 ft w/ supervsion assist using LRAD PT Short Term Goal 2 - Progress (Week 1): Not met PT Short Term Goal 3 (Week 1): Pt will naviagte 4 6 inch steps w/ min A while using hand rails PT Short Term Goal 3 - Progress (Week 1): Not met PT Short Term Goal 4 (Week 1): Pt wil perfrom  functional otucome measure to predict fall risk PT Short Term Goal 4 - Progress (Week 1): Met PT Short Term Goal 5 (Week 1): Pt will perfrom bed mobility at supervison level conistently PT Short Term Goal 5 - Progress (Week 1): Not met Week 2:  PT Short Term Goal 1 (Week 2): Pt will trasnfer to [facility] with CGA consistently PT Short Term Goal 2 (Week 2): Pt will ambulate 61f with CGA and no LOB PT Short Term Goal 3 (Week 2): Pt will tolerate standing >3 mintues to imrpove safety with ADLS and CGA Week 3:    Week 4:     Skilled Therapeutic Interventions/Progress Updates:   Pt received supine in bed and agreeable to PT. Supine>sit transfer with mod assist and max cues for midline to prevent posterior bias.   Sit<>stand at EOB with RW and  mod assist and heavy posterior bias. Pt attempting to initiate ambulation to bathroom with total lack of awareness of constant LOB, requiring mod assist to return to sitting EOB.   [facility]   Hy[facility] with assist from PT.  [facility].  [facility] See VS. Min assist for all tranfers.   Pt performed 5 time sit<>stand (5xSTS): 45.5 sec (>15 sec indicates increased fall risk)     Pt returned to room and performed ** transfer to bed with **. Sit>supine completed with ** and left supine in bed with call bell in reach and all needs met.            Therapy Documentation Precautions:  Precautions Precautions: Fall Precaution Comments: LT wrist pain with WB (fall 2 months ago on hand. No imaging) Restrictions Weight Bearing Restrictions: No General:   Vital Signs: Therapy Vitals Patient Position (if appropriate): Orthostatic Vitals Supine: 124/81 HR 78 Sitting:93/67 HR 85 Standing 78/66. HR 98 Pain:   Mobility:   Locomotion :    Trunk/Postural Assessment :    Balance:   Exercises:   Other Treatments:      Therapy/Group: Individual Therapy  ALorie Phenix12/22/2023, 10:02 AM

## 2022-06-12 LAB — GLUCOSE, CAPILLARY
Glucose-Capillary: 67 mg/dL — ABNORMAL LOW (ref 70–99)
Glucose-Capillary: 79 mg/dL (ref 70–99)
Glucose-Capillary: 88 mg/dL (ref 70–99)
Glucose-Capillary: 80 mg/dL (ref 70–99)
Glucose-Capillary: 105 mg/dL — ABNORMAL HIGH (ref 70–99)

## 2022-06-12 MED ORDER — INSULIN GLARGINE-YFGN 100 UNIT/ML ~~LOC~~ SOLN
17.0000 [IU] | Freq: Every day | SUBCUTANEOUS | Status: DC
  Administered 2022-06-12: 17 [IU] via SUBCUTANEOUS
  Filled 2022-06-12 (×2): qty 0.17

## 2022-06-12 NOTE — Progress Notes (Signed)
PROGRESS NOTE   Subjective/Complaints: No new complaints today  ROS- denies CP, SOB, N/V/D, abdominal pain, HA, cough, dizziness    Objective:   No results found. No results for input(s): "WBC", "HGB", "HCT", "PLT" in the last 72 hours.  No results for input(s): "NA", "K", "CL", "CO2", "GLUCOSE", "BUN", "CREATININE", "CALCIUM" in the last 72 hours.   Intake/Output Summary (Last 24 hours) at 06/12/2022 1534 Last data filed at 06/12/2022 0700 Gross per 24 hour  Intake 355 ml  Output --  Net 355 ml        Physical Exam: Vital Signs Blood pressure (!) 127/98, pulse 77, temperature 97.7 F (36.5 C), temperature source Oral, resp. rate 17, height 6\' 2"  (1.88 m), SpO2 98 %.  Gen: no distress, normal appearing HEENT: oral mucosa pink and moist, NCAT Cardio: Reg rate Chest: normal effort, normal rate of breathing Abd: soft, non-distended Ext: no edema Psych: pleasant, normal affect Skin: intact Abdomen: Positive bowel sounds, soft nontender to palpation, nondistended Extremities: No clubbing, cyanosis, or edema Skin: No evidence of breakdown, no evidence of rash, warm and dry Neurologic: Cranial nerves II through XII grossly intact, motor strength is 4/5 in bilateral deltoid, bicep, tricep, grip, hip flexor, knee extensors, ankle dorsiflexor and plantar flexor Sensory exam reduced  sensation to light touch  in right upper and left , has intact proprioception LLE Cerebellar exam normal finger to nose to finger as well as heel to shin in right  upper and lower extremities Dysmetria LUE and LLE Musculoskeletal: right hand intrinsic atrophy    Assessment/Plan: 1. Functional deficits which require 3+ hours per day of interdisciplinary therapy in a comprehensive inpatient rehab setting. Physiatrist is providing close team supervision and 24 hour management of active medical problems listed below. Physiatrist and rehab team  continue to assess barriers to discharge/monitor patient progress toward functional and medical goals  Care Tool:  Bathing    Body parts bathed by patient: Chest, Abdomen, Right upper leg, Left upper leg, Buttocks, Face, Left arm   Body parts bathed by helper: Front perineal area, Left lower leg, Right lower leg, Right arm     Bathing assist Assist Level: Moderate Assistance - Patient 50 - 74%     Upper Body Dressing/Undressing Upper body dressing   What is the patient wearing?: Pull over shirt    Upper body assist Assist Level: Moderate Assistance - Patient 50 - 74%    Lower Body Dressing/Undressing Lower body dressing      What is the patient wearing?: Underwear/pull up, Pants     Lower body assist Assist for lower body dressing: Maximal Assistance - Patient 25 - 49%     Toileting Toileting    Toileting assist Assist for toileting: Moderate Assistance - Patient 50 - 74%     Transfers Chair/bed transfer  Transfers assist     Chair/bed transfer assist level: Minimal Assistance - Patient > 75%     Locomotion Ambulation   Ambulation assist      Assist level: Minimal Assistance - Patient > 75% Assistive device: Walker-rolling (required assitance w/ maintaing left hand on walker and RW navigation) Max distance: 32ft   Walk 10 feet  activity   Assist     Assist level: Minimal Assistance - Patient > 75% (required assitance w/ maintaing left hand on walker and RW navigation) Assistive device: Walker-rolling   Walk 50 feet activity   Assist    Assist level: Minimal Assistance - Patient > 75% (required assitance w/ maintaing left hand on walker and RW navigation) Assistive device: Walker-rolling    Walk 150 feet activity   Assist Walk 150 feet activity did not occur: Safety/medical concerns         Walk 10 feet on uneven surface  activity   Assist Walk 10 feet on uneven surfaces activity did not occur: Safety/medical concerns          Wheelchair     Assist Is the patient using a wheelchair?: Yes Type of Wheelchair: Manual    Wheelchair assist level: Moderate Assistance - Patient 50 - 74% Max wheelchair distance: 46ft (Using RUE & RLE)    Wheelchair 50 feet with 2 turns activity    Assist        Assist Level: Moderate Assistance - Patient 50 - 74%   Wheelchair 150 feet activity     Assist      Assist Level: Maximal Assistance - Patient 25 - 49%   Blood pressure (!) 127/98, pulse 77, temperature 97.7 F (36.5 C), temperature source Oral, resp. rate 17, height 6\' 2"  (1.88 m), SpO2 98 %.  Medical Problem List and Plan: 1. Functional deficits secondary to subacute right MCA ischemic infarction             -patient may  shower             -ELOS/Goals:  12/27, family hoping to d/c prior to 12/25 Continue CIR PT/OT/SLP -possible SNF discharge  2.  Antithrombotics: -DVT/anticoagulation:  Pharmaceutical: Eliquis             -antiplatelet therapy: Aspirin 81 mg daily 3. Pain Management: continue Neurontin 300 mg 3 times daily, reduce nortriptyline to 50mg  mg nightly, Flexeril 10 mg nightly as needed             off gabapentin due to drowsiness no worsening of pain 12/15  -improved on low dose lyrica 25mg  BID for neuropathic pain 4. Depression: continue Lexapro 10 mg nightly, nortriptyline.  Provide emotional support             -antipsychotic agents: N/A 5. Neuropsych/cognition: This patient is capable of making decisions on his own behalf. 6. Skin/Wound Care: Routine skin checks 7. Fluids/Electrolytes/Nutrition: Routine in and outs with follow-up chemistries 8.  Atrial fibrillation/AICD.  Continue amiodarone 200 mg daily as well as Eliquis.  Cardiac rate controlled 9.  Diabetes mellitus.  Hemoglobin A1c 9.9.  Decrease semglee to 17U.  SSI. Check blood sugars before meals and at bedtime CBG (last 3)  Recent Labs    06/12/22 0632 06/12/22 0712 06/12/22 1202  GLUCAP 67* 79 88    10.   Hyperlipidemia. continue Lipitor 40mg  daily 11.  Chronic systolic congestive heart failure.  Monitor for any signs of fluid overload. On Furosemide 20mg  daily.  Fluid intake poor will check BMET   12.  BPH.  Hytrin 5 mg twice daily- reduced to 1mg  BID due to soft BP  -Stop terazosin, monitor bladder function, flomax may be better option if needed 13.  Tobacco use.  Provide counseling 14.  Hypothyroidism.  Synthroid 15.  CKD.  Baseline creatinine 1.43-1.94.  Follow-up chemistries  -Recheck Monday ordered 16. HTN. Long term goal  normotensive. Home medications metoprolol and cozaar currently on hold. Monitor with activity. Vitals:   06/12/22 0452 06/12/22 1257  BP: 131/79 (!) 127/98  Pulse: 75 77  Resp: 16 17  Temp: 97.6 F (36.4 C) 97.7 F (36.5 C)  SpO2: 100% 98%  May reduce terazosin 1mg  BID  BMET today  264ml .45 NS bolus on 12/9- ECHO showed EF 60-65%- ok BPs still soft but asymptomatic will change amitriptyline to nortriptyline 12/22 well controlled, stop terazosin  17. Hypothyroidism. Continue synthroid   18.  E coli UTI, completed keflex  19. Orthostatic Hypotension  -stop terazosin due to orthostatic, monitor  LOS: 17 days A FACE TO FACE EVALUATION WAS PERFORMED  Grant [name] 06/12/2022, 3:34 PM

## 2022-06-13 LAB — GLUCOSE, CAPILLARY
Glucose-Capillary: 73 mg/dL (ref 70–99)
Glucose-Capillary: 97 mg/dL (ref 70–99)
Glucose-Capillary: 88 mg/dL (ref 70–99)
Glucose-Capillary: 74 mg/dL (ref 70–99)
Glucose-Capillary: 109 mg/dL — ABNORMAL HIGH (ref 70–99)

## 2022-06-13 MED ORDER — INSULIN GLARGINE-YFGN 100 UNIT/ML ~~LOC~~ SOLN
16.0000 [IU] | Freq: Every day | SUBCUTANEOUS | Status: DC
  Administered 2022-06-13: 16 [IU] via SUBCUTANEOUS
  Filled 2022-06-13 (×2): qty 0.16

## 2022-06-13 NOTE — Progress Notes (Signed)
Occupational Therapy Session Note  Patient Details  Name: Grant [name] MRN: [id number] Date of Birth: Sep 04, 1945  Today's Date: 06/13/2022 OT Individual Time: 9030-0923 OT Individual Time Calculation (min): 75 min    Short Term Goals: Week 1:  OT Short Term Goal 1 (Week 1): Pt will demonstrate improved LUE coordination to don shirt with min A. OT Short Term Goal 1 - Progress (Week 1): Met OT Short Term Goal 2 (Week 1): Pt will demonstrate improved dynamic standing balance to pull pants over hips with mod A or less to support balance OT Short Term Goal 2 - Progress (Week 1): Met OT Short Term Goal 3 (Week 1): Pt will complete RW transfers to toilet with CGA or less. OT Short Term Goal 3 - Progress (Week 1): Progressing toward goal OT Short Term Goal 4 (Week 1): Pt will demonstrate improved L side awareness during bathing to wash UB with min cues to attend to L arm. OT Short Term Goal 4 - Progress (Week 1): Met Week 2:  OT Short Term Goal 1 (Week 2): Pt will be able to hold static stand balance with no UE support with CGA or less to prep for LB dressing. OT Short Term Goal 1 - Progress (Week 2): Revised due to lack of progress OT Short Term Goal 2 (Week 2): Pt will be able pull pants over hips with min A using 1 hand support on grab bar or rail. OT Short Term Goal 2 - Progress (Week 2): Revised due to lack of progress OT Short Term Goal 3 (Week 2): Pt will demostrate improved L side coordination to don a overhead shirt with supervision. OT Short Term Goal 3 - Progress (Week 2): Progressing toward goal Week 3:  OT Short Term Goal 1 (Week 3): STGs=LTGs due to pt's length of stay.     Skilled Therapeutic Interventions/Progress Updates:    Pt received in bed dressed. Pt declined a bath today.  Donned new knee high TEDs and non slip socks. Pt stated he get out of bed on L side at home. Placed bed flat and had pt roll to L with guiding A.  CGA to keep his torso in rotation to allow him  to move to sit.  Once sitting pt leaning back and needed mod cues and min A to stabilize sit balance.  Worked on sit to stands from EOB with a focus on postural control with cues to shift forward.  Pt continues to put weight on heels.  With cues, he was able shift forward and stand with CGA with RW. He then took steps to wc and sat with improved control.  Pt positioned at sink to complete oral care and clean his dentures.  Pt did well with this task demonstrating improved L hand coordination and did not need any A from therapist.  Pt taken to gym and used RW to transfer to mat with min to CGA due to improved postural control.  From mat worked on sit to stand control and standing balance with weight shifts ant/post and laterally. Pt held balance with UE support with CGA.   Then pt worked on alternating arm reached to knees to simulate pulling up pants and alternating reaching forward. Using L hand pt did well with coordination activity of reaching for cones and stacking with no dropping of cones.  Pt was then able to ambulate from gym to his room with RW with min A using good postural control keeping walker in alignment with  his body.    Pt did get very fatigued towards end of walk.  Pt resting in wc with belt alarm on and all needs met.     Therapy Documentation Precautions:  Precautions Precautions: Fall Precaution Comments: LT wrist pain with WB (fall 2 months ago on hand. No imaging) Restrictions Weight Bearing Restrictions: Yes    Vital Signs: Therapy Vitals Temp: 97.7 F (36.5 C) Temp Source: Oral Pulse Rate: 72 Resp: 18 BP: 109/76 Patient Position (if appropriate): Lying Oxygen Therapy SpO2: 95 % O2 Device: Room Air Pain:   ADL: ADL Eating: Set up Grooming: Setup Upper Body Bathing: Supervision/safety, Minimal cueing (using wash mit on L hand) Where Assessed-Upper Body Bathing: Shower Lower Body Bathing: Minimal assistance (using long handled sponge) Where Assessed-Lower  Body Bathing: Shower Upper Body Dressing: Minimal assistance, Moderate cueing Where Assessed-Upper Body Dressing: Wheelchair Lower Body Dressing: Moderate assistance, Moderate cueing Where Assessed-Lower Body Dressing: Wheelchair Toileting: Maximal assistance Where Assessed-Toileting: Toilet Toilet Transfer: Moderate assistance Toilet Transfer Method: Stand pivot   Therapy/Group: Individual Therapy  SAGUIER,JULIA 06/13/2022, 8:00 AM 

## 2022-06-13 NOTE — Progress Notes (Signed)
Physical Therapy Session Note  Patient Details  Name: Grant [name] MRN: [id number] Date of Birth: 07/22/1945  Today's Date: 06/13/2022 PT Individual Time: 2229-7989 PT Individual Time Calculation (min): 70 min   Short Term Goals: Week 2:  PT Short Term Goal 1 (Week 2): Pt will trasnfer to [facility] with CGA consistently PT Short Term Goal 1 - Progress (Week 2): Not met PT Short Term Goal 2 (Week 2): Pt will ambulate 52f with CGA and no LOB PT Short Term Goal 2 - Progress (Week 2): Not met PT Short Term Goal 3 (Week 2): Pt will tolerate standing >3 mintues to imrpove safety with ADLS and CGA PT Short Term Goal 3 - Progress (Week 2): Not met Week 3:  PT Short Term Goal 1 (Week 3): Pt will ambulate 766fwith CGA and no LOB PT Short Term Goal 2 (Week 3): [facility] PT Short Term Goal 3 (Week 3): Pt will tolerate standing >3 mintues to imrpove safety with ADLS and CGA  Skilled Therapeutic Interventions/Progress Updates:  Patient seated upright with tilt back in TIS w/c on entrance to room. Patient alert and agreeable to PT session.   Patient with no pain complaint at start of session.  Therapeutic Activity: Transfers: Pt performed sit<>stand and stand pivot transfers throughout session with CGA/ intermittent MinA d/t impulsivity and balance. L bias in mobility. Provided verbal cues for maintaining good proximity and placement to RW, upright posture, level gaze.  Gait Training/ NMR:  Pt participates in circuit with 5 stations for balance and mobility requiring ambulation to each station, performance and then return ambulation with no seated rest break until return to w/c. Ambulated various distances from 1519to 4573with same return distance after performing activity. Pt performs Bil UE extensor chest push with 4# weighted ball x10, then unweighted ball picked up from mid-shin height and touch to target x5 with ball prior to return of ball to box. Performs  x5. Ambulates to station with dynamic stepping to targets on wall. Attempt with no UE support but RW in front of pt for prn use. Decreasing need for RW with practice. Performs 12 reaches to target. Next station with pot-stirrers using 3# weighted ball. Last station with toe touches to 6' step using RW for support x20 then attempt for 3 toe touches with each LE and no AD. Difficulty maintaining full balance but does complete.   Throughout pt demos intermittent step outside of RW with LLE but minimal LOB. Increasing Bil knee flexion with fatigue requiring vc/ tc for improving upright positioning.   NMR performed for improvements in motor control and coordination, balance, sequencing, judgement, and self confidence/ efficacy in performing all aspects of mobility at highest level of independence.   Patient seated upright in TIS w/c with slight back tilt for safety at end of session with brakes locked, belt alarm set, and all needs within reach. At this time, pt understanding of need for assist with ambulation and mobility d/t decreased balance.   Therapy Documentation Precautions:  Precautions Precautions: Fall Precaution Comments: LT wrist pain with WB (fall 2 months ago on hand. No imaging) Restrictions Weight Bearing Restrictions: Yes General:   Vital Signs: Therapy Vitals Temp: 98.2 F (36.8 C) Temp Source: Oral Pulse Rate: 81 Resp: 16 BP: 105/80 Patient Position (if appropriate): Sitting Oxygen Therapy SpO2: 100 % O2 Device: Room Air Pain:  No pain related this session.   Therapy/Group: Individual Therapy  JuAlger Simons  PT, DPT, CSRS 06/13/2022, 5:17 PM

## 2022-06-13 NOTE — Progress Notes (Signed)
This morning during medication administration patient attempting to climb over the left rail of his bed. Is disoriented, confused, and presenting with some irritability this morning. Difficult to redirect. Needing multiple prompts by Clinical research associate for him to follow treatment plan for safety. Attempting to stand up and not wait for staff to assist him with mobility. Refusing to use the STEADY and using the rolling walker. While using the rolling walker unsteady gait and leaning forward. Assistance of 2 able to bring patient to the toilet. Refusing to use the walker and writer attempting multiple times to reason with him about importance of using mobility device is important for his safety. Moving his left hand to push writer away. Will pass along information to next shift.

## 2022-06-13 NOTE — Progress Notes (Signed)
Speech Language Pathology Daily Session Note  Patient Details  Name: Grant [name] MRN: [id number] Date of Birth: [birth year]  Today's Date: 06/13/2022 SLP Individual Time: 1791-5056 SLP Individual Time Calculation (min): 40 min  Short Term Goals: Week 3: SLP Short Term Goal 1 (Week 3): STG=LTG due to ELOS  Skilled Therapeutic Interventions: Skilled ST treatment focused on cognitive goals. Pt greeted in TIS w/c on arrival and agreeable to ST intervention. SLP took pt to quiet atrium area per pt request to leave the room for therapy. SLP facilitated a mild-to-moderately complex problem solving task with overall max A verbal/visual cues to understand concept. SLP reduced complexity and facilitated a mild problem solving and verbal reasoning task with overall min A verbal cues. SLP then facilitated abstract reasoning by ID similarities of various concepts with min-to-mod A verbal cues. Pt attended to duration of session within a mildly distracting environment with minimal redirection cues necessary. Patient was left in TIS w/c with alarm activated and immediate needs within reach at end of session. Continue per current plan of care.      Pain Pain Assessment Pain Score: 0-No pain  Therapy/Group: Individual Therapy  Tamala Ser 06/13/2022, 10:47 AM

## 2022-06-13 NOTE — Progress Notes (Signed)
PROGRESS NOTE   Subjective/Complaints: No new complaints Sleepy Seen walking with therapy today Had tried to climb over the left rail.   ROS- denies CP, SOB, N/V/D, abdominal pain, HA, cough, dizziness    Objective:   No results found. No results for input(s): "WBC", "HGB", "HCT", "PLT" in the last 72 hours.  No results for input(s): "NA", "K", "CL", "CO2", "GLUCOSE", "BUN", "CREATININE", "CALCIUM" in the last 72 hours.   Intake/Output Summary (Last 24 hours) at 06/13/2022 1009 Last data filed at 06/13/2022 0839 Gross per 24 hour  Intake 840 ml  Output --  Net 840 ml        Physical Exam: Vital Signs Blood pressure 109/76, pulse 72, temperature 97.7 F (36.5 C), temperature source Oral, resp. rate 18, height 6\' 2"  (1.88 m), SpO2 95 %.  Gen: no distress, normal appearing HEENT: oral mucosa pink and moist, NCAT Cardio: Reg rate Chest: normal effort, normal rate of breathing Abd: soft, non-distended Extremities: No clubbing, cyanosis, or edema Skin: No evidence of breakdown, no evidence of rash, warm and dry Neurologic: Cranial nerves II through XII grossly intact, motor strength is 4/5 in bilateral deltoid, bicep, tricep, grip, hip flexor, knee extensors, ankle dorsiflexor and plantar flexor Sensory exam reduced  sensation to light touch  in right upper and left , has intact proprioception LLE Cerebellar exam normal finger to nose to finger as well as heel to shin in right  upper and lower extremities Dysmetria LUE and LLE Musculoskeletal: right hand intrinsic atrophy    Assessment/Plan: 1. Functional deficits which require 3+ hours per day of interdisciplinary therapy in a comprehensive inpatient rehab setting. Physiatrist is providing close team supervision and 24 hour management of active medical problems listed below. Physiatrist and rehab team continue to assess barriers to discharge/monitor patient  progress toward functional and medical goals  Care Tool:  Bathing    Body parts bathed by patient: Chest, Abdomen, Right upper leg, Left upper leg, Buttocks, Face, Left arm   Body parts bathed by helper: Front perineal area, Left lower leg, Right lower leg, Right arm     Bathing assist Assist Level: Moderate Assistance - Patient 50 - 74%     Upper Body Dressing/Undressing Upper body dressing   What is the patient wearing?: Pull over shirt    Upper body assist Assist Level: Moderate Assistance - Patient 50 - 74%    Lower Body Dressing/Undressing Lower body dressing      What is the patient wearing?: Underwear/pull up, Pants     Lower body assist Assist for lower body dressing: Maximal Assistance - Patient 25 - 49%     Toileting Toileting    Toileting assist Assist for toileting: Moderate Assistance - Patient 50 - 74%     Transfers Chair/bed transfer  Transfers assist     Chair/bed transfer assist level: Minimal Assistance - Patient > 75%     Locomotion Ambulation   Ambulation assist      Assist level: Minimal Assistance - Patient > 75% Assistive device: Walker-rolling (required assitance w/ maintaing left hand on walker and RW navigation) Max distance: 31ft   Walk 10 feet activity   Assist  Assist level: Minimal Assistance - Patient > 75% (required assitance w/ maintaing left hand on walker and RW navigation) Assistive device: Walker-rolling   Walk 50 feet activity   Assist    Assist level: Minimal Assistance - Patient > 75% (required assitance w/ maintaing left hand on walker and RW navigation) Assistive device: Walker-rolling    Walk 150 feet activity   Assist Walk 150 feet activity did not occur: Safety/medical concerns         Walk 10 feet on uneven surface  activity   Assist Walk 10 feet on uneven surfaces activity did not occur: Safety/medical concerns         Wheelchair     Assist Is the patient using a  wheelchair?: Yes Type of Wheelchair: Manual    Wheelchair assist level: Moderate Assistance - Patient 50 - 74% Max wheelchair distance: 41ft (Using RUE & RLE)    Wheelchair 50 feet with 2 turns activity    Assist        Assist Level: Moderate Assistance - Patient 50 - 74%   Wheelchair 150 feet activity     Assist      Assist Level: Maximal Assistance - Patient 25 - 49%   Blood pressure 109/76, pulse 72, temperature 97.7 F (36.5 C), temperature source Oral, resp. rate 18, height 6\' 2"  (1.88 m), SpO2 95 %.  Medical Problem List and Plan: 1. Functional deficits secondary to subacute right MCA ischemic infarction             -patient may  shower             -ELOS/Goals:  12/27, family hoping to d/c prior to 12/25 Continue CIR PT/OT/SLP -possible SNF discharge 2.  Antithrombotics: -DVT/anticoagulation:  Pharmaceutical: Eliquis             -antiplatelet therapy: Aspirin 81 mg daily 3. Pain Management: continue Neurontin 300 mg 3 times daily, reduce nortriptyline to 50mg  mg nightly, Flexeril 10 mg nightly as needed             off gabapentin due to drowsiness no worsening of pain 12/15  -improved on low dose lyrica 25mg  BID for neuropathic pain 4. Depression: continue Lexapro 10 mg nightly, nortriptyline.  Provide emotional support             -antipsychotic agents: N/A 5. Neuropsych/cognition: This patient is capable of making decisions on his own behalf. 6. Skin/Wound Care: Routine skin checks 7. Fluids/Electrolytes/Nutrition: Routine in and outs with follow-up chemistries 8.  Atrial fibrillation/AICD.  Continue amiodarone 200 mg daily as well as Eliquis.  Cardiac rate controlled 9.  Diabetes mellitus.  Hemoglobin A1c 9.9.  Decrease semglee to 16U.  SSI. Check blood sugars before meals and at bedtime CBG (last 3)  Recent Labs    06/12/22 2047 06/13/22 0632 06/13/22 0926  GLUCAP 105* 73 97    10.  Hyperlipidemia. continue Lipitor 40mg  daily 11.  Chronic  systolic congestive heart failure.  Monitor for any signs of fluid overload. On Furosemide 20mg  daily.  Fluid intake poor will check BMET   12.  BPH.  Hytrin 5 mg twice daily- reduced to 1mg  BID due to soft BP  -Stop terazosin, monitor bladder function, flomax may be better option if needed 13.  Tobacco use.  Provide counseling 14.  Hypothyroidism.  Synthroid 15.  CKD.  Baseline creatinine 1.43-1.94.  Follow-up chemistries  -Recheck Monday ordered 16. HTN. Long term goal normotensive. Home medications metoprolol and cozaar currently on hold. Monitor  with activity. Vitals:   06/12/22 2004 06/13/22 0635  BP: 122/81 109/76  Pulse: 76 72  Resp: 16 18  Temp: 97.9 F (36.6 C) 97.7 F (36.5 C)  SpO2: 100% 95%  May reduce terazosin 1mg  BID  BMET today  .45 NS bolus on 12/9- ECHO showed EF 60-65%- ok BPs still soft but asymptomatic will change amitriptyline to nortriptyline 12/22 well controlled, stop terazosin  17. Hypothyroidism. Continue synthroid   18.  E coli UTI, completed keflex  19. Orthostatic Hypotension  -stop terazosin due to orthostatic, monitor  LOS: 18 days A FACE TO FACE EVALUATION WAS PERFORMED  Silena Wyss P Kemi Gell 06/13/2022, 10:09 AM

## 2022-06-14 ENCOUNTER — Inpatient Hospital Stay (HOSPITAL_COMMUNITY): Payer: No Typology Code available for payment source

## 2022-06-14 DIAGNOSIS — N390 Urinary tract infection, site not specified: Secondary | ICD-10-CM

## 2022-06-14 DIAGNOSIS — N401 Enlarged prostate with lower urinary tract symptoms: Secondary | ICD-10-CM

## 2022-06-14 LAB — GLUCOSE, CAPILLARY
Glucose-Capillary: 78 mg/dL (ref 70–99)
Glucose-Capillary: 79 mg/dL (ref 70–99)
Glucose-Capillary: 83 mg/dL (ref 70–99)
Glucose-Capillary: 114 mg/dL — ABNORMAL HIGH (ref 70–99)
Glucose-Capillary: 96 mg/dL (ref 70–99)

## 2022-06-14 LAB — BASIC METABOLIC PANEL
Anion gap: 8 (ref 5–15)
BUN: 17 mg/dL (ref 8–23)
CO2: 25 mmol/L (ref 22–32)
Calcium: 9 mg/dL (ref 8.9–10.3)
Chloride: 103 mmol/L (ref 98–111)
Creatinine, Ser: 1.46 mg/dL — ABNORMAL HIGH (ref 0.61–1.24)
GFR, Estimated: 49 mL/min — ABNORMAL LOW (ref >60)
Glucose, Bld: 114 mg/dL — ABNORMAL HIGH (ref 70–99)
Potassium: 4.6 mmol/L (ref 3.5–5.1)
Sodium: 136 mmol/L (ref 135–145)

## 2022-06-14 LAB — URINALYSIS, ROUTINE W REFLEX MICROSCOPIC
Bilirubin Urine: NEGATIVE
Glucose, UA: NEGATIVE mg/dL
Hgb urine dipstick: NEGATIVE
Ketones, ur: NEGATIVE mg/dL
Nitrite: NEGATIVE
Protein, ur: NEGATIVE mg/dL
Specific Gravity, Urine: 1.013 (ref 1.005–1.030)
WBC, UA: >50 WBC/hpf — ABNORMAL HIGH (ref 0–5)
pH: 6 (ref 5.0–8.0)

## 2022-06-14 LAB — CBC
HCT: 40.2 % (ref 39.0–52.0)
Hemoglobin: 13 g/dL (ref 13.0–17.0)
MCH: 29.5 pg (ref 26.0–34.0)
MCHC: 32.3 g/dL (ref 30.0–36.0)
MCV: 91.2 fL (ref 80.0–100.0)
Platelets: 234 10*3/uL (ref 150–400)
RBC: 4.41 MIL/uL (ref 4.22–5.81)
RDW: 13.2 % (ref 11.5–15.5)
WBC: 6 10*3/uL (ref 4.0–10.5)
nRBC: 0 % (ref 0.0–0.2)

## 2022-06-14 MED ORDER — INSULIN GLARGINE-YFGN 100 UNIT/ML ~~LOC~~ SOLN
12.0000 [IU] | Freq: Every day | SUBCUTANEOUS | Status: DC
  Administered 2022-06-14 – 2022-06-16 (×3): 12 [IU] via SUBCUTANEOUS
  Filled 2022-06-14 (×5): qty 0.12

## 2022-06-14 NOTE — Progress Notes (Signed)
Pt refusing to follow safety precautions this AM. Pt standing up by self without staff present. Pt redirected multiple times. Pt verbalizes need for bathroom but refuses when staff attempt to assist. Pt incontinent and is refusing incontinence cares. Pt encouraged to use call bell for assistance. Pt refusing to eat at this time. Pt CBG 78 this AM. Pt sitting on edge of bed, gripper socks on, bed alarm on and in lowest position, fall matt in place, call light in reach, and tele sitter at bedside.  Mylo Red, LPN

## 2022-06-14 NOTE — Progress Notes (Addendum)
PROGRESS NOTE   Subjective/Complaints: Pt noted to not be following safety precautions yesterday and this AM. He attempted to go to bathroom without calling staff.  This AM he reports he feels well overall. No concerns elicited.   ROS- denies CP, SOB, N/V/D, abdominal pain, HA, cough, dizziness, dysuria   Objective:   DG Chest 1 View  Result Date: 06/14/2022 CLINICAL DATA:  Altered mental status EXAM: CHEST  1 VIEW COMPARISON:  05/29/2022 FINDINGS: Cardiac size is within normal limits. Pacemaker/defibrillator battery is seen in left infraclavicular region. Lung fields are clear of any infiltrates or pulmonary edema. There is no pleural effusion or pneumothorax. There are surgical clips at the gastroesophageal junction. Metallic sutures are noted overlying the cardiac shadow, possibly in the lower sternum. IMPRESSION: There are no signs of pulmonary edema or focal pulmonary consolidation. Electronically Signed   By: Elmer Picker M.D.   On: 06/14/2022 10:54   No results for input(s): "WBC", "HGB", "HCT", "PLT" in the last 72 hours.  No results for input(s): "NA", "K", "CL", "CO2", "GLUCOSE", "BUN", "CREATININE", "CALCIUM" in the last 72 hours.   Intake/Output Summary (Last 24 hours) at 06/14/2022 1602 Last data filed at 06/14/2022 1345 Gross per 24 hour  Intake 420 ml  Output 300 ml  Net 120 ml         Physical Exam: Vital Signs Blood pressure 121/82, pulse 75, temperature 97.6 F (36.4 C), temperature source Oral, resp. rate 18, height 6\' 2"  (1.88 m), SpO2 98 %.  Gen: no distress, normal appearing HEENT: oral mucosa pink and moist, NCAT Cardio: Reg rate Chest: CTAB, normal effort, normal rate of breathing Abd: soft, non-distended, +BS Extremities: No clubbing, cyanosis, or edema Skin: No evidence of breakdown, no evidence of rash, warm and dry Neurologic: Alert and oriented x3,  follows commands, Cranial nerves  II through XII grossly intact, motor strength is 4-4+/5 in bilateral deltoid, bicep, tricep, grip, hip flexor, knee extensors, ankle dorsiflexor and plantar flexor Sensory exam reduced  sensation to light touch  in right upper and left , has intact proprioception LLE Cerebellar exam normal finger to nose to finger as well as heel to shin in right  upper and lower extremities Dysmetria LUE and LLE Musculoskeletal: right hand intrinsic atrophy    Assessment/Plan: 1. Functional deficits which require 3+ hours per day of interdisciplinary therapy in a comprehensive inpatient rehab setting. Physiatrist is providing close team supervision and 24 hour management of active medical problems listed below. Physiatrist and rehab team continue to assess barriers to discharge/monitor patient progress toward functional and medical goals  Care Tool:  Bathing    Body parts bathed by patient: Chest, Abdomen, Right upper leg, Left upper leg, Buttocks, Face, Left arm   Body parts bathed by helper: Front perineal area, Left lower leg, Right lower leg, Right arm     Bathing assist Assist Level: Moderate Assistance - Patient 50 - 74%     Upper Body Dressing/Undressing Upper body dressing   What is the patient wearing?: Pull over shirt    Upper body assist Assist Level: Moderate Assistance - Patient 50 - 74%    Lower Body Dressing/Undressing Lower body dressing  What is the patient wearing?: Underwear/pull up, Pants     Lower body assist Assist for lower body dressing: Maximal Assistance - Patient 25 - 49%     Toileting Toileting    Toileting assist Assist for toileting: Moderate Assistance - Patient 50 - 74%     Transfers Chair/bed transfer  Transfers assist     Chair/bed transfer assist level: Minimal Assistance - Patient > 75%     Locomotion Ambulation   Ambulation assist      Assist level: Minimal Assistance - Patient > 75% Assistive device: Walker-rolling (required  assitance w/ maintaing left hand on walker and RW navigation) Max distance: 36ft   Walk 10 feet activity   Assist     Assist level: Minimal Assistance - Patient > 75% (required assitance w/ maintaing left hand on walker and RW navigation) Assistive device: Walker-rolling   Walk 50 feet activity   Assist    Assist level: Minimal Assistance - Patient > 75% (required assitance w/ maintaing left hand on walker and RW navigation) Assistive device: Walker-rolling    Walk 150 feet activity   Assist Walk 150 feet activity did not occur: Safety/medical concerns         Walk 10 feet on uneven surface  activity   Assist Walk 10 feet on uneven surfaces activity did not occur: Safety/medical concerns         Wheelchair     Assist Is the patient using a wheelchair?: Yes Type of Wheelchair: Manual    Wheelchair assist level: Moderate Assistance - Patient 50 - 74% Max wheelchair distance: 30ft (Using RUE & RLE)    Wheelchair 50 feet with 2 turns activity    Assist        Assist Level: Moderate Assistance - Patient 50 - 74%   Wheelchair 150 feet activity     Assist      Assist Level: Maximal Assistance - Patient 25 - 49%   Blood pressure 121/82, pulse 75, temperature 97.6 F (36.4 C), temperature source Oral, resp. rate 18, height 6\' 2"  (1.88 m), SpO2 98 %.  Medical Problem List and Plan: 1. Functional deficits secondary to subacute right MCA ischemic infarction             -patient may  shower             -ELOS/Goals:  12/27, family hoping to d/c prior to 12/25 Continue CIR PT/OT/SLP -possible SNF discharge -Discussed safety and importance of calling nurses to get up, no focal neurological changes noted, consider head CT if any neurological changes/worsening AMS is noted, CXR-negative, u/a pending 2.  Antithrombotics: -DVT/anticoagulation:  Pharmaceutical: Eliquis             -antiplatelet therapy: Aspirin 81 mg daily 3. Pain Management:  continue Neurontin 300 mg 3 times daily, reduce nortriptyline to 50mg  mg nightly, Flexeril 10 mg nightly as needed             off gabapentin due to drowsiness no worsening of pain 12/15  -improved on low dose lyrica 25mg  BID for neuropathic pain 4. Depression: continue Lexapro 10 mg nightly, nortriptyline.  Provide emotional support             -antipsychotic agents: N/A 5. Neuropsych/cognition: This patient is capable of making decisions on his own behalf. 6. Skin/Wound Care: Routine skin checks 7. Fluids/Electrolytes/Nutrition: Routine in and outs with follow-up chemistries 8.  Atrial fibrillation/AICD.  Continue amiodarone 200 mg daily as well as Eliquis.  Cardiac rate  controlled 9.  Diabetes mellitus.  Hemoglobin A1c 9.9.  Decrease semglee to 16U.  SSI. Check blood sugars before meals and at bedtime -12/25 decrease semglee to 12u CBG (last 3)  Recent Labs    06/14/22 0628 06/14/22 0835 06/14/22 1159  GLUCAP 78 79 83     10.  Hyperlipidemia. continue Lipitor 40mg  daily 11.  Chronic systolic congestive heart failure.  Monitor for any signs of fluid overload. On Furosemide 20mg  daily.  Fluid intake poor will check BMET check daily weight   12.  BPH.  Hytrin 5 mg twice daily- reduced to 1mg  BID due to soft BP  -Stop terazosin, monitor bladder function, flomax may be better option if needed  -12/25 check PVR, consider start flomax if retaining 13.  Tobacco use.  Provide counseling 14.  Hypothyroidism.  Synthroid 15.  CKD.  Baseline creatinine 1.43-1.94.  Follow-up chemistries  -Recheck  16. HTN. Long term goal normotensive. Home medications metoprolol and cozaar currently on hold. Monitor with activity. Vitals:   06/14/22 0629 06/14/22 1441  BP: (!) 121/93 121/82  Pulse: 73 75  Resp: 18 18  Temp: 98 F (36.7 C) 97.6 F (36.4 C)  SpO2: 92% 98%  May reduce terazosin 1mg  BID  BMET today  1/26 .45 NS bolus on 12/9- ECHO showed EF 60-65%- ok BPs still soft but asymptomatic  will change amitriptyline to nortriptyline 12/25 well controlled  17. Hypothyroidism. Continue synthroid   18.  E coli UTI, completed keflex  -U/A recheck has been ordered  19. Orthostatic Hypotension  -stop terazosin due to orthostatic, monitor  LOS: 19 days A FACE TO FACE EVALUATION WAS PERFORMED  06/14/2022, 4:02 PM

## 2022-06-14 NOTE — Progress Notes (Signed)
Pt redirected to lay in bed, incontinent cares completed, pt continues to refuse to eat. Bed rails up x4 as ordered. Fall matts in place, call light in reach, bed in low position and alarm on.  Mylo Red, LPN

## 2022-06-14 NOTE — Progress Notes (Addendum)
Occupational Therapy Session Note  Patient Details  Name: Grant [name] MRN: [id number] Date of Birth: [birth year]  Today's Date: 06/15/2022 OT Individual Time: 1005-1020 OT Individual Time Calculation (min): 15 min  and Today's Date: 06/15/2022 OT Missed Time: 60 Minutes Missed Time Reason: Patient fatigue;Other (comment) (Low mood)   Short Term Goals: Week 3:  OT Short Term Goal 1 (Week 3): STGs=LTGs due to pt's length of stay.  Skilled Therapeutic Interventions/Progress Updates:  Pt received resting in bed for skilled OT session with focus on ADL retraining. Pt interacting pleasantly with OT, but respectfully declining to participate in any OOB activity this session, stating "Can we just do it in a little while?"   Pt with no reports of pain this session, agreeable to bed mobility when given a choice between two options/interventions. Pt performs boost towards [facility] with use of RUE/bed rail + Mod A.   Pt remained resting in bed with all immediate needs met and bed alarm activated at end of session. Pt continues to be appropriate for skilled OT intervention to promote further functional independence.    Therapy Documentation Precautions:  Precautions Precautions: Fall Precaution Comments: LT wrist pain with WB (fall 2 months ago on hand. No imaging) Restrictions Weight Bearing Restrictions: Yes    Therapy/Group: Individual Therapy  Lou Cal, OTR/L, MSOT   06/14/2022, 7:52 PM

## 2022-06-14 NOTE — Progress Notes (Signed)
Attempted to obtain enclosure bed. Non available at this time. However pt was redirected back to bed after incontinence care with alarm on and fall mats in place.   Marylu Lund, RN

## 2022-06-15 LAB — GLUCOSE, CAPILLARY
Glucose-Capillary: 115 mg/dL — ABNORMAL HIGH (ref 70–99)
Glucose-Capillary: 91 mg/dL (ref 70–99)
Glucose-Capillary: 113 mg/dL — ABNORMAL HIGH (ref 70–99)
Glucose-Capillary: 96 mg/dL (ref 70–99)

## 2022-06-15 MED ORDER — TAMSULOSIN HCL 0.4 MG PO CAPS
0.4000 mg | ORAL_CAPSULE | Freq: Every day | ORAL | Status: DC
  Administered 2022-06-15 – 2022-06-23 (×9): 0.4 mg via ORAL
  Filled 2022-06-15 (×9): qty 1

## 2022-06-15 NOTE — [place] FL2 (Signed)
[place] MEDICAID FL2 LEVEL OF CARE FORM     IDENTIFICATION  Patient Name: Grant [name] Birthdate: 1945-09-18 Sex: male Admission Date (Current Location): 05/26/2022  [facility] and [place] Number:  Producer, television/film/video and Address:  [facility] [place], [address] [place], [place] [address]      Provider Number:    Attending Physician Name and Address:  Erick Colace, MD  Relative Name and Phone Number:  Dhiren Azimi [phone]    Current Level of Care: Hospital Recommended Level of Care: Skilled Nursing Facility Prior Approval Number:    Date Approved/Denied:   PASRR Number: [id number]  Discharge Plan: SNF    Current Diagnoses: Patient Active Problem List   Diagnosis Date Noted   Right middle cerebral artery stroke (HCC) 05/26/2022   CVA (cerebral vascular accident) (HCC) 05/22/2022   Acute CVA (cerebrovascular accident) (HCC) 05/21/2022   PAF (paroxysmal atrial fibrillation) (HCC) 05/21/2022   Left-sided weakness 05/21/2022   Atrial fibrillation, chronic (HCC) 05/21/2022   Anticoagulated 05/21/2022   H/O medication noncompliance 05/21/2022   Acute ischemic stroke (HCC) 05/20/2022   Hyperglycemia 03/09/2017   Hyperglycemia, unspecified 03/08/2017   ICD (implantable cardioverter-defibrillator) in place 03/08/2017   Acute kidney injury (HCC) 03/08/2017   Abdominal pain    Diabetes mellitus with complication (HCC)    Viral URI with cough 09/10/2015   Upper airway cough syndrome 06/05/2015   Hx of hepatitis C 05/06/2015   Muscle cramp 05/06/2015   Diet-controlled diabetes mellitus (HCC) 06/04/2014   Essential hypertension 06/04/2014   Benign prostatic hyperplasia 06/04/2014   Tobacco abuse 06/04/2014   Osteoarthritis 06/04/2014   Hypothyroidism 06/04/2014   CAD (coronary artery disease) 06/04/2014    Orientation RESPIRATION BLADDER Height & Weight     Self, Time, Situation, Place  Normal Incontinent Weight:    Height:  6\' 2"  (188 cm)  BEHAVIORAL SYMPTOMS/MOOD NEUROLOGICAL BOWEL NUTRITION STATUS      Continent    AMBULATORY STATUS COMMUNICATION OF NEEDS Skin   Extensive Assist Verbally Normal                       Personal Care Assistance Level of Assistance  Bathing, Dressing Bathing Assistance: Maximum assistance   Dressing Assistance: Maximum assistance     Functional Limitations Info             SPECIAL CARE FACTORS FREQUENCY  PT (By licensed PT), OT (By licensed OT), Bowel and bladder program, Speech therapy     PT Frequency: 5x a week OT Frequency: 5x a week     Speech Therapy Frequency: 3x a week      Contractures      Additional Factors Info                  Current Medications (06/15/2022):  This is the current hospital active medication list Current Facility-Administered Medications  Medication Dose Route Frequency Provider Last Rate Last Admin   acetaminophen (TYLENOL) tablet 650 mg  650 mg Oral Q4H PRN 06/17/2022, PA-C   650 mg at 05/31/22 14/11/23   Or   acetaminophen (TYLENOL) 160 MG/5ML solution 650 mg  650 mg Per Tube Q4H PRN Angiulli, [id number], PA-C       Or   acetaminophen (TYLENOL) suppository 650 mg  650 mg Rectal Q4H PRN Angiulli, Mcarthur Rossetti, PA-C       alum & mag hydroxide-simeth (MAALOX/MYLANTA) 200-200-20 MG/5ML suspension 30 mL  30 mL  Oral Q4H PRN Bary Leriche, PA-C   30 mL at 06/10/22 1538   amiodarone (PACERONE) tablet 200 mg  200 mg Oral Daily Cathlyn Parsons, PA-C   200 mg at 06/15/22 [id number]   apixaban (ELIQUIS) tablet 5 mg  5 mg Oral BID Cathlyn Parsons, PA-C   5 mg at 06/15/22 [id number]   aspirin EC tablet 81 mg  81 mg Oral Daily Cathlyn Parsons, PA-C   81 mg at 06/15/22 [id number]   atorvastatin (LIPITOR) tablet 40 mg  40 mg Oral QHS Cathlyn Parsons, PA-C   40 mg at 06/14/22 2103   cyclobenzaprine (FLEXERIL) tablet 10 mg  10 mg Oral QHS PRN AngiulliLavon Paganini, PA-C       escitalopram (LEXAPRO) tablet 10 mg  10 mg Oral QHS  Cathlyn Parsons, PA-C   10 mg at 06/14/22 2103   fluticasone (FLONASE) 50 MCG/ACT nasal spray 2 spray  2 spray Each Nare Daily Cathlyn Parsons, PA-C   2 spray at 06/15/22 0854   guaiFENesin-dextromethorphan (ROBITUSSIN DM) 100-10 MG/5ML syrup 5 mL  5 mL Oral Q4H PRN Jennye Boroughs, MD       insulin aspart (novoLOG) injection 0-15 Units  0-15 Units Subcutaneous TID WC Cathlyn Parsons, PA-C   2 Units at 06/10/22 [id number]   insulin glargine-yfgn (SEMGLEE) injection 12 Units  12 Units Subcutaneous QHS Jennye Boroughs, MD   12 Units at 06/14/22 2210   levothyroxine (SYNTHROID) tablet 50 mcg  50 mcg Oral QAC breakfast Cathlyn Parsons, PA-C   50 mcg at 06/15/22 [id number]   multivitamin with minerals tablet 1 tablet  1 tablet Oral Daily Cathlyn Parsons, PA-C   1 tablet at 06/15/22 [id number]   nortriptyline (PAMELOR) capsule 50 mg  50 mg Oral QHS Charlett Blake, MD   50 mg at 06/14/22 2104   polyethylene glycol (MIRALAX / GLYCOLAX) packet 17 g  17 g Oral Daily Cathlyn Parsons, PA-C   17 g at 06/14/22 [id number]   pregabalin (LYRICA) capsule 25 mg  25 mg Oral BID Jennye Boroughs, MD   25 mg at 06/15/22 [id number]   senna-docusate (Senokot-S) tablet 1 tablet  1 tablet Oral QHS PRN Cathlyn Parsons, PA-C       tamsulosin (FLOMAX) capsule 0.4 mg  0.4 mg Oral QPC supper Kirsteins, Luanna Salk, MD         Discharge Medications: Please see discharge summary for a list of discharge medications.  Relevant Imaging Results:  Relevant Lab Results:   Additional Information SS: [id number]  Dyanne Iha

## 2022-06-15 NOTE — Progress Notes (Addendum)
Patient ID: Grant [name], male   DOB: 1946/03/18, 76 y.o.   MRN: [id number]  Sw spoke with patient spouse, [name]. Spouse is not agreeable to [facility] due [facility] disctance. Spouse requesting to try SNF placement through pt's [facility] Medicare. Sw has reached out for auth. Authorization request form faxed to SW to complete. Form to be faxed back to [phone] for SNF determination. Spouse informed.  OZD#[id number]

## 2022-06-15 NOTE — Progress Notes (Signed)
Occupational Therapy Session Note  Patient Details  Name: Grant [name] MRN: [id number] Date of Birth: July 20, 1945  Today's Date: 06/15/2022 OT Individual Time: 7829-5621 OT Individual Time Calculation (min): 70 min    Short Term Goals: Week 2:  OT Short Term Goal 1 (Week 2): Pt will be able to hold static stand balance with no UE support with CGA or less to prep for LB dressing. OT Short Term Goal 1 - Progress (Week 2): Revised due to lack of progress OT Short Term Goal 2 (Week 2): Pt will be able pull pants over hips with min A using 1 hand support on grab bar or rail. OT Short Term Goal 2 - Progress (Week 2): Revised due to lack of progress OT Short Term Goal 3 (Week 2): Pt will demostrate improved L side coordination to don a overhead shirt with supervision. OT Short Term Goal 3 - Progress (Week 2): Progressing toward goal  Skilled Therapeutic Interventions/Progress Updates:  Pt greeted asleep in supine, pt easily able to arouse and pt agreeable to OT intervention. Session focus on BADL reeducation, functional mobility, dynamic standing balance, L sided attention, visual scanning and decreasing overall caregiver burden.     Pt completed supine>sit with MIN A to L side of bed, utilized stedy for shower transfer d/t pts fluctuation with mobility. Pt able to stand to stedy with CGA, total A transport into shower. Pt completed bathing from shower with overall MINA, pt leaning laterally to R at times needing cues to correct. Utilized LH sponge for LB bathing and to wash back from shower seat. Pt did initiate using LUE to was R arm and underneath LUE. Pt exited shower in same manner as previously indicated. After the shower pt did report that he felt mild dizziness but did not report in the moment.   Pt completed dressing from EOB. With increased time and effort pt able to don OH shirt with MINA. Pt completed LB dressing with + time and effort with MODA. Pt needed increased assist to  thread LLE into pants and needed assist to  pull pants up to waist line in standing. Pt able to stand with MIN A with MAX cues for hand placement, did place colored tape on L handle of walker to work on L sided attention for hand placement.   Pt completed stand pivot to w/c to R side with Rw and MINA. Total A transport to gym for time mgmt with pt instructed to stand at hi lo table with RW with MINA. Pt instructed to retrieve letters on L side of checkered board with LUE to work on L sided attention. At first pt reaching down to pick up checker pieces only but with MAX cues able to locate letters on L side. Poor FMC noted with pt needing to look at L hand during task or pt noted to drop magnetic letters easily. Overall, Pt completed task with MIN A for balance ( L lean with fatigue) and MIN cues for sequencing and problem solving task.   Ended session with pt supine in bed with all needs within reach and bed alarm activated.                   Therapy Documentation Precautions:  Precautions Precautions: Fall Precaution Comments: LT wrist pain with WB (fall 2 months ago on hand. No imaging) Restrictions Weight Bearing Restrictions: Yes  Pain: no pain    Therapy/Group: Individual Therapy  Grant [name] 06/15/2022, 3:38 PM

## 2022-06-15 NOTE — Progress Notes (Signed)
Physical Therapy Session Note  Patient Details  Name: Grant [name] MRN: [id number] Date of Birth: September 24, 1945  Today's Date: 06/15/2022 PT Individual Time: 1540-1635 PT Individual Time Calculation (min): 55 min   Short Term Goals: Week 3:  PT Short Term Goal 1 (Week 3): Pt will ambulate 41ft with CGA and no LOB PT Short Term Goal 2 (Week 3): Pt will trasnfer to [facility] with CGA consistently PT Short Term Goal 3 (Week 3): Pt will tolerate standing >3 mintues to imrpove safety with ADLS and CGA  Skilled Therapeutic Interventions/Progress Updates:    Pt received sitting EOB watching TV (safe sitting balance) and agreeable to therapy session with min encouragement. Sit>stand EOB>RW with CGA and pt continuing to use backs of legs against bed for balance support due to posterior lean bias. R stand pivot EOB>TIS w/c using RW with CGA/light min assist for steadying/balance with cuing/manual facilitation for AD management when turning and cuing to turn fully prior to sitting. Pt continues to have difficulty stepping to turn fully and line up with the chair prior to sitting.  Transported to/from gym in w/c for time management and energy conservation.  Gait training 136ft + 31ft using RW with CGA/light min assist for steadying/balance, pt continues to have increased postural sway with L lean bias and having to take staggered steps to regain balance while using UE support on AD for stability as well.   Gait training ~154ft using +2 B HHA for a challenge including dynamic gait tasks of attempting backwards walking but pt with repeated posterior lean requiring max assist to prevent LOB therefore discontinued and then side stepping towards R with pt continuing to require heavy mod assist to maintain balance due to posterior lean tendency.   Pt's pants noticed to be falling down; therefore, provided pt with RW and educated pt on importance of him maintaining his balance as his 1st priority and that would  allow therapist to provide dependent LB clothing management while maintaining pt's safety - at first pt was trying to stand AND assist with pants, but he kept having repeated L posterior LOB requiring heavy min/light mod assist to maintain balance preventing therapist from being able to manage his clothing. Educated pt on importance of always prioritizing his focus on his balance.    Dynamic gait training with +2 B HHA of forward walking ~35ft transitioned to side stepping towards L with pt having consistent L posterior lean requiring constant heavy min/mod assits to maintain upright.   Transitioned to STATIC  standing with mirror feedback focusing on trying to obtain and maintain midline with pt having persistent posterior lean requiring constant mod/max assist to maintain upright and pt with poor utilization of hip strategy and stepping strategies to regain and maintain his balance. Pt also becoming fatigued at this time impacting his balance.   Transported back to his room.  Sit>stand w/c>RW with CGA for steadying, pt still minimally using backs of legs against seat for balance upon standing. Gait training ~75ft around bed in room using RW with CGA/light min assist for steadying/safety. Pt with good safety awareness and turning fully prior to sitting on bed.  Pt left seated on EOB with needs in reach, bed alarm on, and telesitter in place.   Therapy Documentation Precautions:  Precautions Precautions: Fall Precaution Comments: LT wrist pain with WB (fall 2 months ago on hand. No imaging) Restrictions Weight Bearing Restrictions: Yes   Pain:  Denies pain during session.   Therapy/Group: Individual Therapy  Makena Murdock  Verdell Face , PT, DPT, NCS, CSRS 06/15/2022, 3:31 PM

## 2022-06-15 NOTE — Plan of Care (Signed)
Problem: RH Floor Transfers Goal: LTG Patient will perform floor transfers w/assist (PT) Description: LTG: Patient will perform floor transfers with assistance (PT). Outcome: Not Applicable  Pt unable to coordinate balance and therefore inappropriate for floor transfers d/t lack of progress during therapy stay.   Problem: RH Balance Goal: LTG Patient will maintain dynamic sitting balance (PT) Description: LTG:  Patient will maintain dynamic sitting balance with assistance during mobility activities (PT) Flowsheets (Taken 06/13/2022 1631) LTG: Pt will maintain dynamic sitting balance during mobility activities with:: Contact Guard/Touching assist Goal: LTG Patient will maintain dynamic standing balance (PT) Description: LTG:  Patient will maintain dynamic standing balance with assistance during mobility activities (PT) Flowsheets (Taken 06/13/2022 1631) LTG: Pt will maintain dynamic standing balance during mobility activities with:: Minimal Assistance - Patient > 75%   Problem: Sit to Stand Goal: LTG:  Patient will perform sit to stand with assistance level (PT) Description: LTG:  Patient will perform sit to stand with assistance level (PT) Flowsheets (Taken 06/13/2022 1631) LTG: PT will perform sit to stand in preparation for functional mobility with assistance level:  Contact Guard/Touching assist  Minimal Assistance - Patient > 75%   Problem: RH Bed Mobility Goal: LTG Patient will perform bed mobility with assist (PT) Description: LTG: Patient will perform bed mobility with assistance, with/without cues (PT). Flowsheets (Taken 06/13/2022 1631) LTG: Pt will perform bed mobility with assistance level of: Contact Guard/Touching assist   Problem: RH Bed to Chair Transfers Goal: LTG Patient will perform bed/chair transfers w/assist (PT) Description: LTG: Patient will perform bed to chair transfers with assistance (PT). Flowsheets (Taken 06/13/2022 1631) LTG: Pt will perform Bed to  Chair Transfers with assistance level: Minimal Assistance - Patient > 75%   Problem: RH Ambulation Goal: LTG Patient will ambulate in controlled environment (PT) Description: LTG: Patient will ambulate in a controlled environment, # of feet with assistance (PT). Flowsheets (Taken 06/13/2022 1631) LTG: Pt will ambulate in controlled environ  assist needed:: Minimal Assistance - Patient > 75% Goal: LTG Patient will ambulate in home environment (PT) Description: LTG: Patient will ambulate in home environment, # of feet with assistance (PT). Flowsheets (Taken 06/13/2022 1631) LTG: Pt will ambulate in home environ  assist needed:: Minimal Assistance - Patient > 75% Goal: LTG Patient will ambulate in community environment (PT) Description: LTG: Patient will ambulate in community environment, # of feet with assistance (PT). Flowsheets (Taken 06/13/2022 1631) LTG: Pt will ambulate in community environ  assist needed:: Moderate Assistance - Patient 50 - 74%   Problem: RH Wheelchair Mobility Goal: LTG Patient will propel w/c in home environment (PT) Description: LTG: Patient will propel wheelchair in home environment, # of feet with assistance (PT). Flowsheets (Taken 06/13/2022 1631) LTG: Pt will propel w/c in home environ  assist needed:: Contact Guard/Touching assist Goal: LTG Patient will propel w/c in community environment (PT) Description: LTG: Patient will propel wheelchair in community environment, # of feet with assist (PT) Flowsheets (Taken 06/13/2022 1631) LTG: Pt will propel w/c in community environ  assist needed:: Minimal Assistance - Patient > 75%   Problem: RH Stairs Goal: LTG Patient will ambulate up and down stairs w/assist (PT) Description: LTG: Patient will ambulate up and down # of stairs with assistance (PT) Flowsheets (Taken 06/13/2022 1631) LTG: Pt will ambulate up/down stairs assist needed:: Moderate Assistance - Patient 50 - 74%   Problem: RH Attention Goal: LTG  Patient will demonstrate this level of attention during functional activites (PT) Description: LTG:  Patient will demonstrate  this level of attention during functional activites (PT) Flowsheets (Taken 06/13/2022 1631) LTG: Patient will demonstrate attention during functional mobility with assistance of: Supervision   Problem: RH Awareness Goal: LTG: Patient will demonstrate awareness during functional activites type of (PT) Description: LTG: Patient will demonstrate awareness during functional activites type of (PT) Flowsheets (Taken 06/13/2022 1631) LTG: Patient will demonstrate awareness during functional activites type of (PT): Minimal Assistance - Patient > 75%

## 2022-06-15 NOTE — Progress Notes (Signed)
PROGRESS NOTE   Subjective/Complaints: Awakens to voice, denies pain issues, no breathing problems, follows simple commands, uses LUE to adjust blanket  ROS- denies CP, SOB, N/V/D, abdominal pain, HA, cough, dizziness, dysuria   Objective:   DG Chest 1 View  Result Date: 06/14/2022 CLINICAL DATA:  Altered mental status EXAM: CHEST  1 VIEW COMPARISON:  05/29/2022 FINDINGS: Cardiac size is within normal limits. Pacemaker/defibrillator battery is seen in left infraclavicular region. Lung fields are clear of any infiltrates or pulmonary edema. There is no pleural effusion or pneumothorax. There are surgical clips at the gastroesophageal junction. Metallic sutures are noted overlying the cardiac shadow, possibly in the lower sternum. IMPRESSION: There are no signs of pulmonary edema or focal pulmonary consolidation. Electronically Signed   By: Ernie Avena M.D.   On: 06/14/2022 10:54   Recent Labs    06/14/22 1658  WBC 6.0  HGB 13.0  HCT 40.2  PLT 234    Recent Labs    06/14/22 1658  NA 136  K 4.6  CL 103  CO2 25  GLUCOSE 114*  BUN 17  CREATININE 1.46*  CALCIUM 9.0     Intake/Output Summary (Last 24 hours) at 06/15/2022 0658 Last data filed at 06/14/2022 1345 Gross per 24 hour  Intake 180 ml  Output --  Net 180 ml         Physical Exam: Vital Signs Blood pressure 106/75, pulse 72, temperature 97.8 F (36.6 C), temperature source Oral, resp. rate 17, height 6\' 2"  (1.88 m), SpO2 97 %.   General: No acute distress Mood and affect are appropriate Heart: Regular rate and rhythm no rubs murmurs or extra sounds Lungs: Clear to auscultation, breathing unlabored, no rales or wheezes Abdomen: Positive bowel sounds, soft nontender to palpation, nondistended Extremities: No clubbing, cyanosis, or edema   Neurologic: Alert and oriented x3,  follows commands, Cranial nerves II through XII grossly intact, motor  strength is 4-4+/5 in bilateral deltoid, bicep, tricep, grip, hip flexor, knee extensors, ankle dorsiflexor and plantar flexor Sensory exam reduced  sensation to light touch  in right upper and left , has intact proprioception LLE Cerebellar exam normal finger to nose to finger as well as heel to shin in right  upper and lower extremities Dysmetria LUE and LLE Musculoskeletal: right hand intrinsic atrophy    Assessment/Plan: 1. Functional deficits which require 3+ hours per day of interdisciplinary therapy in a comprehensive inpatient rehab setting. Physiatrist is providing close team supervision and 24 hour management of active medical problems listed below. Physiatrist and rehab team continue to assess barriers to discharge/monitor patient progress toward functional and medical goals  Care Tool:  Bathing    Body parts bathed by patient: Chest, Abdomen, Right upper leg, Left upper leg, Buttocks, Face, Left arm   Body parts bathed by helper: Front perineal area, Left lower leg, Right lower leg, Right arm     Bathing assist Assist Level: Moderate Assistance - Patient 50 - 74%     Upper Body Dressing/Undressing Upper body dressing   What is the patient wearing?: Pull over shirt    Upper body assist Assist Level: Moderate Assistance - Patient 50 - 74%  Lower Body Dressing/Undressing Lower body dressing      What is the patient wearing?: Underwear/pull up, Pants     Lower body assist Assist for lower body dressing: Maximal Assistance - Patient 25 - 49%     Toileting Toileting    Toileting assist Assist for toileting: Moderate Assistance - Patient 50 - 74%     Transfers Chair/bed transfer  Transfers assist     Chair/bed transfer assist level: Minimal Assistance - Patient > 75%     Locomotion Ambulation   Ambulation assist      Assist level: Minimal Assistance - Patient > 75% Assistive device: Walker-rolling (required assitance w/ maintaing left hand on  walker and RW navigation) Max distance: 69ft   Walk 10 feet activity   Assist     Assist level: Minimal Assistance - Patient > 75% (required assitance w/ maintaing left hand on walker and RW navigation) Assistive device: Walker-rolling   Walk 50 feet activity   Assist    Assist level: Minimal Assistance - Patient > 75% (required assitance w/ maintaing left hand on walker and RW navigation) Assistive device: Walker-rolling    Walk 150 feet activity   Assist Walk 150 feet activity did not occur: Safety/medical concerns         Walk 10 feet on uneven surface  activity   Assist Walk 10 feet on uneven surfaces activity did not occur: Safety/medical concerns         Wheelchair     Assist Is the patient using a wheelchair?: Yes Type of Wheelchair: Manual    Wheelchair assist level: Moderate Assistance - Patient 50 - 74% Max wheelchair distance: 14ft (Using RUE & RLE)    Wheelchair 50 feet with 2 turns activity    Assist        Assist Level: Moderate Assistance - Patient 50 - 74%   Wheelchair 150 feet activity     Assist      Assist Level: Maximal Assistance - Patient 25 - 49%   Blood pressure 106/75, pulse 72, temperature 97.8 F (36.6 C), temperature source Oral, resp. rate 17, height 6\' 2"  (1.88 m), SpO2 97 %.  Medical Problem List and Plan: 1. Functional deficits secondary to subacute right MCA ischemic infarction             -patient may  shower             -ELOS/Goals:  12/27, family hoping to d/c prior to 12/25 Continue CIR PT/OT/SLP -possible SNF discharge -Discussed safety and importance of calling nurses to get up, no focal neurological changes noted, consider head CT if any neurological changes/worsening AMS is noted, CXR-negative, u/a pending 2.  Antithrombotics: -DVT/anticoagulation:  Pharmaceutical: Eliquis             -antiplatelet therapy: Aspirin 81 mg daily 3. Pain Management: continue Neurontin 300 mg 3 times daily,  reduce nortriptyline to 50mg  mg nightly, Flexeril 10 mg nightly as needed             off gabapentin due to drowsiness no worsening of pain 12/15  -improved on low dose lyrica 25mg  BID for neuropathic pain 4. Depression: continue Lexapro 10 mg nightly, nortriptyline.  Provide emotional support             -antipsychotic agents: N/A 5. Neuropsych/cognition: This patient is capable of making decisions on his own behalf. 6. Skin/Wound Care: Routine skin checks 7. Fluids/Electrolytes/Nutrition: Routine in and outs with follow-up chemistries 8.  Atrial fibrillation/AICD.  Continue  amiodarone 200 mg daily as well as Eliquis.  Cardiac rate controlled 9.  Diabetes mellitus.  Hemoglobin A1c 9.9.  Decrease semglee to 16U.  SSI. Check blood sugars before meals and at bedtime -12/25 decrease semglee to 12u CBG (last 3)  Recent Labs    06/14/22 1647 06/14/22 2104 06/15/22 0538  GLUCAP 114* 96 115*     10.  Hyperlipidemia. continue Lipitor 40mg  daily 11.  Chronic systolic congestive heart failure.  Monitor for any signs of fluid overload. On Furosemide 20mg  daily.  Fluid intake poor will check BMET check daily weight   12.  BPH.  Hytrin 5 mg twice daily- reduced to 1mg  BID due to soft BP  -Stop terazosin, monitor bladder function, flomax may be better option if needed  -12/25 check PVR, consider start flomax if retaining- pt refused Will start flomax  13.  Tobacco use.  Provide counseling 14.  Hypothyroidism.  Synthroid 15.  CKD.  Baseline creatinine 1.43-1.94.  Follow-up chemistries  -Recheck is stable 16. HTN. Long term goal normotensive. Home medications metoprolol and cozaar currently on hold. Monitor with activity. Vitals:   06/14/22 1935 06/15/22 0536  BP: 116/72 106/75  Pulse: 79 72  Resp: 18 17  Temp: 98.9 F (37.2 C) 97.8 F (36.6 C)  SpO2: 97% 97%  May reduce terazosin 1mg  BID  BMET today  259ml .45 NS bolus on 12/9- ECHO showed EF 60-65%- ok BPs still soft but asymptomatic  will change amitriptyline to nortriptyline 12/25 well controlled  17. Hypothyroidism. Continue synthroid   18.  E coli UTI, completed keflex  -U/A recheck has been ordered  19. Orthostatic Hypotension  -stop terazosin due to orthostatic, monitor- may need to stop or reduce nortriptyline  LOS: 20 days A FACE TO FACE EVALUATION WAS PERFORMED  Charlett Blake 06/15/2022, 6:58 AM

## 2022-06-16 LAB — GLUCOSE, CAPILLARY
Glucose-Capillary: 104 mg/dL — ABNORMAL HIGH (ref 70–99)
Glucose-Capillary: 149 mg/dL — ABNORMAL HIGH (ref 70–99)
Glucose-Capillary: 120 mg/dL — ABNORMAL HIGH (ref 70–99)
Glucose-Capillary: 122 mg/dL — ABNORMAL HIGH (ref 70–99)

## 2022-06-16 LAB — URINE CULTURE: Culture: >=100000 — AB

## 2022-06-16 MED ORDER — CEPHALEXIN 250 MG PO CAPS
250.0000 mg | ORAL_CAPSULE | Freq: Three times a day (TID) | ORAL | Status: DC
  Administered 2022-06-16: 250 mg via ORAL
  Filled 2022-06-16: qty 1

## 2022-06-16 MED ORDER — NORTRIPTYLINE HCL 25 MG PO CAPS
25.0000 mg | ORAL_CAPSULE | Freq: Every day | ORAL | Status: DC
  Administered 2022-06-16: 25 mg via ORAL
  Filled 2022-06-16: qty 1

## 2022-06-16 MED ORDER — AMOXICILLIN 250 MG PO CAPS
250.0000 mg | ORAL_CAPSULE | Freq: Three times a day (TID) | ORAL | Status: DC
  Administered 2022-06-16 – 2022-06-24 (×25): 250 mg via ORAL
  Filled 2022-06-16 (×26): qty 1

## 2022-06-16 MED ORDER — AMOXICILLIN 250 MG PO CHEW
250.0000 mg | CHEWABLE_TABLET | Freq: Three times a day (TID) | ORAL | Status: DC
  Filled 2022-06-16: qty 1

## 2022-06-16 NOTE — Progress Notes (Signed)
Occupational Therapy Session Note  Patient Details  Name: Grant [name] MRN: [id number] Date of Birth: 1946/04/30  Today's Date: 06/16/2022 OT Individual Time: 0915-1000 OT Individual Time Calculation (min): 45 min    Short Term Goals: Week 3:  OT Short Term Goal 1 (Week 3): STGs=LTGs due to pt's length of stay.  Skilled Therapeutic Interventions/Progress Updates:    Pt received in bed with clothing on but no TED hose, so donned knee high TEDs for pt and socks.  Pt then sat to EOB following cues to roll fully onto R side and push up with close S.  Pt sat to EOB and did warm up exercises of overhead arm reaches (L arm only lifts to 90 degrees), shoulder rolls and punches.   Pt then cued to place feet hip width, pt then stood up from EOB using B hands to push up and then reach to RW. Pt did extremely well today and moved with Supervision.  Pt stood for 2 min to ensure he felt stable and then stepped to wc with RW with S.  Pt sat in w/c and worked on L hand coordination with cutting his french toast into even squares.  Pt did well holding fork in L hand and knife in R hand.  Pt worked on self feeding with no spilling of food.  Cued pt to use R hand not L hand to hold coffee cup with hot liquid.   Pt resting in wc with belt alarm on and all needs met.   Therapy Documentation Precautions:  Precautions Precautions: Fall Precaution Comments: LT wrist pain with WB (fall 2 months ago on hand. No imaging) Restrictions Weight Bearing Restrictions: Yes    Vital Signs: Therapy Vitals Temp: 97.6 F (36.4 C) Temp Source: Oral Pulse Rate: 74 Resp: 18 BP: 118/79 Patient Position (if appropriate): Lying Oxygen Therapy SpO2: 99 % O2 Device: Room Air Pain:  No c/o pain  ADL: ADL Eating: Set up Grooming: Setup Upper Body Bathing: Supervision/safety, Minimal cueing (using wash mit on L hand) Where Assessed-Upper Body Bathing: Shower Lower Body Bathing: Minimal assistance (using long  handled sponge) Where Assessed-Lower Body Bathing: Shower Upper Body Dressing: Minimal assistance, Moderate cueing Where Assessed-Upper Body Dressing: Wheelchair Lower Body Dressing: Moderate assistance, Moderate cueing Where Assessed-Lower Body Dressing: Wheelchair Toileting: Maximal assistance Where Assessed-Toileting: Glass blower/designer: Moderate assistance Toilet Transfer Method: Stand pivot  Therapy/Group: Individual Therapy  Mellisa Arshad 06/16/2022, 8:21 AM

## 2022-06-16 NOTE — Progress Notes (Signed)
Speech Language Pathology Weekly Progress and Session Note  Patient Details  Name: Grant [name] MRN: [id number] Date of Birth: Dec 19, 1945  Beginning of progress report period: June 09, 2022 End of progress report period: June 16, 2022  Today's Date: 06/16/2022 SLP Individual Time: 1100-1145 SLP Individual Time Calculation (min): 45 min  Short Term Goals: Week 3: SLP Short Term Goal 1 (Week 3): STG=LTG due to ELOS SLP Short Term Goal 1 - Progress (Week 3): Progressing toward goal    New Short Term Goals: Week 4: SLP Short Term Goal 1 (Week 4): STG=LTG due to awaiting SNF placement  Weekly Progress Updates:Pt made good progress meeting and progressing towards cognitive goals. At the beginning of the reporting period pt demonstrated a cognitive decline, which now appeared related to a UTI (currently undergoing treatment) and now noted improvement in cognition. SLP has facilitated basic-mildly complex problem solving, emergent awareness, recall and selective attention. Education is on going and planned for next reporting period. Pt continues to benefit from skilled ST services in order to maximize functional independence and reduce burden of care requiring 24 hour supervision and continued ST services.     Intensity: Minumum of 1-2 x/day, 30 to 90 minutes Frequency: 3 to 5 out of 7 days Duration/Length of Stay: awaiting SNF Treatment/Interventions: Cognitive remediation/compensation;Functional tasks;Internal/external aids;Patient/family education;Therapeutic Activities   Daily Session  Skilled Therapeutic Interventions: Skilled ST services focused on cognitive skills. SLP facilitated basic to mildly complex problem solving, error awareness, recall and selective attention in ADL picture card sequencing tasks. Pt was able to sequence 4 step cards with mod A fade to supervision A verbal cues and 6 step cards with min A verbal cues. Pt demonstrated no confusion during the  session and was motivated to participate. Pt was left with call bell within reach and bed alarm set. Recommend to continue ST services.     General    Pain    Therapy/Group: Individual Therapy  Grant [name]  [facility] 06/16/2022, 2:02 PM

## 2022-06-16 NOTE — Progress Notes (Signed)
PROGRESS NOTE   Subjective/Complaints:  Oriented to hospital and CVA, no c/o this am , discussed urine cx result  ROS- denies CP, SOB, N/V/D, abdominal pain, HA, cough, dizziness, dysuria   Objective:   DG Chest 1 View  Result Date: 06/14/2022 CLINICAL DATA:  Altered mental status EXAM: CHEST  1 VIEW COMPARISON:  05/29/2022 FINDINGS: Cardiac size is within normal limits. Pacemaker/defibrillator battery is seen in left infraclavicular region. Lung fields are clear of any infiltrates or pulmonary edema. There is no pleural effusion or pneumothorax. There are surgical clips at the gastroesophageal junction. Metallic sutures are noted overlying the cardiac shadow, possibly in the lower sternum. IMPRESSION: There are no signs of pulmonary edema or focal pulmonary consolidation. Electronically Signed   By: Elmer Picker M.D.   On: 06/14/2022 10:54   Recent Labs    06/14/22 1658  WBC 6.0  HGB 13.0  HCT 40.2  PLT 234     Recent Labs    06/14/22 1658  NA 136  K 4.6  CL 103  CO2 25  GLUCOSE 114*  BUN 17  CREATININE 1.46*  CALCIUM 9.0      Intake/Output Summary (Last 24 hours) at 06/16/2022 0739 Last data filed at 06/15/2022 1540 Gross per 24 hour  Intake 0 ml  Output --  Net 0 ml         Physical Exam: Vital Signs Blood pressure 118/79, pulse 74, temperature 97.6 F (36.4 C), temperature source Oral, resp. rate 18, height _0  (1.88 m), SpO2 99 %.   General: No acute distress Mood and affect are appropriate Heart: Regular rate and rhythm no rubs murmurs or extra sounds Lungs: Clear to auscultation, breathing unlabored, no rales or wheezes Abdomen: Positive bowel sounds, soft nontender to palpation, nondistended Extremities: No clubbing, cyanosis, or edema   Neurologic: Alert and oriented x3,  follows commands, Cranial nerves II through XII grossly intact, motor strength is 4-4+/5 in bilateral  deltoid, bicep, tricep, grip, hip flexor, knee extensors, ankle dorsiflexor and plantar flexor Sensory exam reduced  sensation to light touch  in right upper and left , has intact proprioception LLE Cerebellar exam normal finger to nose to finger as well as heel to shin in right  upper and lower extremities Dysmetria LUE and LLE Musculoskeletal: right hand intrinsic atrophy    Assessment/Plan: 1. Functional deficits which require 3+ hours per day of interdisciplinary therapy in a comprehensive inpatient rehab setting. Physiatrist is providing close team supervision and 24 hour management of active medical problems listed below. Physiatrist and rehab team continue to assess barriers to discharge/monitor patient progress toward functional and medical goals  Care Tool:  Bathing    Body parts bathed by patient: Chest, Abdomen, Right upper leg, Left upper leg, Buttocks, Face, Left arm   Body parts bathed by helper: Front perineal area, Left lower leg, Right lower leg, Right arm     Bathing assist Assist Level: Moderate Assistance - Patient 50 - 74%     Upper Body Dressing/Undressing Upper body dressing   What is the patient wearing?: Pull over shirt    Upper body assist Assist Level: Moderate Assistance - Patient 50 - 74%  Lower Body Dressing/Undressing Lower body dressing      What is the patient wearing?: Underwear/pull up, Pants     Lower body assist Assist for lower body dressing: Maximal Assistance - Patient 25 - 49%     Toileting Toileting    Toileting assist Assist for toileting: Moderate Assistance - Patient 50 - 74%     Transfers Chair/bed transfer  Transfers assist     Chair/bed transfer assist level: Minimal Assistance - Patient > 75%     Locomotion Ambulation   Ambulation assist      Assist level: Minimal Assistance - Patient > 75% Assistive device: Walker-rolling (required assitance w/ maintaing left hand on walker and RW navigation) Max  distance: 3f   Walk 10 feet activity   Assist     Assist level: Minimal Assistance - Patient > 75% (required assitance w/ maintaing left hand on walker and RW navigation) Assistive device: Walker-rolling   Walk 50 feet activity   Assist    Assist level: Minimal Assistance - Patient > 75% (required assitance w/ maintaing left hand on walker and RW navigation) Assistive device: Walker-rolling    Walk 150 feet activity   Assist Walk 150 feet activity did not occur: Safety/medical concerns         Walk 10 feet on uneven surface  activity   Assist Walk 10 feet on uneven surfaces activity did not occur: Safety/medical concerns         Wheelchair     Assist Is the patient using a wheelchair?: Yes Type of Wheelchair: Manual    Wheelchair assist level: Moderate Assistance - Patient 50 - 74% Max wheelchair distance: 545f(Using RUE & RLE)    Wheelchair 50 feet with 2 turns activity    Assist        Assist Level: Moderate Assistance - Patient 50 - 74%   Wheelchair 150 feet activity     Assist      Assist Level: Maximal Assistance - Patient 25 - 49%   Blood pressure 118/79, pulse 74, temperature 97.6 F (36.4 C), temperature source Oral, resp. rate 18, height _0  (1.88 m), SpO2 99 %.  Medical Problem List and Plan: 1. Functional deficits secondary to subacute right MCA ischemic infarction             -patient may  shower             -ELOS/Goals:  12/27, family hoping to d/c prior to 12/25 Continue CIR PT/OT/SLP -possible SNF discharge Team conference today please see physician documentation under team conference tab, met with team  to discuss problems,progress, and goals. Formulized individual treatment plan based on medical history, underlying problem and comorbidities.  2.  Antithrombotics: -DVT/anticoagulation:  Pharmaceutical: Eliquis             -antiplatelet therapy: Aspirin 81 mg daily 3. Pain Management: continue Neurontin 300 mg  3 times daily, reduce nortriptyline to 5068mg nightly, Flexeril 10 mg nightly as needed             off gabapentin due to drowsiness no worsening of pain 12/15  -improved on low dose lyrica 41m58mD for neuropathic pain 4. Depression: continue Lexapro 10 mg nightly, nortriptyline.  Provide emotional support             -antipsychotic agents: N/A 5. Neuropsych/cognition: This patient is capable of making decisions on his own behalf. 6. Skin/Wound Care: Routine skin checks 7. Fluids/Electrolytes/Nutrition: Routine in and outs with follow-up chemistries 8.  Atrial fibrillation/AICD.  Continue amiodarone 200 mg daily as well as Eliquis.  Cardiac rate controlled 9.  Diabetes mellitus.  Hemoglobin A1c 9.9.  Decrease semglee to 16U.  SSI. Check blood sugars before meals and at bedtime -12/25 decrease semglee to 12u CBG (last 3)  Recent Labs    06/15/22 1703 06/15/22 2115 06/16/22 0521  GLUCAP 113* 96 104*   Controlled 12/27  10.  Hyperlipidemia. continue Lipitor 46m daily 11.  Chronic systolic congestive heart failure.  Monitor for any signs of fluid overload. On Furosemide 266mdaily.  Fluid intake poor will check BMET check daily weight   12.  BPH.  Hytrin 5 mg twice daily- reduced to 83m45mID due to soft BP  -Stop terazosin, monitor bladder function, flomax may be better option if needed   Will start flomax - reduce nortriptyline since this may contribute to retention  13.  Tobacco use.  Provide counseling 14.  Hypothyroidism.  Synthroid 15.  CKD.  Baseline creatinine 1.43-1.94.  Follow-up chemistries  -Recheck is stable 16. HTN. Long term goal normotensive. Home medications metoprolol and cozaar currently on hold. Monitor with activity. Vitals:   06/15/22 2030 06/16/22 0518  BP: 130/73 118/79  Pulse: 79 74  Resp: 18 18  Temp: 98.1 F (36.7 C) 97.6 F (36.4 C)  SpO2: 96% 99%  May reduce terazosin 83mg3mD  BMET today  250ml68m NS bolus on 12/9- ECHO showed EF 60-65%- ok BP  well controlled 12/27  17. Hypothyroidism. Continue synthroid   18.  E coli UTI, completed keflex  -recurrent , >100KGNR  start oral cephalexin pending cx result  19. Orthostatic Hypotension  -stop terazosin due to orthostatic, monitor- may need to stop or reduce nortriptyline 20.  Urinary retention started on flomax 12/26 LOS: 21 days A FACE TO FACE [facility]steins 06/16/2022, 7:39 AM

## 2022-06-16 NOTE — Progress Notes (Signed)
Occupational Therapy Session Note  Patient Details  Name: Grant [name] MRN: [id number] Date of Birth: 05/21/1946  Today's Date: 06/16/2022 OT Individual Time: 1315-1401 OT Individual Time Calculation (min): 46 min    Short Term Goals: Week 3:  OT Short Term Goal 1 (Week 3): STGs=LTGs due to pt's length of stay.  Skilled Therapeutic Interventions/Progress Updates:  Pt received seated in WC for skilled OT session with focus on standing tolerance/balance, functional transfers, and bilateral UE integration. Pt agreeable to interventions, demonstrating overall pleasant mood. Pt with no reports of pain, stating "My foot (L) is just cold." OT offering intermediate rest breaks and positioning suggestions throughout session to address pain/fatigue and maximize participation/safety in session.   Pt dependent for WC transport from room<>simulated kitchen environment. In kitchen environment, pt participates in counter-top activity, targeting standing tolerance/balance and bilat UE integration. Pt completes three trials of activity, each lasting ~2 mins before increased L-knee buckling/unsteadiness noted. Pt completes trials with CGA-Min A for standing balance, requiring tactile cuing for activation of L-quad towards later-ends of trials.  Throughout same activity, pt practices STS transfers from arm-less chair with overall Min A + RW. Pt and OT place focus on walking backwards to sit on chair to simulate need for this movement when completing toileting. Pt with increased difficulty to motor plan walking backwards, requiring heavy multimodal cuing, Min A + RW to safely complete.   Pt remained seated in WC with all immediate needs met and lap belt alarmed at end of session. Pt continues to be appropriate for skilled OT intervention to promote further functional independence.    Therapy Documentation Precautions:  Precautions Precautions: Fall Precaution Comments: LT wrist pain with WB (fall 2  months ago on hand. No imaging) Restrictions Weight Bearing Restrictions: Yes    Therapy/Group: Individual Therapy  Francy Pachon-Marin, OTR/L, MSOT  06/16/2022, 6:25 AM 

## 2022-06-16 NOTE — Progress Notes (Signed)
Physical Therapy Session Note  Patient Details  Name: Grant [name] MRN: [id number] Date of Birth: [birth year]  Today's Date: 06/16/2022 PT Individual Time: 1856-3149 PT Individual Time Calculation (min): 62 min   Short Term Goals: Week 3:  PT Short Term Goal 1 (Week 3): Pt will ambulate 50ft with CGA and no LOB PT Short Term Goal 2 (Week 3): Pt will trasnfer to [facility] with CGA consistently PT Short Term Goal 3 (Week 3): Pt will tolerate standing >3 mintues to imrpove safety with ADLS and CGA  Skilled Therapeutic Interventions/Progress Updates:    Pt received sitting in TIS w/c with his wife, [name], and son present. Pt agreeable to therapy and pt's family present throughout session to observe his CLOF. Pt's wife has pt a new pair of tennis shoes to try - max assist to don with pt reporting good fit and appears to be correct size. Therapist did educate pt's wife on possibly purchasing him tie-up tennis shoes if he is going to need continued assistance to don his shoes, to allow them to open more and make it easier on her, rather than slide on shoes.    Transported to/from gym in w/c for time management and energy conservation.  Sit>stands with light CGA progressed to light min assist towards end of session with fatigue both using and not using RW - pt with significant improvement in standing balance upon rising with no noticeable posterior lean as was present yesterday. Pt continues to benefit from cuing to fold trunk forward and down when returning to sit to improve eccentric control.  Gait training 180ft using RW with CGA for steadying/safety - pt demos min balance instability with increased postural sway and pt adjusting foot steps and using B UE support on AD to maintain balance. Despite pt being successful with CGA, if pt were to have a LOB it would require up to mod assist to maintain upright due to his impaired balance recovery strategies; although improving.  Gait training ~82ft,  using RW including turns through doorways, - continued CGA with occasional light min assist for balance due to increased postural sway.  Stair navigation training ascending/descending 4 steps x2 seated break (6" height) using B HRs with min assist of 1 and +2 providing close guarding for safety but no hands-on assistance - pt self selecting reciprocal pattern on ascent but cued for step-to pattern leading with L LE on descent with therapist also cuing to bring his trunk and hands forward on HRs during descent to prevent posterior lean. Pt maintaining upright posture during descent this day.  Gait training additional 175ft + 143ft without UE support, min assist, but +2 assist closely guarding pt for safety - requires min assist from therapist for balance due to anterior vs R postero-lateral LOB - pt continues to maintain reciprocal stepping pattern and has mild postural instability with inconsistent step widths in order to maintain balance. With fatigue, pt will take short steps landing on his toes causing the anterior lean.   Dynamic gait training additional ~44ft, no UE support, including sudden start/stops - therapist providing min progressed to mod assist towards end due to worsening R/L and anterior posture sway with +2 providing close guarding for safety - goal of challenging pt's ability to maintain static balance due to this being so difficult for pt yesterday - significant improvement noted with pt able to maintain static balance with CGA for safety for at least 5 seconds.   Transported back to room and pt requesting to  return to bed. L stand pivot TIS w/c>EOB with light min assist for steadying and facilitating turning with pt using LEs against bed for balance support while turning. Pt left seated EOB with needs in reach, meal tray set-up, bed alarm on, family present, and NT aware of pt's position.   Therapy Documentation Precautions:  Precautions Precautions: Fall Precaution Comments: LT  wrist pain with WB (fall 2 months ago on hand. No imaging) Restrictions Weight Bearing Restrictions: Yes   Pain:  No reports of pain throughout session.    Therapy/Group: Individual Therapy  Ginny Forth , PT, DPT, NCS, CSRS 06/16/2022, 3:36 PM

## 2022-06-16 NOTE — Progress Notes (Signed)
Patient ID: Grant [name], male   DOB: [birth year], 76 y.o.   MRN: [id number]  Team Conference Report to Patient/Family  Team Conference discussion was reviewed with the patient and caregiver, including goals, any changes in plan of care and target discharge date.  Patient and caregiver express understanding and are in agreement.  The patient has a target discharge date of  (SNF pending).  Patient and spouse informed of waiting on insurance determination for SNF decision. Spouse informed of current bed offers. Once insurance determination is made spouse will decide on placement. Currently spouse prefers [facility]. No additional questions or concerns.   Andria Rhein 06/16/2022, 1:30 PM

## 2022-06-17 LAB — GLUCOSE, CAPILLARY
Glucose-Capillary: 90 mg/dL (ref 70–99)
Glucose-Capillary: 122 mg/dL — ABNORMAL HIGH (ref 70–99)
Glucose-Capillary: 86 mg/dL (ref 70–99)
Glucose-Capillary: 133 mg/dL — ABNORMAL HIGH (ref 70–99)

## 2022-06-17 MED ORDER — NORTRIPTYLINE HCL 10 MG PO CAPS
10.0000 mg | ORAL_CAPSULE | Freq: Every day | ORAL | Status: DC
  Administered 2022-06-17 – 2022-06-22 (×6): 10 mg via ORAL
  Filled 2022-06-17 (×6): qty 1

## 2022-06-17 MED ORDER — INSULIN GLARGINE-YFGN 100 UNIT/ML ~~LOC~~ SOLN
10.0000 [IU] | Freq: Every day | SUBCUTANEOUS | Status: DC
  Administered 2022-06-17 – 2022-06-23 (×7): 10 [IU] via SUBCUTANEOUS
  Filled 2022-06-17 (×8): qty 0.1

## 2022-06-17 NOTE — Progress Notes (Signed)
PROGRESS NOTE   Subjective/Complaints:  No pains overnite , slept well   ROS- denies CP, SOB, N/V/D, abdominal pain, HA, cough, dizziness, dysuria   Objective:   No results found. Recent Labs    06/14/22 1658  WBC 6.0  HGB 13.0  HCT 40.2  PLT 234     Recent Labs    06/14/22 1658  NA 136  K 4.6  CL 103  CO2 25  GLUCOSE 114*  BUN 17  CREATININE 1.46*  CALCIUM 9.0     No intake or output data in the 24 hours ending 06/17/22 0747       Physical Exam: Vital Signs Blood pressure 123/69, pulse 73, temperature 98 F (36.7 C), resp. rate 16, height 6\' 2"  (1.88 m), SpO2 96 %.   General: No acute distress Mood and affect are appropriate Heart: Regular rate and rhythm no rubs murmurs or extra sounds Lungs: Clear to auscultation, breathing unlabored, no rales or wheezes Abdomen: Positive bowel sounds, soft nontender to palpation, nondistended Extremities: No clubbing, cyanosis, or edema   Neurologic: Alert and oriented x3,  follows commands, Cranial nerves II through XII grossly intact, motor strength is 4-4+/5 in bilateral deltoid, bicep, tricep, grip, hip flexor, knee extensors, ankle dorsiflexor and plantar flexor Sensory exam reduced  sensation to light touch  in right upper and left , has intact proprioception LLE Cerebellar exam normal finger to nose to finger as well as heel to shin in right  upper and lower extremities Dysmetria LUE and LLE Musculoskeletal: right hand intrinsic atrophy    Assessment/Plan: 1. Functional deficits which require 3+ hours per day of interdisciplinary therapy in a comprehensive inpatient rehab setting. Physiatrist is providing close team supervision and 24 hour management of active medical problems listed below. Physiatrist and rehab team continue to assess barriers to discharge/monitor patient progress toward functional and medical goals  Care Tool:  Bathing    Body  parts bathed by patient: Chest, Abdomen, Right upper leg, Left upper leg, Buttocks, Face, Left arm   Body parts bathed by helper: Front perineal area, Left lower leg, Right lower leg, Right arm     Bathing assist Assist Level: Moderate Assistance - Patient 50 - 74%     Upper Body Dressing/Undressing Upper body dressing   What is the patient wearing?: Pull over shirt    Upper body assist Assist Level: Moderate Assistance - Patient 50 - 74%    Lower Body Dressing/Undressing Lower body dressing      What is the patient wearing?: Underwear/pull up, Pants     Lower body assist Assist for lower body dressing: Maximal Assistance - Patient 25 - 49%     Toileting Toileting    Toileting assist Assist for toileting: Moderate Assistance - Patient 50 - 74%     Transfers Chair/bed transfer  Transfers assist     Chair/bed transfer assist level: Minimal Assistance - Patient > 75%     Locomotion Ambulation   Ambulation assist      Assist level: Minimal Assistance - Patient > 75% Assistive device: Walker-rolling (required assitance w/ maintaing left hand on walker and RW navigation) Max distance: 59ft   Walk 10  feet activity   Assist     Assist level: Minimal Assistance - Patient > 75% (required assitance w/ maintaing left hand on walker and RW navigation) Assistive device: Walker-rolling   Walk 50 feet activity   Assist    Assist level: Minimal Assistance - Patient > 75% (required assitance w/ maintaing left hand on walker and RW navigation) Assistive device: Walker-rolling    Walk 150 feet activity   Assist Walk 150 feet activity did not occur: Safety/medical concerns         Walk 10 feet on uneven surface  activity   Assist Walk 10 feet on uneven surfaces activity did not occur: Safety/medical concerns         Wheelchair     Assist Is the patient using a wheelchair?: Yes Type of Wheelchair: Manual    Wheelchair assist level: Moderate  Assistance - Patient 50 - 74% Max wheelchair distance: 64ft (Using RUE & RLE)    Wheelchair 50 feet with 2 turns activity    Assist        Assist Level: Moderate Assistance - Patient 50 - 74%   Wheelchair 150 feet activity     Assist      Assist Level: Maximal Assistance - Patient 25 - 49%   Blood pressure 123/69, pulse 73, temperature 98 F (36.7 C), resp. rate 16, height 6\' 2"  (1.88 m), SpO2 96 %.  Medical Problem List and Plan: 1. Functional deficits secondary to subacute right MCA ischemic infarction             -patient may  shower             -ELOS/Goals:  12/27, family hoping to d/c prior to 12/25 Continue CIR PT/OT/SLP -possible SNF discharge Is medically stable   2.  Antithrombotics: -DVT/anticoagulation:  Pharmaceutical: Eliquis             -antiplatelet therapy: Aspirin 81 mg daily 3. Pain Management: continue Neurontin 300 mg 3 times daily, reduce nortriptyline to 50mg  mg nightly, Flexeril 10 mg nightly as needed             off gabapentin due to drowsiness no worsening of pain 12/15  -improved on low dose lyrica 25mg  BID for neuropathic pain 4. Depression: continue Lexapro 10 mg nightly, nortriptyline.  Provide emotional support             -antipsychotic agents: N/A 5. Neuropsych/cognition: This patient is capable of making decisions on his own behalf. 6. Skin/Wound Care: Routine skin checks 7. Fluids/Electrolytes/Nutrition: Routine in and outs with follow-up chemistries 8.  Atrial fibrillation/AICD.  Continue amiodarone 200 mg daily as well as Eliquis.  Cardiac rate controlled 9.  Diabetes mellitus.  Hemoglobin A1c 9.9.  Decrease semglee to 16U.  SSI. Check blood sugars before meals and at bedtime -12/28 decrease semglee to 10u CBG (last 3)  Recent Labs    06/16/22 1640 06/16/22 2049 06/17/22 0618  GLUCAP 120* 122* 90   Controlled 12/28  10.  Hyperlipidemia. continue Lipitor 40mg  daily 11.  Chronic systolic congestive heart failure.   Monitor for any signs of fluid overload. On Furosemide 20mg  daily.  Fluid intake poor will check BMET check daily weight   12.  BPH.  Hytrin 5 mg twice daily- reduced to 1mg  BID due to soft BP  -Stop terazosin, monitor bladder function, flomax may be better option if needed   Will start flomax - reduce nortriptyline since this may contribute to retention  13.  Tobacco use.  Provide counseling 14.  Hypothyroidism.  Synthroid 15.  CKD.  Baseline creatinine 1.43-1.94.  Follow-up chemistries  -Recheck is stable 16. HTN. Long term goal normotensive. Home medications metoprolol and cozaar currently on hold. Monitor with activity. Vitals:   06/16/22 1928 06/17/22 0356  BP: 115/78 123/69  Pulse: 84 73  Resp: 18 16  Temp: 97.9 F (36.6 C) 98 F (36.7 C)  SpO2: 98% 96%  May reduce terazosin 1mg  BID  BMET today  24ml .45 NS bolus on 12/9- ECHO showed EF 60-65%- ok BP well controlled 12/27  17. Hypothyroidism. Continue synthroid   18.  E coli UTI, completed keflex  -recurrent , >100KGNR  pansensitive on amoxil 250 mg TID.  Will Rx x 10d due to urinary retention 19. Orthostatic Hypotension  -stop terazosin due to orthostatic, monitor- reduce nortriptyline to 10mg  20.  Urinary retention started on flomax 12/26 LOS: 22 days A FACE TO [facility] Kathy Wahid 06/17/2022, 7:47 AM

## 2022-06-17 NOTE — Patient Care Conference (Signed)
Inpatient RehabilitationTeam Conference and Plan of Care Update Date: 06/16/22   Time: 10:19 AM    Patient Name: Grant [name]      Medical Record Number: [id number]  Date of Birth: 08-28-1945 Sex: Male         Room/Bed: 4M13C/4M13C-01 Payor Info: Payor: [employer] / Plan: [facility] / Product Type: *No Product type* /    Admit Date/Time:  05/26/2022  6:32 PM  Primary Diagnosis:  Right middle cerebral artery stroke [facility])  Hospital Problems: Principal Problem:   Right middle cerebral artery stroke [facility])    Expected Discharge Date: Expected Discharge Date:  (SNF pending)  Team Members Present: Physician leading conference: Dr. Alysia Penna Social Worker Present: Erlene Quan, BSW Nurse Present: Dorien Chihuahua, RN PT Present: Page Spiro, PT OT Present: Meriel Pica, OT SLP Present: Charolett Bumpers, SLP PPS Coordinator present : Gunnar Fusi, SLP     Current Status/Progress Goal Weekly Team Focus  Bowel/Bladder   pt continent of bowel and bladder. bladder urgency at times.   Remain continent and assist with q2-4 voiding schedule for urgency   Continue to assist with needs qshift and prn    Swallow/Nutrition/ Hydration               ADL's   MIN A for bathing from shower level, stedy for shower transfers d/t inconsistency, MIN- MOD A for dressing d/t L neglect and impaired LUE proprioception. MIN A- MOD A for stand piots as mobility fluctutates. mild dizziness reported with mobility on 12/26 btu not as bad as it has been. wife can only provide supervision at home   MIN A MOD   BADL reeducation, functional mobility, balance, body awareness    Mobility   supervision bed mobility, CGA/min assist sit<>stand and stand pivot transfers using RW, CGA/min assist gait up to 133ft using RW; however, if removed RW pt requires mod/max assist for balance due to persistent posterior lean/LOB, max assist for 4 stair navigation using B HRs    downgraded to min assist overall at ambulatory level, CGA w/c propulsion, and mod assist stair navigation  transfer training, dynamic gait training, DME education/training, pt/family education, static and dynamic standing balance, L hemibody NMR    Communication                Safety/Cognition/ Behavioral Observations  min-mod A   min-to-mod A (goals downgraded 12/19)        Pain   no c/o pain   Remain pain free   Assess pain qshift and prn    Skin   Skin intact   Maintain skin integrity  Assess qshift and prn      Discharge Planning:  Anticipating discharge to SNF   Team Discussion: Patient making gains, working on sustained functional gains and has difficulty multi-tasking. UTI treated, BP managed and continues to be limited by cognition, perception deficits.  Patient on target to meet rehab goals: Yes, currently needs CGA for sit - stand and supervision for stand pivot. Showering with min - mod assist and bathing, dressing and transfers have improved. Still requires mod assist to ascend steps and mod - max to descend steps  *See Care Plan and progress notes for long and short-term goals.   Revisions to Treatment Plan:  Downgraded goals for cognition; Min - mod assist  Teaching Needs: Safety, medications, dietary modifications, transfers, toileting, etc.  Current Barriers to Discharge: Decreased caregiver support and Home enviroment access/layout  Possible Resolutions to Barriers: Family education completed  Ramp recommended for entry to home [facility] follow up services if not SNF placement Recommend 24/7 physical assist up to Mod assist on bad days Non emergent medical transport     Medical Summary Current Status: recurrent UTI, neuropathic pain, incont with urinary retention  Barriers to Discharge: Medical stability;Neurogenic Bowel & Bladder  Barriers to Discharge Comments: neurogenic pain Possible Resolutions to Barriers/Weekly Focus: await urine  culture       I attest that I was present, lead the team conference, and concur with the assessment and plan of the team.   Chana Bode B 06/17/2022, 8:40 AM

## 2022-06-17 NOTE — Progress Notes (Signed)
Occupational Therapy Session Note  Patient Details  Name: Grant [name] MRN: [id number] Date of Birth: [birth year]  Today's Date: 06/17/2022 OT Individual Time: 1303-1350 OT Individual Time Calculation (min): 47 min    Short Term Goals: Week 3:  OT Short Term Goal 1 (Week 3): STGs=LTGs due to pt's length of stay.  Skilled Therapeutic Interventions/Progress Updates:  Pt greeted seated in w/c, pt agreeable to OT intervention. Total A transport to gym, session focused on standing balance/tolerance, LUE coordination, L sided attention and visual perceptual skills.    Pt given leggos and visual aid with pt instructed to use visual aid to recreate leggo structure with BUEs. Pt stood with RW with CGA however pt reported dizziness with BP assessed ( 119/83 ( 94) HR 87) and task finished from sitting.  Pt stated wrong colors seen in pictures noted to have some visual perceptual deficits with pt noted to put blocks on upside down. Pt was able to use BUEs with no cues with an emphasis on LUE coordination. Pt was initially able to correctly create R side of structure correctly, but needed cues to problem solve L side. By the end, pt only misplaced on block in structure however colors were off.          Suspect impaired visual awareness with pt reporting image looks blurry.            Ended session with pt seated in w/c with all needs within reach and safety belt alarm activated.                    Therapy Documentation Precautions:  Precautions Precautions: Fall Precaution Comments: LT wrist pain with WB (fall 2 months ago on hand. No imaging) Restrictions Weight Bearing Restrictions: No    Pain: No pain    Therapy/Group: Individual Therapy  Barron Schmid 06/17/2022, 2:27 PM

## 2022-06-17 NOTE — Progress Notes (Signed)
Occupational Therapy Session Note  Patient Details  Name: Grant [name] MRN: [id number] Date of Birth: Oct 28, 1945  Today's Date: 06/17/2022 OT Individual Time: 1050-1200 OT Individual Time Calculation (min): 70 min    Short Term Goals: Week 3:  OT Short Term Goal 1 (Week 3): STGs=LTGs due to pt's length of stay.  Skilled Therapeutic Interventions/Progress Updates:  Pt received resting in for skilled OT session with focus on ADL retraining and standing balance/tolerance. Pt agreeable to interventions, demonstrating overall pleasant mood. Pt with no reports of pain. OT offering intermediate rest breaks and positioning suggestions throughout session to address pain/fatigue and maximize participation/safety in session.   Pt performs multiple STS transfers during session to complete LB dressing, functional transfers, and therapeutic activity in gym. Pt requiring varying levels of A, overall CGA-Min A, consistently needing heavy cuing for safe hand placement/RW management.   Pt performs sup>sit EOB with increased time, HOB elevated/use of bed rail, and verbal cuing for encouragement. Sitting EOB, pt able to thread bilateral legs into brief/pants with intermediate A for material management. Pt doffs/dons bilat socks with supervision/increased time.   In standing with Min-Mod A, pt requires Mod A to don brief/pants over bottom/hips. Pt attempting to use both hands to assist with donning, despite heavy multimodal cuing to place hands on walker for standing balance. Pt demonstrating strong (R) posterior lean when BUEs not supported on RW, requiring therapist to facilitate abrupt sit. Pt dons slip-on shoes with Mod A for heels.   Pt dependent for WC transport from room<>therapy gym for time management. In therapy gym, pt participates in standing balance/tolerance activity, placing focus on functional reach while maintaining grasp on RW for safety. Pt and OT discuss the importance of not letting go  of walker when performing standing activities as a means of increasing independence while maintaining safety. Pt completes activity with overall CGA-Mod A for standing balance, requiring intermediate seated rest breaks.   Pt remained seated in [facility] with nursing present, all immediate needs met, and lap belt alarmed at end of session. Pt continues to be appropriate for skilled OT intervention to promote further functional independence.   Therapy Documentation Precautions:  Precautions Precautions: Fall Precaution Comments: LT wrist pain with WB (fall 2 months ago on hand. No imaging) Restrictions Weight Bearing Restrictions: No    Therapy/Group: Individual Therapy  Maudie Mercury, OTR/L, MSOT  06/17/2022, 6:27 AM

## 2022-06-17 NOTE — Progress Notes (Addendum)
Patient ID: Grant [name], male   DOB: [birth year], 76 y.o.   MRN: [id number]  Sw has not received determination from [facility]. Community screening checklist submitted to [place] as back up plan for short-term SNF option. SW waiting on determination from [facility] and[facility]or [facility].   3:12PM: Sw received follow up from [facility] with [place] determination for short term rehab. Patient has been approved for 30 day [employer] contract. [facility] will share the list of additional facilities that the patient is eligible for and SW will share with patient spouse to review. No additional questions or concerns.  3:23PM: additional SNF resources shared with patient spouse to review.

## 2022-06-17 NOTE — Progress Notes (Signed)
Speech Language Pathology Daily Session Note  Patient Details  Name: Grant [name] MRN: [id number] Date of Birth: 1945/10/14  Today's Date: 06/17/2022 SLP Individual Time: 7654-6503 SLP Individual Time Calculation (min): 45 min  Short Term Goals: Week 4: SLP Short Term Goal 1 (Week 4): STG=LTG due to awaiting SNF placement  Skilled Therapeutic Interventions: Pt seen this date for skilled ST intervention targeting cognitive goals outlined in care plan. Pt greeting sleeping in bed; required increased time and stimulus to arouse. Agreeable to intervention with encouragement. Once awake, pt able to maintain adequate alertness and attention to task, for up to 20 minutes, with Sup-Min A cues. Of note, pt did demonstrate increased difficulty with higher-level attention within functional tasks (e.g. alternating attention).  Today's session with emphasis on cognitive skill development within the context of functional IADL tasks. Pt completed BID pill organizer task for three current medications, with 100% accuracy, given overall Mod faded to Min A verbal cues for accurate placement. Benefited from overall Mod A for visual scanning to L when reading medications off of medication list and Min-Mod A verbal and gesture cues for basic, functional problem-solving during medication management task (opening/closing non-conventional medication bottles (flip top), locating pill bottles on L side, opening boxes to pill organizer, locating dropped pills in lap). Reinforced need for assistance and supervision with aforementioned task upon d/c; he verbalized understanding, though did demonstrate decreased awareness re: level of assistance needed to complete this task successfully. Additionally, assisted pt with calling meal service for new breakfast tray given pt did not eat his meal this morning. Pt required overall Mod A visual and verbal cues to locate phone number on menu, Min A for problem-solving with room  phone (locating on/off switch), and Total A for dialing number given poor alternating attention, working memory, and visual attention. With this level of assistance, pt able to verbalize his order without additional cueing.  Pt left in room and sitting upright in bed with all safety measures activated and call bell within reach. Notified NT of personal care needs of pt (needed to use urinal). Continue per current ST POC.  Pain Reports pain in L leg; LPN notified. SLP provided repositioning , supportive listening, validation, and distraction as pain management interventions.  Therapy/Group: Individual Therapy  Grant [name] 06/17/2022, 1:53 PM

## 2022-06-17 NOTE — Progress Notes (Signed)
Physical Therapy Session Note  Patient Details  Name: Grant [name] MRN: [id number] Date of Birth: 06/06/1946  Today's Date: 06/17/2022 PT Individual Time: 1415-1445 PT Individual Time Calculation (min): 30 min   Short Term Goals: Week 3:  PT Short Term Goal 1 (Week 3): Pt will ambulate 75ft with CGA and no LOB PT Short Term Goal 2 (Week 3): Pt will trasnfer to WC with CGA consistently PT Short Term Goal 3 (Week 3): Pt will tolerate standing >3 mintues to imrpove safety with ADLS and CGA  Skilled Therapeutic Interventions/Progress Updates:  Patient greeted sitting in TIS in his room on the phone with family and agreeable to PT treatment session. Patient requested to brush his hair prior to heading to the rehab gym. Patient wheeled dependently to the gym for time management. Patient gait trained 2 x 150' with RW and CGA/MinA for improved stability/safety. During initial gait trial, patient required VC for stepping within the frame of the walker and not speeding up once he see his wheelchair. With these cues, patient demonstrated improved safety and gait mechanics during the second gait trial, however had increased difficulty when stepping backwawrds to his chair causing and posterior LOB and Mod/MaxA for safely guiding the patient into the chair. Patient required an extended seated rest break in between each gait trial secodnary to fatigue. Patient performed various sit/stands with RW and CGA with good recall of proper hand placmeent. Patient returned to his room sitting upright in TIS withb posey belt on, call belll within reach and all needs met.    Therapy Documentation Precautions:  Precautions Precautions: Fall Precaution Comments: LT wrist pain with WB (fall 2 months ago on hand. No imaging) Restrictions Weight Bearing Restrictions: No  Therapy/Group: Individual Therapy  Sydney  Rafoth 06/17/2022, 7:57 AM  

## 2022-06-18 LAB — GLUCOSE, CAPILLARY
Glucose-Capillary: 98 mg/dL (ref 70–99)
Glucose-Capillary: 103 mg/dL — ABNORMAL HIGH (ref 70–99)
Glucose-Capillary: 116 mg/dL — ABNORMAL HIGH (ref 70–99)
Glucose-Capillary: 135 mg/dL — ABNORMAL HIGH (ref 70–99)

## 2022-06-18 NOTE — Progress Notes (Signed)
Physical Therapy Session Note  Patient Details  Name: Grant [name] MRN: [id number] Date of Birth: 07/01/1945  Today's Date: 06/18/2022 PT Individual Time: 1530-1620 PT Individual Time Calculation (min): 50 min   Short Term Goals: Week 2:  PT Short Term Goal 1 (Week 2): Pt will trasnfer to [facility] with CGA consistently PT Short Term Goal 1 - Progress (Week 2): Not met PT Short Term Goal 2 (Week 2): Pt will ambulate 47f with CGA and no LOB PT Short Term Goal 2 - Progress (Week 2): Not met PT Short Term Goal 3 (Week 2): Pt will tolerate standing >3 mintues to imrpove safety with ADLS and CGA PT Short Term Goal 3 - Progress (Week 2): Not met Week 3:  PT Short Term Goal 1 (Week 3): Pt will ambulate 78fwith CGA and no LOB PT Short Term Goal [facility] (Week 3): [facility] PT Short Term Goal 3 (Week 3): Pt will tolerate standing >3 mintues to imrpove safety with ADLS and CGA  Skilled Therapeutic Interventions/Progress Updates:  Patient seated upright in TIS w/c on entrance to room. Pt's two nieces are present in room. Have informed pt that their mother is admitted to hospital as well. Patient alert and agreeable to PT session.   Patient with no pain complaint at start of session.  Therapeutic Activity/ NMR: Transfers: Pt performed sit<>stand and stand pivot transfers throughout session with no AD. Provided vc/tc for forward lean, more posterior foot placement and managing balance more over toes prior to ambulation. Initially requiring Min/ ModA to rise to full upright stance with balance and improving to CGA/ MinA following blocked practice x6.   Gait Training/ NMR:  NMR facilitated during session with focus on dynamic gait. Pt ambulated 35 feet per bout around cones placed in wide slalom requiring tight turn. Has balance prior to initiating gait and is able to maneuver cones with no LOB with no AD. One misstep with pt self correcting and maintaining balance with  correct stepping strategy. Performed x4 with CGA. NMR performed for improvements in motor control and coordination, balance, sequencing, judgement, and self confidence/ efficacy in performing all aspects of mobility at highest level of independence.   Patient seated in TIS w/c at end of session with brakes locked, belt alarm set, and all needs within reach.   Therapy Documentation Precautions:  Precautions Precautions: Fall Precaution Comments: LT wrist pain with WB (fall 2 months ago on hand. No imaging) Restrictions Weight Bearing Restrictions: No General:   Vital Signs: Therapy Vitals Temp: 98.2 F (36.8 C) Temp Source: Oral Pulse Rate: 87 Resp: 18 BP: 110/66 Patient Position (if appropriate): Sitting Oxygen Therapy SpO2: 92 % O2 Device: Room Air Pain:  No pain indicated throughout session.   Therapy/Group: Individual Therapy  JuAlger SimonsT, DPT, CSRS 06/18/2022, 6:19 PM

## 2022-06-18 NOTE — Progress Notes (Addendum)
Patient ID: Grant [name], male   DOB: 30-Mar-1946, 76 y.o.   MRN: [id number]  Sw called pt spouse, Grant [name] to follow up on SNF options. Spouse shares that patient expressed that he would like to go home. Spouse considering taking patient home based on what she is able to see pt do today and over the weekend. SW will inform staff. Sw informed spouse if patient returns home and she feels that she is unable to manage at that point she will need to reach out to [place] PCP to start SNF placement options.   SW will reach out to [facility] in case spouse changes her decision.  3:58 PM SW left VM for AD Jeffery to review patient referral on hub.

## 2022-06-18 NOTE — Progress Notes (Signed)
Occupational Therapy Weekly Progress Note  Patient Details  Name: Grant [name] MRN: [id number] Date of Birth: 07/09/1945  Beginning of progress report period: June 11, 2022 End of progress report period: June 12, 2022  Today's Date: 06/18/2022 OT Individual Time: 1105-1135 OT Individual Time Calculation (min): 30 min   Today's Date: 06/18/2022 OT Individual Time: 1305-1420 OT Individual Time Calculation (min): 75 min   Patient making some progress towards LTGs this reporting period. Pt continues to require varying levels of assistance with basic ADLs and functional mobility dependent upon fluctuating physical and cognitive abilities. Pt demonstrating increased independence with functional transfers, requiring CGA-Min A + Min-Mod cuing, showing carry-over of education provided for safe hand placement on RW. Pt currently awaiting SNF placement, treatment team in constant communication with patient's wife.   Patient continues to demonstrate the following deficits: muscle weakness, decreased cardiorespiratoy endurance, decreased motor planning, decreased attention to left, decreased problem solving, decreased safety awareness, and decreased memory, and decreased standing balance and therefore will continue to benefit from skilled OT intervention to enhance overall performance with BADL and Reduce care partner burden.  Patient progressing toward long term goals..  Continue plan of care.  OT Short Term Goals Week 4:  OT Short Term Goal 1 (Week 4): STGs=LTGs due to patient's length of stay.  Skilled Therapeutic Interventions/Progress Updates:   Session 1: Pt received seated in WC for skilled OT session with focus on L-hand strengthening/awareness. Pt agreeable to interventions, demonstrating overall pleasant mood. Pt with no reports of pain, stating "I could be better. . . Maybe like I was 50 years ago." OT offering intermediate rest breaks and positioning suggestions throughout  session to address potential pain/fatigue and maximize participation/safety in session.   Pt dependent for TIS WC transport from room<>therapy gym. In therapy gym, pt participates in series of L-hand strengthening exercises with use of tan thera-putty. Pt performs the following exercises for 1 set/5-10 reps:  -Gross Hand and Wrist Movement (rolling a cylinder shape) -Gross Finger Flexion (squeezing putty into a ball) -Isolated Opposition -Individual Finger Adduction  Pt remained seated in WC with all immediate needs met and lap belt alarmed at end of session. Pt continues to be appropriate for skilled OT intervention to promote further functional independence.   Session 2:  Pt received seated in WC for skilled OT session with focus on LUE/LLE NMR/weight-bearing/weight-shifting. Pt agreeable to interventions, demonstrating overall pleasant mood. Pt with no reports of pain, stating "no not yet" in reference to pain. OT offering intermediate rest breaks and positioning suggestions throughout session to address potential pain/fatigue and maximize participation/safety in session.   Pt dependent for TIS WC transport from room <> day room. In day room, pt participates in table-top activity targeting standing tolerance/balance and LLE WB/weight-shifting through staggered stance and functional reaching. Pt benefiting from visual targets to maintain stance, completing multiple trials, each lasting ~2 mins, with overall Min-Mod A + RW. Pt asked to reach with alternating BUEs for L-side awareness/NMR, with focus on maintaining one hand on RW for proprioreceptive input as patient's balance becomes greatly impaired with both hands off the RW.   Pt then simulates LB dressing through looped thera-band activity. Pt able to thread BLEs through thera-band seated with CGA for dynamic sitting balance (pt's with report of lightheadedness-to trial reacher next session) and increased time. In standing with Min-Mod A + RW, pt  pulls theraband over bottom and hips, requiring heavy multimodal cuing to alternate use of hands for improved balance.     In patient's room, pt tolerates ~3 mins of static standing with close supervision + RW to dependently complete brief change for time.   Pt remained seated in [facility] with all immediate needs met and lap belt alarmed at end of session. Pt continues to be appropriate for skilled OT intervention to promote further functional independence.    Therapy Documentation Precautions:  Precautions Precautions: Fall Precaution Comments: LT wrist pain with WB (fall 2 months ago on hand. No imaging) Restrictions Weight Bearing Restrictions: No   Therapy/Group: Individual Therapy  Maudie Mercury, OTR/L, MSOT  06/18/2022, 7:58 AM

## 2022-06-18 NOTE — Progress Notes (Signed)
PROGRESS NOTE   Subjective/Complaints:  No pains overnite , slept well   ROS- denies CP, SOB, N/V/D, abdominal pain, HA, cough, dizziness, dysuria   Objective:   No results found. No results for input(s): "WBC", "HGB", "HCT", "PLT" in the last 72 hours.   No results for input(s): "NA", "K", "CL", "CO2", "GLUCOSE", "BUN", "CREATININE", "CALCIUM" in the last 72 hours.    Intake/Output Summary (Last 24 hours) at 06/18/2022 0803 Last data filed at 06/17/2022 1812 Gross per 24 hour  Intake 120 ml  Output --  Net 120 ml         Physical Exam: Vital Signs Blood pressure 109/64, pulse 71, temperature 97.9 F (36.6 C), temperature source Oral, resp. rate 16, height 6\' 2"  (1.88 m), SpO2 96 %.   General: No acute distress Mood and affect are appropriate Heart: Regular rate and rhythm no rubs murmurs or extra sounds Lungs: Clear to auscultation, breathing unlabored, no rales or wheezes Abdomen: Positive bowel sounds, soft nontender to palpation, nondistended Extremities: No clubbing, cyanosis, or edema   Neurologic: Alert and oriented x3,  follows commands, Cranial nerves II through XII grossly intact, motor strength is 4-4+/5 in bilateral deltoid, bicep, tricep, grip, hip flexor, knee extensors, ankle dorsiflexor and plantar flexor Sensory exam reduced  sensation to light touch  in right upper and left , has intact proprioception LLE  Musculoskeletal: right hand intrinsic atrophy    Assessment/Plan: 1. Functional deficits which require 3+ hours per day of interdisciplinary therapy in a comprehensive inpatient rehab setting. Physiatrist is providing close team supervision and 24 hour management of active medical problems listed below. Physiatrist and rehab team continue to assess barriers to discharge/monitor patient progress toward functional and medical goals  Care Tool:  Bathing    Body parts bathed by  patient: Chest, Abdomen, Right upper leg, Left upper leg, Buttocks, Face, Left arm   Body parts bathed by helper: Front perineal area, Left lower leg, Right lower leg, Right arm     Bathing assist Assist Level: Moderate Assistance - Patient 50 - 74%     Upper Body Dressing/Undressing Upper body dressing   What is the patient wearing?: Pull over shirt    Upper body assist Assist Level: Moderate Assistance - Patient 50 - 74%    Lower Body Dressing/Undressing Lower body dressing      What is the patient wearing?: Underwear/pull up, Pants     Lower body assist Assist for lower body dressing: Moderate Assistance - Patient 50 - 74%     Toileting Toileting    Toileting assist Assist for toileting: Moderate Assistance - Patient 50 - 74%     Transfers Chair/bed transfer  Transfers assist     Chair/bed transfer assist level: Minimal Assistance - Patient > 75%     Locomotion Ambulation   Ambulation assist      Assist level: Minimal Assistance - Patient > 75% Assistive device: Walker-rolling (required assitance w/ maintaing left hand on walker and RW navigation) Max distance: 44ft   Walk 10 feet activity   Assist     Assist level: Minimal Assistance - Patient > 75% (required assitance w/ maintaing left hand on walker  and RW navigation) Assistive device: Walker-rolling   Walk 50 feet activity   Assist    Assist level: Minimal Assistance - Patient > 75% (required assitance w/ maintaing left hand on walker and RW navigation) Assistive device: Walker-rolling    Walk 150 feet activity   Assist Walk 150 feet activity did not occur: Safety/medical concerns         Walk 10 feet on uneven surface  activity   Assist Walk 10 feet on uneven surfaces activity did not occur: Safety/medical concerns         Wheelchair     Assist Is the patient using a wheelchair?: Yes Type of Wheelchair: Manual    Wheelchair assist level: Moderate Assistance -  Patient 50 - 74% Max wheelchair distance: 64ft (Using RUE & RLE)    Wheelchair 50 feet with 2 turns activity    Assist        Assist Level: Moderate Assistance - Patient 50 - 74%   Wheelchair 150 feet activity     Assist      Assist Level: Maximal Assistance - Patient 25 - 49%   Blood pressure 109/64, pulse 71, temperature 97.9 F (36.6 C), temperature source Oral, resp. rate 16, height 6\' 2"  (1.88 m), SpO2 96 %.  Medical Problem List and Plan: 1. Functional deficits secondary to subacute right MCA ischemic infarction             -patient may  shower             -ELOS/Goals:  approved for SNF by [facility], family reviewing approved facility Continue CIR PT/OT/SLP -possible SNF discharge Is medically stable   2.  Antithrombotics: -DVT/anticoagulation:  Pharmaceutical: Eliquis             -antiplatelet therapy: Aspirin 81 mg daily 3. Pain Management: continue Neurontin 300 mg 3 times daily, reduce nortriptyline to 50mg  mg nightly, Flexeril 10 mg nightly as needed             off gabapentin due to drowsiness no worsening of pain 12/15  -improved on low dose lyrica 25mg  BID for neuropathic pain 4. Depression: continue Lexapro 10 mg nightly, nortriptyline reduced to 10mg  due to hx of urinary retention.  Provide emotional support             -antipsychotic agents: N/A 5. Neuropsych/cognition: This patient is capable of making decisions on his own behalf. 6. Skin/Wound Care: Routine skin checks 7. Fluids/Electrolytes/Nutrition: Routine in and outs with follow-up chemistries 8.  Atrial fibrillation/AICD.  Continue amiodarone 200 mg daily as well as Eliquis.  Cardiac rate controlled 9.  Diabetes mellitus.  Hemoglobin A1c 9.9.  Decrease semglee to 16U.  SSI. Check blood sugars before meals and at bedtime -12/28 decrease semglee to 10u CBG (last 3)  Recent Labs    06/17/22 1652 06/17/22 2103 06/18/22 0608  GLUCAP 86 133* 98   Controlled 12/28  10.  Hyperlipidemia.  continue Lipitor 40mg  daily 11.  Chronic systolic congestive heart failure.  Monitor for any signs of fluid overload. On Furosemide 20mg  daily.  Fluid intake poor will check BMET check daily weight   12.  BPH.  Hytrin 5 mg twice daily- reduced to 1mg  BID due to soft BP  -Stop terazosin, monitor bladder function, flomax may be better option if needed   Will start flomax - reduce nortriptyline since this may contribute to retention  13.  Tobacco use.  Provide counseling 14.  Hypothyroidism.  Synthroid 15.  CKD.  Baseline  creatinine 1.43-1.94.  Follow-up chemistries  -Recheck is stable 16. HTN. Long term goal normotensive. Home medications metoprolol and cozaar currently on hold. Monitor with activity. Vitals:   06/17/22 1940 06/18/22 0442  BP: 122/85 109/64  Pulse: 77 71  Resp: 16 16  Temp: 97.6 F (36.4 C) 97.9 F (36.6 C)  SpO2: 95% 96%  May reduce terazosin 1mg  BID  BMET today  280ml .45 NS bolus on 12/9- ECHO showed EF 60-65%- ok BP well controlled 12/27  17. Hypothyroidism. Continue synthroid   18.  E coli UTI, completed keflex  -recurrent , >100KGNR  pansensitive on amoxil 250 mg TID.  Will Rx x 10d due to urinary retention 19. Orthostatic Hypotension  -stop terazosin due to orthostatic, monitor- reduce nortriptyline to 10mg  20.  Urinary retention started on flomax 12/26 LOS: 23 days A FACE TO [place] E Yeilin Zweber 06/18/2022, 8:03 AM

## 2022-06-18 NOTE — Progress Notes (Signed)
Physical Therapy Session Note  Patient Details  Name: Chinedum Vanhouten MRN: [id number] Date of Birth: 1946-01-09  Today's Date: 06/18/2022 PT Individual Time: 0905-1006 PT Individual Time Calculation (min): 61 min   Short Term Goals: Week 1:  PT Short Term Goal 1 (Week 1): Pt wil perfrom bed/chair transfers w/ supervison assist using LRAD PT Short Term Goal 1 - Progress (Week 1): Not met PT Short Term Goal 2 (Week 1): Pt ambulate 75 ft w/ supervsion assist using LRAD PT Short Term Goal 2 - Progress (Week 1): Not met PT Short Term Goal 3 (Week 1): Pt will naviagte 4 6 inch steps w/ min A while using hand rails PT Short Term Goal 3 - Progress (Week 1): Not met PT Short Term Goal 4 (Week 1): Pt wil perfrom  functional otucome measure to predict fall risk PT Short Term Goal 4 - Progress (Week 1): Met PT Short Term Goal 5 (Week 1): Pt will perfrom bed mobility at supervison level conistently PT Short Term Goal 5 - Progress (Week 1): Not met Week 2:  PT Short Term Goal 1 (Week 2): Pt will trasnfer to [facility] with CGA consistently PT Short Term Goal 1 - Progress (Week 2): Not met PT Short Term Goal 2 (Week 2): Pt will ambulate 73f with CGA and no LOB PT Short Term Goal 2 - Progress (Week 2): Not met PT Short Term Goal 3 (Week 2): Pt will tolerate standing >3 mintues to imrpove safety with ADLS and CGA PT Short Term Goal 3 - Progress (Week 2): Not met Week 3:  PT Short Term Goal 1 (Week 3): Pt will ambulate 786fwith CGA and no LOB PT Short Term Goal 2 (Week 3): [facility] PT Short Term Goal 3 (Week 3): Pt will tolerate standing >3 mintues to imrpove safety with ADLS and CGA  Skilled Therapeutic Interventions/Progress Updates:   Pt received supine In bed attmepting to eat breakfast. Pt then agreeable to PT. Supine>sit with supervision assist from PT. No LOB while sitting EOB. Min assist for management of lids on coffee and while sitting EOB and attention to  the LUE while using UE to manage food.   Dressing at EOB with supervision assist-CGA for safety to prevent 1 near LOB while donning R shoe. Pt continues to be unaware of near falls with poor safety awareness   Gait training in rehab gym x 20069fith. CGA- min assist due to fatigue for last 83f12fd increased foot drag assist; performed with with RW and cues for decreased speed of turns.   Dynamic balance training to complete LOW AND moderate difficulty peg board puzzle while standing on red wedge. CGA for balance with cues for BLE position and improved WB through the forefoot. Pt reports mild dizziness after standing on feet for ~ 4.5 MIN with moderate difficulty puzzle   Patient returned to room and [facility] and all needs met.        Therapy Documentation Precautions:  Precautions Precautions: Fall Precaution Comments: LT wrist pain with WB (fall 2 months ago on hand. No imaging) Restrictions Weight Bearing Restrictions: No  Pain: denies    Therapy/Group: Individual Therapy  AustLorie Phenix29/2023, 10:08 AM

## 2022-06-19 LAB — GLUCOSE, CAPILLARY
Glucose-Capillary: 108 mg/dL — ABNORMAL HIGH (ref 70–99)
Glucose-Capillary: 110 mg/dL — ABNORMAL HIGH (ref 70–99)
Glucose-Capillary: 93 mg/dL (ref 70–99)
Glucose-Capillary: 120 mg/dL — ABNORMAL HIGH (ref 70–99)

## 2022-06-19 NOTE — Progress Notes (Signed)
Physical Therapy Weekly Progress Note  Patient Details  Name: Grant [name] MRN: [id number] Date of Birth: [birth year]  Beginning of progress report period: June 11, 2022 End of progress report period: June 19, 2022  Today's Date: 06/19/2022 PT Individual Time: 0803-0912 PT Individual Time Calculation (min): 69 min   Patient has met 3 of 3 short term goals.  Pt is making slow progress towards CGA for all mobility. At times pt can move with CGA for gait and transfers with and without RW< but continues to demonstrate poor safety awareness and fluctuating functional mobility with moments of min-mod assist to prevent LOB, especially while distracted. Pt also demonstrates poor awareness of deficits and continually is impulsive with PT while performing transfers and hygiene on this day resulting in near LOB and assist required to prevent fall.   Patient continues to demonstrate the following deficits muscle weakness and muscle joint tightness, decreased cardiorespiratoy endurance, unbalanced muscle activation and decreased coordination, decreased visual perceptual skills, decreased attention to left, decreased attention, decreased awareness, decreased problem solving, decreased safety awareness, decreased memory, and delayed processing, and decreased sitting balance, decreased standing balance, decreased postural control, hemiplegia, and decreased balance strategies and therefore will continue to benefit from skilled PT intervention to increase functional independence with mobility.  Patient progressing toward long term goals..  Continue plan of care.  PT Short Term Goals Week 3:  PT Short Term Goal 1 (Week 3): Pt will ambulate 46f with CGA and no LOB PT Short Term Goal 1 - Progress (Week 3): Met PT Short Term Goal 2 (Week 3): [facility] PT Short Term Goal 2 - Progress (Week 3): Met PT Short Term Goal 3 (Week 3): Pt will tolerate standing >3 mintues  to imrpove safety with ADLS and CGA PT Short Term Goal 3 - Progress (Week 3): Met Week 4:  PT Short Term Goal 1 (Week 4): STG=LTG due to ELOS  Skilled Therapeutic Interventions/Progress Updates:     Pt received with BLE over EOB and noted to be attempting to transfer out of bed. Pt reporting " I drank too much last night" but not expounding on statement to allow this PT interpret meaning. Pt then asking to tranfer to bathroom. PT. Supine>sit transfer without assist and but cues for safety as rail in patients way of performing transfer.  Impulsively attempting to [facility], requiring cues for orientation to balance deficits 2/2 CVA.   Toilet transfer, but pt unable to void bladder or bowel sitting on toilet. Cues for safety throughout.  Gait training in rehab gym with no AD x 1551fwith min assist from PT with unsteady gait pattern due to coordination deficits on the LLE. Additional gait training with RW x 15083fith sCGA for initial 100f57fd min assist with two near LOB from foot drag on the LLE. Pt reports feeling "unstable" while sitting in WC following second bout of gait training.  Sitting BP 119/74 HR 80 Standing BP 160/134. HR 85 Sitting BP after 5 minute rest 106/73. HR 86   PT then assisting pt with set up breakfast. Cues for stabilization of utensil with LUE to cut food and positioning of cups to manage drink and syrups.   Pt left sitting in WC with call bell in reach and all needs met   Therapy Documentation Precautions:  Precautions Precautions: Fall Precaution Comments: LT wrist pain with WB (fall 2 months ago on hand. No imaging) Restrictions Weight Bearing  Restrictions: No  Pain:  denies  Therapy/Group: Individual Therapy  [name] 06/19/2022, 9:18 AM

## 2022-06-19 NOTE — Progress Notes (Signed)
Occupational Therapy Session Note  Patient Details  Name: Charlene Cowdrey MRN: [id number] Date of Birth: [birth year]  Today's Date: 06/19/2022 OT Individual Time: 1833-5825 OT Individual Time Calculation (min): 42 min    Short Term Goals: Week 4:  OT Short Term Goal 1 (Week 4): STGs=LTGs due to patient's length of stay.  Skilled Therapeutic Interventions/Progress Updates:    Pt received in wc ready for therapy. He said his wife was not coming today because she was ill.  Pt taken to the gym and had pt practice movements he would need to do at home as pt and family would like to reconsider SNF placement.  Today pt did extremely well.  He was able to consistently stand to RW with close S,  take steps to perform transfers with light CGA.  He demonstrated the ability to sit on mat, move to supine and back to sit with Supervision. On last transfer back to wc, he started to have a LOB backwards with min A to correct. Pt started leaning for the seat prior to stepping his feet back far enough.    His L grip strength continues to be functional at 45-50 lbs. Worked on using his eyes to compensate for decreased vision when reaching to the L for objects. Pt worked on standing with RW, using L hand to pick up cones, pass them to R hand and then dynamically reach to R side to place cones on low stool.  He held his balance with CGA,  L hand dropped about 25 % of the cones he was passing.  Sensation limitations limit his ability to know how hard to grip an object. Pt returned to his room and opted to stay in the wheelchair.  Chair alarm on and all needs met.   Therapy Documentation Precautions:  Precautions Precautions: Fall Precaution Comments: LT wrist pain with WB (fall 2 months ago on hand. No imaging) Restrictions Weight Bearing Restrictions: No   Pain: no c/o pain    ADL: ADL Eating: Set up Grooming: Setup Upper Body Bathing: Supervision/safety, Minimal cueing (using wash mit on L  hand) Where Assessed-Upper Body Bathing: Shower Lower Body Bathing: Minimal assistance (using long handled sponge) Where Assessed-Lower Body Bathing: Shower Upper Body Dressing: Minimal assistance, Moderate cueing Where Assessed-Upper Body Dressing: Wheelchair Lower Body Dressing: Moderate assistance, Moderate cueing Where Assessed-Lower Body Dressing: Wheelchair Toileting: Maximal assistance Where Assessed-Toileting: Glass blower/designer: Moderate assistance Toilet Transfer Method: Stand pivot     Therapy/Group: Individual Therapy  [place] 06/19/2022, 8:03 AM

## 2022-06-19 NOTE — Progress Notes (Signed)
Occupational Therapy Session Note  Patient Details  Name: Grant [name] MRN: [id number] Date of Birth: 1945/11/12  Today's Date: 06/19/2022 OT Individual Time: 3545-6256 OT Individual Time Calculation (min): 46 min   Today's Date: 06/19/2022 OT Individual Time: 3893-7342 OT Individual Time Calculation (min): 44 min   Short Term Goals: Week 3:  OT Short Term Goal 1 (Week 3): STGs=LTGs due to pt's length of stay.  Skilled Therapeutic Interventions/Progress Updates:   Session 1: Pt received seated in [facility] for skilled OT session with focus on standing tolerance/balance and LB simulated dressing. Pt agreeable to interventions, demonstrating overall pleasant mood. Pt with no reports of pain, stating "I feel like my (R) shoulder is pulling me down." OT offering intermediate rest breaks and positioning suggestions throughout session to address pain/fatigue and maximize participation/safety in session.   Pt dependent for TIS WC transport from room<>therapy gym. In therapy gym, pt participates in table-top activity targeting standing tolerance/balance and LLE WB/weight-shifting through staggered stance and functional reaching. Pt completing multiple trials lasting >2 mins with overall CGA-Min A+ RW, demonstrating improvement since previous session. Pt observed taking both hands off walker manipulate object used in activity without major LOB.   Pt then simulates LB dressing through looped thera-band activity. Pt able to thread BLEs through thera-band seated with supervision and figure-4 technique to avoid orthostatic symptoms when reaching down. In standing with CGA+ RW, pt pulls theraband over bottom and hips with carry-over from previous session of alternating hands.   Pt with complaints of shoulder discomfort, planning to further assess in later session.   [facility] with all immediate needs met at end of session. Pt continues to be appropriate for skilled OT intervention to  promote further functional independence.    Session 2: Pt received resting in bed for skilled OT session with focus on LUE NMR. Pt agreeable to interventions, demonstrating overall pleasant mood. Pt with no reports of pain, stating "my (L) arm feels like its being weighed down."  OT offering intermediate rest breaks and positioning suggestions throughout session to address pain/fatigue and maximize participation/safety in session.   Pt dependent for TIS WC transport from room<>therapy gym. In therapy gym, pt participates in series of table-top LUE AROM/WB exercises, completing each for 2 sets/10 reps: -Standing Shoulder protractio/retraction -Sitting elbow flexion/extension (.75lb wrist weight)  Pt becoming fatigued in standing, requiring increased A to maintain stance. Pt completing remainder of exercises in sitting for energy conservation/safety.   Pt remained seated in [facility] with all immediate needs met at end of session. Pt continues to be appropriate for skilled OT intervention to promote further functional independence.   Therapy Documentation Precautions:  Precautions Precautions: Fall Precaution Comments: LT wrist pain with WB (fall 2 months ago on hand. No imaging) Restrictions Weight Bearing Restrictions: No    Therapy/Group: Individual Therapy  Maudie Mercury, OTR/L, MSOT  06/19/2022, 7:39 AM

## 2022-06-20 LAB — GLUCOSE, CAPILLARY
Glucose-Capillary: 92 mg/dL (ref 70–99)
Glucose-Capillary: 128 mg/dL — ABNORMAL HIGH (ref 70–99)
Glucose-Capillary: 118 mg/dL — ABNORMAL HIGH (ref 70–99)
Glucose-Capillary: 141 mg/dL — ABNORMAL HIGH (ref 70–99)

## 2022-06-20 NOTE — Progress Notes (Signed)
During medication administration. Nurse tech in room with patient obtaining CBG sample. Asking to go to the bathroom. Multiple prompts made and set up with the STEDY, Refused to use the STEDY. Allowed staff to place gait belt around him. With two person assist ambulated to the bathroom. Refusing initially to have staff assist him to the bathroom explained that we have to for safety reasons. Returned back to bed. While in bed expressed that he wants to be discharged on Wednesday. Explained to staff if not discharged by Wednesday will contact his Daughter to pick him up. Patient also endorsing plans to go back to current place of residence to live with his spouse. During this conversation patient making statements about taking a "boat to [place]." Then "going to [place]." Repositioned in bed and lights turned off. Awake and lying in bed at this time.  Tula Nakayama, LPN

## 2022-06-20 NOTE — Progress Notes (Signed)
PROGRESS NOTE   Subjective/Complaints:  Pt reports no issues- denies pain; slept great.  "Cannot complain".  ROS-  Pt denies SOB, abd pain, CP, N/V/C/D, and vision changes  Objective:   No results found. No results for input(s): "WBC", "HGB", "HCT", "PLT" in the last 72 hours.   No results for input(s): "NA", "K", "CL", "CO2", "GLUCOSE", "BUN", "CREATININE", "CALCIUM" in the last 72 hours.    Intake/Output Summary (Last 24 hours) at 06/20/2022 0952 Last data filed at 06/19/2022 1830 Gross per 24 hour  Intake 127 ml  Output --  Net 127 ml        Physical Exam: Vital Signs Blood pressure 111/73, pulse 74, temperature 98.1 F (36.7 C), temperature source Oral, resp. rate 16, height 6\' 2"  (1.88 m), weight 95.2 kg, SpO2 95 %.    General: awake, alert, appropriate, sitting up in bed; NAD HENT: conjugate gaze; oropharynx moist CV: regular rate; no JVD Pulmonary: CTA B/L; no W/R/R- good air movement GI: soft, NT, ND, (+)BS Psychiatric: appropriate- interactive Neurological: Ox3   Neurologic: Alert and oriented x3,  follows commands, Cranial nerves II through XII grossly intact, motor strength is 4-4+/5 in bilateral deltoid, bicep, tricep, grip, hip flexor, knee extensors, ankle dorsiflexor and plantar flexor Sensory exam reduced  sensation to light touch  in right upper and left , has intact proprioception LLE  Musculoskeletal: right hand intrinsic atrophy    Assessment/Plan: 1. Functional deficits which require 3+ hours per day of interdisciplinary therapy in a comprehensive inpatient rehab setting. Physiatrist is providing close team supervision and 24 hour management of active medical problems listed below. Physiatrist and rehab team continue to assess barriers to discharge/monitor patient progress toward functional and medical goals  Care Tool:  Bathing    Body parts bathed by patient: Chest, Abdomen,  Right upper leg, Left upper leg, Buttocks, Face, Left arm   Body parts bathed by helper: Front perineal area, Left lower leg, Right lower leg, Right arm     Bathing assist Assist Level: Moderate Assistance - Patient 50 - 74%     Upper Body Dressing/Undressing Upper body dressing   What is the patient wearing?: Pull over shirt    Upper body assist Assist Level: Moderate Assistance - Patient 50 - 74%    Lower Body Dressing/Undressing Lower body dressing      What is the patient wearing?: Underwear/pull up, Pants     Lower body assist Assist for lower body dressing: Moderate Assistance - Patient 50 - 74%     Toileting Toileting    Toileting assist Assist for toileting: Moderate Assistance - Patient 50 - 74%     Transfers Chair/bed transfer  Transfers assist     Chair/bed transfer assist level: Minimal Assistance - Patient > 75%     Locomotion Ambulation   Ambulation assist      Assist level: Minimal Assistance - Patient > 75% Assistive device: Walker-rolling (required assitance w/ maintaing left hand on walker and RW navigation) Max distance: 40ft   Walk 10 feet activity   Assist     Assist level: Minimal Assistance - Patient > 75% (required assitance w/ maintaing left hand on walker and  RW navigation) Assistive device: Walker-rolling   Walk 50 feet activity   Assist    Assist level: Minimal Assistance - Patient > 75% (required assitance w/ maintaing left hand on walker and RW navigation) Assistive device: Walker-rolling    Walk 150 feet activity   Assist Walk 150 feet activity did not occur: Safety/medical concerns         Walk 10 feet on uneven surface  activity   Assist Walk 10 feet on uneven surfaces activity did not occur: Safety/medical concerns         Wheelchair     Assist Is the patient using a wheelchair?: Yes Type of Wheelchair: Manual    Wheelchair assist level: Moderate Assistance - Patient 50 - 74% Max  wheelchair distance: 38ft (Using RUE & RLE)    Wheelchair 50 feet with 2 turns activity    Assist        Assist Level: Moderate Assistance - Patient 50 - 74%   Wheelchair 150 feet activity     Assist      Assist Level: Maximal Assistance - Patient 25 - 49%   Blood pressure 111/73, pulse 74, temperature 98.1 F (36.7 C), temperature source Oral, resp. rate 16, height 6\' 2"  (1.88 m), weight 95.2 kg, SpO2 95 %.  Medical Problem List and Plan: 1. Functional deficits secondary to subacute right MCA ischemic infarction             -patient may  shower             -ELOS/Goals:  approved for SNF by [facility], family reviewing approved facility Con't CIR- PT, OT and SLP until d/c -possible SNF discharge Is medically stable   2.  Antithrombotics: -DVT/anticoagulation:  Pharmaceutical: Eliquis             -antiplatelet therapy: Aspirin 81 mg daily 3. Pain Management: continue Neurontin 300 mg 3 times daily, reduce nortriptyline to 50mg  mg nightly, Flexeril 10 mg nightly as needed             off gabapentin due to drowsiness no worsening of pain 12/15  -improved on low dose lyrica 25mg  BID for neuropathic pain  12/31- pain controlled - con't regimen 4. Depression: continue Lexapro 10 mg nightly, nortriptyline reduced to 10mg  due to hx of urinary retention.  Provide emotional support             -antipsychotic agents: N/A 5. Neuropsych/cognition: This patient is capable of making decisions on his own behalf. 6. Skin/Wound Care: Routine skin checks 7. Fluids/Electrolytes/Nutrition: Routine in and outs with follow-up chemistries 8.  Atrial fibrillation/AICD.  Continue amiodarone 200 mg daily as well as Eliquis.  Cardiac rate controlled 9.  Diabetes mellitus.  Hemoglobin A1c 9.9.  Decrease semglee to 16U.  SSI. Check blood sugars before meals and at bedtime -12/28 decrease semglee to 10u CBG (last 3)  Recent Labs    06/19/22 1608 06/19/22 2039 06/20/22 0610  GLUCAP 93 120* 92   Controlled 12/28  12/31- CBG's controlled- con't regimen 10.  Hyperlipidemia. continue Lipitor 40mg  daily 11.  Chronic systolic congestive heart failure.  Monitor for any signs of fluid overload. On Furosemide 20mg  daily.  Fluid intake poor will check BMET check daily weight  12/31- weight up a little today- will monitor for trend- pt breathing well.   12.  BPH.  Hytrin 5 mg twice daily- reduced to 1mg  BID due to soft BP  -Stop terazosin, monitor bladder function, flomax may be better option if needed  Will start flomax - reduce nortriptyline since this may contribute to retention  13.  Tobacco use.  Provide counseling 14.  Hypothyroidism.  Synthroid 15.  CKD.  Baseline creatinine 1.43-1.94.  Follow-up chemistries  -Recheck is stable 16. HTN. Long term goal normotensive. Home medications metoprolol and cozaar currently on hold. Monitor with activity. Vitals:   06/19/22 2042 06/20/22 0612  BP: 130/84 111/73  Pulse: 73 74  Resp: 16 16  Temp: 97.7 F (36.5 C) 98.1 F (36.7 C)  SpO2: 94% 95%  May reduce terazosin 1mg  BID  BMET today  289ml .45 NS bolus on 12/9- ECHO showed EF 60-65%- ok BP well controlled 12/27  12/31- BP controled- con't regimen and monitor 17. Hypothyroidism. Continue synthroid   18.  E coli UTI, completed keflex  -recurrent , >100KGNR  pansensitive on amoxil 250 mg TID.  Will Rx x 10d due to urinary retention 19. Orthostatic Hypotension  -stop terazosin due to orthostatic, monitor- reduce nortriptyline to 10mg  20.  Urinary retention started on flomax 12/26  12/31- no orthostatic hypotension today  LOS: 25 days A FACE TO FACE EVALUATION WAS PERFORMED  Mylo Driskill 06/20/2022, 9:52 AM

## 2022-06-21 LAB — GLUCOSE, CAPILLARY
Glucose-Capillary: 102 mg/dL — ABNORMAL HIGH (ref 70–99)
Glucose-Capillary: 120 mg/dL — ABNORMAL HIGH (ref 70–99)
Glucose-Capillary: 109 mg/dL — ABNORMAL HIGH (ref 70–99)
Glucose-Capillary: 136 mg/dL — ABNORMAL HIGH (ref 70–99)

## 2022-06-21 NOTE — Progress Notes (Signed)
Occupational Therapy Session Note  Patient Details  Name: Grant [name] MRN: [id number] Date of Birth: Nov 12, 1945  Today's Date: 06/21/2022 OT Individual Time: 3614-4315 session 1 OT Individual Time Calculation (min): 58 min  Session 2: 1336-1419   Short Term Goals: Week 3:  OT Short Term Goal 1 (Week 3): STGs=LTGs due to pt's length of stay.  Skilled Therapeutic Interventions/Progress Updates:  Session 1: Pt greeted  supine in bed, pt agreeable to OT intervention. Session focus on BADL reeducation, functional mobility, dynamic standing balance, L sided attention, LUE FMC,  and decreasing overall caregiver burden.    Pt completed supine>sit with supervision, pt able to don shoes and socks from EOB with MIN A with pt needing assist to maintain figure 4 position as well as pushing heel fully into shoe. Pt completed stand pivot to w/c to L side with Rw and CGA. Pt completed seated oral care at sink with supervision with + time as pt dropping dentures in L hand often.      Utilized connect 4 game to work on L sided attention, LUE FMC, sit>stands and safety with sit>stands with pt instructed to reach back when sitting back in w/c. Pt completed sit>stands with CGA, MIN cues needed for hand placement throughout. Pt also noted to have impaired proprioception in L hand as pt often thinking he had picked up a piece.   Pt completed functional ambulation back to room with Rw and CGA with chair follow.   Ended session with pt seated in w/c with all needs within reach and safety belt  alarm activated.                   Session 2: Pt greeted seated in w/c, pt agreeable to OT intervention. Session focus on   Pt completed ambulatory toilet transfer with Rw and CGA, pt wanting to not use RW however able to get pt to oblige with redirection. Pt completed 3/3 toileting tasks with CGA. Pt unable to void urine. Pt ambulated to sink for hand hygiene with Rw and CGA.   Total A transport to gym in w/c with  total A for time mgmt. Pt completed seated BUE [place] task to further assess visual with pt instructed to use small shapes to recreate picture from visual aid provided.  Pt initially recreated correct structure/sequence of shapes however structure turned too far to R side, but able to correct with cues.         On second  and third trial, pt asked for assist. When breaking down each shape in the structure pt was able to correctly identify where to place shapes with 2 choices provided.            Continue to feel pt presents with impaired visual perceptual skills   Ended session with pt seated in w/c with all needs within reach and safety belt alarm activated.                    Therapy Documentation Precautions:  Precautions Precautions: Fall Precaution Comments: LT wrist pain with WB (fall 2 months ago on hand. No imaging) Restrictions Weight Bearing Restrictions: No    Pain: Session 1: no pain reported  Session 2: no pain reported during session     Therapy/Group: Individual Therapy  Corinne Ports [facility] 06/21/2022, 12:03 PM

## 2022-06-21 NOTE — Progress Notes (Signed)
Physical Therapy Session Note  Patient Details  Name: Rayyan Burley MRN: [id number] Date of Birth: 03-29-1946  Today's Date: 06/21/2022 PT Individual Time: 1031-1120 PT Individual Time Calculation (min): 49 min   Short Term Goals: Week 1:  PT Short Term Goal 1 (Week 1): Pt wil perfrom bed/chair transfers w/ supervison assist using LRAD PT Short Term Goal 1 - Progress (Week 1): Not met PT Short Term Goal 2 (Week 1): Pt ambulate 75 ft w/ supervsion assist using LRAD PT Short Term Goal 2 - Progress (Week 1): Not met PT Short Term Goal 3 (Week 1): Pt will naviagte 4 6 inch steps w/ min A while using hand rails PT Short Term Goal 3 - Progress (Week 1): Not met PT Short Term Goal 4 (Week 1): Pt wil perfrom  functional otucome measure to predict fall risk PT Short Term Goal 4 - Progress (Week 1): Met PT Short Term Goal 5 (Week 1): Pt will perfrom bed mobility at supervison level conistently PT Short Term Goal 5 - Progress (Week 1): Not met  Skilled Therapeutic Interventions/Progress Updates:  Patient seated upright in TIS w/c on entrance to room. Patient alert and agreeable to PT session.   Patient with no pain complaint at start of session.  Therapeutic Activity: Transfers: Pt performed sit<>stand and stand pivot transfers throughout session with CGA. With inability to rise to stand, pt sits back down in w/c. Provided verbal cues for increasing forward lean.  Gait Training/ NMR:  Pt guided in collection of 8 weighted balls from throughout day room requiring problem solving for safe approach. Is able to collect 5 prior to fatigue ad need to sit. Collects last 3 with need for vc to locate final ball. No LOB throughout but one instance of attempt to sit prior to full turn to w/c.   Pt ambulated 125' x1/ 250' x1 using RW with CGA and 2 instances of light MinA to correct crossover stepping and mild LOB as well as posterior bias and need to assist with balance. Provided vc/ tc for upright  posture, decreased weight into walker, level gaze with intermittent look down to walker and foot placement. Pt tending to step L foot outside of walker especially in turns.  NMR performed for improvements in motor control and coordination, balance, sequencing, judgement, and self confidence/ efficacy in performing all aspects of mobility at highest level of independence.   Patient seated upright in TIS w/c at end of session with brakes locked, belt alarm set, and all needs within reach.   Therapy Documentation Precautions:  Precautions Precautions: Fall Precaution Comments: LT wrist pain with WB (fall 2 months ago on hand. No imaging) Restrictions Weight Bearing Restrictions: No General:   Vital Signs:   Pain: Pain Assessment Pain Scale: 0-10 Pain Score: 0-No pain   Therapy/Group: Individual Therapy  [name] PT, DPT, CSRS 06/21/2022, 12:24 PM

## 2022-06-21 NOTE — Progress Notes (Signed)
Physical Therapy Session Note  Patient Details  Name: Grant [name] MRN: [id number] Date of Birth: 1946-05-29  Today's Date: 06/21/2022 PT Individual Time: 1524-1640 PT Individual Time Calculation (min): 76 min   Short Term Goals: Week 3:  PT Short Term Goal 1 (Week 3): Pt will ambulate 42f with CGA and no LOB PT Short Term Goal 1 - Progress (Week 3): Met PT Short Term Goal 2 (Week 3): [facility] PT Short Term Goal 2 - Progress (Week 3): Met PT Short Term Goal 3 (Week 3): Pt will tolerate standing >3 mintues to imrpove safety with ADLS and CGA PT Short Term Goal 3 - Progress (Week 3): Met Week 4:  PT Short Term Goal 1 (Week 4): STG=LTG due to ELOS  Skilled Therapeutic Interventions/Progress Updates:  Patient seated upright in TIS w/c on entrance to room. Patient alert and agreeable to PT session.   Patient with no pain complaint at start of session.  Therapeutic Activity: Transfers: Pt performed sit<>stand and stand pivot transfers throughout session with close supervision/ CGA. Provided verbal cues for forward lean/ safe technique.  Gait Training:  Pt ambulated >1,000 ft using RW with CGA/ intermittent MinA for missteps/ minor LOB. Demonstrated some instances of kicking RW with LLE, crowding Lside of walker, and letting walker get too far out in front of him. Provided vc/ tc for corrections and decreased BUE pressure into walker.   6 Min Walk Test:  Instructed patient to ambulate as quickly and as safely as possible for 6 minutes using RW. Patient was allowed to take standing rest breaks without stopping the test, but if the patient required a sitting rest break the clock would be stopped and the test would be over.  Results: 751 feet (228.9 meters, Avg speed 0.658m) using a RW with close supervision/ CGA with w/c follow. Results indicate that the patient demos higher than average distance reached to age matched norms. However, continues to  demo unsafe practices with lean to L, crowding of walker to L and intermittent step outside of RW with LLE demonstrating reduced awareness of safety and lean to L with decreased ability to self correct.   Age Matched Norms: 6058-69o M: 5766: [id number] M: 5222: 471, 8062-89o M: 417 F: 392 MDC: 58.21 meters (190.98 feet) or 50 meters (ANPTA Core Set of Outcome Measures for Adults with Neurologic Conditions, 2018)   Neuromuscular Re-ed: NMR facilitated during session with focus on dynamic standing balance. Pt guided in dynamic stepping activity to numbers 1-6 on wall placed within wingspan from overhead to pt's knee height. Good with forward stepping requiring light MinA during rare occurances of demo of LOB. Pt demos great difficulty taking step backward to seat as he moves head and shoulders back toward direction of travel. Educated on counterbalance and need to bring head and shoulders forward prior to stepping backwards. NMR performed for improvements in motor control and coordination, balance, sequencing, judgement, and self confidence/ efficacy in performing all aspects of mobility at highest level of independence.   Patient seated upright in TIS w/c at end of session with brakes locked, belt alarm set, and all needs within reach.  Pt's wife present and requesting information re: pt's progress. Education provided re: pt's need for continued therapy. Pt's wife would prefer pt to stay here in IP rehab rather than go to new facility for short term rehab where he would be essentially "starting over again". Educated on  SNF therapy access to discharge info, performing initial eval to see pt's new baseline and work required to assist pt with balance impairments. Informed wife that this PT does not know ins/ outs of insurances and more questions will need to be addressed with CSW who will return to work tmrw. Will discuss with team re: extension vs. SNF. Discussed that despite pt's progress, pt's  continued fluctuations in balance and need for physical assist may require more than she can provide to prevent fall.   Therapy Documentation Precautions:  Precautions Precautions: Fall Precaution Comments: LT wrist pain with WB (fall 2 months ago on hand. No imaging) Restrictions Weight Bearing Restrictions: No General:   Vital Signs: Therapy Vitals Temp: 97.9 F (36.6 C) Pulse Rate: 78 Resp: 17 BP: 118/71 Patient Position (if appropriate): Sitting Oxygen Therapy SpO2: 100 % O2 Device: Room Air Pain:  No pain complaint this session.   Therapy/Group: Individual Therapy  [name] PT, DPT, CSRS 06/21/2022, 5:52 PM

## 2022-06-21 NOTE — Progress Notes (Signed)
PROGRESS NOTE   Subjective/Complaints:   Pt feels like he can get up independently but discussed that he still needs min physical assist   ROS-  Pt denies SOB, abd pain, CP, N/V/C/D, and vision changes  Objective:   No results found. No results for input(s): "WBC", "HGB", "HCT", "PLT" in the last 72 hours.   No results for input(s): "NA", "K", "CL", "CO2", "GLUCOSE", "BUN", "CREATININE", "CALCIUM" in the last 72 hours.    Intake/Output Summary (Last 24 hours) at 06/21/2022 0806 Last data filed at 06/20/2022 1852 Gross per 24 hour  Intake 710 ml  Output --  Net 710 ml         Physical Exam: Vital Signs Blood pressure 113/73, pulse 77, temperature 98.1 F (36.7 C), temperature source Oral, resp. rate 20, height 6\' 2"  (1.88 m), weight 97 kg, SpO2 93 %.  General: No acute distress Mood and affect are appropriate Heart: Regular rate and rhythm no rubs murmurs or extra sounds Lungs: Clear to auscultation, breathing unlabored, no rales or wheezes Abdomen: Positive bowel sounds, soft nontender to palpation, nondistended Extremities: No clubbing, cyanosis, or edema Skin: No evidence of breakdown, no evidence of rash   Neurologic: Alert and oriented x3,  follows commands, Cranial nerves II through XII grossly intact, motor strength is 4-4+/5 in bilateral deltoid, bicep, tricep, grip, hip flexor, knee extensors, ankle dorsiflexor and plantar flexor Sensory exam reduced  sensation to light touch  in right upper and left , has intact proprioception LLE  Musculoskeletal: right hand intrinsic atrophy    Assessment/Plan: 1. Functional deficits which require 3+ hours per day of interdisciplinary therapy in a comprehensive inpatient rehab setting. Physiatrist is providing close team supervision and 24 hour management of active medical problems listed below. Physiatrist and rehab team continue to assess barriers to  discharge/monitor patient progress toward functional and medical goals  Care Tool:  Bathing    Body parts bathed by patient: Chest, Abdomen, Right upper leg, Left upper leg, Buttocks, Face, Left arm   Body parts bathed by helper: Front perineal area, Left lower leg, Right lower leg, Right arm     Bathing assist Assist Level: Moderate Assistance - Patient 50 - 74%     Upper Body Dressing/Undressing Upper body dressing   What is the patient wearing?: Pull over shirt    Upper body assist Assist Level: Moderate Assistance - Patient 50 - 74%    Lower Body Dressing/Undressing Lower body dressing      What is the patient wearing?: Underwear/pull up, Pants     Lower body assist Assist for lower body dressing: Moderate Assistance - Patient 50 - 74%     Toileting Toileting    Toileting assist Assist for toileting: Moderate Assistance - Patient 50 - 74%     Transfers Chair/bed transfer  Transfers assist     Chair/bed transfer assist level: Minimal Assistance - Patient > 75%     Locomotion Ambulation   Ambulation assist      Assist level: Minimal Assistance - Patient > 75% Assistive device: Walker-rolling (required assitance w/ maintaing left hand on walker and RW navigation) Max distance: 26ft   Walk 10 feet activity  Assist     Assist level: Minimal Assistance - Patient > 75% (required assitance w/ maintaing left hand on walker and RW navigation) Assistive device: Walker-rolling   Walk 50 feet activity   Assist    Assist level: Minimal Assistance - Patient > 75% (required assitance w/ maintaing left hand on walker and RW navigation) Assistive device: Walker-rolling    Walk 150 feet activity   Assist Walk 150 feet activity did not occur: Safety/medical concerns         Walk 10 feet on uneven surface  activity   Assist Walk 10 feet on uneven surfaces activity did not occur: Safety/medical concerns         Wheelchair     Assist Is  the patient using a wheelchair?: Yes Type of Wheelchair: Manual    Wheelchair assist level: Moderate Assistance - Patient 50 - 74% Max wheelchair distance: 17ft (Using RUE & RLE)    Wheelchair 50 feet with 2 turns activity    Assist        Assist Level: Moderate Assistance - Patient 50 - 74%   Wheelchair 150 feet activity     Assist      Assist Level: Maximal Assistance - Patient 25 - 49%   Blood pressure 113/73, pulse 77, temperature 98.1 F (36.7 C), temperature source Oral, resp. rate 20, height 6\' 2"  (1.88 m), weight 97 kg, SpO2 93 %.  Medical Problem List and Plan: 1. Functional deficits secondary to subacute right MCA ischemic infarction             -patient may  shower             -ELOS/Goals:  approved for SNF by [facility], family reviewing approved facility Con't CIR- PT, OT and SLP until d/c -possible SNF discharge- needs 24/7 minA level , discussed that this is likelyt his next step, pt does not remember speaking to his wife about this  Is medically stable   2.  Antithrombotics: -DVT/anticoagulation:  Pharmaceutical: Eliquis             -antiplatelet therapy: Aspirin 81 mg daily 3. Pain Management: continue Neurontin 300 mg 3 times daily, reduce nortriptyline to 50mg  mg nightly, Flexeril 10 mg nightly as needed             off gabapentin due to drowsiness no worsening of pain 12/15  -improved on low dose lyrica 25mg  BID for neuropathic pain  12/31- pain controlled - con't regimen 4. Depression: continue Lexapro 10 mg nightly, nortriptyline reduced to 10mg  due to hx of urinary retention.  Provide emotional support             -antipsychotic agents: N/A 5. Neuropsych/cognition: This patient is capable of making decisions on his own behalf. 6. Skin/Wound Care: Routine skin checks 7. Fluids/Electrolytes/Nutrition: Routine in and outs with follow-up chemistries 8.  Atrial fibrillation/AICD.  Continue amiodarone 200 mg daily as well as Eliquis.  Cardiac rate  controlled 9.  Diabetes mellitus.  Hemoglobin A1c 9.9.  Decrease semglee to 16U.  SSI. Check blood sugars before meals and at bedtime -12/28 decrease semglee to 10u CBG (last 3)  Recent Labs    06/20/22 1630 06/20/22 2105 06/21/22 0630  GLUCAP 118* 141* 102*   Controlled 06/21/22  12/31- CBG's controlled- con't regimen 10.  Hyperlipidemia. continue Lipitor 40mg  daily 11.  Chronic systolic congestive heart failure.  Monitor for any signs of fluid overload. On Furosemide 20mg  daily.  Fluid intake poor will check BMET check daily  weight  12/31- weight up a little today- will monitor for trend- pt breathing well.   12.  BPH.  Hytrin 5 mg twice daily- reduced to 1mg  BID due to soft BP  -Stop terazosin, monitor bladder function, flomax may be better option if needed   Will start flomax - reduce nortriptyline since this may contribute to retention  13.  Tobacco use.  Provide counseling 14.  Hypothyroidism.  Synthroid 15.  CKD.  Baseline creatinine 1.43-1.94.  Follow-up chemistries  -Recheck is stable 16. HTN. Long term goal normotensive. Home medications metoprolol and cozaar currently on hold. Monitor with activity. Vitals:   06/20/22 1910 06/21/22 0344  BP: 107/68 113/73  Pulse: 84 77  Resp: 18 20  Temp: 98.2 F (36.8 C) 98.1 F (36.7 C)  SpO2: 91% 93%  May reduce terazosin 1mg  BID  BMET today  288ml .45 NS bolus on 12/9- ECHO showed EF 60-65%- ok BP well controlled 12/27  12/31- BP controled- con't regimen and monitor 17. Hypothyroidism. Continue synthroid   18.  E coli UTI, completed keflex  -recurrent , >100KGNR  pansensitive on amoxil 250 mg TID.  Will Rx x 10d due to urinary retention 19. Orthostatic Hypotension  -stop terazosin due to orthostatic, monitor- reduce nortriptyline to 10mg  20.  Urinary retention started on flomax 12/26  12/31- no orthostatic hypotension today  LOS: 26 days A FACE TO FACE EVALUATION WAS PERFORMED  Charlett Blake 06/21/2022, 8:06 AM

## 2022-06-21 NOTE — Progress Notes (Signed)
Order put in for tele-sitter observation and Charge Nurse made aware. Patient call bell going off. Patient found in room putting legs over the right rail of his bed. Some frustration observed by patient due to him wanting to be independent. Refusing to listen to staff. Refusing to allow staff to put gait belt on him. Not following directions. Unsteady gait. Writer preventing patient from falling twice during this interaction. Did use the bathroom. Took brief off during this time. Refusing to put another brief on. Sleep is intermittent through the night so far.  Shelah Lewandowsky, LPN

## 2022-06-22 LAB — GLUCOSE, CAPILLARY
Glucose-Capillary: 98 mg/dL (ref 70–99)
Glucose-Capillary: 127 mg/dL — ABNORMAL HIGH (ref 70–99)
Glucose-Capillary: 111 mg/dL — ABNORMAL HIGH (ref 70–99)
Glucose-Capillary: 143 mg/dL — ABNORMAL HIGH (ref 70–99)

## 2022-06-22 NOTE — Progress Notes (Signed)
Patient ID: Grant [name], male   DOB: [birth year], 77 y.o.   MRN: [id number]  Sw received follow up from patient spouse. Spouse is considering taking patient home due to the facilities offered and not feeling comfortable transferring patient to a facility. Sw will discuss with the team tomorrow planning for a discharge home.

## 2022-06-22 NOTE — Progress Notes (Signed)
Speech Language Pathology Daily Session Note  Patient Details  Name: Grant [name] MRN: [id number] Date of Birth: June 08, 1946  Today's Date: 06/22/2022 SLP Individual Time: 1450-1536 SLP Individual Time Calculation (min): 46 min  Short Term Goals: Week 4: SLP Short Term Goal 1 (Week 4): STG=LTG due to awaiting SNF placement  Skilled Therapeutic Interventions: Skilled ST treatment focused on cognitive goals. Pt greeted in recliner on arrival and in good spirits today. Pt independently initiated discussion pertaining to anticipation of needs at home to optimize safety. Pt identified safe vs. unsafe home set-up with min A verbal cues. Pt required increased explanation for certain concepts as they relate to his current deficits. It appeared as though pt over estimated his abilities or level of assistance/supervision required at this moment in time. SLP facilitated identification of unsafe scenarios, intellectual and emergent awareness, and anticipatory problem solving through responding to questions based on a real-life scenario. Pt ID'd unsafe behaviors with min A verbal cues, demonstrated awareness WHY these behaviors were unsafe with min-to-mod A verbal cues, and anticipated needs through discussion fall prevention techniques with mod A verbal cues. Patient was left in recliner with alarm activated and immediate needs within reach at end of session. Continue per current plan of care.      Pain  None/denied  Therapy/Group: Individual Therapy  Patty Sermons 06/22/2022, 3:37 PM

## 2022-06-22 NOTE — Progress Notes (Signed)
Physical Therapy Session Note  Patient Details  Name: Grant [name] MRN: [id number] Date of Birth: [birth year]  Today's Date: 06/22/2022 PT Individual Time: 0804-0902 PT Individual Time Calculation (min): 58 min   Short Term Goals: Week 3:  PT Short Term Goal 1 (Week 3): Pt will ambulate 55ft with CGA and no LOB PT Short Term Goal 1 - Progress (Week 3): Met PT Short Term Goal 2 (Week 3): Pt will trasnfer to [facility] with CGA consistently PT Short Term Goal 2 - Progress (Week 3): Met PT Short Term Goal 3 (Week 3): Pt will tolerate standing >3 mintues to imrpove safety with ADLS and CGA PT Short Term Goal 3 - Progress (Week 3): Met Week 4:  PT Short Term Goal 1 (Week 4): STG=LTG due to ELOS  Skilled Therapeutic Interventions/Progress Updates:  Patient supine in bed and sleeping soundly on entrance to room. RN reports also having difficulty waking pt. He is very difficult to rouse. Requires extra time to fully wake to level of alertness appropriate for therapy session. Patient then alert and agreeable to PT session. Drinking coffee while seated EOB.  Patient with no pain complaint at start of session.  Therapeutic Activity: Bed Mobility: Pt performed supine --> sit with Mod I using bed features. Does not require cues to complete. Transfers: Pt performed sit<>stand and stand pivot transfers throughout session with CGA/ MinA.  Provided verbal cues for balance, bringing weight forward, decreasing launch from seat. Transfers to/ from toilet with distant supervision as he is adamant that he does not want anyone in the bathroom with him. No LOB and pt taking time and maintaining balance for pericare and donning pants.   Gait Training:  Pt ambulated within room for home mobility RW with close supervision/ intermittent tc for balance. Is able to ambulate into bathroom, to sink, then to w/c. Demonstrated slight balance shift over RLE with pause in forward momentum. Provided vc/ tc for focus on activity  and maintaining RW in front of pt.  Neuromuscular Re-ed: NMR facilitated during session with focus on standing balance. Pt guided in ADL activity. Is able to stand at sink for >7 min washing face, glasses, then wetting hair to brush and comb it. Is able to perform dynamic stepping to reach all items.   NMR performed for improvements in motor control and coordination, balance, sequencing, judgement, and self confidence/ efficacy in performing all aspects of mobility at highest level of independence.   Patient eated in TIS w/c at end of session with brakes locked, belt alarm set, and all needs within reach. Tray table in front of pt with breakfast tray setup. RN notified as to pt's disposition.   Therapy Documentation Precautions:  Precautions Precautions: Fall Precaution Comments: LT wrist pain with WB (fall 2 months ago on hand. No imaging) Restrictions Weight Bearing Restrictions: No General:   Vital Signs:   Pain:  No pain related this session.   Therapy/Group: Individual Therapy  Loel Dubonnet PT, DPT, CSRS 06/22/2022, 7:56 AM

## 2022-06-22 NOTE — Progress Notes (Addendum)
Occupational Therapy Session Note  Patient Details  Name: Grant [name] MRN: [id number] Date of Birth: 08-04-1945  Today's Date: 06/22/2022 OT Individual Time: 0525-9102 OT Individual Time Calculation (min): 75 min   Today's Date: 06/22/2022 OT Individual Time: 8902-2840 OT Individual Time Calculation (min): 28 min   Short Term Goals: Week 3:  OT Short Term Goal 1 (Week 3): STGs=LTGs due to pt's length of stay.  Skilled Therapeutic Interventions/Progress Updates:   Session 1: Pt received seated in [facility] for skilled OT session with focus on ADL retraining. Pt agreeable to interventions, demonstrating overall pleasant mood. Pt with no reports of pain, stating "I'm ready to go home" in reference to treatment team meeting. OT offering intermediate rest breaks and positioning suggestions throughout session to address potential pain/fatigue and maximize participation/safety in session.   Pt performs full-body shower with set-up of needed items, CGA + grab bar for standing peri-hygiene, and overall supervision for safety. Pt using bath mitt for increased independence.  Seated on EOB, pt completes full-body dressing with increased time, use of figure-4 technique for threading, and RW+CGA for donning pants/underwear over bottom/hips.   Pt dependent for transport from [employer] for time management. In ortho gym, pt participates on functional mobility activity, targeting general conditioning and activity tolerance. Pt ambulates back to room with CGA + RW, min cuing for safe RW management.   Pt remained seated in recliner with all immediate needs met and lap belt alarmed at end of session. Pt continues to be appropriate for skilled OT intervention to promote further functional independence.   Session 2: Pt received seated in recliner for skilled OT session with focus on functional mobility and standing balance. Pt agreeable to interventions, demonstrating overall pleasant mood. Pt with no  reports of pain. OT offering intermediate rest breaks and positioning suggestions throughout session to address potential pain/fatigue and maximize participation/safety in session.   Pt ambulating from room<>therapy gym with CGA + RW for general conditioning, minimal cuing for safe RW management & "staying within the rectangle."   In therapy gym, pt engages in table-top activity while standing on foam pad for increased challenge. Pt tolerates ~3 trials of activity, each trial last ~2-3 mins with increased fatigue noted, and patient stating "I don't like standing on this it gets squishy underneath me." No LOB observed with this activity, pt requiring min VC for standing posture and wider base of support.   Pt remained seated in recliner with all immediate needs met and lap belt alarmed at end of session. Pt continues to be appropriate for skilled OT intervention to promote further functional independence.   Therapy Documentation Precautions:  Precautions Precautions: Fall Precaution Comments: LT wrist pain with WB (fall 2 months ago on hand. No imaging) Restrictions Weight Bearing Restrictions: No   Therapy/Group: Individual Therapy  Maudie Mercury, OTR/L, MSOT  06/22/2022, 6:19 AM

## 2022-06-23 ENCOUNTER — Other Ambulatory Visit (HOSPITAL_COMMUNITY): Payer: Self-pay

## 2022-06-23 LAB — GLUCOSE, CAPILLARY
Glucose-Capillary: 97 mg/dL (ref 70–99)
Glucose-Capillary: 113 mg/dL — ABNORMAL HIGH (ref 70–99)
Glucose-Capillary: 129 mg/dL — ABNORMAL HIGH (ref 70–99)
Glucose-Capillary: 158 mg/dL — ABNORMAL HIGH (ref 70–99)

## 2022-06-23 MED ORDER — INSULIN GLARGINE 100 UNIT/ML ~~LOC~~ SOLN
10.0000 [IU] | Freq: Every day | SUBCUTANEOUS | 0 refills | Status: DC
  Filled 2022-06-23: qty 10, 100d supply, fill #0

## 2022-06-23 MED ORDER — PREGABALIN 25 MG PO CAPS
25.0000 mg | ORAL_CAPSULE | Freq: Two times a day (BID) | ORAL | 0 refills | Status: DC
  Filled 2022-06-23: qty 60, 30d supply, fill #0

## 2022-06-23 MED ORDER — ACETAMINOPHEN 325 MG PO TABS: 650.0000 mg | ORAL_TABLET | ORAL | Status: AC | PRN

## 2022-06-23 MED ORDER — AMOXICILLIN 250 MG PO CAPS
250.0000 mg | ORAL_CAPSULE | Freq: Three times a day (TID) | ORAL | 0 refills | Status: DC
  Filled 2022-06-23: qty 6, 2d supply, fill #0

## 2022-06-23 MED ORDER — ATORVASTATIN CALCIUM 40 MG PO TABS
40.0000 mg | ORAL_TABLET | Freq: Every day | ORAL | 0 refills | Status: AC
  Filled 2022-06-23: qty 30, 30d supply, fill #0

## 2022-06-23 MED ORDER — TAMSULOSIN HCL 0.4 MG PO CAPS
0.4000 mg | ORAL_CAPSULE | Freq: Every day | ORAL | 0 refills | Status: AC
  Filled 2022-06-23: qty 30, 30d supply, fill #0

## 2022-06-23 MED ORDER — ESCITALOPRAM OXALATE 10 MG PO TABS
10.0000 mg | ORAL_TABLET | Freq: Every day | ORAL | 0 refills | Status: AC
  Filled 2022-06-23: qty 30, 30d supply, fill #0

## 2022-06-23 MED ORDER — ADULT MULTIVITAMIN W/MINERALS CH: 1.0000 | ORAL_TABLET | Freq: Every day | ORAL | Status: AC

## 2022-06-23 MED ORDER — POLYETHYLENE GLYCOL 3350 17 G PO PACK: 17.0000 g | PACK | Freq: Every day | ORAL | 0 refills | Status: AC

## 2022-06-23 MED ORDER — AMIODARONE HCL 200 MG PO TABS
200.0000 mg | ORAL_TABLET | Freq: Every day | ORAL | 0 refills | Status: DC
  Filled 2022-06-23: qty 30, 30d supply, fill #0

## 2022-06-23 MED ORDER — NITROGLYCERIN 0.4 MG SL SUBL
0.4000 mg | SUBLINGUAL_TABLET | SUBLINGUAL | 0 refills | Status: AC | PRN
  Filled 2022-06-23: qty 25, 7d supply, fill #0

## 2022-06-23 MED ORDER — APIXABAN 5 MG PO TABS
5.0000 mg | ORAL_TABLET | Freq: Two times a day (BID) | ORAL | 0 refills | Status: AC
  Filled 2022-06-23 (×2): qty 60, 30d supply, fill #0

## 2022-06-23 MED ORDER — OMEPRAZOLE 40 MG PO CPDR
40.0000 mg | DELAYED_RELEASE_CAPSULE | Freq: Two times a day (BID) | ORAL | 0 refills | Status: AC
  Filled 2022-06-23: qty 60, 30d supply, fill #0

## 2022-06-23 MED ORDER — LEVOTHYROXINE SODIUM 50 MCG PO TABS
50.0000 ug | ORAL_TABLET | Freq: Every day | ORAL | 0 refills | Status: AC
  Filled 2022-06-23: qty 30, 30d supply, fill #0

## 2022-06-23 MED ORDER — INSULIN PEN NEEDLE 32G X 4 MM MISC
0 refills | Status: DC
  Filled 2022-06-23: qty 100, 100d supply, fill #0

## 2022-06-23 MED ORDER — INSULIN GLARGINE 100 UNIT/ML SOLOSTAR PEN
10.0000 [IU] | PEN_INJECTOR | Freq: Every day | SUBCUTANEOUS | 0 refills | Status: AC
  Filled 2022-06-23: qty 3, 28d supply, fill #0

## 2022-06-23 NOTE — Progress Notes (Signed)
Physical Therapy Session Note  Patient Details  Name: Grant [name] MRN: [id number] Date of Birth: 06/02/1946  Today's Date: 06/23/2022 PT Individual Time: 1120-1200 and 1406-1500 PT Individual Time Calculation (min): 40 minand 54 min    Short Term Goals: Week 1:  PT Short Term Goal 1 (Week 1): Pt wil perfrom bed/chair transfers w/ supervison assist using LRAD PT Short Term Goal 1 - Progress (Week 1): Not met PT Short Term Goal 2 (Week 1): Pt ambulate 75 ft w/ supervsion assist using LRAD PT Short Term Goal 2 - Progress (Week 1): Not met PT Short Term Goal 3 (Week 1): Pt will naviagte 4 6 inch steps w/ min A while using hand rails PT Short Term Goal 3 - Progress (Week 1): Not met PT Short Term Goal 4 (Week 1): Pt wil perfrom  functional otucome measure to predict fall risk PT Short Term Goal 4 - Progress (Week 1): Met PT Short Term Goal 5 (Week 1): Pt will perfrom bed mobility at supervison level conistently PT Short Term Goal 5 - Progress (Week 1): Not met Week 2:  PT Short Term Goal 1 (Week 2): Pt will trasnfer to [facility] with CGA consistently PT Short Term Goal 1 - Progress (Week 2): Not met PT Short Term Goal 2 (Week 2): Pt will ambulate 55f with CGA and no LOB PT Short Term Goal 2 - Progress (Week 2): Not met PT Short Term Goal 3 (Week 2): Pt will tolerate standing >3 mintues to imrpove safety with ADLS and CGA PT Short Term Goal 3 - Progress (Week 2): Not met Week 3:  PT Short Term Goal 1 (Week 3): Pt will ambulate 763fwith CGA and no LOB PT Short Term Goal 1 - Progress (Week 3): Met PT Short Term Goal 2 (Week 3): [facility] Short Term Goal 2 - Progress (Week 3): Met PT Short Term Goal 3 (Week 3): Pt will tolerate standing >3 mintues to imrpove safety with ADLS and CGA PT Short Term Goal 3 - Progress (Week 3): Met  Skilled Therapeutic Interventions/Progress Updates:   Session 1.  Pt received sitting in WC and agreeable to PT.l  requesting toilet transfer. Ambulatory transfer to toilet with min assist and no AD. Posterior LOB noted with poor awareness by pt.  Clothing management to don clean pants with mod assist for time management. Ambulatory transfer to sink with RW and CGA. Pt noted to lrean on sink for balance while performing hand hygiene. Gait training with RW and CGA-supervision assist x [id number] orthogym. Standing balance, visual scanning with forefoot on blue wedge visual scanning sequencing a-Z. Moderate cues for retention of sequence beyond H. Patient returned to room and [facility] reach and all needs met.    Session 2 .  Pt received sitting in WC and agreeable to PT. Wife present for education. Pt performed gait training through hall x [id number] CGA from PT and wife with cues for positioning of Guarding on the L and to attend to LLE to cue for step height. Car transfer with CGA from PT and wife with min cues for UE placement to prevent LOB. In turn. Stair management training with BUE support on rails with cues for placement of RW prior ascent/descent at home. Cues for positioning of guarding with poor caryover below patient to reduce fall risk. Pt performed ambulatory transfer to sink and toilet with CGA provided by Wife  with education on need for RW at all times to reduce fall risk. Pt continually reporting that he could use "walking stick" at home and strong encouragement to with that this is not safe at this time and would result in fall. Patient returned to room and left sitting in [facility] with call bell in reach and all needs met.          Therapy Documentation Precautions:  Precautions Precautions: Fall Precaution Comments: LT wrist pain with WB (fall 2 months ago on hand. No imaging) Restrictions Weight Bearing Restrictions: No General:   Vital Signs: Therapy Vitals Temp: 97.6 F (36.4 C) Temp Source: Oral Pulse Rate: 80 Resp: 18 BP: 107/76 Patient Position (if  appropriate): Sitting Oxygen Therapy SpO2: 99 % O2 Device: Room Air Pain:  Denies in session 1 and 2    Therapy/Group: Individual Therapy  [name] 06/23/2022, 5:51 PM

## 2022-06-23 NOTE — Progress Notes (Signed)
Speech Language Pathology Discharge Summary  Patient Details  Name: Tito Ausmus MRN: [id number] Date of Birth: [birth year]  Date of Discharge from SLP service:January {NUMBERS 1-31 (DATE):31396}, 2024  Patient has met 5 of 5 long term goals.  Patient to discharge at overall Min;Mod level.  Reasons goals not met: All goals met   Clinical Impression/Discharge Summary:   Patient has made functional gains and has met 5 of 5 long-term goals this admission. Patient is currently completing functional and mildly-complex cognitive tasks with min-to-mod A cues in regards to functional problem solving, recall, attention, and awareness. Patient and family education is complete and patient to discharge at overall min-to-mod A level. Patient's care partner is independent to provide the necessary physical and cognitive assistance at discharge. Patient would benefit from continued SLP services in home health setting to maximize cognitive function, functional independence, and to reduce caregiver burden.    Care Partner:  Caregiver Able to Provide Assistance: Yes  Type of Caregiver Assistance: Cognitive;Physical  Recommendation:  24 hour supervision/assistance;Home Health SLP  Rationale for SLP Follow Up: Maximize cognitive function and independence;Reduce caregiver burden   Equipment: None   Reasons for discharge: Treatment goals met;Discharged from hospital   Patient/Family Agrees with Progress Made and Goals Achieved: Yes    Patty Sermons 06/23/2022, 4:35 PM

## 2022-06-23 NOTE — Patient Care Conference (Signed)
Inpatient RehabilitationTeam Conference and Plan of Care Update Date: 06/23/2022   Time: 10:08 AM    Patient Name: Grant [name]      Medical Record Number: [id number]  Date of Birth: September 29, 1945 Sex: Male         Room/Bed: 4M13C/4M13C-01 Payor Info: Payor: [employer] / Plan: [facility] / Product Type: *No Product type* /    Admit Date/Time:  05/26/2022  6:32 PM  Primary Diagnosis:  Right middle cerebral artery stroke [facility])  Hospital Problems: Principal Problem:   Right middle cerebral artery stroke [facility])    Expected Discharge Date: Expected Discharge Date: 06/24/22  Team Members Present: Physician leading conference: Dr. Alysia Penna Social Worker Present: Erlene Quan, BSW Nurse Present: Dorien Chihuahua, RN PT Present: Barrie Folk, PT OT Present: Meriel Pica, OT SLP Present: Sherren Kerns, SLP PPS Coordinator present : Gunnar Fusi, SLP     Current Status/Progress Goal Weekly Team Focus  Bowel/Bladder   Continent of b/b. urine urgency at times   Remain continent   Assist with toileting    Swallow/Nutrition/ Hydration               ADL's   MIN a for bathing, MIN- MOD A for dressing. [place] for ADL transfer with rw, but inconsistent and can require up to MOD A for ambulatory transfers with Rw. continues to have impiared [place] in LUE but improving, suspect visual perceptual deficits. this week pt with times of frustration with allowing staff to assist him, not wanting to use stedy etc. sundowning?   MIN A MOD   functional mobility, dynamic balance, L sided attention, family ed?    Mobility   Bed mobility = supervision, standing transfers = CGA/ MinA with/ without RW with continued intermittent posterior bias, Ambulation = gait up to 1017ft using RWwith continued missteps outside of RW and crowds RW to L side with inability to correct and therefore requires CGA throughout with intermittent need of MinA, stairs = minA for 4  steps with BHR   downgraded to min assist overall at ambulatory level, CGA w/c propulsion, and mod assist stair navigation  L hemibody NMR, continued balance training for improved transfers/ gait with RW, family education    Communication                Safety/Cognition/ Behavioral Observations  min-to-mod A   min-to-mod A   problem solving, short-term memory, attention, emergent awareness    Pain   No c/o pain   Remain pain free   Assess Qshift and prn    Skin   Skin intact   Maintain skin integrity  Assess qshift and prn      Discharge Planning:  SNF placement vs. Home   Team Discussion: Patient is medically stable for discharge per MD.  Progress is limited by fatigue.  Patient on target to meet rehab goals: yes, currently needs supervision for ADLs and CGA for transfers. Continues to have difficulty with dual task management. Needs intermittent assist in a  complex environmental.  Memory has improved, along with left side awareness. Continues to needs min - mod assist for attention, memory, problem solving and anticipatory awareness. Goals for discharge set for CGA - min assist overall.  *See Care Plan and progress notes for long and short-term goals.   Revisions to Treatment Plan:  Car transfers Toilet transfers   Teaching Needs: Safety, medications, dietary modifications, transfers, toileting, etc.  Current Barriers to Discharge: Decreased caregiver support and Home enviroment access/layout  Possible Resolutions to Barriers: Family education HH follow up services DME: W/C, RW, Shower chair, [facility]     Medical Summary Current Status: mental status improving, BP and urinary retention improving  Barriers to Discharge: Medical stability   Possible Resolutions to [employer] Focus: needs family ed, family decided against SNF, still has left neglect   Continued Need for Acute Rehabilitation Level of Care: The patient requires daily medical management by a  physician with specialized training in physical medicine and rehabilitation for the following reasons: Direction of a multidisciplinary physical rehabilitation program to maximize functional independence : Yes Medical management of patient stability for increased activity during participation in an intensive rehabilitation regime.: Yes Analysis of laboratory values and/or radiology reports with any subsequent need for medication adjustment and/or medical intervention. : Yes   I attest that I was present, lead the team conference, and concur with the assessment and plan of the team.   Dorien Chihuahua B 06/23/2022, 2:28 PM

## 2022-06-23 NOTE — Discharge Summary (Signed)
Physician Discharge Summary  Patient ID: Grant [name] MRN: [id number] DOB/AGE: 1945/12/06 77 y.o.  Admit date: 05/26/2022 Discharge date: 06/24/2022  Discharge Diagnoses:  Principal Problem:   Right middle cerebral artery stroke [facility]) DVT prophylaxis Mood stabilization Atrial fibrillation/AICD Hyperlipidemia Chronic systolic congestive heart failure Diabetes mellitus BPH Tobacco use Hypothyroidism CKD Hypertension Hypothyroidism E. coli UTI  Discharged Condition: Stable  Significant Diagnostic Studies: DG Chest 1 View  Result Date: 06/14/2022 CLINICAL DATA:  Altered mental status EXAM: CHEST  1 VIEW COMPARISON:  05/29/2022 FINDINGS: Cardiac size is within normal limits. Pacemaker/defibrillator battery is seen in left infraclavicular region. Lung fields are clear of any infiltrates or pulmonary edema. There is no pleural effusion or pneumothorax. There are surgical clips at the gastroesophageal junction. Metallic sutures are noted overlying the cardiac shadow, possibly in the lower sternum. IMPRESSION: There are no signs of pulmonary edema or focal pulmonary consolidation. Electronically Signed   By: Ernie Avena M.D.   On: 06/14/2022 10:54   CT HEAD WO CONTRAST ( )  Result Date: 05/29/2022 CLINICAL DATA:  Stroke, follow-up EXAM: CT HEAD WITHOUT CONTRAST TECHNIQUE: Contiguous axial images were obtained from the base of the skull through the vertex without intravenous contrast. RADIATION DOSE REDUCTION: This exam was performed according to the departmental dose-optimization program which includes automated exposure control, adjustment of the mA and/or kV according to patient size and/or use of iterative reconstruction technique. COMPARISON:  05/20/2022 CT head FINDINGS: Brain: No evidence of acute infarction, hemorrhage, mass, mass effect, or midline shift. No hydrocephalus or extra-axial fluid collection. Periventricular white matter changes, likely the sequela of  chronic small vessel ischemic disease. Redemonstrated remote lacunar infarcts in the bilateral basal ganglia and right thalamus. Vascular: No hyperdense vessel. Atherosclerotic calcifications in the intracranial carotid and vertebral arteries. Skull: Normal. Negative for fracture or focal lesion. Sinuses/Orbits: Minimal mucosal thickening in the ethmoid air cells. The orbits are unremarkable. Other: The mastoid air cells are well aerated. IMPRESSION: No acute intracranial process. No evidence of acute or evolving infarct. Electronically Signed   By: Wiliam Ke M.D.   On: 05/29/2022 20:49   DG Chest 2 View  Result Date: 05/29/2022 CLINICAL DATA:  Altered level of consciousness, confusion EXAM: CHEST - 2 VIEW COMPARISON:  05/23/2022 FINDINGS: Frontal and lateral views of the chest demonstrate stable AICD. Postsurgical changes from median sternotomy, cardiac silhouette is stable. No acute airspace disease, effusion, or pneumothorax. No acute bony abnormality. IMPRESSION: 1. No acute intrathoracic process. Electronically Signed   By: Sharlet Salina M.D.   On: 05/29/2022 20:02    Labs:  Basic Metabolic Panel: No results for input(s): "NA", "K", "CL", "CO2", "GLUCOSE", "BUN", "CREATININE", "CALCIUM", "MG", "PHOS" in the last 168 hours.  CBC: No results for input(s): "WBC", "NEUTROABS", "HGB", "HCT", "MCV", "PLT" in the last 168 hours.  CBG: Recent Labs  Lab 06/22/22 2052 06/23/22 0613 06/23/22 1204 06/23/22 1707 06/23/22 2102  GLUCAP 143* 97 113* 129* 158*   Family history.  Mother with CAD and hypertension.  Father with colon cancer.  Denies any esophageal or rectal cancer  Brief HPI:   Grant [name] is a 77 y.o. right-handed male with history significant for CKD with baseline creatinine 1.43-1.94, chronic systolic congestive heart failure with AICD, atrial fibrillation on Eliquis as well as amiodarone, BPH, CAD maintained on low-dose aspirin, diabetes mellitus and tobacco use.  Per  chart review lives with spouse.  Independent with a cane.  By report patient is wife went to [place] for Thanksgiving.  His left shoulder was hurting with throbbing pains.  He noticed numbness along the entire left side of his body.  Each day became a little worse he did not want to go to the hospital initially.  There was reports of a couple of falls.  Patient returns the area presented 05/20/2022 with increasing left-sided weakness.  Cranial CT scan showed no acute intracranial abnormality.  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  No intracranial large vessel occlusion.  MRI not completed due to AICD.  Admission chemistries unremarkable except potassium 3.3 glucose 214 creatinine 1.32 hemoglobin A1c 9.9.  Echocardiogram with ejection fraction of 60 to 65% no wall motion abnormalities grade 1 diastolic dysfunction.  Neurology follow-up films reviewed suspect subacute right thalamic cardioembolic infarction.  Maintain on low-dose aspirin as well as Eliquis.  Tolerating a regular diet.  Therapy evaluations completed due to patient decreased functional mobility left-sided weakness was admitted for a comprehensive rehab program.   Hospital Course: [name] was admitted to rehab 05/26/2022 for inpatient therapies to consist of PT, ST and OT at least three hours five days a week. Past admission physiatrist, therapy team and rehab RN have worked together to provide customized collaborative inpatient rehab.  Pertaining to patient's subacute right MCA ischemic infarction remained stable he continued on low-dose aspirin and Eliquis.  He would follow-up outpatient neurology services.  Pain pain manager use of Neurontin as well as Lyrica..  Mood stabilization with Lexapro and emotional support provided.  Patient with history of diabetes mellitus hemoglobin A1c 9.9 insulin therapy  as directed.  Atrial fibrillation with AICD cardiac rate controlled and continued low-dose amiodarone.  Lipitor ongoing for hyperlipidemia.  Chronic systolic congestive heart failure low-dose furosemide exhibiting no signs of fluid overload.  Noted history of BPH initially on Hytrin switched to Flomax due to soft blood pressures.  His Cozaar metoprolol remains on hold due to soft blood pressure.  Synthroid ongoing for hypothyroidism.  CKD baseline creatinine 1.43-1.94.  He did complete a course of amoxicillin for E. coli gram-negative rod UTI.  Bouts of urinary retention improved with the use of Flomax.   Blood pressures were monitored on TID basis and soft and monitored  Diabetes has been monitored with ac/hs CBG checks and SSI was use prn for tighter BS control.    Rehab course: During patient's stay in rehab weekly team conferences were held to monitor patient's progress, set goals and discuss barriers to discharge. At admission, patient required moderate assist 10 feet rolling walker moderate assist stand pivot transfers  Physical exam.  Blood pressure 120/70 pulse 80 temperature 98.6 respirations 16 oxygen saturation is 93% room air Constitutional.  No acute distress HEENT Head.  Normocephalic and atraumatic Eyes.  Pupils round and reactive to light no discharge without nystagmus Neck.  Supple nontender no JVD without thyromegaly Cardiac regular rate and rhythm without any extra sounds or murmur heard Abdomen.  Soft nontender positive bowel sounds without rebound Respiratory effort normal no respiratory distress without wheeze Neurologic.  Alert provides name and age follows simple commands.  Speech was dysarthric.  Cranial nerves II to XII intact other than mild left facial weakness Strength left upper extremity 3/5 proximal 4 -/5 distal Left lower extremity 3-4/5 Right lower extremity 4+/5 Right upper extremity 4+/5  He/She  has had improvement in activity tolerance, balance, postural  control as well as  ability to compensate for deficits. He/She has had improvement in functional use RUE/LUE  and RLE/LLE as well as improvement in awareness.  Patient with slow progressive gains ambulates within the room with home mobility rolling walker close supervision.  Able to ambulate to the bathroom and sink.  Perform sit to stand and stand pivot transfers throughout sessions contact-guard minimal assist.  Noted improvement in motor control and coordination.  Performs full body shower with set up needed items contact-guard.  Patient was able to communicate his needs.  Patient did sustain a fall 06/24/2021 when attempting to get out of bed unattended without injury sustained.  Full family teaching ongoing anticipated discharge was for skilled nursing facility versus discharge to home.       Disposition: Discharge to home    Diet: Diabetic diet  Special Instructions: No driving smoking or alcohol  Medications at discharge. 1.  Tylenol as needed 2.  Amiodarone 200 mg p.o. daily 3.  Eliquis 5 mg p.o. twice daily 4.  Aspirin 81 mg p.o. daily 5.  Lipitor 40 mg p.o. nightly 6.  Lexapro 10 mg p.o. nightly 7.  Lantus insulin 10 units nightly 8.  Synthroid 50 mcg p.o. daily 9.  Multivitamin daily 10.  MiraLAX daily hold for loose stools 11.  Lyrica 25 mg p.o. twice daily 12.  Flomax 0.4 mg daily 13.  Amoxicillin 250 mg every 8 hours x 2 days. 14.Prilosec 40 mg BID    30-35 minutes were spent completing discharge summary and discharge planning   Discharge Instructions     Ambulatory referral to Neurology   Complete by: As directed    An appointment is requested in approximately: 4 weeks right MCA infarction   Ambulatory referral to Physical Medicine Rehab   Complete by: As directed    Moderate complexity follow-up 1 to 2 weeks right MCA infarction        Follow-up Information     Kirsteins, Luanna Salk, MD Follow up.   Specialty: Physical Medicine and Rehabilitation Why:  Office to call for appointment Contact information: [place] [address] [phone]         Revankar, Reita Cliche, MD Follow up.   Specialty: Cardiology Why: Call for appointment Contact information: [place]  [place] [address] [phone]                 Signed: Cathlyn Parsons 06/24/2022, 5:20 AM

## 2022-06-23 NOTE — Progress Notes (Signed)
Inpatient Rehabilitation Care Coordinator Discharge Note   Patient Details  Name: Grant [name] MRN: [id number] Date of Birth: [birth year]   Discharge location: Home  Length of Stay: 29 Days  Discharge activity level: MIN A  Home/community participation: Spouse  Patient response BS:JGGEZM Literacy - How often do you need to have someone help you when you read instructions, pamphlets, or other written material from your doctor or pharmacy?: Often  Patient response OQ:HUTMLY Isolation - How often do you feel lonely or isolated from those around you?: Never  Services provided included: MD, RD, PT, OT, SLP, RN, CM, TR, Pharmacy, Neuropsych, SW  Financial Services:  Charity fundraiser Utilized: [employer]  Choices offered to/list presented to: patient and spouse  Follow-up services arranged:  [place]: [place]         Patient response to transportation need: Is the patient able to respond to transportation needs?: Yes In the past 12 months, has lack of transportation kept you from medical appointments or from getting medications?: No In the past 12 months, has lack of transportation kept you from meetings, work, or from getting things needed for daily living?: No    Comments (or additional information):  Patient/Family verbalized understanding of follow-up arrangements:  Yes  Individual responsible for coordination of the follow-up plan: Inez Catalina, spouse  Confirmed correct DME delivered: Dyanne Iha 06/23/2022    Dyanne Iha

## 2022-06-23 NOTE — Progress Notes (Signed)
Physical Therapy Session Note  Patient Details  Name: Grant [name] MRN: [id number] Date of Birth: 08-Oct-1945  Today's Date: 06/23/2022 PT Individual Time: 0804-0903 PT Individual Time Calculation (min): 59 min   Short Term Goals: Week 3:  PT Short Term Goal 1 (Week 3): Pt will ambulate 38f with CGA and no LOB PT Short Term Goal 1 - Progress (Week 3): Met PT Short Term Goal 2 (Week 3): [facility] PT Short Term Goal 2 - Progress (Week 3): Met PT Short Term Goal 3 (Week 3): Pt will tolerate standing >3 mintues to imrpove safety with ADLS and CGA PT Short Term Goal 3 - Progress (Week 3): Met Week 4:  PT Short Term Goal 1 (Week 4): STG=LTG due to ELOS  Skilled Therapeutic Interventions/Progress Updates:  Patient supine and asleep on entrance to room. Patient requires time to become fully alert and is agreeable to PT session.   Patient with no pain complaint at start of session.  Therapeutic Activity: Bed Mobility: Pt performed supine <> sit with Mod I. No cueing required. Dresses EOB with supervision/ setup in provision of clothes.  Transfers: Sit<>stand from EOB with supervision to RW. Pt inially wanting to dress without being watched but related that he is covered by gown and will also require supervision for balance.  Ambulatory transfer from bed to toilet with supervision. Toilets with distant supervision. Provided verbal cues for taking time and maintaining balance. Ambulatory transfer to sink with no LOB, vc for using RW, and no list to L while at sink.   Gait Training:  Pt ambulated 80 ft using RW with close supervision. Is able to ascend/ descend steps with close supervision and vc for safety and maintaining BLE strength/ control throughout. Ambulates back to room from main gym using RW with close supervision.   Patient seated upright on EOB at end of session with brakes locked, bed alarm set, and all needs within reach. New breakfast ordered  and pt setup to complete. NT and RN notified as to pt's disposition.   Therapy Documentation Precautions:  Precautions Precautions: Fall Precaution Comments: LT wrist pain with WB (fall 2 months ago on hand. No imaging) Restrictions Weight Bearing Restrictions: No General:   Vital Signs:   Pain:  No pain related this session  Therapy/Group: Individual Therapy  JAlger SimonsPT, DPT, CSRS 06/23/2022, 10:25 AM

## 2022-06-23 NOTE — Progress Notes (Signed)
Occupational Therapy Session Note  Patient Details  Name: Grant [name] MRN: [id number] Date of Birth: 11/26/1945  Today's Date: 06/23/2022 OT Individual Time: 0915-1000 OT Individual Time Calculation (min): 45 min    Short Term Goals: Week 1:  OT Short Term Goal 1 (Week 1): Pt will demonstrate improved LUE coordination to don shirt with min A. OT Short Term Goal 1 - Progress (Week 1): Met OT Short Term Goal 2 (Week 1): Pt will demonstrate improved dynamic standing balance to pull pants over hips with mod A or less to support balance OT Short Term Goal 2 - Progress (Week 1): Met OT Short Term Goal 3 (Week 1): Pt will complete RW transfers to toilet with CGA or less. OT Short Term Goal 3 - Progress (Week 1): Progressing toward goal OT Short Term Goal 4 (Week 1): Pt will demonstrate improved L side awareness during bathing to wash UB with min cues to attend to L arm. OT Short Term Goal 4 - Progress (Week 1): Met Week 2:  OT Short Term Goal 1 (Week 2): Pt will be able to hold static stand balance with no UE support with CGA or less to prep for LB dressing. OT Short Term Goal 1 - Progress (Week 2): Revised due to lack of progress OT Short Term Goal 2 (Week 2): Pt will be able pull pants over hips with min A using 1 hand support on grab bar or rail. OT Short Term Goal 2 - Progress (Week 2): Revised due to lack of progress OT Short Term Goal 3 (Week 2): Pt will demostrate improved L side coordination to don a overhead shirt with supervision. OT Short Term Goal 3 - Progress (Week 2): Progressing toward goal Week 3:  OT Short Term Goal 1 (Week 3): STGs=LTGs due to pt's length of stay. Week 4:  OT Short Term Goal 1 (Week 4): STGs=LTGs due to patient's length of stay.  Skilled Therapeutic Interventions/Progress Updates:    Pt received sitting EOB finishing his breakfast. Discussed with him his desire to go home soon vs nursing home. Called his wife to ask her to come in for the 2pm session of  PT to do hands on practice with various transfers to ensure she can manage him safely.  Pt completed a sit to stand to RW with S and no cues. Ambulated to sink with light CGA.  Stood at sink to engage in oral care, brushing hair, etc.  Pt had a continual L lean when dual tasking.  Cued pt to stand with L foot wider for larger BOS and to look at himself in mirror to correct alignment.  Pt need min A to hold balance with constant reminders to shift to his R.  Pt sat in an arm chair to rest and then used RW to ambulate to wc with close S.  Sat in wc and leg rests adjusted for pt. Belt alarm on and all needs met.    Therapy Documentation Precautions:  Precautions Precautions: Fall Precaution Comments: LT wrist pain with WB (fall 2 months ago on hand. No imaging) Restrictions Weight Bearing Restrictions: No   Pain: no c/o pain       Therapy/Group: Individual Therapy  SAGUIER,JULIA 06/23/2022, 9:52 AM 

## 2022-06-23 NOTE — Progress Notes (Signed)
Patient ID: Grant [name], male   DOB: 05-21-1946, 77 y.o.   MRN: [id number]  Team Conference Report to Patient/Family  Team Conference discussion was reviewed with the patient and caregiver, including goals, any changes in plan of care and target discharge date.  Patient and caregiver express understanding and are in agreement.  The patient has a target discharge date of 06/24/22.  SW spoke with patient spouse via telephone and provided updates. Spouse anticipates to be here today at 1PM to complete family education in anticipation to take patient home tomorrow for d/c. [facility] informed. Dyanne Iha 06/23/2022, 1:43 PM

## 2022-06-23 NOTE — Progress Notes (Signed)
PROGRESS NOTE   Subjective/Complaints:   Family now wanting to take pt home   ROS-  Pt denies SOB, abd pain, CP, N/V/C/D, and vision changes  Objective:   No results found. No results for input(s): "WBC", "HGB", "HCT", "PLT" in the last 72 hours.   No results for input(s): "NA", "K", "CL", "CO2", "GLUCOSE", "BUN", "CREATININE", "CALCIUM" in the last 72 hours.    Intake/Output Summary (Last 24 hours) at 06/23/2022 0823 Last data filed at 06/22/2022 1700 Gross per 24 hour  Intake 360 ml  Output --  Net 360 ml         Physical Exam: Vital Signs Blood pressure 120/70, pulse 75, temperature 97.7 F (36.5 C), temperature source Oral, resp. rate 16, height _0  (1.88 m), weight 98.4 kg, SpO2 100 %.  General: No acute distress Mood and affect are appropriate Heart: Regular rate and rhythm no rubs murmurs or extra sounds Lungs: Clear to auscultation, breathing unlabored, no rales or wheezes Abdomen: Positive bowel sounds, soft nontender to palpation, nondistended Extremities: No clubbing, cyanosis, or edema Skin: No evidence of breakdown, no evidence of rash   Neurologic: Alert and oriented x3,  follows commands, Cranial nerves II through XII grossly intact, motor strength is 4-4+/5 in bilateral deltoid, bicep, tricep, grip, hip flexor, knee extensors, ankle dorsiflexor and plantar flexor Sensory exam reduced  sensation to light touch  in right upper and left , has intact proprioception LLE  Musculoskeletal: right hand intrinsic atrophy    Assessment/Plan: 1. Functional deficits which require 3+ hours per day of interdisciplinary therapy in a comprehensive inpatient rehab setting. Physiatrist is providing close team supervision and 24 hour management of active medical problems listed below. Physiatrist and rehab team continue to assess barriers to discharge/monitor patient progress toward functional and medical  goals  Care Tool:  Bathing    Body parts bathed by patient: Right arm, Left arm, Chest, Abdomen, Front perineal area, Buttocks, Right upper leg, Left upper leg, Right lower leg, Left lower leg, Face   Body parts bathed by helper: Front perineal area, Left lower leg, Right lower leg, Right arm     Bathing assist Assist Level: Contact Guard/Touching assist     Upper Body Dressing/Undressing Upper body dressing   What is the patient wearing?: Pull over shirt    Upper body assist Assist Level: Supervision/Verbal cueing    Lower Body Dressing/Undressing Lower body dressing      What is the patient wearing?: Underwear/pull up, Pants     Lower body assist Assist for lower body dressing: Contact Guard/Touching assist     Toileting Toileting    Toileting assist Assist for toileting: Moderate Assistance - Patient 50 - 74%     Transfers Chair/bed transfer  Transfers assist     Chair/bed transfer assist level: Minimal Assistance - Patient > 75%     Locomotion Ambulation   Ambulation assist      Assist level: Minimal Assistance - Patient > 75% Assistive device: Walker-rolling (required assitance w/ maintaing left hand on walker and RW navigation) Max distance: 60f   Walk 10 feet activity   Assist     Assist level: Minimal Assistance -  Patient > 75% (required assitance w/ maintaing left hand on walker and RW navigation) Assistive device: Walker-rolling   Walk 50 feet activity   Assist    Assist level: Minimal Assistance - Patient > 75% (required assitance w/ maintaing left hand on walker and RW navigation) Assistive device: Walker-rolling    Walk 150 feet activity   Assist Walk 150 feet activity did not occur: Safety/medical concerns         Walk 10 feet on uneven surface  activity   Assist Walk 10 feet on uneven surfaces activity did not occur: Safety/medical concerns         Wheelchair     Assist Is the patient using a  wheelchair?: Yes Type of Wheelchair: Manual    Wheelchair assist level: Moderate Assistance - Patient 50 - 74% Max wheelchair distance: 42f (Using RUE & RLE)    Wheelchair 50 feet with 2 turns activity    Assist        Assist Level: Moderate Assistance - Patient 50 - 74%   Wheelchair 150 feet activity     Assist      Assist Level: Maximal Assistance - Patient 25 - 49%   Blood pressure 120/70, pulse 75, temperature 97.7 F (36.5 C), temperature source Oral, resp. rate 16, height _0  (1.88 m), weight 98.4 kg, SpO2 100 %.  Medical Problem List and Plan: 1. Functional deficits secondary to subacute right MCA ischemic infarction             -patient may  shower             -ELOS/Goals:  approved for SNF by [facility] family reviewing approved facility Con't CIR- PT, OT and SLP until d/c Team conference today please see physician documentation under team conference tab, met with team  to discuss problems,progress, and goals. Formulized individual treatment plan based on medical history, underlying problem and comorbidities. Ready medically for discharge  2.  Antithrombotics: -DVT/anticoagulation:  Pharmaceutical: Eliquis             -antiplatelet therapy: Aspirin 81 mg daily 3. Pain Management: continue Neurontin 300 mg 3 times daily, reduce nortriptyline to 574mmg nightly, Flexeril 10 mg nightly as needed             off gabapentin due to drowsiness no worsening of pain 12/15  -improved on low dose lyrica 2544mID for neuropathic pain  12/31- pain controlled - con't regimen 4. Depression: continue Lexapro 10 mg nightly, d/c nortriptyline              -antipsychotic agents: N/A 5. Neuropsych/cognition: This patient is capable of making decisions on his own behalf. 6. Skin/Wound Care: Routine skin checks 7. Fluids/Electrolytes/Nutrition: Routine in and outs with follow-up chemistries 8.  Atrial fibrillation/AICD.  Continue amiodarone 200 mg daily as well as Eliquis.   Cardiac rate controlled 9.  Diabetes mellitus.  Hemoglobin A1c 9.9.  Decrease semglee to 16U.  SSI. Check blood sugars before meals and at bedtime -12/28 decrease semglee to 10u CBG (last 3)  Recent Labs    06/22/22 1622 06/22/22 2052 06/23/22 0613  GLUCAP 111* 143* 97   Controlled 06/23/22  12/31- CBG's controlled- con't regimen 10.  Hyperlipidemia. continue Lipitor 45m69mily 11.  Chronic systolic congestive heart failure.  Monitor for any signs of fluid overload. On Furosemide 20mg60mly.  Fluid intake poor will check BMET check daily weight  12/31- weight up a little today- will monitor for trend- pt breathing well.  12.  BPH.  hytrin switched to flomax due to soft BPs off nortriptyline due to retention concerns  13.  Tobacco use.  Provide counseling 14.  Hypothyroidism.  Synthroid 15.  CKD.  Baseline creatinine 1.43-1.94.  Follow-up chemistries  -Recheck is stable 16. HTN. Long term goal normotensive. Home medications metoprolol and cozaar currently on hold. Monitor with activity. Vitals:   06/22/22 2051 06/23/22 0451  BP: 118/74 120/70  Pulse: 75 75  Resp:  16  Temp:  97.7 F (36.5 C)  SpO2:    Controlled 1/3 17. Hypothyroidism. Continue synthroid   18.  E coli UTI, completed keflex  -recurrent , >100KGNR  pansensitive on amoxil 250 mg TID.  20.  Urinary retention started on flomax 12/26- voiding continenetly - f/u with uro    LOS: 28 days A FACE TO [place] E Logen Heintzelman 06/23/2022, 8:23 AM

## 2022-06-24 ENCOUNTER — Other Ambulatory Visit (HOSPITAL_COMMUNITY): Payer: Self-pay

## 2022-06-24 LAB — GLUCOSE, CAPILLARY
Glucose-Capillary: 97 mg/dL (ref 70–99)
Glucose-Capillary: 99 mg/dL (ref 70–99)
Glucose-Capillary: 85 mg/dL (ref 70–99)

## 2022-06-24 NOTE — Progress Notes (Signed)
   06/24/22 0530  What Happened  Was fall witnessed? No  Was patient injured? No  Patient found on floor;in bathroom  Found by Staff-comment Ernestine Mcmurray, LPN)  Stated prior activity bathroom-unassisted  Provider Notification  Provider Name/Title Marlowe Shores  Date Provider Notified 06/24/22  Time Provider Notified [phone]  Method of Notification Face-to-face  Notification Reason Fall  Provider response At bedside  Date of Provider Response 06/24/22  Time of Provider Response 0545  Follow Up  Family notified No - patient refusal  Adult Fall Risk Assessment  Risk Factor Category (scoring not indicated) Fall has occurred during this admission (document High fall risk)  Patient Fall Risk Level High fall risk  Adult Fall Risk Interventions  Required Bundle Interventions *See Row Information* High fall risk - low, moderate, and high requirements implemented  Additional Interventions Use of appropriate toileting equipment (bedpan, BSC, etc.);Lap belt while in chair/wheelchair (Rehab only);PT/OT need assessed if change in mobility from baseline;HeadStart bed sensor (education/return demonstration);HeadStart chair sensor (education/return demonstration)  Screening for Fall Injury Risk (To be completed on HIGH fall risk patients) - Assessing Need for Floor Mats  Risk For Fall Injury- Criteria for Floor Mats Confusion/dementia (+NuDESC, CIWA, TBI, etc.)  Will Implement Floor Mats Yes  Vitals  Temp 98.1 F (36.7 C)  Temp Source Oral  BP 110/85  MAP (mmHg) 95  BP Location Left Arm  BP Method Automatic  Patient Position (if appropriate) Lying  Pulse Rate 85  Pulse Rate Source Monitor  Resp 16  Oxygen Therapy  SpO2 100 %  O2 Device Room Air  Pain Assessment  Pain Score 0  Neurological  Neuro (WDL) X  Level of Consciousness Alert  Orientation Level Oriented to person;Oriented to place;Oriented to situation  Cognition Poor judgement;Poor safety awareness  Speech Clear  R Hand Grip  Moderate  L Hand Grip Weak   RUE Motor Response Purposeful movement  LUE Motor Response Purposeful movement  RLE Motor Response Purposeful movement  LLE Motor Response Purposeful movement  Musculoskeletal  Musculoskeletal (WDL) WDL  Musculoskeletal Details  LUE Weakness  LLE Weakness  Integumentary  Integumentary (WDL) X  Skin Color Appropriate for ethnicity  Skin Condition Dry  Skin Integrity Intact  Abrasion Location Leg  Abrasion Location Orientation Left;Lower  Abrasion Intervention Other (Comment) (pt scratched it.)  Skin Turgor Non-tenting

## 2022-06-24 NOTE — Progress Notes (Signed)
Occupational Therapy Discharge Summary  Patient Details  Name: Grant [name] MRN: [id number] Date of Birth: 11-05-1945  Date of Discharge from OT service:June 24, 2022   Patient has met 11 of 11 long term goals due to improved activity tolerance, improved balance, postural control, ability to compensate for deficits, functional use of  LEFT upper and LEFT lower extremity, improved attention, improved awareness, and improved coordination.  Patient to discharge at [facility] Assist level.    Patient's care partner is independent to provide the necessary physical and cognitive assistance at discharge.  Wife attended 1 OT family education session earlier in his stay and a PT session the day before discharge to review transfers and ambulation safety with his wife.  Patient will need 24/7 supervision as he is at high fall risk due to his limited safety awareness.   Reasons goals not met: n/a  Recommendation:  Patient will benefit from ongoing skilled OT services in home health setting to continue to advance functional skills in the area of BADL.  Equipment: BSC and shower chair  Reasons for discharge: treatment goals met  Patient/family agrees with progress made and goals achieved: Yes  OT Discharge Precautions/Restrictions  Precautions Precautions: Fall Precaution Comments: no further wrist pain Restrictions Weight Bearing Restrictions: No ADL ADL Eating: Independent Grooming: Contact guard Where Assessed-Grooming: Standing at sink Upper Body Bathing: Supervision/safety Where Assessed-Upper Body Bathing: Shower Lower Body Bathing: Contact guard Where Assessed-Lower Body Bathing: Shower Upper Body Dressing: Supervision/safety Where Assessed-Upper Body Dressing: Chair Lower Body Dressing: Minimal assistance Where Assessed-Lower Body Dressing: Chair Toileting: Minimal assistance Where Assessed-Toileting: Glass blower/designer: Therapist, music Method:  Counselling psychologist: Raised toilet seat Tub/Shower Transfer: Not assessed Social research officer, government: Minimal cueing Vision Baseline Vision/History: 1 Wears glasses Patient Visual Report: No change from baseline Vision Assessment?: Yes Eye Alignment: Within Functional Limits Ocular Range of Motion: Within Functional Limits Alignment/Gaze Preference: Within Defined Limits Tracking/Visual Pursuits: Able to track stimulus in all quads without difficulty Convergence: Within functional limits Visual Fields: No apparent deficits Perception  Perception: Impaired Inattention/Neglect: Does not attend to left side of body (L side body awareness has improved, but continues to need min cues) Praxis Praxis: Impaired Praxis Impairment Details: Motor planning Praxis-Other Comments: dressing praxis now [facility], pt needs cues with motor planning for stepping back and turning with transfers Cognition Cognition Overall Cognitive Status: Impaired/Different from baseline Arousal/Alertness: Awake/alert Orientation Level: Person;Place;Situation Person: Oriented Place: Oriented Situation: Oriented Memory: Impaired Memory Impairment: Decreased recall of new information;Retrieval deficit Attention: Sustained;Focused;Selective Focused Attention: Appears intact Sustained Attention: Appears intact Selective Attention: Impaired Selective Attention Impairment: Verbal basic Awareness: Impaired Awareness Impairment: Emergent impairment Problem Solving: Impaired Problem Solving Impairment: Verbal basic Safety/Judgment: Impaired Comments: Pt continues to believe that he can get up by himself. Needs 24/7 supervision. Brief Interview for Mental Status (BIMS) Repetition of Three Words (First Attempt): 3 Temporal Orientation: Year: Correct Temporal Orientation: Month: Accurate within 5 days Temporal Orientation: Day: Correct Recall: "Sock": Yes, no cue required Recall: "Blue": Yes, no cue  required Recall: "Bed": Yes, after cueing ("a piece of furniture") BIMS Summary Score: 14 Sensation Sensation Light Touch: Impaired by gross assessment Hot/Cold: Impaired by gross assessment Proprioception: Impaired by gross assessment Stereognosis: Impaired by gross assessment Additional Comments: Decrease sensation on L side of body, unable accuratley sense light touch or sustained pressure on L side of body Coordination Gross Motor Movements are Fluid and Coordinated: No Fine Motor Movements are Fluid and Coordinated: No Finger Nose  Finger Test: Mild Dysmetria on left Motor  Motor Motor: Hemiplegia;Abnormal postural alignment and control;Motor apraxia Motor - Discharge Observations: Pt has made strong improvements from admission, although he continues to have great difficulty with postural control when doing dual tasking (ie standing and brushing teeth) Mobility  Bed Mobility Rolling Left: Independent Supine to Sit: Independent Sitting - Scoot to Edge of Bed: Supervision/Verbal cueing Transfers Sit to Stand: Contact Guard/Touching assist Stand to Sit: Contact Guard/Touching assist  Trunk/Postural Assessment  Postural Control Postural Control: Deficits on evaluation Trunk Control: posterior and Left lean with ambulation and standing that increases when pt is distracted, not fully attending to L side or multitasking  Balance Static Sitting Balance Static Sitting - Level of Assistance: 7: Independent Dynamic Sitting Balance Dynamic Sitting - Level of Assistance: 5: Stand by assistance;4: Min assist Static Standing Balance Static Standing - Level of Assistance: 4: Min assist Dynamic Standing Balance Dynamic Standing - Level of Assistance: 3: Mod assist Dynamic Standing - Comments: fluctuates from supervision to mod A depending on pt's attention to his posture and L side Extremity/Trunk Assessment RUE Assessment RUE Assessment: Within Functional Limits LUE Assessment Passive  Range of Motion (PROM) Comments: sh flexion to 160 Active Range of Motion (AROM) Comments: sh flexion 110. elbow [facility] General Strength Comments: 4/ 5 throughout - incoordinated movement, limited sensation, grasp 45 lbs.  Improved from admission   [facility] 06/24/2022, 7:39 PM

## 2022-06-24 NOTE — Progress Notes (Signed)
Discharged to home per wheelchair/car accompanied by NT/ wife. Discharge instructions done by [facility]. No questions noted.

## 2022-06-24 NOTE — Progress Notes (Signed)
Inpatient Rehabilitation Discharge Medication Review by a Pharmacist  A complete drug regimen review was completed for this patient to identify any potential clinically significant medication issues.  High Risk Drug Classes Is patient taking? Indication by Medication  Antipsychotic No   Anticoagulant Yes Apixaban - afib, stroke ppx  Antibiotic Yes Amoxicillin- E.coli UTI  Opioid No   Antiplatelet No ASA - CAD  Hypoglycemics/insulin Yes Insulin glargine - DM  Vasoactive Medication Yes Amiodarone - afib  Chemotherapy No   Other Yes Acetaminophen- prn mild pain or fever Atorvastatin - HLD Escitalopram - depression Gabapentin - neuropathy/pain Lyrica- pain Levothyroxine - hypothyroid Omeprazole-GERD Tamsulosin- BPH Ntiroglycerin SL- prn chest pain MVI - supplement Miralax - constipation     Type of Medication Issue Identified Description of Issue Recommendation(s)  Drug Interaction(s) (clinically significant)     Duplicate Therapy     Allergy     No Medication Administration End Date     Incorrect Dose     Additional Drug Therapy Needed     Significant med changes from prior encounter (inform family/care partners about these prior to discharge). PTA metoprolol , losartan, furosemide. Held this admission and discontinued on discharged.  Other       Clinically significant medication issues were identified that warrant physician communication and completion of prescribed/recommended actions by midnight of the next day:  No   Pharmacist comments: n/a   Time spent performing this drug regimen review (minutes): 20   Thank you for allowing pharmacy to be a part of this patient's care.  Nicole Cella, RPh Clinical Pharmacist 06/24/2022  1:00 PM

## 2022-06-24 NOTE — Plan of Care (Signed)
  Problem: RH Balance Goal: LTG: Patient will maintain dynamic sitting balance (OT) Description: LTG:  Patient will maintain dynamic sitting balance with assistance during activities of daily living (OT) Outcome: Completed/Met Goal: LTG Patient will maintain dynamic standing with ADLs (OT) Description: LTG:  Patient will maintain dynamic standing balance with assist during activities of daily living (OT)  Outcome: Completed/Met   Problem: Sit to Stand Goal: LTG:  Patient will perform sit to stand in prep for activites of daily living with assistance level (OT) Description: LTG:  Patient will perform sit to stand in prep for activites of daily living with assistance level (OT) Outcome: Completed/Met   Problem: RH Bathing Goal: LTG Patient will bathe all body parts with assist levels (OT) Description: LTG: Patient will bathe all body parts with assist levels (OT) Outcome: Completed/Met   Problem: RH Dressing Goal: LTG Patient will perform upper body dressing (OT) Description: LTG Patient will perform upper body dressing with assist, with/without cues (OT). Outcome: Completed/Met Goal: LTG Patient will perform lower body dressing w/assist (OT) Description: LTG: Patient will perform lower body dressing with assist, with/without cues in positioning using equipment (OT) Outcome: Completed/Met   Problem: RH Toileting Goal: LTG Patient will perform toileting task (3/3 steps) with assistance level (OT) Description: LTG: Patient will perform toileting task (3/3 steps) with assistance level (OT)  Outcome: Completed/Met   Problem: RH Functional Use of Upper Extremity Goal: LTG Patient will use RT/LT upper extremity as a (OT) Description: LTG: Patient will use right/left upper extremity as a stabilizer/gross assist/diminished/nondominant/dominant level with assist, with/without cues during functional activity (OT) Outcome: Completed/Met   Problem: RH Toilet Transfers Goal: LTG Patient will  perform toilet transfers w/assist (OT) Description: LTG: Patient will perform toilet transfers with assist, with/without cues using equipment (OT) Outcome: Completed/Met   Problem: RH Tub/Shower Transfers Goal: LTG Patient will perform tub/shower transfers w/assist (OT) Description: LTG: Patient will perform tub/shower transfers with assist, with/without cues using equipment (OT) Outcome: Completed/Met   Problem: RH Awareness Goal: LTG: Patient will demonstrate awareness during functional activites type of (OT) Description: LTG: Patient will demonstrate awareness during functional activites type of (OT) Outcome: Completed/Met

## 2022-06-24 NOTE — Progress Notes (Addendum)
PROGRESS NOTE   Subjective/Complaints:   Family now wanting to take pt home , was to come in for family training , no therapy notes to indicate that this occurred   ROS-  Pt denies SOB, abd pain, CP, N/V/C/D, and vision changes  Objective:   No results found. No results for input(s): "WBC", "HGB", "HCT", "PLT" in the last 72 hours.   No results for input(s): "NA", "K", "CL", "CO2", "GLUCOSE", "BUN", "CREATININE", "CALCIUM" in the last 72 hours.    Intake/Output Summary (Last 24 hours) at 06/24/2022 0736 Last data filed at 06/23/2022 1839 Gross per 24 hour  Intake 120 ml  Output --  Net 120 ml         Physical Exam: Vital Signs Blood pressure 110/85, pulse 85, temperature 98.1 F (36.7 C), temperature source Oral, resp. rate 16, height 6\' 2"  (1.88 m), weight 98.5 kg, SpO2 100 %.  General: No acute distress Mood and affect are appropriate Heart: Regular rate and rhythm no rubs murmurs or extra sounds Lungs: Clear to auscultation, breathing unlabored, no rales or wheezes Abdomen: Positive bowel sounds, soft nontender to palpation, nondistended Extremities: No clubbing, cyanosis, or edema Skin: No evidence of breakdown, no evidence of rash   Neurologic: Alert and oriented x3,  follows commands, Cranial nerves II through XII grossly intact, motor strength is 4-4+/5 in bilateral deltoid, bicep, tricep, grip, hip flexor, knee extensors, ankle dorsiflexor and plantar flexor Sensory exam reduced  sensation to light touch  in right upper and left , has intact proprioception LLE  Musculoskeletal: right hand intrinsic atrophy    Assessment/Plan: 1. Functional deficits which require 3+ hours per day of interdisciplinary therapy in a comprehensive inpatient rehab setting. Physiatrist is providing close team supervision and 24 hour management of active medical problems listed below. Physiatrist and rehab team continue to  assess barriers to discharge/monitor patient progress toward functional and medical goals  Care Tool:  Bathing    Body parts bathed by patient: Right arm, Left arm, Chest, Abdomen, Front perineal area, Buttocks, Right upper leg, Left upper leg, Right lower leg, Left lower leg, Face   Body parts bathed by helper: Front perineal area, Left lower leg, Right lower leg, Right arm     Bathing assist Assist Level: Contact Guard/Touching assist     Upper Body Dressing/Undressing Upper body dressing   What is the patient wearing?: Pull over shirt    Upper body assist Assist Level: Supervision/Verbal cueing    Lower Body Dressing/Undressing Lower body dressing      What is the patient wearing?: Underwear/pull up, Pants     Lower body assist Assist for lower body dressing: Contact Guard/Touching assist     Toileting Toileting    Toileting assist Assist for toileting: Supervision/Verbal cueing     Transfers Chair/bed transfer  Transfers assist     Chair/bed transfer assist level: Minimal Assistance - Patient > 75%     Locomotion Ambulation   Ambulation assist      Assist level: Minimal Assistance - Patient > 75% Assistive device: Walker-rolling (required assitance w/ maintaing left hand on walker and RW navigation) Max distance: 9ft   Walk 10 feet activity  Assist     Assist level: Minimal Assistance - Patient > 75% (required assitance w/ maintaing left hand on walker and RW navigation) Assistive device: Walker-rolling   Walk 50 feet activity   Assist    Assist level: Minimal Assistance - Patient > 75% (required assitance w/ maintaing left hand on walker and RW navigation) Assistive device: Walker-rolling    Walk 150 feet activity   Assist Walk 150 feet activity did not occur: Safety/medical concerns         Walk 10 feet on uneven surface  activity   Assist Walk 10 feet on uneven surfaces activity did not occur: Safety/medical  concerns         Wheelchair     Assist Is the patient using a wheelchair?: Yes Type of Wheelchair: Manual    Wheelchair assist level: Moderate Assistance - Patient 50 - 74% Max wheelchair distance: 56ft (Using RUE & RLE)    Wheelchair 50 feet with 2 turns activity    Assist        Assist Level: Moderate Assistance - Patient 50 - 74%   Wheelchair 150 feet activity     Assist      Assist Level: Maximal Assistance - Patient 25 - 49%   Blood pressure 110/85, pulse 85, temperature 98.1 F (36.7 C), temperature source Oral, resp. rate 16, height 6\' 2"  (1.88 m), weight 98.5 kg, SpO2 100 %.  Medical Problem List and Plan: 1. Functional deficits secondary to subacute right MCA ischemic infarction             -patient may  shower             -ELOS/Goals:family training for home d/c , tentatively 1/4 or 1/5 Ready medically for discharge  2.  Antithrombotics: -DVT/anticoagulation:  Pharmaceutical: Eliquis             -antiplatelet therapy: Aspirin 81 mg daily 3. Pain Management: continue Neurontin 300 mg 3 times daily, reduce nortriptyline to 50mg  mg nightly, Flexeril 10 mg nightly as needed             off gabapentin due to drowsiness no worsening of pain 12/15  -improved on low dose lyrica 25mg  BID for neuropathic pain  12/31- pain controlled - con't regimen 4. Depression: continue Lexapro 10 mg nightly, d/c nortriptyline              -antipsychotic agents: N/A 5. Neuropsych/cognition: This patient is capable of making decisions on his own behalf. 6. Skin/Wound Care: Routine skin checks 7. Fluids/Electrolytes/Nutrition: Routine in and outs with follow-up chemistries 8.  Atrial fibrillation/AICD.  Continue amiodarone 200 mg daily as well as Eliquis.  Cardiac rate controlled 9.  Diabetes mellitus.  Hemoglobin A1c 9.9.  Decrease semglee to 16U.  SSI. Check blood sugars before meals and at bedtime -12/28 decrease semglee to 10u CBG (last 3)  Recent Labs     06/23/22 1707 06/23/22 2102 06/24/22 0534  GLUCAP 129* 158* 97   Controlled 06/23/22  12/31- CBG's controlled- con't regimen 10.  Hyperlipidemia. continue Lipitor 40mg  daily 11.  Chronic systolic congestive heart failure.  Monitor for any signs of fluid overload. On Furosemide 20mg  daily.  Fluid intake poor will check BMET check daily weight  12/31- weight up a little today- will monitor for trend- pt breathing well.   12.  BPH.  hytrin switched to flomax due to soft BPs off nortriptyline due to retention concerns  13.  Tobacco use.  Provide counseling 14.  Hypothyroidism.  Synthroid 15.  CKD.  Baseline creatinine 1.43-1.94.  Follow-up chemistries  -Recheck is stable 16. HTN. Long term goal normotensive. Home medications metoprolol and cozaar currently on hold. Monitor with activity. Vitals:   06/23/22 1804 06/24/22 0530  BP: 116/80 110/85  Pulse: 80 85  Resp: 20 16  Temp: 97.9 F (36.6 C) 98.1 F (36.7 C)  SpO2: 99% 100%  Controlled 1/3 17. Hypothyroidism. Continue synthroid   18.  E coli UTI, completed keflex  -recurrent , >100KGNR  pansensitive on amoxil 250 mg TID.  20.  Urinary retention started on flomax 12/26- voiding continenetly - f/u with uro    LOS: 29 days A FACE TO [place] [facility] Gregg Holster 06/24/2022, 7:36 AM

## 2022-06-24 NOTE — Progress Notes (Signed)
Physical Therapy Discharge Summary  Patient Details  Name: Grant [name] MRN: [id number] Date of Birth: December 31, 1945  Date of Discharge from PT service:June 23, 2022  Today's Date: 06/25/2022     Patient has met 27 of 14 long term goals due to improved activity tolerance, improved balance, improved postural control, increased strength, increased range of motion, decreased pain, ability to compensate for deficits, functional use of  left upper extremity and left lower extremity, improved attention, improved awareness, and improved coordination.  Patient to discharge at an ambulatory level Bernice.   Patient's care partner is independent to provide the necessary physical and cognitive assistance at discharge.  Reasons goals not met: all PT goals met   Recommendation:  Patient will benefit from ongoing skilled PT services in home health setting to continue to advance safe functional mobility, address ongoing impairments in balance, awareness, safety, coordination, and minimize fall risk.  Equipment: RW and WC   Reasons for discharge: treatment goals met and discharge from hospital  Patient/family agrees with progress made and goals achieved: Yes  PT Discharge    Pain Interference Pain Interference Pain Effect on Sleep: 1. Rarely or not at all Pain Interference with Therapy Activities: 1. Rarely or not at all Pain Interference with Day-to-Day Activities: 1. Rarely or not at all Vision/Perception  Vision - Assessment Eye Alignment: Within Functional Limits Ocular Range of Motion: Within Functional Limits Alignment/Gaze Preference: Within Defined Limits Tracking/Visual Pursuits: Able to track stimulus in all quads without difficulty Convergence: Within functional limits Perception Perception: Impaired Inattention/Neglect: Does not attend to left side of body Praxis Praxis: Impaired Praxis Impairment Details: Motor planning  Cognition Overall Cognitive Status:  Impaired/Different from baseline Memory: Impaired Memory Impairment: Decreased recall of new information;Retrieval deficit Awareness: Impaired Awareness Impairment: Emergent impairment Problem Solving: Impaired Problem Solving Impairment: Verbal basic Safety/Judgment: Impaired Comments: Pt continues to believe that he can get up by himself. Needs 24/7 supervision. Sensation Sensation Light Touch: Impaired by gross assessment Hot/Cold: Impaired by gross assessment Proprioception: Impaired by gross assessment Stereognosis: Impaired by gross assessment Additional Comments: Decrease sensation on L side of body, unable accuratley sense light touch or sustained pressure on L side of body Coordination Gross Motor Movements are Fluid and Coordinated: No Fine Motor Movements are Fluid and Coordinated: No Coordination and Movement Description: improved from Eval Finger Nose Finger Test: Mild Dysmetria on left Motor  Motor Motor: Hemiplegia;Abnormal postural alignment and control;Motor apraxia Motor - Discharge Observations: Pt has made strong improvements from admission, although he continues to have great difficulty with postural control when doing dual tasking (ie standing and brushing teeth)  Mobility Bed Mobility Rolling Left: Independent Supine to Sit: Independent Sitting - Scoot to Edge of Bed: Supervision/Verbal cueing Transfers Sit to Stand: Contact Guard/Touching assist Stand to Sit: Contact Guard/Touching assist Stand Pivot Transfers: Contact Guard/Touching assist Locomotion  Gait Ambulation: Yes Gait Assistance: Contact Guard/Touching assist Gait Distance (Feet): 200 Feet Assistive device: Rolling walker Gait Gait: Yes Gait Pattern: Impaired Gait Pattern: Ataxic;Narrow base of support Stairs / Additional Locomotion Stairs: Yes Stairs Assistance: Contact Guard/Touching assist Stair Management Technique: Two rails Number of Stairs: 12 Height of Stairs: 6 Wheelchair  Mobility Wheelchair Mobility: No Wheelchair Assistance: Chartered loss adjuster: Both upper extremities Distance: 150   Balance Static Sitting Balance Static Sitting - Level of Assistance: 7: Independent Dynamic Sitting Balance Dynamic Sitting - Level of Assistance: 5: Stand by assistance;4: Min assist Static Standing Balance Static Standing - Level of Assistance: 4: Min  assist Dynamic Standing Balance Dynamic Standing - Balance Support: Bilateral upper extremity supported Dynamic Standing - Level of Assistance: 4: Min assist Extremity Assessment      RLE Assessment RLE Assessment: Within Functional Limits General Strength Comments: 4+/5 for knee flexion/extension, DF, & PF,  4+5 for all hip directions LLE Assessment LLE Assessment: Exceptions to [facility] LLE Strength LLE Overall Strength: Deficits Left Hip Flexion: 4-/5 Left Hip Extension: 4-/5 Left Hip ABduction: 4/5 Left Hip ADduction: 4/5 Left Knee Flexion: 4-/5 Left Knee Extension: 4/5 Left Ankle Dorsiflexion: 4-/5 Left Ankle Plantar Flexion: 4-/5   [name] 06/25/2022, 7:55 AM

## 2022-06-25 NOTE — Progress Notes (Signed)
Patient ID: Grant [name], male   DOB: October 09, 1945, 77 y.o.   MRN: [id number]  1/5 12:41 PM: Sw received call from pt spouse informing sw that patient has fallen 2x since discharging home. Spouse called 911 both times to assist patient get up. Spouse inquiring about SNF placement. Sp[facility] to let [facility] know she is now interested to SNF placement so they are able to begin the authorization process. Spouse will FU with SW.

## 2022-06-25 NOTE — Plan of Care (Signed)
  Problem: RH Balance Goal: LTG Patient will maintain dynamic sitting balance (PT) Description: LTG:  Patient will maintain dynamic sitting balance with assistance during mobility activities (PT) Outcome: Completed/Met Goal: LTG Patient will maintain dynamic standing balance (PT) Description: LTG:  Patient will maintain dynamic standing balance with assistance during mobility activities (PT) Outcome: Completed/Met   Problem: Sit to Stand Goal: LTG:  Patient will perform sit to stand with assistance level (PT) Description: LTG:  Patient will perform sit to stand with assistance level (PT) Outcome: Completed/Met   Problem: RH Bed Mobility Goal: LTG Patient will perform bed mobility with assist (PT) Description: LTG: Patient will perform bed mobility with assistance, with/without cues (PT). Outcome: Completed/Met   Problem: RH Bed to Chair Transfers Goal: LTG Patient will perform bed/chair transfers w/assist (PT) Description: LTG: Patient will perform bed to chair transfers with assistance (PT). Outcome: Completed/Met   Problem: RH Ambulation Goal: LTG Patient will ambulate in controlled environment (PT) Description: LTG: Patient will ambulate in a controlled environment, # of feet with assistance (PT). Outcome: Completed/Met Goal: LTG Patient will ambulate in home environment (PT) Description: LTG: Patient will ambulate in home environment, # of feet with assistance (PT). Outcome: Completed/Met Goal: LTG Patient will ambulate in community environment (PT) Description: LTG: Patient will ambulate in community environment, # of feet with assistance (PT). Outcome: Completed/Met   Problem: RH Wheelchair Mobility Goal: LTG Patient will propel w/c in controlled environment (PT) Description: LTG: Patient will propel wheelchair in controlled environment, # of feet with assist (PT) Outcome: Completed/Met Goal: LTG Patient will propel w/c in home environment (PT) Description: LTG: Patient  will propel wheelchair in home environment, # of feet with assistance (PT). Outcome: Completed/Met Goal: LTG Patient will propel w/c in community environment (PT) Description: LTG: Patient will propel wheelchair in community environment, # of feet with assist (PT) Outcome: Completed/Met   Problem: RH Stairs Goal: LTG Patient will ambulate up and down stairs w/assist (PT) Description: LTG: Patient will ambulate up and down # of stairs with assistance (PT) Outcome: Completed/Met   Problem: RH Attention Goal: LTG Patient will demonstrate this level of attention during functional activites (PT) Description: LTG:  Patient will demonstrate this level of attention during functional activites (PT) Outcome: Completed/Met   Problem: RH Awareness Goal: LTG: Patient will demonstrate awareness during functional activites type of (PT) Description: LTG: Patient will demonstrate awareness during functional activites type of (PT) Outcome: Completed/Met

## 2022-07-06 ENCOUNTER — Encounter: Payer: Medicare (Managed Care) | Admitting: Registered Nurse

## 2022-07-07 NOTE — Progress Notes (Signed)
**  Late Entry**   06/24/22 0530  What Happened  Was fall witnessed? No  Was patient injured? No  Patient found on floor;in bathroom  Found by Staff-comment Ernestine Mcmurray, LPN)  Stated prior activity bathroom-unassisted  Provider Notification  Provider Name/Title Marlowe Shores  Date Provider Notified 06/24/22  Time Provider Notified [phone]  Method of Notification Face-to-face  Notification Reason Fall  Provider response At bedside  Date of Provider Response 06/24/22  Time of Provider Response 0545  Follow Up  Family notified No - patient refusal  Adult Fall Risk Assessment  Risk Factor Category (scoring not indicated) Fall has occurred during this admission (document High fall risk)  Adult Fall Risk Interventions  Required Bundle Interventions *See Row Information* High fall risk - low, moderate, and high requirements implemented  Additional Interventions Use of appropriate toileting equipment (bedpan, BSC, etc.);Lap belt while in chair/wheelchair (Rehab only);PT/OT need assessed if change in mobility from baseline;HeadStart bed sensor (education/return demonstration);HeadStart chair sensor (education/return demonstration)  Screening for Fall Injury Risk (To be completed on HIGH fall risk patients) - Assessing Need for Floor Mats  Risk For Fall Injury- Criteria for Floor Mats Confusion/dementia (+NuDESC, CIWA, TBI, etc.)  Will Implement Floor Mats Yes  Vitals  Temp 98.1 F (36.7 C)  Temp Source Oral  BP 110/85  MAP (mmHg) 95  BP Location Left Arm  BP Method Automatic  Patient Position (if appropriate) Lying  Pulse Rate 85  Pulse Rate Source Monitor  Resp 16  Oxygen Therapy  SpO2 100 %  O2 Device Room Air  Pain Assessment  Pain Score 0  Neurological  Neuro (WDL) X  Level of Consciousness Alert  Orientation Level Oriented to person;Oriented to place;Oriented to situation  Cognition Poor judgement;Poor safety awareness  Speech Clear  R Hand Grip Moderate  L Hand Grip Weak    RUE Motor Response Purposeful movement  LUE Motor Response Purposeful movement  RLE Motor Response Purposeful movement  LLE Motor Response Purposeful movement  Musculoskeletal  Musculoskeletal (WDL) WDL  Musculoskeletal Details  LUE Weakness  LLE Weakness  Integumentary  Integumentary (WDL) X  Skin Color Appropriate for ethnicity  Skin Condition Dry  Skin Integrity Intact  Abrasion Location Leg  Abrasion Location Orientation Left;Lower  Abrasion Intervention Other (Comment) (pt scratched it.)  Skin Turgor Non-tenting

## 2022-07-08 ENCOUNTER — Other Ambulatory Visit: Payer: Self-pay

## 2022-07-08 ENCOUNTER — Emergency Department (HOSPITAL_BASED_OUTPATIENT_CLINIC_OR_DEPARTMENT_OTHER): Payer: No Typology Code available for payment source

## 2022-07-08 ENCOUNTER — Emergency Department (HOSPITAL_BASED_OUTPATIENT_CLINIC_OR_DEPARTMENT_OTHER)
Admission: EM | Admit: 2022-07-08 | Discharge: 2022-07-08 | Disposition: A | Discharge status: Home / Self Care | Payer: No Typology Code available for payment source | Attending: Emergency Medicine | Admitting: Emergency Medicine

## 2022-07-08 ENCOUNTER — Encounter (HOSPITAL_BASED_OUTPATIENT_CLINIC_OR_DEPARTMENT_OTHER): Payer: Self-pay | Admitting: Emergency Medicine

## 2022-07-08 DIAGNOSIS — Y9389 Activity, other specified: Secondary | ICD-10-CM | POA: Insufficient documentation

## 2022-07-08 DIAGNOSIS — M25522 Pain in left elbow: Secondary | ICD-10-CM | POA: Diagnosis present

## 2022-07-08 DIAGNOSIS — M7022 Olecranon bursitis, left elbow: Secondary | ICD-10-CM

## 2022-07-08 DIAGNOSIS — Z7901 Long term (current) use of anticoagulants: Secondary | ICD-10-CM | POA: Diagnosis not present

## 2022-07-08 DIAGNOSIS — Z7982 Long term (current) use of aspirin: Secondary | ICD-10-CM | POA: Diagnosis not present

## 2022-07-08 DIAGNOSIS — M79605 Pain in left leg: Secondary | ICD-10-CM

## 2022-07-08 DIAGNOSIS — Z794 Long term (current) use of insulin: Secondary | ICD-10-CM | POA: Insufficient documentation

## 2022-07-08 HISTORY — DX: Cerebral infarction, unspecified: I63.9

## 2022-07-08 NOTE — ED Triage Notes (Signed)
Pt is on doxy

## 2022-07-08 NOTE — ED Triage Notes (Signed)
Pt is on eliquis

## 2022-07-08 NOTE — ED Provider Notes (Signed)
[facility] EMERGENC[facility] DEPT Provider Note   CSN: [id number] Arrival date & time: 07/08/22  1359     History Chief Complaint  Patient presents with   possible dvt    Grant [name] is a 77 y.o. male patient with history of stroke on apixaban who presents to the emergency department with left lower extremity swelling and pain has been ongoing for the last week or so.  Patient was seen evaluated by his primary care doctor secondary to wounds of the left leg and is currently on doxycycline and his wife has been placing some ointment over the left leg.  In addition, patient is also complaining of left elbow pain.  He denies fever, chills, chest pain, shortness of breath.  He has not missed any doses of his apixaban. HPI     Home Medications Prior to Admission medications   Medication Sig Start Date End Date Taking? Authorizing Provider  acetaminophen (TYLENOL) 325 MG tablet Take 2 tablets (650 mg total) by mouth every 4 (four) hours as needed for mild pain (or temp > 37.5 C (99.5 F)). 06/23/22   Angiulli, Lavon Paganini, PA-C  amiodarone (PACERONE) 200 MG tablet Take 1 tablet (200 mg total) by mouth daily. 06/23/22   Angiulli, Lavon Paganini, PA-C  amoxicillin (AMOXIL) 250 MG capsule Take 1 capsule (250 mg total) by mouth every 8 (eight) hours. 06/23/22   Angiulli, Lavon Paganini, PA-C  apixaban (ELIQUIS) 5 MG TABS tablet Take 1 tablet (5 mg total) by mouth 2 (two) times daily. 06/23/22   Angiulli, Lavon Paganini, PA-C  aspirin 81 MG chewable tablet Chew 81 mg by mouth daily.    [provider]  atorvastatin (LIPITOR) 40 MG tablet Take 1 tablet (40 mg total) by mouth at bedtime. 06/23/22   Angiulli, Lavon Paganini, PA-C  escitalopram (LEXAPRO) 10 MG tablet Take 1 tablet (10 mg total) by mouth at bedtime. 06/23/22   Angiulli, Lavon Paganini, PA-C  insulin glargine (LANTUS) 100 UNIT/ML Solostar Pen Inject 10 Units into the skin at bedtime. 06/23/22   Angiulli, Lavon Paganini, PA-C  Insulin Pen Needle 32G X 4 MM MISC Use  with Basaglar 06/23/22   Angiulli, Lavon Paganini, PA-C  levothyroxine (SYNTHROID) 50 MCG tablet Take 1 tablet (50 mcg total) by mouth daily before breakfast. 06/23/22   Angiulli, Lavon Paganini, PA-C  Multiple Vitamin (MULTIVITAMIN WITH MINERALS) TABS tablet Take 1 tablet by mouth daily. 06/24/22   Angiulli, Lavon Paganini, PA-C  nitroGLYCERIN (NITROSTAT) 0.4 MG SL tablet Place 1 tablet (0.4 mg total) under the tongue every 5 (five) minutes as needed for chest pain. 06/23/22   Angiulli, Lavon Paganini, PA-C  omeprazole (PRILOSEC) 40 MG capsule Take 1 capsule (40 mg total) by mouth 2 (two) times daily. 06/23/22   Angiulli, Lavon Paganini, PA-C  polyethylene glycol (MIRALAX / GLYCOLAX) 17 g packet Take 17 g by mouth daily. 06/24/22   Angiulli, Lavon Paganini, PA-C  pregabalin (LYRICA) 25 MG capsule Take 1 capsule (25 mg total) by mouth 2 (two) times daily. 06/23/22   Angiulli, Lavon Paganini, PA-C  tamsulosin (FLOMAX) 0.4 MG CAPS capsule Take 1 capsule (0.4 mg total) by mouth daily after supper. 06/23/22   Angiulli, Lavon Paganini, PA-C      Allergies    Food and Zestril [lisinopril]    Review of Systems   Review of Systems  All other systems reviewed and are negative.   Physical Exam Updated Vital Signs BP (!) 144/83   Pulse 75   Temp 97.7  F (36.5 C) (Oral)   Resp 20   SpO2 96%  Physical Exam Vitals and nursing note reviewed.  Constitutional:      General: He is not in acute distress.    Appearance: Normal appearance.  HENT:     Head: Normocephalic and atraumatic.  Eyes:     General:        Right eye: No discharge.        Left eye: No discharge.  Cardiovascular:     Comments: Regular rate and rhythm.  S1/S2 are distinct without any evidence of murmur, rubs, or gallops.  Radial pulses are 2+ bilaterally.  Dorsalis pedis pulses are 2+ bilaterally.  No evidence of pedal edema. Pulmonary:     Comments: Clear to auscultation bilaterally.  Normal effort.  No respiratory distress.  No evidence of wheezes, rales, or rhonchi heard  throughout. Abdominal:     General: Abdomen is flat. Bowel sounds are normal. There is no distension.     Tenderness: There is no abdominal tenderness. There is no guarding or rebound.  Musculoskeletal:        General: Normal range of motion.     Cervical back: Neck supple.     Comments: Mild swelling to the left lower extremity.  2+ dorsalis pedis pulses felt bilaterally.  Left leg is tender to palpation.  There is scabbed over wounds of the left anterior shin in different stages of healing.  Minimal surrounding erythema.  Left elbow is swollen and slightly erythematous.  No tenderness or warmth is felt.  This consistent with olecranon bursitis.  Skin:    General: Skin is warm and dry.     Findings: No rash.  Neurological:     General: No focal deficit present.     Mental Status: He is alert.  Psychiatric:        Mood and Affect: Mood normal.        Behavior: Behavior normal.     ED Results / Procedures / Treatments   Labs (all labs ordered are listed, but only abnormal results are displayed) Labs Reviewed - No data to display  EKG None  Radiology DG Ankle Complete Left  Result Date: 07/08/2022 CLINICAL DATA:  Pt had stroke about a month ago, has recovered, went home. Has been taking home health PT. Pt's left leg has been swollen since discharge. Pt states it is not as swollen but PT saw him today and his left ankle is more swollen, and painful which is new. left ankle pain EXAM: LEFT ANKLE COMPLETE - 3+ VIEW COMPARISON:  None Available. FINDINGS: Ankle mortise intact. The talar dome is normal. No malleolar fracture. The calcaneus is normal. IMPRESSION: No fracture or dislocation. Electronically Signed   By: Genevive Bi M.D.   On: 07/08/2022 16:58   US Venous Img Lower Unilateral Left  Result Date: 07/08/2022 CLINICAL DATA:  Pain and swelling x1 month EXAM: LEFT LOWER EXTREMITY VENOUS DOPPLER ULTRASOUND TECHNIQUE: Gray-scale sonography with compression, as well as color and  duplex ultrasound, were performed to evaluate the deep venous system(s) from the level of the common femoral vein through the popliteal and proximal calf veins. COMPARISON:  None Available. FINDINGS: VENOUS Normal compressibility of the common femoral, superficial femoral, and popliteal veins, as well as the visualized calf veins. Visualized portions of profunda femoral vein and great saphenous vein unremarkable. No filling defects to suggest DVT on grayscale or color Doppler imaging. Doppler waveforms show normal direction of venous flow, normal respiratory plasticity and  response to augmentation. Limited views of the contralateral common femoral vein are unremarkable. OTHER Subcutaneous edema in the calf Limitations: none IMPRESSION: No femoropopliteal DVT nor evidence of DVT within the visualized calf veins. If clinical symptoms are inconsistent or if there are persistent or worsening symptoms, further imaging (possibly involving the iliac veins) may be warranted. Electronically Signed   By: Lucrezia Europe M.D.   On: 07/08/2022 16:37    Procedures Procedures    Medications Ordered in ED Medications - No data to display  ED Course/ Medical Decision Making/ A&P Clinical Course as of 07/08/22 1900  Thu Jul 08, 2022  1712 DG Ankle Complete Left I personally ordered and interpreted the study and do not see any evidence of fracture. [CF]  0981 US Venous Img Lower Unilateral Left I personally ordered and interpreted the study and do not see any evidence of DVT.  I do agree with the radiologist interpretation. [CF]    Clinical Course User Index [CF] Hendricks Limes, PA-C   {   Click here for ABCD2, HEART and other calculators                          Medical Decision Making Donatello Junior Lust is a 76 y.o. male patient who presents to the emergency department today for further evaluation of left lower leg pain and left elbow pain.  With regard to the left elbow pain there is evidence of olecranon  bursitis.  Area is not warm and I have a low suspicion for septic arthritis at this time.  With regards to his left leg I will get an ultrasound to evaluate for DVT.  Patient's vital signs are normal at this time and is in no acute distress.  As highlighted in the ED course, there are no clinical signs of DVT or fracture of the left ankle.  Olecranon bursitis treat conservatively.  [facility]  Tylenol for pain.  Strict return precautions were discussed.  He is safe for discharge.  Amount and/or Complexity of Data Reviewed Radiology: ordered. Decision-making details documented in ED Course.    Final Clinical Impression(s) / ED Diagnoses Final diagnoses:  Left leg pain  Olecranon bursitis of left elbow    Rx / DC Orders ED Discharge Orders     None         Hendricks Limes, [place] 07/08/22 1900    Blanchie Dessert, MD 07/08/22 2248

## 2022-07-08 NOTE — Discharge Instructions (Signed)
You may use a compression stocking over the left leg.  If you are going to wrap the wounds do a loose fitting wrap to allow the compression stocking to do its job.  He can take Tylenol as needed for pain.  The swelling of your elbow is called bursitis.  I given you a handout which you can read.  This should resolve on its own.  Please follow-up with your primary care doctor for further evaluation.  You may return to the emergency department for any worsening symptoms that you might have.

## 2022-07-08 NOTE — ED Triage Notes (Signed)
Pt had stroke about a month ago, has recovered, went home. Has been taking home health PT. Pt's left leg has been swollen since discharge. Pt states it is not as swollen but PT saw him today and his left ankle is more swollen, and painful which is new.

## 2022-07-23 ENCOUNTER — Other Ambulatory Visit: Payer: Self-pay

## 2022-07-23 ENCOUNTER — Encounter (HOSPITAL_BASED_OUTPATIENT_CLINIC_OR_DEPARTMENT_OTHER): Payer: Self-pay

## 2022-07-23 ENCOUNTER — Emergency Department (HOSPITAL_BASED_OUTPATIENT_CLINIC_OR_DEPARTMENT_OTHER)
Admission: EM | Admit: 2022-07-23 | Discharge: 2022-07-23 | Disposition: A | Discharge status: Home / Self Care | Payer: No Typology Code available for payment source | Attending: Emergency Medicine | Admitting: Emergency Medicine

## 2022-07-23 ENCOUNTER — Emergency Department (HOSPITAL_BASED_OUTPATIENT_CLINIC_OR_DEPARTMENT_OTHER): Payer: No Typology Code available for payment source

## 2022-07-23 DIAGNOSIS — M25572 Pain in left ankle and joints of left foot: Secondary | ICD-10-CM | POA: Diagnosis not present

## 2022-07-23 DIAGNOSIS — Z794 Long term (current) use of insulin: Secondary | ICD-10-CM | POA: Diagnosis not present

## 2022-07-23 DIAGNOSIS — E119 Type 2 diabetes mellitus without complications: Secondary | ICD-10-CM | POA: Diagnosis not present

## 2022-07-23 DIAGNOSIS — Z7901 Long term (current) use of anticoagulants: Secondary | ICD-10-CM | POA: Diagnosis not present

## 2022-07-23 DIAGNOSIS — I10 Essential (primary) hypertension: Secondary | ICD-10-CM | POA: Diagnosis not present

## 2022-07-23 DIAGNOSIS — Z7982 Long term (current) use of aspirin: Secondary | ICD-10-CM | POA: Insufficient documentation

## 2022-07-23 DIAGNOSIS — X509XXA Other and unspecified overexertion or strenuous movements or postures, initial encounter: Secondary | ICD-10-CM | POA: Insufficient documentation

## 2022-07-23 NOTE — ED Provider Notes (Signed)
[facility] Provider Note   CSN: [id number] Arrival date & time: 07/23/22  1843     History  Chief Complaint  Patient presents with   Ankle Pain    Grant [name] is a 77 y.o. male.  Patient presents the emergency department complaining of left-sided ankle pain.  Patient states that approximate 1 month ago he rolled his left ankle, hearing an audible pop as he did so.  He was evaluated today at the [facility] who stated he had an ankle fracture and recommended he come to the emergency department for treatment.  Past medical history is significant for hypertension, paroxysmal A-fib, diet-controlled diabetes mellitus, osteoarthritis, left-sided weakness secondary to acute CVA, implantable cardioverter defibrillator  HPI     Home Medications Prior to Admission medications   Medication Sig Start Date End Date Taking? Authorizing Provider  acetaminophen (TYLENOL) 325 MG tablet Take 2 tablets (650 mg total) by mouth every 4 (four) hours as needed for mild pain (or temp > 37.5 C (99.5 F)). 06/23/22   Angiulli, Lavon Paganini, PA-C  amiodarone (PACERONE) 200 MG tablet Take 1 tablet (200 mg total) by mouth daily. 06/23/22   Angiulli, Lavon Paganini, PA-C  amoxicillin (AMOXIL) 250 MG capsule Take 1 capsule (250 mg total) by mouth every 8 (eight) hours. 06/23/22   Angiulli, Lavon Paganini, PA-C  apixaban (ELIQUIS) 5 MG TABS tablet Take 1 tablet (5 mg total) by mouth 2 (two) times daily. 06/23/22   Angiulli, Lavon Paganini, PA-C  aspirin 81 MG chewable tablet Chew 81 mg by mouth daily.    [provider]  atorvastatin (LIPITOR) 40 MG tablet Take 1 tablet (40 mg total) by mouth at bedtime. 06/23/22   Angiulli, Lavon Paganini, PA-C  escitalopram (LEXAPRO) 10 MG tablet Take 1 tablet (10 mg total) by mouth at bedtime. 06/23/22   Angiulli, Lavon Paganini, PA-C  insulin glargine (LANTUS) 100 UNIT/ML Solostar Pen Inject 10 Units into the skin at bedtime. 06/23/22   Angiulli, Lavon Paganini, PA-C  Insulin Pen  Needle 32G X 4 MM MISC Use with Basaglar 06/23/22   Angiulli, Lavon Paganini, PA-C  levothyroxine (SYNTHROID) 50 MCG tablet Take 1 tablet (50 mcg total) by mouth daily before breakfast. 06/23/22   Angiulli, Lavon Paganini, PA-C  Multiple Vitamin (MULTIVITAMIN WITH MINERALS) TABS tablet Take 1 tablet by mouth daily. 06/24/22   Angiulli, Lavon Paganini, PA-C  nitroGLYCERIN (NITROSTAT) 0.4 MG SL tablet Place 1 tablet (0.4 mg total) under the tongue every 5 (five) minutes as needed for chest pain. 06/23/22   Angiulli, Lavon Paganini, PA-C  omeprazole (PRILOSEC) 40 MG capsule Take 1 capsule (40 mg total) by mouth 2 (two) times daily. 06/23/22   Angiulli, Lavon Paganini, PA-C  polyethylene glycol (MIRALAX / GLYCOLAX) 17 g packet Take 17 g by mouth daily. 06/24/22   Angiulli, Lavon Paganini, PA-C  pregabalin (LYRICA) 25 MG capsule Take 1 capsule (25 mg total) by mouth 2 (two) times daily. 06/23/22   Angiulli, Lavon Paganini, PA-C  tamsulosin (FLOMAX) 0.4 MG CAPS capsule Take 1 capsule (0.4 mg total) by mouth daily after supper. 06/23/22   Angiulli, Lavon Paganini, PA-C      Allergies    Food and Zestril [lisinopril]    Review of Systems   Review of Systems  Musculoskeletal:  Positive for arthralgias.    Physical Exam Updated Vital Signs BP 130/71 (BP Location: Left Arm)   Pulse 85   Temp 97.7 F (36.5 C)   Resp 18  SpO2 99%  Physical Exam Vitals and nursing note reviewed.  Constitutional:      General: He is not in acute distress.    Appearance: He is well-developed.  HENT:     Head: Normocephalic and atraumatic.  Eyes:     Conjunctiva/sclera: Conjunctivae normal.  Cardiovascular:     Rate and Rhythm: Normal rate and regular rhythm.     Heart sounds: No murmur heard. Pulmonary:     Effort: Pulmonary effort is normal. No respiratory distress.     Breath sounds: Normal breath sounds.  Abdominal:     Palpations: Abdomen is soft.     Tenderness: There is no abdominal tenderness.  Musculoskeletal:        General: Tenderness (Mild tenderness over  the left lateral malleolus) present. No swelling or deformity.     Cervical back: Neck supple.  Skin:    General: Skin is warm and dry.     Capillary Refill: Capillary refill takes less than 2 seconds.  Neurological:     Mental Status: He is alert.  Psychiatric:        Mood and Affect: Mood normal.     ED Results / Procedures / Treatments   Labs (all labs ordered are listed, but only abnormal results are displayed) Labs Reviewed - No data to display  EKG None  Radiology DG Ankle Complete Left  Result Date: 07/23/2022 CLINICAL DATA:  pain EXAM: LEFT ANKLE COMPLETE - 3+ VIEW COMPARISON:  X-ray left ankle 07/08/2022 FINDINGS: There is no evidence of fracture, dislocation, or joint effusion. Poorly visualized irregularity of the base and head of the first digit metatarsal. Soft tissues are unremarkable. IMPRESSION: 1. No acute displaced fracture or dislocation. 2. Poorly visualized irregularity of the base and head of the first digit metatarsal. Query arthropathy. Consider dedicated foot radiograph if clinically indicated. Electronically Signed   By: Iven Finn M.D.   On: 07/23/2022 20:19    Procedures .Ortho Injury Treatment  Date/Time: 07/23/2022 9:13 PM  Performed by: Dorothyann Peng, PA-C Authorized by: Dorothyann Peng, PA-C   Consent:    Consent obtained:  Verbal   Consent given by:  Patient   Risks discussed:  Nerve damage, restricted joint movement, vascular damage and stiffness   Alternatives discussed:  No treatmentInjury location: ankle Location details: left ankle Injury type: fracture Pre-procedure neurovascular assessment: neurovascularly intact Immobilization: Cam boot. Splint Applied by: ED Tech Post-procedure neurovascular assessment: post-procedure neurovascularly intact       Medications Ordered in ED Medications - No data to display  ED Course/ Medical Decision Making/ A&P                             Medical Decision Making Amount and/or  Complexity of Data Reviewed Radiology: ordered.   Patient presents with chief command of left-sided ankle pain.  Differential diagnosis includes fracture, dislocation, sprain, and others  Comorbidities include osteoarthritis, left-sided weakness due to CVA  I reviewed the patient's past medical history including notes from the [facility] from earlier today.  [facility] possible distal left fibula fracture.  Minimal displacement.  I ordered and reviewed imaging here at the emergency room including plain films of the left ankle.  Radiologist reports no fracture.  I do see a potential small oblique fracture through the distal fibula.  Very hard to see.  No displacement.  I can see with the radiologist from the [facility] may have seen  earlier today.  Unclear whether this is truly a fracture or not.  At minimum the patient has an ankle sprain.  Plan to place patient in cam walker boot and have patient follow-up with orthopedics.  Patient has a walker at home and would prefer to use this over crutches due to weakness from the stroke.  Patient is unable to take NSAIDs due to kidney function.  Plan to have patient take extra strength Tylenol for pain control.        Final Clinical Impression(s) / ED Diagnoses Final diagnoses:  Acute left ankle pain    Rx / DC Orders ED Discharge Orders     None         Ronny Bacon 07/23/22 2118    Cristie Hem, MD 07/24/22 [phone]

## 2022-07-23 NOTE — Discharge Instructions (Signed)
You were evaluated today for left-sided ankle pain.  X-rays from the [place] [facility] showed a possible distal fibula fracture.  X-rays here were not definitive.  You have been placed in a cam walker boot to protect the ankle.  Please use a walker at home to reduce weightbearing of the left lower extremity.  Please call orthopedic surgery for a follow-up appointment.  You may take up to 1000 mg of Tylenol every 6 hours.  Do not exceed 4000 mg of Tylenol (acetaminophen) in any 24-hour period

## 2022-07-23 NOTE — ED Triage Notes (Signed)
Pt presents POV from home, seen at [facility] today.    Pt reports Left ankle pain x2 weeks. Pt seen here a few weeks ago for the same. [facility] and [facility] Left [facility]. They advised him to go to the ED to have it "taken care of"   Pt BIB WC, NAD

## 2022-07-27 ENCOUNTER — Encounter: Payer: Self-pay | Admitting: Neurology

## 2022-07-27 ENCOUNTER — Inpatient Hospital Stay: Payer: Medicare (Managed Care) | Admitting: Neurology

## 2022-09-20 ENCOUNTER — Other Ambulatory Visit: Payer: Self-pay

## 2022-09-20 ENCOUNTER — Inpatient Hospital Stay (HOSPITAL_COMMUNITY)
Admission: EM | Admit: 2022-09-20 | Discharge: 2022-09-26 | DRG: 291 | Disposition: A | Discharge status: Home Health | Payer: No Typology Code available for payment source | Attending: Internal Medicine | Admitting: Internal Medicine

## 2022-09-20 ENCOUNTER — Emergency Department (HOSPITAL_COMMUNITY): Payer: No Typology Code available for payment source

## 2022-09-20 ENCOUNTER — Encounter (HOSPITAL_COMMUNITY): Payer: Self-pay

## 2022-09-20 DIAGNOSIS — Z79899 Other long term (current) drug therapy: Secondary | ICD-10-CM

## 2022-09-20 DIAGNOSIS — I509 Heart failure, unspecified: Secondary | ICD-10-CM

## 2022-09-20 DIAGNOSIS — Z974 Presence of external hearing-aid: Secondary | ICD-10-CM

## 2022-09-20 DIAGNOSIS — F331 Major depressive disorder, recurrent, moderate: Secondary | ICD-10-CM | POA: Diagnosis present

## 2022-09-20 DIAGNOSIS — I130 Hypertensive heart and chronic kidney disease with heart failure and stage 1 through stage 4 chronic kidney disease, or unspecified chronic kidney disease: Secondary | ICD-10-CM | POA: Diagnosis not present

## 2022-09-20 DIAGNOSIS — C220 Liver cell carcinoma: Secondary | ICD-10-CM | POA: Diagnosis present

## 2022-09-20 DIAGNOSIS — N1831 Chronic kidney disease, stage 3a: Secondary | ICD-10-CM | POA: Diagnosis present

## 2022-09-20 DIAGNOSIS — E876 Hypokalemia: Secondary | ICD-10-CM | POA: Diagnosis present

## 2022-09-20 DIAGNOSIS — Z720 Tobacco use: Secondary | ICD-10-CM | POA: Diagnosis present

## 2022-09-20 DIAGNOSIS — I639 Cerebral infarction, unspecified: Secondary | ICD-10-CM | POA: Diagnosis present

## 2022-09-20 DIAGNOSIS — K7030 Alcoholic cirrhosis of liver without ascites: Secondary | ICD-10-CM | POA: Diagnosis present

## 2022-09-20 DIAGNOSIS — J449 Chronic obstructive pulmonary disease, unspecified: Secondary | ICD-10-CM | POA: Diagnosis present

## 2022-09-20 DIAGNOSIS — Z8505 Personal history of malignant neoplasm of liver: Secondary | ICD-10-CM

## 2022-09-20 DIAGNOSIS — Z794 Long term (current) use of insulin: Secondary | ICD-10-CM

## 2022-09-20 DIAGNOSIS — J9601 Acute respiratory failure with hypoxia: Secondary | ICD-10-CM | POA: Diagnosis present

## 2022-09-20 DIAGNOSIS — E039 Hypothyroidism, unspecified: Secondary | ICD-10-CM | POA: Diagnosis present

## 2022-09-20 DIAGNOSIS — I63511 Cerebral infarction due to unspecified occlusion or stenosis of right middle cerebral artery: Secondary | ICD-10-CM | POA: Diagnosis present

## 2022-09-20 DIAGNOSIS — I5031 Acute diastolic (congestive) heart failure: Secondary | ICD-10-CM

## 2022-09-20 DIAGNOSIS — I2510 Atherosclerotic heart disease of native coronary artery without angina pectoris: Secondary | ICD-10-CM | POA: Diagnosis present

## 2022-09-20 DIAGNOSIS — Z8249 Family history of ischemic heart disease and other diseases of the circulatory system: Secondary | ICD-10-CM

## 2022-09-20 DIAGNOSIS — Z7982 Long term (current) use of aspirin: Secondary | ICD-10-CM

## 2022-09-20 DIAGNOSIS — B182 Chronic viral hepatitis C: Secondary | ICD-10-CM | POA: Insufficient documentation

## 2022-09-20 DIAGNOSIS — Z95810 Presence of automatic (implantable) cardiac defibrillator: Secondary | ICD-10-CM

## 2022-09-20 DIAGNOSIS — C228 Malignant neoplasm of liver, primary, unspecified as to type: Secondary | ICD-10-CM | POA: Insufficient documentation

## 2022-09-20 DIAGNOSIS — F17210 Nicotine dependence, cigarettes, uncomplicated: Secondary | ICD-10-CM | POA: Diagnosis present

## 2022-09-20 DIAGNOSIS — Z86718 Personal history of other venous thrombosis and embolism: Secondary | ICD-10-CM

## 2022-09-20 DIAGNOSIS — G4733 Obstructive sleep apnea (adult) (pediatric): Secondary | ICD-10-CM | POA: Diagnosis present

## 2022-09-20 DIAGNOSIS — Z7901 Long term (current) use of anticoagulants: Secondary | ICD-10-CM

## 2022-09-20 DIAGNOSIS — E785 Hyperlipidemia, unspecified: Secondary | ICD-10-CM | POA: Diagnosis present

## 2022-09-20 DIAGNOSIS — Z91018 Allergy to other foods: Secondary | ICD-10-CM

## 2022-09-20 DIAGNOSIS — Z79890 Hormone replacement therapy (postmenopausal): Secondary | ICD-10-CM

## 2022-09-20 DIAGNOSIS — E119 Type 2 diabetes mellitus without complications: Secondary | ICD-10-CM

## 2022-09-20 DIAGNOSIS — Z888 Allergy status to other drugs, medicaments and biological substances status: Secondary | ICD-10-CM

## 2022-09-20 DIAGNOSIS — I10 Essential (primary) hypertension: Secondary | ICD-10-CM | POA: Diagnosis present

## 2022-09-20 DIAGNOSIS — Z716 Tobacco abuse counseling: Secondary | ICD-10-CM

## 2022-09-20 DIAGNOSIS — Z955 Presence of coronary angioplasty implant and graft: Secondary | ICD-10-CM

## 2022-09-20 DIAGNOSIS — Z8673 Personal history of transient ischemic attack (TIA), and cerebral infarction without residual deficits: Secondary | ICD-10-CM

## 2022-09-20 DIAGNOSIS — E1122 Type 2 diabetes mellitus with diabetic chronic kidney disease: Secondary | ICD-10-CM | POA: Diagnosis present

## 2022-09-20 DIAGNOSIS — I255 Ischemic cardiomyopathy: Secondary | ICD-10-CM | POA: Diagnosis present

## 2022-09-20 DIAGNOSIS — K219 Gastro-esophageal reflux disease without esophagitis: Secondary | ICD-10-CM | POA: Diagnosis present

## 2022-09-20 DIAGNOSIS — N400 Enlarged prostate without lower urinary tract symptoms: Secondary | ICD-10-CM | POA: Diagnosis present

## 2022-09-20 DIAGNOSIS — I5020 Unspecified systolic (congestive) heart failure: Principal | ICD-10-CM

## 2022-09-20 DIAGNOSIS — I482 Chronic atrial fibrillation, unspecified: Secondary | ICD-10-CM | POA: Diagnosis present

## 2022-09-20 DIAGNOSIS — R918 Other nonspecific abnormal finding of lung field: Secondary | ICD-10-CM | POA: Insufficient documentation

## 2022-09-20 DIAGNOSIS — H903 Sensorineural hearing loss, bilateral: Secondary | ICD-10-CM | POA: Diagnosis present

## 2022-09-20 DIAGNOSIS — J309 Allergic rhinitis, unspecified: Secondary | ICD-10-CM | POA: Insufficient documentation

## 2022-09-20 DIAGNOSIS — I5023 Acute on chronic systolic (congestive) heart failure: Secondary | ICD-10-CM | POA: Diagnosis present

## 2022-09-20 LAB — CBC WITH DIFFERENTIAL/PLATELET
Abs Immature Granulocytes: 0.02 10*3/uL (ref 0.00–0.07)
Basophils Absolute: 0 10*3/uL (ref 0.0–0.1)
Basophils Relative: 1 %
Eosinophils Absolute: 0.1 10*3/uL (ref 0.0–0.5)
Eosinophils Relative: 1 %
HCT: 36.4 % — ABNORMAL LOW (ref 39.0–52.0)
Hemoglobin: 11.1 g/dL — ABNORMAL LOW (ref 13.0–17.0)
Immature Granulocytes: 0 %
Lymphocytes Relative: 20 %
Lymphs Abs: 1.4 10*3/uL (ref 0.7–4.0)
MCH: 26.9 pg (ref 26.0–34.0)
MCHC: 30.5 g/dL (ref 30.0–36.0)
MCV: 88.1 fL (ref 80.0–100.0)
Monocytes Absolute: 0.7 10*3/uL (ref 0.1–1.0)
Monocytes Relative: 10 %
Neutro Abs: 4.8 10*3/uL (ref 1.7–7.7)
Neutrophils Relative %: 68 %
Platelets: 217 10*3/uL (ref 150–400)
RBC: 4.13 MIL/uL — ABNORMAL LOW (ref 4.22–5.81)
RDW: 14.1 % (ref 11.5–15.5)
WBC: 7.1 10*3/uL (ref 4.0–10.5)
nRBC: 0 % (ref 0.0–0.2)

## 2022-09-20 LAB — COMPREHENSIVE METABOLIC PANEL
ALT: 14 U/L (ref 0–44)
AST: 20 U/L (ref 15–41)
Albumin: 3.7 g/dL (ref 3.5–5.0)
Alkaline Phosphatase: 48 U/L (ref 38–126)
Anion gap: 8 (ref 5–15)
BUN: 13 mg/dL (ref 8–23)
CO2: 22 mmol/L (ref 22–32)
Calcium: 8.3 mg/dL — ABNORMAL LOW (ref 8.9–10.3)
Chloride: 106 mmol/L (ref 98–111)
Creatinine, Ser: 1.06 mg/dL (ref 0.61–1.24)
GFR, Estimated: >60 mL/min (ref >60)
Glucose, Bld: 136 mg/dL — ABNORMAL HIGH (ref 70–99)
Potassium: 3.9 mmol/L (ref 3.5–5.1)
Sodium: 136 mmol/L (ref 135–145)
Total Bilirubin: 0.5 mg/dL (ref 0.3–1.2)
Total Protein: 6.5 g/dL (ref 6.5–8.1)

## 2022-09-20 LAB — GLUCOSE, CAPILLARY
Glucose-Capillary: 136 mg/dL — ABNORMAL HIGH (ref 70–99)
Glucose-Capillary: 188 mg/dL — ABNORMAL HIGH (ref 70–99)

## 2022-09-20 LAB — BRAIN NATRIURETIC PEPTIDE: B Natriuretic Peptide: 971.2 pg/mL — ABNORMAL HIGH (ref 0.0–100.0)

## 2022-09-20 MED ORDER — LOSARTAN POTASSIUM 25 MG PO TABS
25.0000 mg | ORAL_TABLET | Freq: Every day | ORAL | Status: DC
  Administered 2022-09-20 – 2022-09-26 (×7): 25 mg via ORAL
  Filled 2022-09-20 (×7): qty 1

## 2022-09-20 MED ORDER — LEVOTHYROXINE SODIUM 50 MCG PO TABS
50.0000 ug | ORAL_TABLET | Freq: Every day | ORAL | Status: DC
  Administered 2022-09-21 – 2022-09-26 (×6): 50 ug via ORAL
  Filled 2022-09-20 (×6): qty 1

## 2022-09-20 MED ORDER — SODIUM CHLORIDE 0.9% FLUSH: 3.0000 mL | INTRAVENOUS | Status: DC | PRN

## 2022-09-20 MED ORDER — NITROGLYCERIN 0.4 MG SL SUBL: 0.4000 mg | SUBLINGUAL_TABLET | SUBLINGUAL | Status: DC | PRN

## 2022-09-20 MED ORDER — ASPIRIN 81 MG PO CHEW
81.0000 mg | CHEWABLE_TABLET | Freq: Every day | ORAL | Status: DC
  Administered 2022-09-20 – 2022-09-22 (×3): 81 mg via ORAL
  Filled 2022-09-20 (×3): qty 1

## 2022-09-20 MED ORDER — FUROSEMIDE 10 MG/ML IJ SOLN
40.0000 mg | Freq: Once | INTRAMUSCULAR | Status: AC
  Administered 2022-09-20: 40 mg via INTRAVENOUS
  Filled 2022-09-20: qty 4

## 2022-09-20 MED ORDER — APIXABAN 5 MG PO TABS
5.0000 mg | ORAL_TABLET | Freq: Two times a day (BID) | ORAL | Status: DC
  Administered 2022-09-20 – 2022-09-26 (×12): 5 mg via ORAL
  Filled 2022-09-20 (×12): qty 1

## 2022-09-20 MED ORDER — ACETAMINOPHEN 325 MG PO TABS: 650.0000 mg | ORAL_TABLET | ORAL | Status: DC | PRN

## 2022-09-20 MED ORDER — HYDROCODONE-ACETAMINOPHEN 5-325 MG PO TABS: 1.0000 | ORAL_TABLET | ORAL | Status: DC | PRN

## 2022-09-20 MED ORDER — INSULIN ASPART 100 UNIT/ML IJ SOLN
0.0000 [IU] | Freq: Three times a day (TID) | INTRAMUSCULAR | Status: DC
  Administered 2022-09-20 – 2022-09-21 (×2): 2 [IU] via SUBCUTANEOUS
  Administered 2022-09-21: 3 [IU] via SUBCUTANEOUS
  Administered 2022-09-21 – 2022-09-22 (×2): 2 [IU] via SUBCUTANEOUS
  Administered 2022-09-22: 3 [IU] via SUBCUTANEOUS
  Administered 2022-09-23 – 2022-09-26 (×6): 2 [IU] via SUBCUTANEOUS

## 2022-09-20 MED ORDER — POLYETHYLENE GLYCOL 3350 17 G PO PACK
17.0000 g | PACK | Freq: Every day | ORAL | Status: DC | PRN
  Administered 2022-09-26: 17 g via ORAL
  Filled 2022-09-20: qty 1

## 2022-09-20 MED ORDER — INSULIN GLARGINE-YFGN 100 UNIT/ML ~~LOC~~ SOLN
10.0000 [IU] | Freq: Every day | SUBCUTANEOUS | Status: DC
  Administered 2022-09-20 – 2022-09-25 (×6): 10 [IU] via SUBCUTANEOUS
  Filled 2022-09-20 (×7): qty 0.1

## 2022-09-20 MED ORDER — PREGABALIN 25 MG PO CAPS
25.0000 mg | ORAL_CAPSULE | Freq: Two times a day (BID) | ORAL | Status: DC
  Administered 2022-09-20 – 2022-09-22 (×4): 25 mg via ORAL
  Filled 2022-09-20 (×4): qty 1

## 2022-09-20 MED ORDER — ALBUTEROL SULFATE (2.5 MG/3ML) 0.083% IN NEBU: 2.5000 mg | INHALATION_SOLUTION | RESPIRATORY_TRACT | Status: DC | PRN

## 2022-09-20 MED ORDER — AMIODARONE HCL 200 MG PO TABS
200.0000 mg | ORAL_TABLET | Freq: Every day | ORAL | Status: DC
  Administered 2022-09-20: 200 mg via ORAL
  Filled 2022-09-20 (×2): qty 1

## 2022-09-20 MED ORDER — FUROSEMIDE 10 MG/ML IJ SOLN
60.0000 mg | Freq: Two times a day (BID) | INTRAMUSCULAR | Status: DC
  Filled 2022-09-20: qty 6

## 2022-09-20 MED ORDER — FUROSEMIDE 10 MG/ML IJ SOLN
60.0000 mg | Freq: Two times a day (BID) | INTRAMUSCULAR | Status: DC
  Administered 2022-09-21 – 2022-09-22 (×4): 60 mg via INTRAVENOUS
  Filled 2022-09-20 (×4): qty 6

## 2022-09-20 MED ORDER — SODIUM CHLORIDE 0.9 % IV SOLN: 250.0000 mL | INTRAVENOUS | Status: DC | PRN

## 2022-09-20 MED ORDER — METOPROLOL TARTRATE 5 MG/5ML IV SOLN: 5.0000 mg | Freq: Four times a day (QID) | INTRAVENOUS | Status: DC | PRN

## 2022-09-20 MED ORDER — TAMSULOSIN HCL 0.4 MG PO CAPS
0.4000 mg | ORAL_CAPSULE | Freq: Every day | ORAL | Status: DC
  Administered 2022-09-20 – 2022-09-26 (×7): 0.4 mg via ORAL
  Filled 2022-09-20 (×7): qty 1

## 2022-09-20 MED ORDER — ATORVASTATIN CALCIUM 40 MG PO TABS
40.0000 mg | ORAL_TABLET | Freq: Every day | ORAL | Status: DC
  Administered 2022-09-20 – 2022-09-25 (×6): 40 mg via ORAL
  Filled 2022-09-20 (×6): qty 1

## 2022-09-20 MED ORDER — ESCITALOPRAM OXALATE 10 MG PO TABS
10.0000 mg | ORAL_TABLET | Freq: Every day | ORAL | Status: DC
  Administered 2022-09-20 – 2022-09-25 (×6): 10 mg via ORAL
  Filled 2022-09-20 (×6): qty 1

## 2022-09-20 MED ORDER — SODIUM CHLORIDE 0.9% FLUSH
3.0000 mL | Freq: Two times a day (BID) | INTRAVENOUS | Status: DC
  Administered 2022-09-20 – 2022-09-26 (×9): 3 mL via INTRAVENOUS

## 2022-09-20 MED ORDER — FINASTERIDE 5 MG PO TABS
5.0000 mg | ORAL_TABLET | Freq: Every day | ORAL | Status: DC
  Administered 2022-09-20 – 2022-09-26 (×7): 5 mg via ORAL
  Filled 2022-09-20 (×8): qty 1

## 2022-09-20 MED ORDER — PANTOPRAZOLE SODIUM 40 MG PO TBEC
40.0000 mg | DELAYED_RELEASE_TABLET | Freq: Two times a day (BID) | ORAL | Status: DC
  Administered 2022-09-20 – 2022-09-26 (×12): 40 mg via ORAL
  Filled 2022-09-20 (×12): qty 1

## 2022-09-20 NOTE — Hospital Course (Signed)
Patient is a 77 year old with history of DVT, liver cancer (treatment at [facility] status post left liver lobectomy, radioembolization and cryoablation in August and September 2022) A-fib on chronic anticoagulation, right CVA, CHF, CAD with AICD, HLD, T2DM, HTN, tobacco use, CKD, hypothyroid use who presents to the ED with increasing shortness of breath times the last 3 weeks.  It got much worse yesterday and today.  The patient has a history of CHF and is supposed to be following a low-sodium diet as well as monitoring daily weights although he does not really follow this very strictly.  Additionally he has ongoing tobacco use.  Patient came to the ED today where he was noted to be mildly hypoxic with an elevated BNP of 971 and a chest x-ray that showed fluid overload as well as trace pleural effusions.  Patient's last echo was in 12 of 23 and showed a EF of 60 to 65%.  We are asked to admit for management of CHF.

## 2022-09-20 NOTE — Assessment & Plan Note (Addendum)
Transitioned to oral Lasix Weaned off supplemental oxygen today Daily weights

## 2022-09-20 NOTE — Plan of Care (Signed)
Patient guide provided. Call bell placed within reach along with personal belongings. Bed alarm set.

## 2022-09-20 NOTE — Assessment & Plan Note (Signed)
AICD, statin, NTG

## 2022-09-20 NOTE — Assessment & Plan Note (Addendum)
He was offered and declined nicotine patch.   Smoking cessation counseling completed

## 2022-09-20 NOTE — Assessment & Plan Note (Addendum)
Continue Semglee Patient been placed on Accu-Cheks before every meal and nightly with sliding scale insulin Diabetic Diet

## 2022-09-20 NOTE — Assessment & Plan Note (Addendum)
Continue PPI ?

## 2022-09-20 NOTE — Assessment & Plan Note (Addendum)
Continue Lexapro

## 2022-09-20 NOTE — Assessment & Plan Note (Signed)
Has AICD Continue Lipitor Nitroglycerin as needed

## 2022-09-20 NOTE — Assessment & Plan Note (Addendum)
Continue oral antihypertensive regimen Will prescribe new regimen at time of discharge

## 2022-09-20 NOTE — Assessment & Plan Note (Signed)
Continue finasteride Continue Flomax

## 2022-09-20 NOTE — ED Provider Notes (Signed)
[facility] Provider Note   CSN: [id number] Arrival date & time: 09/20/22  1205     History {Add pertinent medical, surgical, social history, OB history to HPI:1} Chief Complaint  Patient presents with   Shortness of Breath    Grant [name] is a 77 y.o. male.  Patient complains of shortness of breath.  Patient has history of coronary artery disease   Shortness of Breath      Home Medications Prior to Admission medications   Medication Sig Start Date End Date Taking? Authorizing Provider  acetaminophen (TYLENOL) 325 MG tablet Take 2 tablets (650 mg total) by mouth every 4 (four) hours as needed for mild pain (or temp > 37.5 C (99.5 F)). 06/23/22   Angiulli, Lavon Paganini, PA-C  amiodarone (PACERONE) 200 MG tablet Take 1 tablet (200 mg total) by mouth daily. 06/23/22   Angiulli, Lavon Paganini, PA-C  amoxicillin (AMOXIL) 250 MG capsule Take 1 capsule (250 mg total) by mouth every 8 (eight) hours. 06/23/22   Angiulli, Lavon Paganini, PA-C  apixaban (ELIQUIS) 5 MG TABS tablet Take 1 tablet (5 mg total) by mouth 2 (two) times daily. 06/23/22   Angiulli, Lavon Paganini, PA-C  aspirin 81 MG chewable tablet Chew 81 mg by mouth daily.    [provider]  atorvastatin (LIPITOR) 40 MG tablet Take 1 tablet (40 mg total) by mouth at bedtime. 06/23/22   Angiulli, Lavon Paganini, PA-C  escitalopram (LEXAPRO) 10 MG tablet Take 1 tablet (10 mg total) by mouth at bedtime. 06/23/22   Angiulli, Lavon Paganini, PA-C  insulin glargine (LANTUS) 100 UNIT/ML Solostar Pen Inject 10 Units into the skin at bedtime. 06/23/22   Angiulli, Lavon Paganini, PA-C  Insulin Pen Needle 32G X 4 MM MISC Use with Basaglar 06/23/22   Angiulli, Lavon Paganini, PA-C  levothyroxine (SYNTHROID) 50 MCG tablet Take 1 tablet (50 mcg total) by mouth daily before breakfast. 06/23/22   Angiulli, Lavon Paganini, PA-C  Multiple Vitamin (MULTIVITAMIN WITH MINERALS) TABS tablet Take 1 tablet by mouth daily. 06/24/22   Angiulli, Lavon Paganini, PA-C   nitroGLYCERIN (NITROSTAT) 0.4 MG SL tablet Place 1 tablet (0.4 mg total) under the tongue every 5 (five) minutes as needed for chest pain. 06/23/22   Angiulli, Lavon Paganini, PA-C  omeprazole (PRILOSEC) 40 MG capsule Take 1 capsule (40 mg total) by mouth 2 (two) times daily. 06/23/22   Angiulli, Lavon Paganini, PA-C  polyethylene glycol (MIRALAX / GLYCOLAX) 17 g packet Take 17 g by mouth daily. 06/24/22   Angiulli, Lavon Paganini, PA-C  pregabalin (LYRICA) 25 MG capsule Take 1 capsule (25 mg total) by mouth 2 (two) times daily. 06/23/22   Angiulli, Lavon Paganini, PA-C  tamsulosin (FLOMAX) 0.4 MG CAPS capsule Take 1 capsule (0.4 mg total) by mouth daily after supper. 06/23/22   Angiulli, Lavon Paganini, PA-C      Allergies    Food and Zestril [lisinopril]    Review of Systems   Review of Systems  Respiratory:  Positive for shortness of breath.     Physical Exam Updated Vital Signs BP (!) 149/99 (BP Location: Left Arm)   Pulse 91   Temp 98.9 F (37.2 C) (Oral)   Resp 20   Ht 6\' 2"  (1.88 m)   Wt 97.5 kg   SpO2 95%   BMI 27.60 kg/m  Physical Exam  ED Results / Procedures / Treatments   Labs (all labs ordered are listed, but only abnormal results are displayed)  Labs Reviewed  CBC WITH DIFFERENTIAL/PLATELET - Abnormal; Notable for the following components:      Result Value   RBC 4.13 (*)    Hemoglobin 11.1 (*)    HCT 36.4 (*)    All other components within normal limits  COMPREHENSIVE METABOLIC PANEL - Abnormal; Notable for the following components:   Glucose, Bld 136 (*)    Calcium 8.3 (*)    All other components within normal limits  BRAIN NATRIURETIC PEPTIDE - Abnormal; Notable for the following components:   B Natriuretic Peptide 971.2 (*)    All other components within normal limits    EKG None  Radiology DG Chest 2 View  Result Date: 09/20/2022 CLINICAL DATA:  Shortness of breath EXAM: CHEST - 2 VIEW COMPARISON:  06/14/2022 FINDINGS: Left-sided implanted cardiac device remains in place. Mild  cardiomegaly. Aortic atherosclerosis. Pulmonary vascular congestion. Mild interstitial prominence. No focal consolidation. Trace pleural effusions. No pneumothorax. IMPRESSION: Findings suggestive of mild CHF with interstitial edema and trace pleural effusions. Electronically Signed   By: Davina Poke D.O.   On: 09/20/2022 13:14    Procedures Procedures  {Document cardiac monitor, telemetry assessment procedure when appropriate:1}  Medications Ordered in ED Medications  furosemide (LASIX) injection 40 mg (has no administration in time range)    ED Course/ Medical Decision Making/ A&P   {   Click here for ABCD2, HEART and other calculatorsREFRESH Note before signing :1}                          Medical Decision Making Amount and/or Complexity of Data Reviewed Labs: ordered. Radiology: ordered.  Risk Prescription drug management. Decision regarding hospitalization.   Patient with mild to moderate congestive heart failure.  He will be admitted to medicine and diuresed  {Document critical care time when appropriate:1} {Document review of labs and clinical decision tools ie heart score, Chads2Vasc2 etc:1}  {Document your independent review of radiology images, and any outside records:1} {Document your discussion with family members, caretakers, and with consultants:1} {Document social determinants of health affecting pt's care:1} {Document your decision making why or why not admission, treatments were needed:1} Final Clinical Impression(s) / ED Diagnoses Final diagnoses:  Systolic congestive heart failure, unspecified HF chronicity    Rx / DC Orders ED Discharge Orders     None

## 2022-09-20 NOTE — Assessment & Plan Note (Addendum)
-   Continue outpatient follow-up ?

## 2022-09-20 NOTE — ED Notes (Signed)
Pt called out stating he is having difficulty "getting air". Pt speaking in complete sentences, no signs of distress. Room air saturations of 95% with respirations of 18 per minute. Pt placed on 2L [place] for comfort.

## 2022-09-20 NOTE — Progress Notes (Addendum)
Pharmacist contacted regarding order for IV lasix 60 mg.Pharmacy verified with provider. Order reentered to administer for tomorrow morning.

## 2022-09-20 NOTE — Assessment & Plan Note (Signed)
On eliquis

## 2022-09-20 NOTE — Assessment & Plan Note (Addendum)
Continuing amiodarone Continuing eliquis

## 2022-09-20 NOTE — H&P (Signed)
History and Physical    Patient: Grant [name] [id number] DOB: Jun 29, 1945 DOA: 09/20/2022 DOS: the patient was seen and examined on 09/20/2022 PCP: [facility]  Patient coming from: Home who lives there with his wife  Chief Complaint:  Chief Complaint  Patient presents with   Shortness of Breath   HPI: Grant [name] is a 77 y.o. male with medical history significant of  DVT, liver cancer (treatment at [facility] status post left liver lobectomy, radioembolization and cryoablation in August and September 2022) A-fib on chronic anticoagulation, right CVA, CHF, CAD with AICD, HLD, T2DM, HTN, tobacco use, CKD, hypothyroid use who presents to the ED with increasing shortness of breath times the last 3 weeks.  It got much worse yesterday and today.  The patient has a history of CHF and is supposed to be following a low-sodium diet as well as monitoring daily weights although he does not really follow this very strictly.  Additionally he has ongoing tobacco use.  Patient came to the ED today where he was noted to be mildly hypoxic with an elevated BNP of 971 and a chest x-ray that showed fluid overload as well as trace pleural effusions.  Patient's last echo was in 12 of 23 and showed a EF of 60 to 65%.  We are asked to admit for management of CHF. Review of Systems: As mentioned in the history of present illness. All other systems reviewed and are negative. Past Medical History:  Diagnosis Date   AICD (automatic cardioverter/defibrillator) present    BPH (benign prostatic hyperplasia)    CAD (coronary artery disease)    DM (diabetes mellitus)    HLD (hyperlipidemia)    Hyperglycemia 03/09/2017   Hypothyroidism    NSVT (nonsustained ventricular tachycardia)    Obstructive sleep apnea    Stroke    Past Surgical History:  Procedure Laterality Date   ABDOMINAL SURGERY  1981   repair lungs, liver, and intenstines from trauma   Social History:  reports that he has been  smoking cigarettes. He has a 32.00 pack-year smoking history. His smokeless tobacco use includes chew. He reports current alcohol use. He reports that he does not currently use drugs after having used the following drugs: Marijuana.  Allergies  Allergen Reactions   Food Anaphylaxis    LAMB OR GOAT   Zestril [Lisinopril] Cough    Family History  Problem Relation Age of Onset   CAD Mother    Hypertension Mother    Cancer - Colon Father    Cancer - Colon Brother     Prior to Admission medications   Medication Sig Start Date End Date Taking? Authorizing Provider  acetaminophen (TYLENOL) 325 MG tablet Take 2 tablets (650 mg total) by mouth every 4 (four) hours as needed for mild pain (or temp > 37.5 C (99.5 F)). 06/23/22   Angiulli, Lavon Paganini, PA-C  amiodarone (PACERONE) 200 MG tablet Take 1 tablet (200 mg total) by mouth daily. 06/23/22   Angiulli, Lavon Paganini, PA-C  amoxicillin (AMOXIL) 250 MG capsule Take 1 capsule (250 mg total) by mouth every 8 (eight) hours. 06/23/22   Angiulli, Lavon Paganini, PA-C  apixaban (ELIQUIS) 5 MG TABS tablet Take 1 tablet (5 mg total) by mouth 2 (two) times daily. 06/23/22   Angiulli, Lavon Paganini, PA-C  aspirin 81 MG chewable tablet Chew 81 mg by mouth daily.    [provider]  atorvastatin (LIPITOR) 40 MG tablet Take 1 tablet (40 mg total) by mouth at  bedtime. 06/23/22   Angiulli, Lavon Paganini, PA-C  escitalopram (LEXAPRO) 10 MG tablet Take 1 tablet (10 mg total) by mouth at bedtime. 06/23/22   Angiulli, Lavon Paganini, PA-C  insulin glargine (LANTUS) 100 UNIT/ML Solostar Pen Inject 10 Units into the skin at bedtime. 06/23/22   Angiulli, Lavon Paganini, PA-C  Insulin Pen Needle 32G X 4 MM MISC Use with Basaglar 06/23/22   Angiulli, Lavon Paganini, PA-C  levothyroxine (SYNTHROID) 50 MCG tablet Take 1 tablet (50 mcg total) by mouth daily before breakfast. 06/23/22   Angiulli, Lavon Paganini, PA-C  Multiple Vitamin (MULTIVITAMIN WITH MINERALS) TABS tablet Take 1 tablet by mouth daily. 06/24/22   Angiulli,  Lavon Paganini, PA-C  nitroGLYCERIN (NITROSTAT) 0.4 MG SL tablet Place 1 tablet (0.4 mg total) under the tongue every 5 (five) minutes as needed for chest pain. 06/23/22   Angiulli, Lavon Paganini, PA-C  omeprazole (PRILOSEC) 40 MG capsule Take 1 capsule (40 mg total) by mouth 2 (two) times daily. 06/23/22   Angiulli, Lavon Paganini, PA-C  polyethylene glycol (MIRALAX / GLYCOLAX) 17 g packet Take 17 g by mouth daily. 06/24/22   Angiulli, Lavon Paganini, PA-C  pregabalin (LYRICA) 25 MG capsule Take 1 capsule (25 mg total) by mouth 2 (two) times daily. 06/23/22   Angiulli, Lavon Paganini, PA-C  tamsulosin (FLOMAX) 0.4 MG CAPS capsule Take 1 capsule (0.4 mg total) by mouth daily after supper. 06/23/22   Cathlyn Parsons, PA-C    Physical Exam: Vitals:   09/20/22 1212 09/20/22 1220 09/20/22 1647  BP: (!) 149/99  (!) 168/101  Pulse: 91  97  Resp: 20  (!) 22  Temp: 98.9 F (37.2 C)  98.1 F (36.7 C)  TempSrc: Oral  Oral  SpO2: 95%  95%  Weight:  97.5 kg   Height:  6\' 2"  (1.88 m)    Physical Examination: General appearance - alert, well appearing, and in no distress Neck - supple, no significant adenopathy Chest - clear to auscultation, no wheezes, rales or rhonchi, symmetric air entry Heart - normal rate, regular rhythm, normal S1, S2, no murmurs, rubs, clicks or gallops Abdomen - soft, nontender, nondistended, no masses or organomegaly Extremities -1-2+ peripheral edema  Data Reviewed: Results for orders placed or performed during the hospital encounter of 09/20/22 (from the past 24 hour(s))  CBC with Differential     Status: Abnormal   Collection Time: 09/20/22  2:05 PM  Result Value Ref Range   WBC 7.1 4.0 - 10.5 K/uL   RBC 4.13 (L) 4.22 - 5.81 MIL/uL   Hemoglobin 11.1 (L) 13.0 - 17.0 g/dL   HCT 36.4 (L) 39.0 - 52.0 %   MCV 88.1 80.0 - 100.0 fL   MCH 26.9 26.0 - 34.0 pg   MCHC 30.5 30.0 - 36.0 g/dL   RDW 14.1 11.5 - 15.5 %   Platelets 217 150 - 400 K/uL   nRBC 0.0 0.0 - 0.2 %   Neutrophils Relative % 68 %    Neutro Abs 4.8 1.7 - 7.7 K/uL   Lymphocytes Relative 20 %   Lymphs Abs 1.4 0.7 - 4.0 K/uL   Monocytes Relative 10 %   Monocytes Absolute 0.7 0.1 - 1.0 K/uL   Eosinophils Relative 1 %   Eosinophils Absolute 0.1 0.0 - 0.5 K/uL   Basophils Relative 1 %   Basophils Absolute 0.0 0.0 - 0.1 K/uL   Immature Granulocytes 0 %   Abs Immature Granulocytes 0.02 0.00 - 0.07 K/uL  Comprehensive metabolic panel  Status: Abnormal   Collection Time: 09/20/22  2:05 PM  Result Value Ref Range   Sodium 136 135 - 145 mmol/L   Potassium 3.9 3.5 - 5.1 mmol/L   Chloride 106 98 - 111 mmol/L   CO2 22 22 - 32 mmol/L   Glucose, Bld 136 (H) 70 - 99 mg/dL   BUN 13 8 - 23 mg/dL   Creatinine, Ser 1.06 0.61 - 1.24 mg/dL   Calcium 8.3 (L) 8.9 - 10.3 mg/dL   Total Protein 6.5 6.5 - 8.1 g/dL   Albumin 3.7 3.5 - 5.0 g/dL   AST 20 15 - 41 U/L   ALT 14 0 - 44 U/L   Alkaline Phosphatase 48 38 - 126 U/L   Total Bilirubin 0.5 0.3 - 1.2 mg/dL   GFR, Estimated >60 >60 mL/min   Anion gap 8 5 - 15  Brain natriuretic peptide     Status: Abnormal   Collection Time: 09/20/22  2:05 PM  Result Value Ref Range   B Natriuretic Peptide 971.2 (H) 0.0 - 100.0 pg/mL  Glucose, capillary     Status: Abnormal   Collection Time: 09/20/22  5:59 PM  Result Value Ref Range   Glucose-Capillary 136 (H) 70 - 99 mg/dL  Glucose, capillary     Status: Abnormal   Collection Time: 09/20/22  8:15 PM  Result Value Ref Range   Glucose-Capillary 188 (H) 70 - 99 mg/dL   DG Chest 2 View  Result Date: 09/20/2022 CLINICAL DATA:  Shortness of breath EXAM: CHEST - 2 VIEW COMPARISON:  06/14/2022 FINDINGS: Left-sided implanted cardiac device remains in place. Mild cardiomegaly. Aortic atherosclerosis. Pulmonary vascular congestion. Mild interstitial prominence. No focal consolidation. Trace pleural effusions. No pneumothorax. IMPRESSION: Findings suggestive of mild CHF with interstitial edema and trace pleural effusions. Electronically Signed   By:  Davina Poke D.O.   On: 09/20/2022 13:14    EKG shows sinus tach, prolonged QT, PVCs, inverted T's in the lateral leads  Assessment and Plan: * CHF (congestive heart failure) IV diuresis Last ECHO with normal EF, but poor quality Not on ARB, consider addition of this-patient with previous intolerance to ACE-I  Major depressive disorder, recurrent episode, moderate Continue Lexapro  Coronary atherosclerosis of native coronary artery AICD, statin, NTG  Sensorineural hearing loss, bilateral Patient has hearing aids, he has been encouraged to wear these.  Obstructive sleep apnea Patient has CPAP at home which he has used "2 times"  Liver cell carcinoma Currently stable Patient last seen at [facility] and missed his last appointment for follow-up scans.  Gastroesophageal reflux disease Continue PPI  Right middle cerebral artery stroke On eliquis  Chronic atrial fibrillation Continue amiodarone On Eliquis  CAD (coronary artery disease) Has AICD Continue Lipitor Nitroglycerin as needed  Hypothyroidism Continue levothyroxine  Tobacco abuse Patient reports smoking approximately 3 cigarettes/day.  He was offered and declined nicotine patch.  Smoking cessation counseling completed  Benign prostatic hyperplasia Continue finasteride Continue Flomax  Essential hypertension On no antihypertensives at present IV lopressor if needed  Diet-controlled diabetes mellitus Continue Lantus Sliding scale insulin      Advance Care Planning:   Code Status: Prior full wife has healthcare power of attorney.  He has a living will which is in place.  Right now he still wants everything done.  Consults: Cardiology called by EDP.  They requested formal consultation in the morning.  Family Communication: Wife at bedside  Severity of Illness: The appropriate patient status for this patient is OBSERVATION. Observation  status is judged to be reasonable and necessary in order to provide  the required intensity of service to ensure the patient's safety. The patient's presenting symptoms, physical exam findings, and initial radiographic and laboratory data in the context of their medical condition is felt to place them at decreased risk for further clinical deterioration. Furthermore, it is anticipated that the patient will be medically stable for discharge from the hospital within 2 midnights of admission.   Author: Donnamae Jude, MD 09/20/2022 5:32 PM  For on call review [email or web address].

## 2022-09-20 NOTE — Assessment & Plan Note (Signed)
Patient has CPAP at home which he has used "2 times"

## 2022-09-20 NOTE — Assessment & Plan Note (Signed)
Continue levothyroxine 

## 2022-09-20 NOTE — ED Triage Notes (Signed)
Pt BIBA from home. Pt c/o an "air bubble" in his stomach. VSS with EMS. EMS questions possible early dementia.   When speaking with pt, he c/o [facility] and needing oxygen. Pt on room air in triage, speaking in full sentences satting 98%  150/100 150 CBG 99% 3L 18 RR 92 HR

## 2022-09-20 NOTE — Assessment & Plan Note (Signed)
Patient has hearing aids, he has been encouraged to wear these.

## 2022-09-21 ENCOUNTER — Observation Stay (HOSPITAL_COMMUNITY): Payer: No Typology Code available for payment source

## 2022-09-21 DIAGNOSIS — K7030 Alcoholic cirrhosis of liver without ascites: Secondary | ICD-10-CM | POA: Diagnosis present

## 2022-09-21 DIAGNOSIS — I482 Chronic atrial fibrillation, unspecified: Secondary | ICD-10-CM | POA: Diagnosis present

## 2022-09-21 DIAGNOSIS — E785 Hyperlipidemia, unspecified: Secondary | ICD-10-CM | POA: Diagnosis present

## 2022-09-21 DIAGNOSIS — I509 Heart failure, unspecified: Secondary | ICD-10-CM

## 2022-09-21 DIAGNOSIS — J9601 Acute respiratory failure with hypoxia: Secondary | ICD-10-CM | POA: Diagnosis present

## 2022-09-21 DIAGNOSIS — E876 Hypokalemia: Secondary | ICD-10-CM | POA: Diagnosis present

## 2022-09-21 DIAGNOSIS — Z7901 Long term (current) use of anticoagulants: Secondary | ICD-10-CM | POA: Diagnosis not present

## 2022-09-21 DIAGNOSIS — Z79890 Hormone replacement therapy (postmenopausal): Secondary | ICD-10-CM | POA: Diagnosis not present

## 2022-09-21 DIAGNOSIS — B182 Chronic viral hepatitis C: Secondary | ICD-10-CM | POA: Diagnosis present

## 2022-09-21 DIAGNOSIS — N1831 Chronic kidney disease, stage 3a: Secondary | ICD-10-CM | POA: Diagnosis present

## 2022-09-21 DIAGNOSIS — G4733 Obstructive sleep apnea (adult) (pediatric): Secondary | ICD-10-CM | POA: Diagnosis present

## 2022-09-21 DIAGNOSIS — I5033 Acute on chronic diastolic (congestive) heart failure: Secondary | ICD-10-CM | POA: Diagnosis not present

## 2022-09-21 DIAGNOSIS — E1122 Type 2 diabetes mellitus with diabetic chronic kidney disease: Secondary | ICD-10-CM | POA: Diagnosis present

## 2022-09-21 DIAGNOSIS — F331 Major depressive disorder, recurrent, moderate: Secondary | ICD-10-CM | POA: Diagnosis present

## 2022-09-21 DIAGNOSIS — Z8249 Family history of ischemic heart disease and other diseases of the circulatory system: Secondary | ICD-10-CM | POA: Diagnosis not present

## 2022-09-21 DIAGNOSIS — N400 Enlarged prostate without lower urinary tract symptoms: Secondary | ICD-10-CM | POA: Diagnosis present

## 2022-09-21 DIAGNOSIS — I2510 Atherosclerotic heart disease of native coronary artery without angina pectoris: Secondary | ICD-10-CM | POA: Diagnosis present

## 2022-09-21 DIAGNOSIS — E039 Hypothyroidism, unspecified: Secondary | ICD-10-CM | POA: Diagnosis present

## 2022-09-21 DIAGNOSIS — J449 Chronic obstructive pulmonary disease, unspecified: Secondary | ICD-10-CM | POA: Diagnosis present

## 2022-09-21 DIAGNOSIS — Z794 Long term (current) use of insulin: Secondary | ICD-10-CM | POA: Diagnosis not present

## 2022-09-21 DIAGNOSIS — I5023 Acute on chronic systolic (congestive) heart failure: Secondary | ICD-10-CM | POA: Diagnosis present

## 2022-09-21 DIAGNOSIS — H903 Sensorineural hearing loss, bilateral: Secondary | ICD-10-CM | POA: Diagnosis present

## 2022-09-21 DIAGNOSIS — F17210 Nicotine dependence, cigarettes, uncomplicated: Secondary | ICD-10-CM | POA: Diagnosis present

## 2022-09-21 DIAGNOSIS — K219 Gastro-esophageal reflux disease without esophagitis: Secondary | ICD-10-CM | POA: Diagnosis present

## 2022-09-21 DIAGNOSIS — I130 Hypertensive heart and chronic kidney disease with heart failure and stage 1 through stage 4 chronic kidney disease, or unspecified chronic kidney disease: Secondary | ICD-10-CM | POA: Diagnosis present

## 2022-09-21 DIAGNOSIS — Z79899 Other long term (current) drug therapy: Secondary | ICD-10-CM | POA: Diagnosis not present

## 2022-09-21 LAB — BASIC METABOLIC PANEL
Anion gap: 7 (ref 5–15)
BUN: 14 mg/dL (ref 8–23)
CO2: 26 mmol/L (ref 22–32)
Calcium: 8.4 mg/dL — ABNORMAL LOW (ref 8.9–10.3)
Chloride: 104 mmol/L (ref 98–111)
Creatinine, Ser: 1.13 mg/dL (ref 0.61–1.24)
GFR, Estimated: >60 mL/min (ref >60)
Glucose, Bld: 118 mg/dL — ABNORMAL HIGH (ref 70–99)
Potassium: 3.4 mmol/L — ABNORMAL LOW (ref 3.5–5.1)
Sodium: 137 mmol/L (ref 135–145)

## 2022-09-21 LAB — CBC
HCT: 35.5 % — ABNORMAL LOW (ref 39.0–52.0)
Hemoglobin: 10.9 g/dL — ABNORMAL LOW (ref 13.0–17.0)
MCH: 26.8 pg (ref 26.0–34.0)
MCHC: 30.7 g/dL (ref 30.0–36.0)
MCV: 87.4 fL (ref 80.0–100.0)
Platelets: 193 10*3/uL (ref 150–400)
RBC: 4.06 MIL/uL — ABNORMAL LOW (ref 4.22–5.81)
RDW: 13.9 % (ref 11.5–15.5)
WBC: 6.6 10*3/uL (ref 4.0–10.5)
nRBC: 0 % (ref 0.0–0.2)

## 2022-09-21 LAB — ECHOCARDIOGRAM COMPLETE
Height: 74 in
S' Lateral: 4.5 cm
Weight: 3343.94 oz

## 2022-09-21 LAB — TSH: TSH: 3.95 u[IU]/mL (ref 0.350–4.500)

## 2022-09-21 LAB — HEMOGLOBIN A1C
Hgb A1c MFr Bld: 7.2 % — ABNORMAL HIGH (ref 4.8–5.6)
Mean Plasma Glucose: 160 mg/dL

## 2022-09-21 LAB — GLUCOSE, CAPILLARY
Glucose-Capillary: 121 mg/dL — ABNORMAL HIGH (ref 70–99)
Glucose-Capillary: 128 mg/dL — ABNORMAL HIGH (ref 70–99)
Glucose-Capillary: 173 mg/dL — ABNORMAL HIGH (ref 70–99)
Glucose-Capillary: 120 mg/dL — ABNORMAL HIGH (ref 70–99)

## 2022-09-21 MED ORDER — AMIODARONE HCL IN DEXTROSE 360-4.14 MG/200ML-% IV SOLN: 60.0000 mg/h | INTRAVENOUS | Status: DC

## 2022-09-21 MED ORDER — AMIODARONE HCL 200 MG PO TABS
400.0000 mg | ORAL_TABLET | Freq: Every day | ORAL | Status: DC
  Administered 2022-09-21: 400 mg via ORAL
  Filled 2022-09-21: qty 2

## 2022-09-21 MED ORDER — AMIODARONE HCL IN DEXTROSE 360-4.14 MG/200ML-% IV SOLN: 30.0000 mg/h | INTRAVENOUS | Status: DC

## 2022-09-21 MED ORDER — AMIODARONE HCL IN DEXTROSE 360-4.14 MG/200ML-% IV SOLN
60.0000 mg/h | INTRAVENOUS | Status: DC
  Filled 2022-09-21: qty 200

## 2022-09-21 MED ORDER — AMIODARONE LOAD VIA INFUSION
150.0000 mg | Freq: Once | INTRAVENOUS | Status: DC
  Filled 2022-09-21: qty 83.34

## 2022-09-21 MED ORDER — AMIODARONE HCL IN DEXTROSE 360-4.14 MG/200ML-% IV SOLN
30.0000 mg/h | INTRAVENOUS | Status: DC
  Filled 2022-09-21: qty 200

## 2022-09-21 MED ORDER — PERFLUTREN LIPID MICROSPHERE
1.0000 mL | INTRAVENOUS | Status: AC | PRN
  Administered 2022-09-21: 2 mL via INTRAVENOUS

## 2022-09-21 MED ORDER — AMIODARONE HCL IN DEXTROSE 360-4.14 MG/200ML-% IV SOLN
60.0000 mg/h | INTRAVENOUS | Status: DC
  Administered 2022-09-21 (×2): 30 mg/h via INTRAVENOUS
  Administered 2022-09-22 – 2022-09-23 (×4): 60 mg/h via INTRAVENOUS
  Filled 2022-09-21 (×8): qty 200

## 2022-09-21 NOTE — Consult Note (Addendum)
Cardiology Consultation   Patient ID: Aivan Vanfossan MRN: [id number]; DOB: Apr 07, 1946  Admit date: 09/20/2022 Date of Consult: 09/21/2022  PCP:  [facility], [place] Providers Cardiologist:  None        Patient Profile:   Javis Jaggard is a 77 y.o. male with a hx of  DVT, liver cancer (treatment at [facility] status post left liver lobectomy, radioembolization and cryoablation in August and September 2022) A-fib on chronic anticoagulation, right CVA, CHF, CAD with AICD, HLD, T2DM, HTN, tobacco use, CKD, hypothyroidism who is being seen 09/21/2022 for the evaluation of heart failure exacerbation at the request of Dr. Roderic Palau.  History of Present Illness:   Mr. Bachus presented to the ED on 4/1 with symptoms of dyspnea, worsening over a 3 week period but acutely exacerbated in the last 2-3 days. Per patient, symptoms most noticeable at night/when first waking up. He describes feeling like there was a weight on his chest preventing him from taking deep breaths. Breathing was so difficult at night that "I didn't want to sleep." Also endorses seeing some lower extremity edema. Initial workup noted hypoxia and BNP elevated to 971. CXR noted mild CHF with interstitial edema and trace bilateral pleural effusions.   This morning, patient reports breathing has improved with diuresis. He reports noticing onset of palpitations/rapid HR yesterday, continues to feel these symptoms today.   Past Medical History:  Diagnosis Date   AICD (automatic cardioverter/defibrillator) present    BPH (benign prostatic hyperplasia)    CAD (coronary artery disease)    DM (diabetes mellitus)    HLD (hyperlipidemia)    Hyperglycemia 03/09/2017   Hypothyroidism    NSVT (nonsustained ventricular tachycardia)    Obstructive sleep apnea    Stroke     Past Surgical History:  Procedure Laterality Date   ABDOMINAL SURGERY  1981   repair lungs, liver, and intenstines from trauma      Home Medications:  Prior to Admission medications   Medication Sig Start Date End Date Taking? Authorizing Provider  acetaminophen (TYLENOL) 325 MG tablet Take 2 tablets (650 mg total) by mouth every 4 (four) hours as needed for mild pain (or temp > 37.5 C (99.5 F)). 06/23/22   Angiulli, Lavon Paganini, PA-C  amiodarone (PACERONE) 200 MG tablet Take 1 tablet (200 mg total) by mouth daily. 06/23/22   Angiulli, Lavon Paganini, PA-C  amoxicillin (AMOXIL) 250 MG capsule Take 1 capsule (250 mg total) by mouth every 8 (eight) hours. 06/23/22   Angiulli, Lavon Paganini, PA-C  apixaban (ELIQUIS) 5 MG TABS tablet Take 1 tablet (5 mg total) by mouth 2 (two) times daily. 06/23/22   Angiulli, Lavon Paganini, PA-C  aspirin 81 MG chewable tablet Chew 81 mg by mouth daily.    [provider]  atorvastatin (LIPITOR) 40 MG tablet Take 1 tablet (40 mg total) by mouth at bedtime. 06/23/22   Angiulli, Lavon Paganini, PA-C  Cholecalciferol 125 MCG (5000 UT) TABS Take 1 tablet by mouth daily.    [provider]  escitalopram (LEXAPRO) 10 MG tablet Take 1 tablet (10 mg total) by mouth at bedtime. 06/23/22   Angiulli, Lavon Paganini, PA-C  insulin glargine (LANTUS) 100 UNIT/ML Solostar Pen Inject 10 Units into the skin at bedtime. 06/23/22   Angiulli, Lavon Paganini, PA-C  Insulin Pen Needle 32G X 4 MM MISC Use with Basaglar 06/23/22   Angiulli, Lavon Paganini, PA-C  levothyroxine (SYNTHROID) 50 MCG tablet Take 1 tablet (50 mcg total)  by mouth daily before breakfast. 06/23/22   Angiulli, Lavon Paganini, PA-C  Multiple Vitamin (MULTIVITAMIN WITH MINERALS) TABS tablet Take 1 tablet by mouth daily. 06/24/22   Angiulli, Lavon Paganini, PA-C  nitroGLYCERIN (NITROSTAT) 0.4 MG SL tablet Place 1 tablet (0.4 mg total) under the tongue every 5 (five) minutes as needed for chest pain. 06/23/22   Angiulli, Lavon Paganini, PA-C  omeprazole (PRILOSEC) 40 MG capsule Take 1 capsule (40 mg total) by mouth 2 (two) times daily. 06/23/22   Angiulli, Lavon Paganini, PA-C  polyethylene glycol (MIRALAX / GLYCOLAX) 17  g packet Take 17 g by mouth daily. 06/24/22   Angiulli, Lavon Paganini, PA-C  pregabalin (LYRICA) 25 MG capsule Take 1 capsule (25 mg total) by mouth 2 (two) times daily. 06/23/22   Angiulli, Lavon Paganini, PA-C  tamsulosin (FLOMAX) 0.4 MG CAPS capsule Take 1 capsule (0.4 mg total) by mouth daily after supper. 06/23/22   Angiulli, Lavon Paganini, PA-C    Inpatient Medications: Scheduled Meds:  amiodarone  150 mg Intravenous Once   amiodarone  400 mg Oral Daily   apixaban  5 mg Oral BID   aspirin  81 mg Oral Daily   atorvastatin  40 mg Oral QHS   escitalopram  10 mg Oral QHS   finasteride  5 mg Oral Daily   furosemide  60 mg Intravenous BID   insulin aspart  0-15 Units Subcutaneous TID WC   insulin glargine-yfgn  10 Units Subcutaneous QHS   levothyroxine  50 mcg Oral QAC breakfast   losartan  25 mg Oral Daily   pantoprazole  40 mg Oral BID   pregabalin  25 mg Oral BID   sodium chloride flush  3 mL Intravenous Q12H   tamsulosin  0.4 mg Oral QPC supper   Continuous Infusions:  sodium chloride     PRN Meds: sodium chloride, acetaminophen, albuterol, HYDROcodone-acetaminophen, metoprolol tartrate, nitroGLYCERIN, polyethylene glycol, sodium chloride flush  Allergies:    Allergies  Allergen Reactions   Food Anaphylaxis    LAMB OR GOAT   Zestril [Lisinopril] Cough    Social History:   Social History   Socioeconomic History   Marital status: Married    Spouse name: Not on file   Number of children: Not on file   Years of education: Not on file   Highest education level: Not on file  Occupational History   Occupation: retired  Tobacco Use   Smoking status: Every Day    Packs/day: 0.80    Years: 40.00    Additional pack years: 0.00    Total pack years: 32.00    Types: Cigarettes   Smokeless tobacco: Current    Types: Chew  Vaping Use   Vaping Use: Former  Substance and Sexual Activity   Alcohol use: Yes    Alcohol/week: 0.0 standard drinks of alcohol    Comment: OCCASIONAL   Drug use:  Not Currently    Types: Marijuana    Comment: in [place] during war   Sexual activity: Not on file  Other Topics Concern   Not on file  Social History Narrative   Not on file   Social Determinants of Health   Financial Resource Strain: Not on file  Food Insecurity: No Food Insecurity (09/20/2022)   Hunger Vital Sign    Worried About Running Out of Food in the Last Year: Never true    Ran Out of Food in the Last Year: Never true  Transportation Needs: No Transportation Needs (09/20/2022)   PRAPARE -  Hydrologist (Medical): No    Lack of Transportation (Non-Medical): No  Physical Activity: Not on file  Stress: Not on file  Social Connections: Not on file  Intimate Partner Violence: Not At Risk (09/20/2022)   Humiliation, Afraid, Rape, and Kick questionnaire    Fear of Current or Ex-Partner: No    Emotionally Abused: No    Physically Abused: No    Sexually Abused: No    Family History:    Family History  Problem Relation Age of Onset   CAD Mother    Hypertension Mother    Cancer - Colon Father    Cancer - Colon Brother      ROS:  Please see the history of present illness.   All other ROS reviewed and negative.     Physical Exam/Data:   Vitals:   09/21/22 0036 09/21/22 0258 09/21/22 0500 09/21/22 0519  BP: (!) 148/86 99/70  104/87  Pulse: 79 100  (!) 122  Resp: 18 17  17   Temp: 99.2 F (37.3 C) 98.6 F (37 C)  97.6 F (36.4 C)  TempSrc: Oral Oral  Oral  SpO2: 96% 96%  97%  Weight:   94.8 kg   Height:        Intake/Output Summary (Last 24 hours) at 09/21/2022 0958 Last data filed at 09/21/2022 0836 Gross per 24 hour  Intake 580 ml  Output 2375 ml  Net -1795 ml      09/21/2022    5:00 AM 09/20/2022   12:20 PM 06/24/2022    5:30 AM  Last 3 Weights  Weight (lbs) 208 lb 15.9 oz 215 lb 217 lb 2.5 oz  Weight (kg) 94.8 kg 97.523 kg 98.5 kg     Body mass index is 26.83 kg/m.  General:  Well nourished, well developed, in no acute  distress HEENT: normal Neck: very mild elevation of JVD Vascular: No carotid bruits; Distal pulses 2+ bilaterally Cardiac:  normal S1, S2; irregularly irregular and rapid; no murmur  Lungs:  clear to auscultation bilaterally, no wheezing, rhonchi or rales  Abd: soft, nontender, no hepatomegaly  Ext: no edema Musculoskeletal:  No deformities, BUE and BLE strength normal and equal Skin: warm and dry  Neuro:  CNs 2-12 intact, no focal abnormalities noted Psych:  Normal affect   EKG:  The EKG was personally reviewed and demonstrates:  afib with RVR Telemetry:  Telemetry was personally reviewed and demonstrates:  afib with RVR  Relevant CV Studies:  05/21/22 TTE  IMPRESSIONS     1. Extremely limited due to poor sound wave transmission; LV function  appears to be preserved on definity images; suggest TEE or cardiac MRI to  better evaluate if clinically indicated.   2. Left ventricular ejection fraction, by estimation, is 60 to 65%. The  left ventricle has normal function. The left ventricle has no regional  wall motion abnormalities. Left ventricular diastolic parameters are  consistent with Grade I diastolic  dysfunction (impaired relaxation).   3. Right ventricular systolic function was not well visualized. The right  ventricular size is not well visualized.   4. The mitral valve was not well visualized. not well visualized mitral  valve regurgitation.   5. Tricuspid valve regurgitation not well visualized.   6. The aortic valve is normal in structure. Aortic valve regurgitation  Not well visualized.   7. Pulmonic valve regurgitation not well visualized.   8. Aortic dilatation noted. There is mild dilatation of the aortic root,  measuring 40 mm.   FINDINGS   Left Ventricle: Left ventricular ejection fraction, by estimation, is 60  to 65%. The left ventricle has normal function. The left ventricle has no  regional wall motion abnormalities. Definity contrast agent was given IV   to delineate the left ventricular   endocardial borders. The left ventricular internal cavity size was normal  in size. Suboptimal image quality limits for assessment of left  ventricular hypertrophy. Left ventricular diastolic parameters are  consistent with Grade I diastolic dysfunction  (impaired relaxation).   Right Ventricle: The right ventricular size is not well visualized. Right  vetricular wall thickness was not well visualized. Right ventricular  systolic function was not well visualized.   Left Atrium: Left atrial size was not well visualized.   Right Atrium: Right atrial size was not well visualized.   Pericardium: There is no evidence of pericardial effusion.   Mitral Valve: The mitral valve was not well visualized. Not well  visualized mitral valve regurgitation.   Tricuspid Valve: The tricuspid valve is grossly normal. Tricuspid valve  regurgitation not well visualized.   Aortic Valve: The aortic valve is normal in structure. Aortic valve  regurgitation Not well visualized.   Pulmonic Valve: The pulmonic valve was not well visualized. Pulmonic valve  regurgitation not well visualized.   Aorta: Aortic dilatation noted. There is mild dilatation of the aortic  root, measuring 40 mm.   IAS/Shunts: The interatrial septum was not well visualized.   Additional Comments: Extremely limited due to poor sound wave  transmission; LV function appears to be preserved on definity images;  suggest TEE or cardiac MRI to better evaluate if clinically indicated. A  device lead is visualized.   Laboratory Data:  High Sensitivity Troponin:  No results for input(s): "TROPONINIHS" in the last 720 hours.   Chemistry Recent Labs  Lab 09/20/22 1405 09/21/22 0532  NA 136 137  K 3.9 3.4*  CL 106 104  CO2 22 26  GLUCOSE 136* 118*  BUN 13 14  CREATININE 1.06 1.13  CALCIUM 8.3* 8.4*  GFRNONAA >60 >60  ANIONGAP 8 7    Recent Labs  Lab 09/20/22 1405  PROT 6.5  ALBUMIN  3.7  AST 20  ALT 14  ALKPHOS 48  BILITOT 0.5   Lipids No results for input(s): "CHOL", "TRIG", "HDL", "LABVLDL", "LDLCALC", "CHOLHDL" in the last 168 hours.  Hematology Recent Labs  Lab 09/20/22 1405 09/21/22 0532  WBC 7.1 6.6  RBC 4.13* 4.06*  HGB 11.1* 10.9*  HCT 36.4* 35.5*  MCV 88.1 87.4  MCH 26.9 26.8  MCHC 30.5 30.7  RDW 14.1 13.9  PLT 217 193   Thyroid  Recent Labs  Lab 09/21/22 0846  TSH 3.950    BNP Recent Labs  Lab 09/20/22 1405  BNP 971.2*    DDimer No results for input(s): "DDIMER" in the last 168 hours.   Radiology/Studies:  DG Chest 2 View  Result Date: 09/20/2022 CLINICAL DATA:  Shortness of breath EXAM: CHEST - 2 VIEW COMPARISON:  06/14/2022 FINDINGS: Left-sided implanted cardiac device remains in place. Mild cardiomegaly. Aortic atherosclerosis. Pulmonary vascular congestion. Mild interstitial prominence. No focal consolidation. Trace pleural effusions. No pneumothorax. IMPRESSION: Findings suggestive of mild CHF with interstitial edema and trace pleural effusions. Electronically Signed   By: Davina Poke D.O.   On: 09/20/2022 13:14     Assessment and Plan:   Acute CHF  Patient with 3 weeks of worsening dyspnea, found to be hypoxic with BNP of 971.  Has received IV diuresis and is now net negative 1.795L with clinical improvement. Last echocardiogram from 05/21/22 with likely preserved EF (poor echo windows).   Continue IV diuresis today Will repeat echocardiogram as images from December TTE are extremely limited.  Unclear what prompted this exacerbation. TSH WNL. I wonder if patient has been having RVR breakthrough at home. Will need to optimize HF GDMT prior to discharge and reinforce low salt diet.  Patient likely a good candidate for SGLT2  CAD s/p ICD implant  Patient with initial ICD implantation in 2007 following what was reportedly polymorphic VT. Also underwent cardiac stenting.  Chronic atrial fibrillation CHA2DS2-VASc Score = 8    Patient in afib with RVR into the 160s this morning and does report sensation of palpitations. Chronically takes amiodarone 200mg  and Eliquis.  Will run IV amiodarone at 30mg /hour this morning (to avoid excess IV piggyback fluid admin) and increase daily dose to 400mg  to improve rate control. Low normal BP precludes addition of beta blocker at this time Continue Eliquis  Per primary team:  Major depressive disorder GERD Right middle cerebral artery stroke Hypothyroidism BPH OSA Hypothyroidism DM type II  Risk Assessment/Risk Scores:        New York Heart Association (NYHA) Functional Class NYHA Class III  CHA2DS2-VASc Score = 8   This indicates a 10.8% annual risk of stroke. The patient's score is based upon: CHF History: 1 HTN History: 1 Diabetes History: 1 Stroke History: 2 Vascular Disease History: 1 Age Score: 2 Gender Score: 0         For questions or updates, please contact [place] Please consult [email or web address] for contact info under    Signed, Lily Kocher, PA-C  09/21/2022 9:58 AM

## 2022-09-21 NOTE — Progress Notes (Signed)
PROGRESS NOTE    Grant [name]  [id number] DOB: July 18, 1945 DOA: 09/20/2022 PCP: [facility]   Brief Narrative:  Grant [name] is a 77 y.o. male with medical history significant of  DVT, liver cancer (treatment at [facility] status post left liver lobectomy, radioembolization and cryoablation in August and September 2022) A-fib on chronic anticoagulation, right CVA, CHF, CAD with AICD, HLD, T2DM, HTN, tobacco use, CKD, hypothyroid use who presents to the ED with increasing shortness of breath times the last 3 weeks.  Hospitalist called for admission, cardiology called in consult.  Assessment & Plan:   Principal Problem:   CHF (congestive heart failure) Active Problems:   Diet-controlled diabetes mellitus   Essential hypertension   Benign prostatic hyperplasia   Tobacco abuse   Hypothyroidism   CAD (coronary artery disease)   Chronic atrial fibrillation   Right middle cerebral artery stroke   Gastroesophageal reflux disease   Liver cell carcinoma   Moderate chronic obstructive pulmonary disease   Obstructive sleep apnea   Sensorineural hearing loss, bilateral   Coronary atherosclerosis of native coronary artery   Major depressive disorder, recurrent episode, moderate   Presumed CHF (congestive heart failure) exacerbation -Cardiology following, continue IV diuresis -Echo last December reports normal EF but extremely poor quality, will repeat for further information and evaluation during this hospitalization -Continue Lasix 60 IV twice daily per cardiology, losartan ongoing   Coronary atherosclerosis of native coronary artery -AICD intact, no indication at this time to interrogate -Continue statin, nitroglycerin  Sensorineural hearing loss, bilateral Patient has hearing aids, he has been encouraged to wear these.   Obstructive sleep apnea Patient has CPAP at home which he reports to have only used "2 times" Continue to offer CPAP here patient will  comply   Major depressive disorder, recurrent episode, moderate -Continue Lexapro  Liver cell carcinoma Currently stable Patient last seen at [facility] and missed his last appointment for follow-up scans.   Gastroesophageal reflux disease Continue PPI   Right middle cerebral artery stroke On eliquis   Chronic atrial fibrillation Mild RVR  Notably elevated to as high as 160 this morning with minimal exertion  Cardiology following, placed on amiodarone drip, holding p.o. amiodarone in the interim On Eliquis   CAD (coronary artery disease) Status post AICD, no indication for interrogation at this time Continue Lipitor Nitroglycerin as needed   Hypothyroidism Continue levothyroxine   Tobacco abuse Continues to smoke daily, 3 cigarettes, offered nicotine patch but declined.  Discussed need for smoking cessation at length    Benign prostatic hyperplasia Continue finasteride Continue Flomax   Essential hypertension On no antihypertensives at present IV lopressor if needed   Diet-controlled diabetes mellitus, moderately well-controlled Continue Lantus Sliding scale insulin A1c 7.2  DVT prophylaxis: Eliquis Code Status: Stable Family Communication: None present  Status is: Inpatient  Dispo: The patient is from: Home              Anticipated d/c is to: Home              Anticipated d/c date is: 48 to 72 hours              Patient currently not medically stable for discharge  Consultants:  Cardiology  Procedures:  None  Antimicrobials:  None  Subjective: No acute issues or events overnight denies nausea vomiting diarrhea constipation any fevers chills or chest pain  Objective: Vitals:   09/21/22 0036 09/21/22 0258 09/21/22 0500 09/21/22 0519  BP: (!) 148/86 99/70  104/87  Pulse: 79 100  (!) 122  Resp: 18 17  17   Temp: 99.2 F (37.3 C) 98.6 F (37 C)  97.6 F (36.4 C)  TempSrc: Oral Oral  Oral  SpO2: 96% 96%  97%  Weight:   94.8 kg   Height:         Intake/Output Summary (Last 24 hours) at 09/21/2022 0737 Last data filed at 09/21/2022 0533 Gross per 24 hour  Intake 340 ml  Output 2375 ml  Net -2035 ml   Filed Weights   09/20/22 1220 09/21/22 0500  Weight: 97.5 kg 94.8 kg    Examination:  General:  Pleasantly resting in bed, No acute distress. HEENT:  Normocephalic atraumatic.  Sclerae nonicteric, noninjected.  Extraocular movements intact bilaterally. Neck:  Without mass or deformity.  Trachea is midline. Lungs: Bibasilar rales without overt wheeze or rhonchi. Heart: Irregularly irregular without overt murmur rubs or gallops. Abdomen:  Soft, nontender, nondistended.  Without guarding or rebound. Extremities: Without cyanosis, clubbing, or obvious deformity.  Data Reviewed: I have personally reviewed following labs and imaging studies  CBC: Recent Labs  Lab 09/20/22 1405 09/21/22 0532  WBC 7.1 6.6  NEUTROABS 4.8  --   HGB 11.1* 10.9*  HCT 36.4* 35.5*  MCV 88.1 87.4  PLT 217 0000000   Basic Metabolic Panel: Recent Labs  Lab 09/20/22 1405 09/21/22 0532  NA 136 137  K 3.9 3.4*  CL 106 104  CO2 22 26  GLUCOSE 136* 118*  BUN 13 14  CREATININE 1.06 1.13  CALCIUM 8.3* 8.4*   GFR: Estimated Creatinine Clearance: 63.7 mL/min (by C-G formula based on SCr of 1.13 mg/dL). Liver Function Tests: Recent Labs  Lab 09/20/22 1405  AST 20  ALT 14  ALKPHOS 48  BILITOT 0.5  PROT 6.5  ALBUMIN 3.7   No results for input(s): "LIPASE", "AMYLASE" in the last 168 hours. No results for input(s): "AMMONIA" in the last 168 hours. Coagulation Profile: No results for input(s): "INR", "PROTIME" in the last 168 hours. Cardiac Enzymes: No results for input(s): "CKTOTAL", "CKMB", "CKMBINDEX", "TROPONINI" in the last 168 hours. BNP (last 3 results) No results for input(s): "PROBNP" in the last 8760 hours. HbA1C: Recent Labs    09/20/22 1405  HGBA1C 7.2*   CBG: Recent Labs  Lab 09/20/22 1759 09/20/22 2015 09/21/22 0731   GLUCAP 136* 188* 121*   Lipid Profile: No results for input(s): "CHOL", "HDL", "LDLCALC", "TRIG", "CHOLHDL", "LDLDIRECT" in the last 72 hours. Thyroid Function Tests: No results for input(s): "TSH", "T4TOTAL", "FREET4", "T3FREE", "THYROIDAB" in the last 72 hours. Anemia Panel: No results for input(s): "VITAMINB12", "FOLATE", "FERRITIN", "TIBC", "IRON", "RETICCTPCT" in the last 72 hours. Sepsis Labs: No results for input(s): "PROCALCITON", "LATICACIDVEN" in the last 168 hours.  No results found for this or any previous visit (from the past 240 hour(s)).       Radiology Studies: DG Chest 2 View  Result Date: 09/20/2022 CLINICAL DATA:  Shortness of breath EXAM: CHEST - 2 VIEW COMPARISON:  06/14/2022 FINDINGS: Left-sided implanted cardiac device remains in place. Mild cardiomegaly. Aortic atherosclerosis. Pulmonary vascular congestion. Mild interstitial prominence. No focal consolidation. Trace pleural effusions. No pneumothorax. IMPRESSION: Findings suggestive of mild CHF with interstitial edema and trace pleural effusions. Electronically Signed   By: Davina Poke D.O.   On: 09/20/2022 13:14        Scheduled Meds:  amiodarone  200 mg Oral Daily   apixaban  5 mg Oral BID   aspirin  81  mg Oral Daily   atorvastatin  40 mg Oral QHS   escitalopram  10 mg Oral QHS   finasteride  5 mg Oral Daily   furosemide  60 mg Intravenous BID   insulin aspart  0-15 Units Subcutaneous TID WC   insulin glargine-yfgn  10 Units Subcutaneous QHS   levothyroxine  50 mcg Oral QAC breakfast   losartan  25 mg Oral Daily   pantoprazole  40 mg Oral BID   pregabalin  25 mg Oral BID   sodium chloride flush  3 mL Intravenous Q12H   tamsulosin  0.4 mg Oral QPC supper   Continuous Infusions:  sodium chloride       LOS: 0 days   Time spent: 65min  Little Ishikawa, DO [facility]  If 7PM-7AM, please contact night-coverage [email or web address]  09/21/2022, 7:37 AM

## 2022-09-22 ENCOUNTER — Inpatient Hospital Stay (HOSPITAL_COMMUNITY): Payer: No Typology Code available for payment source | Admitting: Anesthesiology

## 2022-09-22 ENCOUNTER — Encounter (HOSPITAL_COMMUNITY): Payer: Self-pay | Admitting: Family Medicine

## 2022-09-22 DIAGNOSIS — I509 Heart failure, unspecified: Secondary | ICD-10-CM | POA: Diagnosis not present

## 2022-09-22 LAB — CBC
HCT: 37.1 % — ABNORMAL LOW (ref 39.0–52.0)
Hemoglobin: 11.7 g/dL — ABNORMAL LOW (ref 13.0–17.0)
MCH: 26.9 pg (ref 26.0–34.0)
MCHC: 31.5 g/dL (ref 30.0–36.0)
MCV: 85.3 fL (ref 80.0–100.0)
Platelets: 223 10*3/uL (ref 150–400)
RBC: 4.35 MIL/uL (ref 4.22–5.81)
RDW: 13.8 % (ref 11.5–15.5)
WBC: 7.3 10*3/uL (ref 4.0–10.5)
nRBC: 0 % (ref 0.0–0.2)

## 2022-09-22 LAB — GLUCOSE, CAPILLARY
Glucose-Capillary: 126 mg/dL — ABNORMAL HIGH (ref 70–99)
Glucose-Capillary: 179 mg/dL — ABNORMAL HIGH (ref 70–99)
Glucose-Capillary: 127 mg/dL — ABNORMAL HIGH (ref 70–99)
Glucose-Capillary: 142 mg/dL — ABNORMAL HIGH (ref 70–99)

## 2022-09-22 LAB — BASIC METABOLIC PANEL
Anion gap: 11 (ref 5–15)
Anion gap: 10 (ref 5–15)
BUN: 14 mg/dL (ref 8–23)
BUN: 15 mg/dL (ref 8–23)
CO2: 27 mmol/L (ref 22–32)
CO2: 26 mmol/L (ref 22–32)
Calcium: 8.6 mg/dL — ABNORMAL LOW (ref 8.9–10.3)
Calcium: 8.7 mg/dL — ABNORMAL LOW (ref 8.9–10.3)
Chloride: 100 mmol/L (ref 98–111)
Chloride: 101 mmol/L (ref 98–111)
Creatinine, Ser: 1.25 mg/dL — ABNORMAL HIGH (ref 0.61–1.24)
Creatinine, Ser: 1.35 mg/dL — ABNORMAL HIGH (ref 0.61–1.24)
GFR, Estimated: 59 mL/min — ABNORMAL LOW (ref >60)
GFR, Estimated: 54 mL/min — ABNORMAL LOW (ref >60)
Glucose, Bld: 112 mg/dL — ABNORMAL HIGH (ref 70–99)
Glucose, Bld: 122 mg/dL — ABNORMAL HIGH (ref 70–99)
Potassium: 2.9 mmol/L — ABNORMAL LOW (ref 3.5–5.1)
Potassium: 3.9 mmol/L (ref 3.5–5.1)
Sodium: 138 mmol/L (ref 135–145)
Sodium: 137 mmol/L (ref 135–145)

## 2022-09-22 MED ORDER — POTASSIUM CHLORIDE CRYS ER 20 MEQ PO TBCR
40.0000 meq | EXTENDED_RELEASE_TABLET | Freq: Once | ORAL | Status: AC
  Administered 2022-09-22: 40 meq via ORAL
  Filled 2022-09-22: qty 2

## 2022-09-22 MED ORDER — POTASSIUM CHLORIDE 20 MEQ PO PACK
60.0000 meq | PACK | Freq: Once | ORAL | Status: AC
  Administered 2022-09-22: 60 meq via ORAL
  Filled 2022-09-22: qty 3

## 2022-09-22 MED ORDER — DAPAGLIFLOZIN PROPANEDIOL 10 MG PO TABS
10.0000 mg | ORAL_TABLET | Freq: Every day | ORAL | Status: DC
  Administered 2022-09-22 – 2022-09-26 (×5): 10 mg via ORAL
  Filled 2022-09-22 (×5): qty 1

## 2022-09-22 MED ORDER — POTASSIUM CHLORIDE CRYS ER 20 MEQ PO TBCR: 40.0000 meq | EXTENDED_RELEASE_TABLET | Freq: Once | ORAL | Status: DC

## 2022-09-22 MED ORDER — METOPROLOL TARTRATE 25 MG PO TABS
12.5000 mg | ORAL_TABLET | Freq: Two times a day (BID) | ORAL | Status: DC
  Administered 2022-09-22: 12.5 mg via ORAL
  Filled 2022-09-22: qty 1

## 2022-09-22 MED ORDER — PREGABALIN 75 MG PO CAPS
75.0000 mg | ORAL_CAPSULE | Freq: Two times a day (BID) | ORAL | Status: DC
  Administered 2022-09-22 – 2022-09-26 (×8): 75 mg via ORAL
  Filled 2022-09-22 (×8): qty 1

## 2022-09-22 NOTE — Progress Notes (Signed)
Rounding Note    Patient Name: Grant [name] Date of Encounter: 09/22/2022  [place] Cardiologist: None   Subjective   Patient reports mild improvement in respiratory status today. Denies chest pain and also notes that sensation of palpitations has decreased from yesterday.   Inpatient Medications    Scheduled Meds:  apixaban  5 mg Oral BID   aspirin  81 mg Oral Daily   atorvastatin  40 mg Oral QHS   escitalopram  10 mg Oral QHS   finasteride  5 mg Oral Daily   furosemide  60 mg Intravenous BID   insulin aspart  0-15 Units Subcutaneous TID WC   insulin glargine-yfgn  10 Units Subcutaneous QHS   levothyroxine  50 mcg Oral QAC breakfast   losartan  25 mg Oral Daily   pantoprazole  40 mg Oral BID   potassium chloride  40 mEq Oral Once   pregabalin  25 mg Oral BID   sodium chloride flush  3 mL Intravenous Q12H   tamsulosin  0.4 mg Oral QPC supper   Continuous Infusions:  sodium chloride     amiodarone 30 mg/hr (09/21/22 2309)   PRN Meds: sodium chloride, acetaminophen, albuterol, HYDROcodone-acetaminophen, metoprolol tartrate, nitroGLYCERIN, polyethylene glycol, sodium chloride flush   Vital Signs    Vitals:   09/21/22 2000 09/21/22 2200 09/22/22 0000 09/22/22 0132  BP:  107/75 103/85   Pulse:   (!) 112   Resp:   14 18  Temp: 97.6 F (36.4 C)  98 F (36.7 C)   TempSrc: Oral  Oral   SpO2:   100% 94%  Weight:      Height:        Intake/Output Summary (Last 24 hours) at 09/22/2022 0748 Last data filed at 09/22/2022 0100 Gross per 24 hour  Intake 909.68 ml  Output 2000 ml  Net -1090.32 ml      09/21/2022    5:00 AM 09/20/2022   12:20 PM 06/24/2022    5:30 AM  Last 3 Weights  Weight (lbs) 208 lb 15.9 oz 215 lb 217 lb 2.5 oz  Weight (kg) 94.8 kg 97.523 kg 98.5 kg      Telemetry    Afib with RVR, rates 100-140 - Personally Reviewed  ECG    No new tracing - Personally Reviewed  Physical Exam   GEN: No acute distress.   Neck: JVP  elevated 2-3cm above clavicle with HOB at 30 degrees Cardiac: irregularly irregular, no murmurs, rubs, or gallops.  Respiratory: Bibasilar crackles GI: Soft, nontender, non-distended  MS: No edema; No deformity. Neuro:  Nonfocal  Psych: Normal affect   Labs    High Sensitivity Troponin:  No results for input(s): "TROPONINIHS" in the last 720 hours.   Chemistry Recent Labs  Lab 09/20/22 1405 09/21/22 0532 09/22/22 0525  NA 136 137 138  K 3.9 3.4* 2.9*  CL 106 104 100  CO2 22 26 27   GLUCOSE 136* 118* 112*  BUN 13 14 14   CREATININE 1.06 1.13 1.25*  CALCIUM 8.3* 8.4* 8.6*  PROT 6.5  --   --   ALBUMIN 3.7  --   --   AST 20  --   --   ALT 14  --   --   ALKPHOS 48  --   --   BILITOT 0.5  --   --   GFRNONAA >60 >60 59*  ANIONGAP 8 7 11     Lipids No results for input(s): "CHOL", "TRIG", "HDL", "LABVLDL", "  [place]", "CHOLHDL" in the last 168 hours.  Hematology Recent Labs  Lab 09/20/22 1405 09/21/22 0532 09/22/22 0525  WBC 7.1 6.6 7.3  RBC 4.13* 4.06* 4.35  HGB 11.1* 10.9* 11.7*  HCT 36.4* 35.5* 37.1*  MCV 88.1 87.4 85.3  MCH 26.9 26.8 26.9  MCHC 30.5 30.7 31.5  RDW 14.1 13.9 13.8  PLT 217 193 223   Thyroid  Recent Labs  Lab 09/21/22 0846  TSH 3.950    BNP Recent Labs  Lab 09/20/22 1405  BNP 971.2*    DDimer No results for input(s): "DDIMER" in the last 168 hours.   Radiology    ECHOCARDIOGRAM COMPLETE  Result Date: 09/21/2022    ECHOCARDIOGRAM REPORT   Patient Name:   Grant [name] Date of Exam: 09/21/2022 Medical Rec #:  [id number]            Height:       74.0 in Accession #:    [id number]           Weight:       209.0 lb Date of Birth:  08/24/1945            BSA:          2.214 m Patient Age:    77 years             BP:           104/87 mmHg Patient Gender: M                    HR:           127 bpm. Exam Location:  Inpatient Procedure: 2D Echo, Color Doppler, Cardiac Doppler and Intracardiac            Opacification Agent Indications:    Eval for CHF   History:        Patient has prior history of Echocardiogram examinations, most                 recent 05/21/2022. CHF, Stroke; Risk Factors:Hypertension,                 Diabetes and Current Smoker.  Sonographer:    Rolla Etienne Referring Phys: [id number] Lily Kocher  Sonographer Comments: No subcostal window. Image acquisition challenging due to patient body habitus. IMPRESSIONS  1. Study is extremely technically difficult; worse that prior (2023). LVEF appears reduced, largely based on one diagnostic image. Consider TEE or CMR for evaluation when in rhythm.  2. Left ventricular ejection fraction, by estimation, is 35 to 40%. The left ventricle has moderately decreased function. Left ventricular endocardial border not optimally defined to evaluate regional wall motion. There is severe left ventricular hypertrophy. Left ventricular diastolic parameters are indeterminate.  3. Right ventricular systolic function is normal. The right ventricular size is not well visualized. Tricuspid regurgitation signal is inadequate for assessing PA pressure.  4. The mitral valve was not well visualized. No evidence of mitral valve regurgitation.  5. The aortic valve was not well visualized. Aortic valve regurgitation is not visualized. Comparison(s): Prior images reviewed side by side. FINDINGS  Left Ventricle: Left ventricular ejection fraction, by estimation, is 35 to 40%. The left ventricle has moderately decreased function. Left ventricular endocardial border not optimally defined to evaluate regional wall motion. Definity contrast agent was given IV to delineate the left ventricular endocardial borders. The left ventricular internal cavity size was normal in size. There is severe left ventricular hypertrophy. Left ventricular diastolic parameters are  indeterminate. Right Ventricle: The right ventricular size is not well visualized. Right vetricular wall thickness was not well visualized. Right ventricular systolic function is  normal. Tricuspid regurgitation signal is inadequate for assessing PA pressure. Left Atrium: Left atrial size was not well visualized. Right Atrium: Right atrial size was not well visualized. Pericardium: There is no evidence of pericardial effusion. Mitral Valve: The mitral valve was not well visualized. No evidence of mitral valve regurgitation. Tricuspid Valve: The tricuspid valve is grossly normal. Tricuspid valve regurgitation is not demonstrated. Aortic Valve: The aortic valve was not well visualized. Aortic valve regurgitation is not visualized. Pulmonic Valve: The pulmonic valve was not well visualized. Pulmonic valve regurgitation is not visualized. Aorta: The ascending aorta was not well visualized and the aortic root was not well visualized. IAS/Shunts: The atrial septum is grossly normal. Additional Comments: A device lead is visualized in the right ventricle and right atrium.  LEFT VENTRICLE PLAX 2D LVIDd:         5.20 cm LVIDs:         4.50 cm LV PW:         1.70 cm LV IVS:        1.50 cm LVOT diam:     2.30 cm LVOT Area:     4.15 cm  RIGHT VENTRICLE RV S prime:     12.80 cm/s TAPSE (M-mode): 2.3 cm LEFT ATRIUM         Index LA diam:    4.90 cm 2.21 cm/m   SHUNTS Systemic Diam: 2.30 cm Rudean Haskell MD Electronically signed by Rudean Haskell MD Signature Date/Time: 09/21/2022/5:11:41 PM    Final    DG Chest 2 View  Result Date: 09/20/2022 CLINICAL DATA:  Shortness of breath EXAM: CHEST - 2 VIEW COMPARISON:  06/14/2022 FINDINGS: Left-sided implanted cardiac device remains in place. Mild cardiomegaly. Aortic atherosclerosis. Pulmonary vascular congestion. Mild interstitial prominence. No focal consolidation. Trace pleural effusions. No pneumothorax. IMPRESSION: Findings suggestive of mild CHF with interstitial edema and trace pleural effusions. Electronically Signed   By: Davina Poke D.O.   On: 09/20/2022 13:14    Cardiac Studies   05/21/22 TTE   IMPRESSIONS     1. Extremely  limited due to poor sound wave transmission; LV function  appears to be preserved on definity images; suggest TEE or cardiac MRI to  better evaluate if clinically indicated.   2. Left ventricular ejection fraction, by estimation, is 60 to 65%. The  left ventricle has normal function. The left ventricle has no regional  wall motion abnormalities. Left ventricular diastolic parameters are  consistent with Grade I diastolic  dysfunction (impaired relaxation).   3. Right ventricular systolic function was not well visualized. The right  ventricular size is not well visualized.   4. The mitral valve was not well visualized. not well visualized mitral  valve regurgitation.   5. Tricuspid valve regurgitation not well visualized.   6. The aortic valve is normal in structure. Aortic valve regurgitation  Not well visualized.   7. Pulmonic valve regurgitation not well visualized.   8. Aortic dilatation noted. There is mild dilatation of the aortic root,  measuring 40 mm.   FINDINGS   Left Ventricle: Left ventricular ejection fraction, by estimation, is 60  to 65%. The left ventricle has normal function. The left ventricle has no  regional wall motion abnormalities. Definity contrast agent was given IV  to delineate the left ventricular   endocardial borders. The left ventricular internal cavity size  was normal  in size. Suboptimal image quality limits for assessment of left  ventricular hypertrophy. Left ventricular diastolic parameters are  consistent with Grade I diastolic dysfunction  (impaired relaxation).   Right Ventricle: The right ventricular size is not well visualized. Right  vetricular wall thickness was not well visualized. Right ventricular  systolic function was not well visualized.   Left Atrium: Left atrial size was not well visualized.   Right Atrium: Right atrial size was not well visualized.   Pericardium: There is no evidence of pericardial effusion.   Mitral Valve:  The mitral valve was not well visualized. Not well  visualized mitral valve regurgitation.   Tricuspid Valve: The tricuspid valve is grossly normal. Tricuspid valve  regurgitation not well visualized.   Aortic Valve: The aortic valve is normal in structure. Aortic valve  regurgitation Not well visualized.   Pulmonic Valve: The pulmonic valve was not well visualized. Pulmonic valve  regurgitation not well visualized.   Aorta: Aortic dilatation noted. There is mild dilatation of the aortic  root, measuring 40 mm.   IAS/Shunts: The interatrial septum was not well visualized.   Additional Comments: Extremely limited due to poor sound wave  transmission; LV function appears to be preserved on definity images;  suggest TEE or cardiac MRI to better evaluate if clinically indicated. A  device lead is visualized.   09/22/22 TTE  IMPRESSIONS     1. Study is extremely technically difficult; worse that prior (2023).  LVEF appears reduced, largely based on one diagnostic image. Consider TEE  or CMR for evaluation when in rhythm.   2. Left ventricular ejection fraction, by estimation, is 35 to 40%. The  left ventricle has moderately decreased function. Left ventricular  endocardial border not optimally defined to evaluate regional wall motion.  There is severe left ventricular  hypertrophy. Left ventricular diastolic parameters are indeterminate.   3. Right ventricular systolic function is normal. The right ventricular  size is not well visualized. Tricuspid regurgitation signal is inadequate  for assessing PA pressure.   4. The mitral valve was not well visualized. No evidence of mitral valve  regurgitation.   5. The aortic valve was not well visualized. Aortic valve regurgitation  is not visualized.   Comparison(s): Prior images reviewed side by side.   FINDINGS   Left Ventricle: Left ventricular ejection fraction, by estimation, is 35  to 40%. The left ventricle has moderately  decreased function. Left  ventricular endocardial border not optimally defined to evaluate regional  wall motion. Definity contrast agent  was given IV to delineate the left ventricular endocardial borders. The  left ventricular internal cavity size was normal in size. There is severe  left ventricular hypertrophy. Left ventricular diastolic parameters are  indeterminate.   Right Ventricle: The right ventricular size is not well visualized. Right  vetricular wall thickness was not well visualized. Right ventricular  systolic function is normal. Tricuspid regurgitation signal is inadequate  for assessing PA pressure.   Left Atrium: Left atrial size was not well visualized.   Right Atrium: Right atrial size was not well visualized.   Pericardium: There is no evidence of pericardial effusion.   Mitral Valve: The mitral valve was not well visualized. No evidence of  mitral valve regurgitation.   Tricuspid Valve: The tricuspid valve is grossly normal. Tricuspid valve  regurgitation is not demonstrated.   Aortic Valve: The aortic valve was not well visualized. Aortic valve  regurgitation is not visualized.   Pulmonic Valve: The pulmonic  valve was not well visualized. Pulmonic valve  regurgitation is not visualized.   Aorta: The ascending aorta was not well visualized and the aortic root was  not well visualized.   IAS/Shunts: The atrial septum is grossly normal.   Additional Comments: A device lead is visualized in the right ventricle  and right atrium.   Patient Profile     Grant [name] is a 77 y.o. male with a hx of  DVT, liver cancer (treatment at [facility] status post left liver lobectomy, radioembolization and cryoablation in August and September 2022) A-fib on chronic anticoagulation, right CVA, CHF, CAD with AICD, HLD, T2DM, HTN, tobacco use, CKD, hypothyroidism who is being seen for the evaluation of heart failure exacerbation at the request of Dr. Roderic Palau.   Assessment  & Plan    Acute CHF   Patient with 3 weeks of worsening dyspnea, found to be hypoxic with BNP of 971. Continues to receive IV lasix and is now net negative 3.275L with clinical improvement. Last echocardiogram from 05/21/22 with likely preserved EF (poor echo windows). Repeat imaging this admission was unfortunately similarly limited.    Continue IV diuresis this morning. Creatinine is now increasing, will repeat BMP this afternoon. If BUN/Creatinine continue to elevate, would hold afternoon lasix. Unclear what prompted this exacerbation. TSH WNL. I wonder if patient has been having RVR breakthrough at home. Will need to optimize HF GDMT prior to discharge and reinforce low salt diet.  Patient likely a good candidate for SGLT2 HF GDMT generally limited at this time by low BP.   CAD s/p ICD implant   Patient with initial ICD implantation in 2007 following what was reportedly polymorphic VT. Also underwent cardiac stenting.   Chronic atrial fibrillation CHA2DS2-VASc Score = 8    Patient remains in afib with RVR this morning. Continues to report sensation of palpitations, though notes improvement. Chronically takes amiodarone 200mg  and Eliquis.   Amiodarone infusion rate increased to 60mg /hr. Low normal BP precludes addition of beta blocker at this time Continue Eliquis   Per primary team:   Major depressive disorder GERD Right middle cerebral artery stroke Hypothyroidism BPH OSA Hypothyroidism DM type II     For questions or updates, please contact [place] Please consult [email or web address] for contact info under        Signed, Lily Kocher, PA-C  09/22/2022, 7:48 AM

## 2022-09-22 NOTE — Progress Notes (Signed)
Heart Failure Nurse Navigator Progress Note  PCP: [facility]Cardiologist[facility] [place] Ad[facility] Diagnosis: Systolic congestive heart failure Admitted from: Home via EMS  Presentation:   [name] presented with shortness of breath, mild swelling to BLE x 3 weeks, that became worse. BP 149/99, HR 91, BNP 971.2, IV lasix 40 mg given,  XR showed fluid overload and trace pleural effusions. Patient last ECHO 12/23 was 60-65%.   Navigator called and spoke with patient at [place] long, education was done on the sign and symptoms of heart failure, daily weights, patient stated he was going to have his wife buy a new scale for home use, education completed on when to call his doctor or go to the ED, diet/ fluid restrictions, patient reported to using salt frequently, and drinks about 64 oz of water daily. Taking all medications as prescribed and attending all medical appointments. Patient verbalized his understanding of education and appointment date/ time/ location was explained and included in AVS for discharge. A HF TOC appointment was scheduled for 10/08/2022 @ 2 pm.  ECHO/ LVEF: 35-40% HFrEF  Clinical Course:  Past Medical History:  Diagnosis Date   AICD (automatic cardioverter/defibrillator) present    BPH (benign prostatic hyperplasia)    CAD (coronary artery disease)    DM (diabetes mellitus)    HLD (hyperlipidemia)    Hyperglycemia 03/09/2017   Hypothyroidism    NSVT (nonsustained ventricular tachycardia)    Obstructive sleep apnea    Stroke      Social History   Socioeconomic History   Marital status: Married    Spouse name: Inez Catalina   Number of children: 4   Years of education: Not on file   Highest education level: GED or equivalent  Occupational History   Occupation: retired  Tobacco Use   Smoking status: Every Day    Packs/day: 0.80    Years: 40.00    Additional pack years: 0.00    Total pack years: 32.00    Types: Cigarettes    Smokeless tobacco: Current    Types: Chew  Vaping Use   Vaping Use: Former  Substance and Sexual Activity   Alcohol use: Yes    Alcohol/week: 0.0 standard drinks of alcohol    Comment: OCCASIONAL   Drug use: Not Currently    Types: Marijuana    Comment: in [place] during war   Sexual activity: Not on file  Other Topics Concern   Not on file  Social History Narrative   Not on file   Social Determinants of Health   Financial Resource Strain: Low Risk  (09/22/2022)   Overall Financial Resource Strain (CARDIA)    Difficulty of Paying Living Expenses: Not hard at all  Food Insecurity: No Food Insecurity (09/20/2022)   Hunger Vital Sign    Worried About Running Out of Food in the Last Year: Never true    Ran Out of Food in the Last Year: Never true  Transportation Needs: No Transportation Needs (09/22/2022)   PRAPARE - Hydrologist (Medical): No    Lack of Transportation (Non-Medical): No  Physical Activity: Not on file  Stress: Not on file  Social Connections: Not on file   Education Assessment and Provision:  Detailed education and instructions provided on heart failure disease management including the following:  Signs and symptoms of Heart Failure When to call the physician Importance of daily weights Low sodium diet Fluid restriction Medication management Anticipated future follow-up appointments  Patient education  given on each of the above topics.  Patient acknowledges understanding via teach back method and acceptance of all instructions.  Education Materials:  "Living Better With Heart Failure" Booklet, HF zone tool, & Daily Weight Tracker Tool.  Patient has scale at home: yes Patient has pill box at home: yes Education done verbally over the phone with patient at [place] for Readmission and/or Poor Patient Outcomes: Heart failure hospital admissions (last 6 months): 1  No Show rate: 4%  Difficult  social situation: No Demonstrates medication adherence: Yes Primary Language: English Literacy level: Reading, writing, and comprehension.  Barriers of Care:   Diet/ fluids restrictions ( salt intake) Daily weights  HF education Smoking cessation  Considerations/Referrals:   Referral made to Heart Failure Pharmacist Stewardship: Yes Referral made to Heart Failure CSW/NCM TOC: No Referral made to Heart & Vascular TOC clinic: Yes, 10/08/2022 @ 2 pm.  Items for Follow-up on DC/TOC: Diet/ fluid restrictions ( salt intake) Daily weights/ smoking cessation Continued HF Education    Earnestine Leys, BSN, RN Heart Failure Transport planner Only

## 2022-09-22 NOTE — TOC Progression Note (Addendum)
Transition of Care [facility]) - Progression Note    Patient Details  Name: Grant [name] MRN: [id number] Date of Birth: 08-24-1945  [facility] CM/SW Contact  Purcell Mouton, RN Phone Number: 09/22/2022, 10:35 AM  Clinical Narrative:    Spoke with pt concerning Home Health. Pt states that his wife is his caregiver, she took classes to care for him. Pt asked for O2 because he becomes SOB at home. Pt states he will weight himself sometimes and that he keep his appointments at the [place]. TOC will continue to follow up for home O2. Will need O2 sat and home O2 orders if needed. RN will give pt CHF booklet. Pt is active with the [facility] in [place], Dr. Sherral Hammers MD [phone], CSW [place].    Expected Discharge Plan: [place] Barriers to Discharge: No Barriers Identified  Expected Discharge Plan and Services       Living arrangements for the past 2 months: Single Family Home                                       Social Determinants of Health (SDOH) Interventions SDOH Screenings   Food Insecurity: No Food Insecurity (09/20/2022)  Housing: Low Risk  (09/20/2022)  Transportation Needs: No Transportation Needs (09/20/2022)  Utilities: Not At Risk (09/20/2022)  Tobacco Use: High Risk (09/20/2022)    Readmission Risk Interventions     No data to display

## 2022-09-22 NOTE — Evaluation (Signed)
Physical Therapy Evaluation Patient Details Name: Grant [name] MRN: [id number] DOB: 1946/02/15 Today's Date: 09/22/2022  History of Present Illness  Pt is a 77 y/o M admitted on 09/20/22 after presenting to the ED with c/o increasing SOB over the last 3 weeks. Pt is being treated for presumed CHF exacerbation. PMH: DVT, liver CA, a-fib on chronic anticoagulation, R CVA, CHF, CAD with AICD, HLD, DM2, HTN, tobacco use, CKD, hypothyroid  Clinical Impression  Pt seen for PT evaluation with pt agreeable to tx. Pt reports prior to admission he was mod I with cane, living with wife in 1 level home with 2 steps with L rail to enter. On this date, pt is able to complete supine>sit with mod I with HOB elevated & bed rails, STS from low bed with min assist, & ambulate with RW & CGA fade to supervision. Pt assists pt with threading pants on BLE in sitting (difficulty getting pants to slide on over non skid socks). Pt would benefit from ongoing PT tx to address balance, endurance, & gait with LRAD to reduce fall risk prior to return home.       Recommendations for follow up therapy are one component of a multi-disciplinary discharge planning process, led by the attending physician.  Recommendations may be updated based on patient status, additional functional criteria and insurance authorization.  Follow Up Recommendations       Assistance Recommended at Discharge Intermittent Supervision/Assistance  Patient can return home with the following  A little help with bathing/dressing/bathroom;A little help with walking and/or transfers;Assist for transportation;Help with stairs or ramp for entrance;Direct supervision/assist for medications management;Assistance with cooking/housework    Equipment Recommendations None recommended by PT  Recommendations for Other Services  OT consult    Functional Status Assessment Patient has had a recent decline in their functional status and demonstrates the ability to  make significant improvements in function in a reasonable and predictable amount of time.     Precautions / Restrictions Precautions Precautions: Fall Restrictions Weight Bearing Restrictions: No      Mobility  Bed Mobility Overal bed mobility: Modified Independent Bed Mobility: Supine to Sit     Supine to sit: Modified independent (Device/Increase time), HOB elevated     General bed mobility comments: supine>sit with HOB elevated, bed rails    Transfers Overall transfer level: Needs assistance Equipment used: Rolling walker (2 wheels) Transfers: Sit to/from Stand Sit to Stand: Min assist           General transfer comment: STS from EOB with min assist, pt with strong posterior lean onto EOB with BLE    Ambulation/Gait Ambulation/Gait assistance: Supervision, Min guard Gait Distance (Feet): 50 Feet Assistive device: Rolling walker (2 wheels) Gait Pattern/deviations: Decreased step length - right, Decreased step length - left, Decreased stride length Gait velocity: decreased        Stairs            Wheelchair Mobility    Modified Rankin (Stroke Patients Only)       Balance Overall balance assessment: Needs assistance Sitting-balance support: Feet supported Sitting balance-Leahy Scale: Good     Standing balance support: During functional activity, Bilateral upper extremity supported, Reliant on assistive device for balance Standing balance-Leahy Scale: Fair                               Pertinent Vitals/Pain Pain Assessment Pain Assessment: No/denies pain    Home Living  Family/patient expects to be discharged to:: Private residence Living Arrangements: Spouse/significant other Available Help at Discharge: Family;Available 24 hours/day Type of Home: House Home Access: Stairs to enter Entrance Stairs-Rails: Right Entrance Stairs-Number of Steps: 2   Home Layout: One level Home Equipment: Conservation officer, nature (2 wheels);Cane -  single point;Wheelchair - manual      Prior Function Prior Level of Function : Independent/Modified Independent             Mobility Comments: Pt reports he's ambulatory in the home with "walking cane", notes 1 fall in the past 6 months in the bathroom.       Hand Dominance        Extremity/Trunk Assessment   Upper Extremity Assessment Upper Extremity Assessment: Generalized weakness    Lower Extremity Assessment Lower Extremity Assessment: Generalized weakness       Communication   Communication: No difficulties  Cognition Arousal/Alertness: Awake/alert Behavior During Therapy: WFL for tasks assessed/performed Overall Cognitive Status: Within Functional Limits for tasks assessed                                          General Comments General comments (skin integrity, edema, etc.): Pt on 3L/min via nasal cannula with SpO2 >90%, HR 77-78 bpm. Pt endorsed dizziness upon STS but BP in standing in LUE: 96/70 mmHg MAP 77    Exercises Other Exercises Other Exercises: PT educated pt on use of incentive spirometer with good return demo.   Assessment/Plan    PT Assessment Patient needs continued PT services  PT Problem List Decreased strength;Cardiopulmonary status limiting activity;Decreased activity tolerance;Decreased knowledge of use of DME;Decreased balance;Decreased safety awareness;Decreased mobility       PT Treatment Interventions DME instruction;Therapeutic exercise;Gait training;Balance training;Neuromuscular re-education;Stair training;Functional mobility training;Therapeutic activities;Patient/family education    PT Goals (Current goals can be found in the Care Plan section)  Acute Rehab PT Goals Patient Stated Goal: get better PT Goal Formulation: With patient Time For Goal Achievement: 10/06/22 Potential to Achieve Goals: Good    Frequency Min 3X/week     Co-evaluation               AM-PAC PT "6 Clicks" Mobility   Outcome Measure Help needed turning from your back to your side while in a flat bed without using bedrails?: None Help needed moving from lying on your back to sitting on the side of a flat bed without using bedrails?: A Little Help needed moving to and from a bed to a chair (including a wheelchair)?: A Little Help needed standing up from a chair using your arms (e.g., wheelchair or bedside chair)?: A Little Help needed to walk in hospital room?: A Little Help needed climbing 3-5 steps with a railing? : A Little 6 Click Score: 19    End of Session Equipment Utilized During Treatment: Oxygen Activity Tolerance: Patient tolerated treatment well Patient left: in chair;with chair alarm set;with call bell/phone within reach Nurse Communication: Mobility status PT Visit Diagnosis: Muscle weakness (generalized) (M62.81);Difficulty in walking, not elsewhere classified (R26.2);Other abnormalities of gait and mobility (R26.89);Unsteadiness on feet (R26.81)    Time: [id number] PT Time Calculation (min) (ACUTE ONLY): 22 min   Charges:   PT Evaluation $PT Eval Moderate Complexity: 1 Mod PT Treatments $Therapeutic Activity: 8-22 mins        Lavone Nian, PT, DPT 09/22/22, 3:05 PM   Waunita Schooner 09/22/2022, 3:04  PM

## 2022-09-22 NOTE — Progress Notes (Addendum)
PROGRESS NOTE    [name]  [id number] DOB: Oct 10, 1945 DOA: 09/20/2022 PCP: [facility]   Brief Narrative:  Grant [name] is a 77 y.o. male with medical history significant of  DVT, liver cancer (treatment at [facility] status post left liver lobectomy, radioembolization and cryoablation in August and September 2022) A-fib on chronic anticoagulation, right CVA, CHF, CAD with AICD, HLD, T2DM, HTN, tobacco use, CKD, hypothyroid use who presents to the ED with increasing shortness of breath times the last 3 weeks.  Hospitalist called for admission, cardiology called in consult.  Assessment & Plan:   Principal Problem:   CHF (congestive heart failure) Active Problems:   Diet-controlled diabetes mellitus   Essential hypertension   Benign prostatic hyperplasia   Tobacco abuse   Hypothyroidism   CAD (coronary artery disease)   Chronic atrial fibrillation   Right middle cerebral artery stroke   Gastroesophageal reflux disease   Liver cell carcinoma   Moderate chronic obstructive pulmonary disease   Obstructive sleep apnea   Sensorineural hearing loss, bilateral   Coronary atherosclerosis of native coronary artery   Major depressive disorder, recurrent episode, moderate   Presumed CHF (congestive heart failure) exacerbation -Cardiology following, continue IV diuresis -Repeat echo still poor window but EF down 35 to 40% -Diuresing well - negative nearly 4L since admission  Chronic atrial fibrillation Mild RVR  Notably elevated to as high as 160 this morning with minimal exertion  Cardiology following, placed on amiodarone drip, holding p.o. amiodarone in the interim On Eliquis Cardiology will attempt cardioversion on 09/23/2022 and Was to rate control patient and improve his cardiac output given above heart failure exacerbation    Coronary atherosclerosis of native coronary artery -AICD intact, no indication at this time to interrogate -Continue statin,  nitroglycerin  Sensorineural hearing loss, bilateral Patient has hearing aids, he has been encouraged to wear these.   Obstructive sleep apnea Patient has CPAP at home which he reports to have only used "2 times" Continue to offer CPAP here patient will comply   Major depressive disorder, recurrent episode, moderate -Continue Lexapro  Liver cell carcinoma Currently stable Patient last seen at [facility] and missed his last appointment for follow-up scans.   Gastroesophageal reflux disease Continue PPI   Right middle cerebral artery stroke, history of On eliquis   CAD (coronary artery disease) Status post AICD, no indication for interrogation at this time Continue Lipitor Nitroglycerin as needed   Hypothyroidism Continue levothyroxine   Tobacco abuse Continues to smoke daily, 3 cigarettes, offered nicotine patch but declined.  Discussed need for smoking cessation at length    Benign prostatic hyperplasia Continue finasteride Continue Flomax   Essential hypertension On no antihypertensives at present IV lopressor if needed   Diet-controlled diabetes mellitus, moderately well-controlled Continue Lantus Sliding scale insulin A1c 7.2  DVT prophylaxis: Eliquis Code Status: Stable Family Communication: None present  Status is: Inpatient  Dispo: The patient is from: Home              Anticipated d/c is to: Home              Anticipated d/c date is: 48 to 72 hours              Patient currently not medically stable for discharge  Consultants:  Cardiology  Procedures:  None  Antimicrobials:  None  Subjective: No acute issues or events overnight denies nausea vomiting diarrhea constipation any fevers chills or chest pain  Objective: Vitals:  09/21/22 2000 09/21/22 2200 09/22/22 0000 09/22/22 0132  BP:  107/75 103/85   Pulse:   (!) 112   Resp:   14 18  Temp: 97.6 F (36.4 C)  98 F (36.7 C)   TempSrc: Oral  Oral   SpO2:   100% 94%  Weight:      Height:         Intake/Output Summary (Last 24 hours) at 09/22/2022 0745 Last data filed at 09/22/2022 0100 Gross per 24 hour  Intake 909.68 ml  Output 2000 ml  Net -1090.32 ml    Filed Weights   09/20/22 1220 09/21/22 0500  Weight: 97.5 kg 94.8 kg    Examination:  General:  Pleasantly resting in bed, No acute distress. HEENT:  Normocephalic atraumatic.  Sclerae nonicteric, noninjected.  Extraocular movements intact bilaterally. Neck:  Without mass or deformity.  Trachea is midline. Lungs: Bibasilar rales without overt wheeze or rhonchi. Heart: Irregularly irregular without overt murmur rubs or gallops. Abdomen:  Soft, nontender, nondistended.  Without guarding or rebound. Extremities: Without cyanosis, clubbing, or obvious deformity.  Data Reviewed: I have personally reviewed following labs and imaging studies  CBC: Recent Labs  Lab 09/20/22 1405 09/21/22 0532 09/22/22 0525  WBC 7.1 6.6 7.3  NEUTROABS 4.8  --   --   HGB 11.1* 10.9* 11.7*  HCT 36.4* 35.5* 37.1*  MCV 88.1 87.4 85.3  PLT 217 193 [id number]    Basic Metabolic Panel: Recent Labs  Lab 09/20/22 1405 09/21/22 0532 09/22/22 0525  NA 136 137 138  K 3.9 3.4* 2.9*  CL 106 104 100  CO2 22 26 27   GLUCOSE 136* 118* 112*  BUN 13 14 14   CREATININE 1.06 1.13 1.25*  CALCIUM 8.3* 8.4* 8.6*    GFR: Estimated Creatinine Clearance: 57.5 mL/min (A) (by C-G formula based on SCr of 1.25 mg/dL (H)).  Liver Function Tests: Recent Labs  Lab 09/20/22 1405  AST 20  ALT 14  ALKPHOS 48  BILITOT 0.5  PROT 6.5  ALBUMIN 3.7    HbA1C: Recent Labs    09/20/22 1405  HGBA1C 7.2*    CBG: Recent Labs  Lab 09/20/22 2015 09/21/22 0731 09/21/22 1131 09/21/22 1709 09/21/22 2012  GLUCAP 188* 121* 128* 173* 120*    Thyroid Function Tests: Recent Labs    09/21/22 0846  TSH 3.950   No results found for this or any previous visit (from the past 240 hour(s)).   Radiology Studies: ECHOCARDIOGRAM COMPLETE  Result Date:  09/21/2022    ECHOCARDIOGRAM REPORT   Patient Name:   Grant [name] Hosley Date of Exam: 09/21/2022 Medical Rec #:  [id number]            Height:       74.0 in Accession #:    [id number]           Weight:       209.0 lb Date of Birth:  Jul 22, 1945            BSA:          2.214 m Patient Age:    77 years             BP:           104/87 mmHg Patient Gender: M                    HR:           127 bpm. Exam Location:  Inpatient Procedure: 2D Echo, Color  Doppler, Cardiac Doppler and Intracardiac            Opacification Agent Indications:    Eval for CHF  History:        Patient has prior history of Echocardiogram examinations, most                 recent 05/21/2022. CHF, Stroke; Risk Factors:Hypertension,                 Diabetes and Current Smoker.  Sonographer:    Rolla Etienne Referring Phys: [id number] Lily Kocher  Sonographer Comments: No subcostal window. Image acquisition challenging due to patient body habitus. IMPRESSIONS  1. Study is extremely technically difficult; worse that prior (2023). LVEF appears reduced, largely based on one diagnostic image. Consider TEE or CMR for evaluation when in rhythm.  2. Left ventricular ejection fraction, by estimation, is 35 to 40%. The left ventricle has moderately decreased function. Left ventricular endocardial border not optimally defined to evaluate regional wall motion. There is severe left ventricular hypertrophy. Left ventricular diastolic parameters are indeterminate.  3. Right ventricular systolic function is normal. The right ventricular size is not well visualized. Tricuspid regurgitation signal is inadequate for assessing PA pressure.  4. The mitral valve was not well visualized. No evidence of mitral valve regurgitation.  5. The aortic valve was not well visualized. Aortic valve regurgitation is not visualized. Comparison(s): Prior images reviewed side by side. FINDINGS  Left Ventricle: Left ventricular ejection fraction, by estimation, is 35 to 40%. The left  ventricle has moderately decreased function. Left ventricular endocardial border not optimally defined to evaluate regional wall motion. Definity contrast agent was given IV to delineate the left ventricular endocardial borders. The left ventricular internal cavity size was normal in size. There is severe left ventricular hypertrophy. Left ventricular diastolic parameters are indeterminate. Right Ventricle: The right ventricular size is not well visualized. Right vetricular wall thickness was not well visualized. Right ventricular systolic function is normal. Tricuspid regurgitation signal is inadequate for assessing PA pressure. Left Atrium: Left atrial size was not well visualized. Right Atrium: Right atrial size was not well visualized. Pericardium: There is no evidence of pericardial effusion. Mitral Valve: The mitral valve was not well visualized. No evidence of mitral valve regurgitation. Tricuspid Valve: The tricuspid valve is grossly normal. Tricuspid valve regurgitation is not demonstrated. Aortic Valve: The aortic valve was not well visualized. Aortic valve regurgitation is not visualized. Pulmonic Valve: The pulmonic valve was not well visualized. Pulmonic valve regurgitation is not visualized. Aorta: The ascending aorta was not well visualized and the aortic root was not well visualized. IAS/Shunts: The atrial septum is grossly normal. Additional Comments: A device lead is visualized in the right ventricle and right atrium.  LEFT VENTRICLE PLAX 2D LVIDd:         5.20 cm LVIDs:         4.50 cm LV PW:         1.70 cm LV IVS:        1.50 cm LVOT diam:     2.30 cm LVOT Area:     4.15 cm  RIGHT VENTRICLE RV S prime:     12.80 cm/s TAPSE (M-mode): 2.3 cm LEFT ATRIUM         Index LA diam:    4.90 cm 2.21 cm/m   SHUNTS Systemic Diam: 2.30 cm Rudean Haskell MD Electronically signed by Rudean Haskell MD Signature Date/Time: 09/21/2022/5:11:41 PM    Final    DG Chest  2 View  Result Date:  09/20/2022 CLINICAL DATA:  Shortness of breath EXAM: CHEST - 2 VIEW COMPARISON:  06/14/2022 FINDINGS: Left-sided implanted cardiac device remains in place. Mild cardiomegaly. Aortic atherosclerosis. Pulmonary vascular congestion. Mild interstitial prominence. No focal consolidation. Trace pleural effusions. No pneumothorax. IMPRESSION: Findings suggestive of mild CHF with interstitial edema and trace pleural effusions. Electronically Signed   By: Davina Poke D.O.   On: 09/20/2022 13:14    Scheduled Meds:  apixaban  5 mg Oral BID   aspirin  81 mg Oral Daily   atorvastatin  40 mg Oral QHS   escitalopram  10 mg Oral QHS   finasteride  5 mg Oral Daily   furosemide  60 mg Intravenous BID   insulin aspart  0-15 Units Subcutaneous TID WC   insulin glargine-yfgn  10 Units Subcutaneous QHS   levothyroxine  50 mcg Oral QAC breakfast   losartan  25 mg Oral Daily   pantoprazole  40 mg Oral BID   potassium chloride  40 mEq Oral Once   pregabalin  25 mg Oral BID   sodium chloride flush  3 mL Intravenous Q12H   tamsulosin  0.4 mg Oral QPC supper   Continuous Infusions:  sodium chloride     amiodarone 30 mg/hr (09/21/22 2309)     LOS: 1 day   Time spent: 47min  Deriona Altemose C Leasa Kincannon, DO [facility]  If 7PM-7AM, please contact night-coverage [email or web address]  09/22/2022, 7:45 AM

## 2022-09-22 NOTE — Anesthesia Preprocedure Evaluation (Signed)
Anesthesia Evaluation    Airway        Dental   Pulmonary Current Smoker          Cardiovascular hypertension, + CAD and +CHF  + dysrhythmias Atrial Fibrillation + Cardiac Defibrillator   09/21/2022 ECHO: EF 35-40%, moderately decreased LVF, severe LVH, normal RVF, no significant valvular abnormalities   Neuro/Psych    Depression    CVA    GI/Hepatic ,GERD  Medicated,,(+) Hepatitis -, C  Endo/Other  diabetes, Insulin Dependent    Renal/GU Renal InsufficiencyRenal disease     Musculoskeletal   Abdominal   Peds  Hematology eliquis   Anesthesia Other Findings   Reproductive/Obstetrics                              Anesthesia Physical Anesthesia Plan  ASA: 4  Anesthesia Plan: General   Post-op Pain Management: Minimal or no pain anticipated   Induction: Intravenous  PONV Risk Score and Plan: 1 and Treatment may vary due to age or medical condition  Airway Management Planned: Natural Airway and Mask  Additional Equipment: None  Intra-op Plan:   Post-operative Plan:   Informed Consent:   Plan Discussed with:   Anesthesia Plan Comments:          Anesthesia Quick Evaluation

## 2022-09-23 ENCOUNTER — Encounter (HOSPITAL_COMMUNITY)
Admission: EM | Disposition: A | Discharge status: Home Health | Payer: Self-pay | Source: Home / Self Care | Attending: Internal Medicine

## 2022-09-23 DIAGNOSIS — I482 Chronic atrial fibrillation, unspecified: Secondary | ICD-10-CM | POA: Diagnosis not present

## 2022-09-23 DIAGNOSIS — I509 Heart failure, unspecified: Secondary | ICD-10-CM | POA: Diagnosis not present

## 2022-09-23 DIAGNOSIS — I5033 Acute on chronic diastolic (congestive) heart failure: Secondary | ICD-10-CM | POA: Diagnosis not present

## 2022-09-23 LAB — GLUCOSE, CAPILLARY
Glucose-Capillary: 123 mg/dL — ABNORMAL HIGH (ref 70–99)
Glucose-Capillary: 120 mg/dL — ABNORMAL HIGH (ref 70–99)
Glucose-Capillary: 115 mg/dL — ABNORMAL HIGH (ref 70–99)
Glucose-Capillary: 157 mg/dL — ABNORMAL HIGH (ref 70–99)

## 2022-09-23 LAB — CBC
HCT: 36.1 % — ABNORMAL LOW (ref 39.0–52.0)
Hemoglobin: 11.3 g/dL — ABNORMAL LOW (ref 13.0–17.0)
MCH: 26.8 pg (ref 26.0–34.0)
MCHC: 31.3 g/dL (ref 30.0–36.0)
MCV: 85.5 fL (ref 80.0–100.0)
Platelets: 215 10*3/uL (ref 150–400)
RBC: 4.22 MIL/uL (ref 4.22–5.81)
RDW: 13.9 % (ref 11.5–15.5)
WBC: 8.7 10*3/uL (ref 4.0–10.5)
nRBC: 0 % (ref 0.0–0.2)

## 2022-09-23 LAB — BASIC METABOLIC PANEL
Anion gap: 11 (ref 5–15)
BUN: 21 mg/dL (ref 8–23)
CO2: 26 mmol/L (ref 22–32)
Calcium: 8.5 mg/dL — ABNORMAL LOW (ref 8.9–10.3)
Chloride: 99 mmol/L (ref 98–111)
Creatinine, Ser: 1.61 mg/dL — ABNORMAL HIGH (ref 0.61–1.24)
GFR, Estimated: 44 mL/min — ABNORMAL LOW (ref >60)
Glucose, Bld: 124 mg/dL — ABNORMAL HIGH (ref 70–99)
Potassium: 3.6 mmol/L (ref 3.5–5.1)
Sodium: 136 mmol/L (ref 135–145)

## 2022-09-23 SURGERY — CARDIOVERSION
Anesthesia: Choice

## 2022-09-23 MED ORDER — AMIODARONE HCL 400 MG PO TABS: ORAL_TABLET | ORAL | 0 refills | Status: DC

## 2022-09-23 MED ORDER — FUROSEMIDE 40 MG PO TABS
40.0000 mg | ORAL_TABLET | Freq: Every day | ORAL | Status: DC
  Administered 2022-09-24 – 2022-09-26 (×3): 40 mg via ORAL
  Filled 2022-09-23 (×3): qty 1

## 2022-09-23 MED ORDER — AMIODARONE HCL 400 MG PO TABS: 400.0000 mg | ORAL_TABLET | Freq: Two times a day (BID) | ORAL | 1 refills | Status: DC

## 2022-09-23 MED ORDER — PREGABALIN 75 MG PO CAPS: 75.0000 mg | ORAL_CAPSULE | Freq: Two times a day (BID) | ORAL | 1 refills | Status: DC

## 2022-09-23 MED ORDER — PROPOFOL 10 MG/ML IV BOLUS
INTRAVENOUS | Status: AC
  Filled 2022-09-23: qty 20

## 2022-09-23 MED ORDER — DEXAMETHASONE SODIUM PHOSPHATE 10 MG/ML IJ SOLN
INTRAMUSCULAR | Status: AC
  Filled 2022-09-23: qty 1

## 2022-09-23 MED ORDER — ALBUMIN HUMAN 5 % IV SOLN
INTRAVENOUS | Status: AC
  Filled 2022-09-23: qty 250

## 2022-09-23 MED ORDER — LOSARTAN POTASSIUM 25 MG PO TABS: 25.0000 mg | ORAL_TABLET | Freq: Every day | ORAL | 1 refills | Status: DC

## 2022-09-23 MED ORDER — DAPAGLIFLOZIN PROPANEDIOL 10 MG PO TABS: 10.0000 mg | ORAL_TABLET | Freq: Every day | ORAL | 1 refills | Status: DC

## 2022-09-23 MED ORDER — AMIODARONE HCL 200 MG PO TABS
400.0000 mg | ORAL_TABLET | Freq: Two times a day (BID) | ORAL | Status: DC
  Administered 2022-09-23 – 2022-09-26 (×7): 400 mg via ORAL
  Filled 2022-09-23 (×7): qty 2

## 2022-09-23 MED ORDER — PHENYLEPHRINE 80 MCG/ML (10ML) SYRINGE FOR IV PUSH (FOR BLOOD PRESSURE SUPPORT)
PREFILLED_SYRINGE | INTRAVENOUS | Status: AC
  Filled 2022-09-23: qty 10

## 2022-09-23 MED ORDER — FENTANYL CITRATE (PF) 100 MCG/2ML IJ SOLN
INTRAMUSCULAR | Status: AC
  Filled 2022-09-23: qty 2

## 2022-09-23 MED ORDER — ROCURONIUM BROMIDE 10 MG/ML (PF) SYRINGE
PREFILLED_SYRINGE | INTRAVENOUS | Status: AC
  Filled 2022-09-23: qty 10

## 2022-09-23 MED ORDER — AMIODARONE HCL 200 MG PO TABS: ORAL_TABLET | ORAL | 0 refills | Status: DC

## 2022-09-23 MED ORDER — FUROSEMIDE 40 MG PO TABS: 40.0000 mg | ORAL_TABLET | Freq: Every day | ORAL | 1 refills | Status: DC

## 2022-09-23 MED ORDER — SUCCINYLCHOLINE CHLORIDE 200 MG/10ML IV SOSY
PREFILLED_SYRINGE | INTRAVENOUS | Status: AC
  Filled 2022-09-23: qty 10

## 2022-09-23 MED ORDER — ONDANSETRON HCL 4 MG/2ML IJ SOLN
INTRAMUSCULAR | Status: AC
  Filled 2022-09-23: qty 2

## 2022-09-23 MED ORDER — LIDOCAINE HCL (PF) 2 % IJ SOLN
INTRAMUSCULAR | Status: AC
  Filled 2022-09-23: qty 5

## 2022-09-23 MED ORDER — ETOMIDATE 2 MG/ML IV SOLN
INTRAVENOUS | Status: AC
  Filled 2022-09-23: qty 10

## 2022-09-23 NOTE — TOC Progression Note (Addendum)
Transition of Care [facility](TOC) - Progression Note    Patient Details  Name: Grant [name] MRN: [id number] Date of Birth: [birth year]  Transition of Care [facility](TOC) CM/SW Contact  Adrian ProwsKeller, Tarin Johndrow Katrice, RN Phone Number: 09/23/2022, 5:17 PM  Clinical Narrative:    Spoke w/ patient's wife in room at her request; she says pt needs a small carry-on oxygen, the wife said she can't handle a rolling tank and patient uses a walker/cane at home; explained not sure what [facility]; pt also has orders for HHPT; ? OT eval per PT; Dr Natale MilchLancaster notified; awaiting orders; [facility] [place] [facility]; CSW is Theatre stage managerKatlyn; will fax orders when determined if OT consult is need; also [facility]; awaiting response  [phone]- notified by Mayra ReelAmy Hyatt at [facility], agency [place]; [place] and [place] for [place].  Expected Discharge Plan: Home w Home Health Services Barriers to Discharge: No Barriers Identified  Expected Discharge Plan and Services       Living arrangements for the past 2 months: Single Family Home                                       Social Determinants of Health (SDOH) Interventions SDOH Screenings   Food Insecurity: No Food Insecurity (09/20/2022)  Housing: Low Risk  (09/22/2022)  Transportation Needs: No Transportation Needs (09/22/2022)  Utilities: Not At Risk (09/20/2022)  Alcohol Screen: Low Risk  (09/22/2022)  Financial Resource Strain: Low Risk  (09/22/2022)  Tobacco Use: High Risk (09/22/2022)    Readmission Risk Interventions     No data to display

## 2022-09-23 NOTE — Progress Notes (Addendum)
SATURATION QUALIFICATIONS: (This note is used to comply with regulatory documentation for home oxygen)  Patient Saturations on Room Air at Rest = 96%  Patient Saturations on Room Air while Ambulating = 82%  Patient Saturations on 3 Liters of oxygen while Ambulating = 93%  Please briefly explain why patient needs home oxygen: Patient continues to desat with exertion on room air.

## 2022-09-23 NOTE — Progress Notes (Signed)
Patient waiting for home oxygen set-up through the [place]. Dr. Avon Gully made aware and patient/wife updated.

## 2022-09-23 NOTE — Progress Notes (Signed)
Nutrition Note  Patient identified on the Malnutrition Screening Tool (MST) Report  Patient admitted with CHF. Reports feeling better today. Reports good appetite but states he has been eating foods he shouldn't PTA like bacon, sausage, gravy biscuits.  Reviewed low sodium diet with patient.  Placed handouts in AVS.  Answered patient's diet related questions.  Weight trending down r/t fluid loss from diuresis.  Wt Readings from Last 15 Encounters:  09/23/22 93.5 kg  06/24/22 98.5 kg  05/21/22 99.8 kg  03/08/18 104.8 kg  03/09/17 106.5 kg  01/19/17 109.3 kg  10/19/16 105.7 kg  08/16/16 106.9 kg  07/01/16 105.1 kg  09/10/15 106.4 kg  06/05/15 108.9 kg  05/06/15 106.3 kg  04/18/15 105.8 kg  03/04/15 105.8 kg  07/05/14 103.9 kg    Body mass index is 26.47 kg/m. Patient meets criteria for normal based on current BMI.   Current diet order is Heart healthy/ CHO modified, patient is consuming approximately 50% of meals at this time. Labs and medications reviewed.   If nutrition issues arise, please consult RD.   Grant Bibles, MS, RD, LDN Inpatient Clinical Dietitian Contact information available via Amion

## 2022-09-23 NOTE — TOC Progression Note (Signed)
Transition of Care [facility]) - Progression Note   Patient Details  Name: Grant [name] MRN: [id number] Date of Birth: [birth year]  Transition of Care [facility]) CM/SW Contact  Henrietta Dine, RN Phone Number: 09/23/2022, 5:15 PM  Clinical Narrative:     TOC order received for Heart Failure Home Health Screening. Patient has been reviewed and does not meet criteria.    Expected Discharge Plan: [place] Barriers to Discharge: No Barriers Identified  Expected Discharge Plan and Services       Living arrangements for the past 2 months: Single Family Home                                       Social Determinants of Health (SDOH) Interventions SDOH Screenings   Food Insecurity: No Food Insecurity (09/20/2022)  Housing: Low Risk  (09/22/2022)  Transportation Needs: No Transportation Needs (09/22/2022)  Utilities: Not At Risk (09/20/2022)  Alcohol Screen: Low Risk  (09/22/2022)  Financial Resource Strain: Low Risk  (09/22/2022)  Tobacco Use: High Risk (09/22/2022)    Readmission Risk Interventions     No data to display

## 2022-09-23 NOTE — Progress Notes (Signed)
Rounding Note    Patient Name: Grant [name] Date of Encounter: 09/23/2022  [place] Cardiologist: None   Subjective   Patient reports that he is feeling much better this morning back in NSR. Denies palpitations and chest pain. His breathing is improved though states that when oxygen is turned off, he still feels short of breath.  Inpatient Medications    Scheduled Meds:  amiodarone  400 mg Oral BID   apixaban  5 mg Oral BID   atorvastatin  40 mg Oral QHS   dapagliflozin propanediol  10 mg Oral Daily   escitalopram  10 mg Oral QHS   finasteride  5 mg Oral Daily   furosemide  60 mg Intravenous BID   insulin aspart  0-15 Units Subcutaneous TID WC   insulin glargine-yfgn  10 Units Subcutaneous QHS   levothyroxine  50 mcg Oral QAC breakfast   losartan  25 mg Oral Daily   metoprolol tartrate  12.5 mg Oral BID   pantoprazole  40 mg Oral BID   pregabalin  75 mg Oral BID   sodium chloride flush  3 mL Intravenous Q12H   tamsulosin  0.4 mg Oral QPC supper   Continuous Infusions:  sodium chloride     PRN Meds: sodium chloride, acetaminophen, albuterol, HYDROcodone-acetaminophen, metoprolol tartrate, nitroGLYCERIN, polyethylene glycol, sodium chloride flush   Vital Signs    Vitals:   09/22/22 2345 09/23/22 0046 09/23/22 0130 09/23/22 0500  BP: (!) 80/60 (!) 79/63 (!) 82/68   Pulse: 70 70 73   Resp: 19 17 18    Temp:      TempSrc:      SpO2: 100% 97% 97%   Weight:    93.5 kg  Height:        Intake/Output Summary (Last 24 hours) at 09/23/2022 0759 Last data filed at 09/22/2022 2100 Gross per 24 hour  Intake 240 ml  Output 1575 ml  Net -1335 ml      09/23/2022    5:00 AM 09/22/2022    8:14 AM 09/21/2022    5:00 AM  Last 3 Weights  Weight (lbs) 206 lb 2.1 oz 206 lb 2.1 oz 208 lb 15.9 oz  Weight (kg) 93.5 kg 93.5 kg 94.8 kg      Telemetry    Sinus rhythm since about 1340 on 4/3. Long PR interval - Personally Reviewed  ECG    No new tracing -  Personally Reviewed  Physical Exam   GEN: No acute distress.   Neck: No JVD Cardiac: RRR, no murmurs, rubs, or gallops.  Respiratory: Clear to auscultation bilaterally. GI: Soft, nontender, non-distended  MS: No edema; No deformity. Neuro:  Nonfocal  Psych: Normal affect   Labs    High Sensitivity Troponin:  No results for input(s): "TROPONINIHS" in the last 720 hours.   Chemistry Recent Labs  Lab 09/20/22 1405 09/21/22 0532 09/22/22 0525 09/22/22 1514 09/23/22 0505  NA 136   < > 138 137 136  K 3.9   < > 2.9* 3.9 3.6  CL 106   < > 100 101 99  CO2 22   < > 27 26 26   GLUCOSE 136*   < > 112* 122* 124*  BUN 13   < > 14 15 21   CREATININE 1.06   < > 1.25* 1.35* 1.61*  CALCIUM 8.3*   < > 8.6* 8.7* 8.5*  PROT 6.5  --   --   --   --   ALBUMIN 3.7  --   --   --   --  AST 20  --   --   --   --   ALT 14  --   --   --   --   ALKPHOS 48  --   --   --   --   BILITOT 0.5  --   --   --   --   GFRNONAA >60   < > 59* 54* 44*  ANIONGAP 8   < > 11 10 11    < > = values in this interval not displayed.    Lipids No results for input(s): "CHOL", "TRIG", "HDL", "LABVLDL", "LDLCALC", "CHOLHDL" in the last 168 hours.  Hematology Recent Labs  Lab 09/21/22 0532 09/22/22 0525 09/23/22 0505  WBC 6.6 7.3 8.7  RBC 4.06* 4.35 4.22  HGB 10.9* 11.7* 11.3*  HCT 35.5* 37.1* 36.1*  MCV 87.4 85.3 85.5  MCH 26.8 26.9 26.8  MCHC 30.7 31.5 31.3  RDW 13.9 13.8 13.9  PLT 193 223 215   Thyroid  Recent Labs  Lab 09/21/22 0846  TSH 3.950    BNP Recent Labs  Lab 09/20/22 1405  BNP 971.2*    DDimer No results for input(s): "DDIMER" in the last 168 hours.   Radiology    ECHOCARDIOGRAM COMPLETE  Result Date: 09/21/2022    ECHOCARDIOGRAM REPORT   Patient Name:   Grant [name] Date of Exam: 09/21/2022 Medical Rec #:  [id number]            Height:       74.0 in Accession #:    [id number]           Weight:       209.0 lb Date of Birth:  08-23-1945            BSA:          2.214 m Patient Age:     77 years             BP:           104/87 mmHg Patient Gender: M                    HR:           127 bpm. Exam Location:  Inpatient Procedure: 2D Echo, Color Doppler, Cardiac Doppler and Intracardiac            Opacification Agent Indications:    Eval for CHF  History:        Patient has prior history of Echocardiogram examinations, most                 recent 05/21/2022. CHF, Stroke; Risk Factors:Hypertension,                 Diabetes and Current Smoker.  Sonographer:    Rolla Etienne Referring Phys: [id number] Lily Kocher  Sonographer Comments: No subcostal window. Image acquisition challenging due to patient body habitus. IMPRESSIONS  1. Study is extremely technically difficult; worse that prior (2023). LVEF appears reduced, largely based on one diagnostic image. Consider TEE or CMR for evaluation when in rhythm.  2. Left ventricular ejection fraction, by estimation, is 35 to 40%. The left ventricle has moderately decreased function. Left ventricular endocardial border not optimally defined to evaluate regional wall motion. There is severe left ventricular hypertrophy. Left ventricular diastolic parameters are indeterminate.  3. Right ventricular systolic function is normal. The right ventricular size is not well visualized. Tricuspid regurgitation signal is inadequate for assessing PA pressure.  4.  The mitral valve was not well visualized. No evidence of mitral valve regurgitation.  5. The aortic valve was not well visualized. Aortic valve regurgitation is not visualized. Comparison(s): Prior images reviewed side by side. FINDINGS  Left Ventricle: Left ventricular ejection fraction, by estimation, is 35 to 40%. The left ventricle has moderately decreased function. Left ventricular endocardial border not optimally defined to evaluate regional wall motion. Definity contrast agent was given IV to delineate the left ventricular endocardial borders. The left ventricular internal cavity size was normal in size. There  is severe left ventricular hypertrophy. Left ventricular diastolic parameters are indeterminate. Right Ventricle: The right ventricular size is not well visualized. Right vetricular wall thickness was not well visualized. Right ventricular systolic function is normal. Tricuspid regurgitation signal is inadequate for assessing PA pressure. Left Atrium: Left atrial size was not well visualized. Right Atrium: Right atrial size was not well visualized. Pericardium: There is no evidence of pericardial effusion. Mitral Valve: The mitral valve was not well visualized. No evidence of mitral valve regurgitation. Tricuspid Valve: The tricuspid valve is grossly normal. Tricuspid valve regurgitation is not demonstrated. Aortic Valve: The aortic valve was not well visualized. Aortic valve regurgitation is not visualized. Pulmonic Valve: The pulmonic valve was not well visualized. Pulmonic valve regurgitation is not visualized. Aorta: The ascending aorta was not well visualized and the aortic root was not well visualized. IAS/Shunts: The atrial septum is grossly normal. Additional Comments: A device lead is visualized in the right ventricle and right atrium.  LEFT VENTRICLE PLAX 2D LVIDd:         5.20 cm LVIDs:         4.50 cm LV PW:         1.70 cm LV IVS:        1.50 cm LVOT diam:     2.30 cm LVOT Area:     4.15 cm  RIGHT VENTRICLE RV S prime:     12.80 cm/s TAPSE (M-mode): 2.3 cm LEFT ATRIUM         Index LA diam:    4.90 cm 2.21 cm/m   SHUNTS Systemic Diam: 2.30 cm Rudean Haskell MD Electronically signed by Rudean Haskell MD Signature Date/Time: 09/21/2022/5:11:41 PM    Final     Cardiac Studies   05/21/22 TTE   IMPRESSIONS     1. Extremely limited due to poor sound wave transmission; LV function  appears to be preserved on definity images; suggest TEE or cardiac MRI to  better evaluate if clinically indicated.   2. Left ventricular ejection fraction, by estimation, is 60 to 65%. The  left ventricle  has normal function. The left ventricle has no regional  wall motion abnormalities. Left ventricular diastolic parameters are  consistent with Grade I diastolic  dysfunction (impaired relaxation).   3. Right ventricular systolic function was not well visualized. The right  ventricular size is not well visualized.   4. The mitral valve was not well visualized. not well visualized mitral  valve regurgitation.   5. Tricuspid valve regurgitation not well visualized.   6. The aortic valve is normal in structure. Aortic valve regurgitation  Not well visualized.   7. Pulmonic valve regurgitation not well visualized.   8. Aortic dilatation noted. There is mild dilatation of the aortic root,  measuring 40 mm.   FINDINGS   Left Ventricle: Left ventricular ejection fraction, by estimation, is 60  to 65%. The left ventricle has normal function. The left ventricle has no  regional  wall motion abnormalities. Definity contrast agent was given IV  to delineate the left ventricular   endocardial borders. The left ventricular internal cavity size was normal  in size. Suboptimal image quality limits for assessment of left  ventricular hypertrophy. Left ventricular diastolic parameters are  consistent with Grade I diastolic dysfunction  (impaired relaxation).   Right Ventricle: The right ventricular size is not well visualized. Right  vetricular wall thickness was not well visualized. Right ventricular  systolic function was not well visualized.   Left Atrium: Left atrial size was not well visualized.   Right Atrium: Right atrial size was not well visualized.   Pericardium: There is no evidence of pericardial effusion.   Mitral Valve: The mitral valve was not well visualized. Not well  visualized mitral valve regurgitation.   Tricuspid Valve: The tricuspid valve is grossly normal. Tricuspid valve  regurgitation not well visualized.   Aortic Valve: The aortic valve is normal in structure. Aortic  valve  regurgitation Not well visualized.   Pulmonic Valve: The pulmonic valve was not well visualized. Pulmonic valve  regurgitation not well visualized.   Aorta: Aortic dilatation noted. There is mild dilatation of the aortic  root, measuring 40 mm.   IAS/Shunts: The interatrial septum was not well visualized.   Additional Comments: Extremely limited due to poor sound wave  transmission; LV function appears to be preserved on definity images;  suggest TEE or cardiac MRI to better evaluate if clinically indicated. A  device lead is visualized.    09/22/22 TTE   IMPRESSIONS     1. Study is extremely technically difficult; worse that prior (2023).  LVEF appears reduced, largely based on one diagnostic image. Consider TEE  or CMR for evaluation when in rhythm.   2. Left ventricular ejection fraction, by estimation, is 35 to 40%. The  left ventricle has moderately decreased function. Left ventricular  endocardial border not optimally defined to evaluate regional wall motion.  There is severe left ventricular  hypertrophy. Left ventricular diastolic parameters are indeterminate.   3. Right ventricular systolic function is normal. The right ventricular  size is not well visualized. Tricuspid regurgitation signal is inadequate  for assessing PA pressure.   4. The mitral valve was not well visualized. No evidence of mitral valve  regurgitation.   5. The aortic valve was not well visualized. Aortic valve regurgitation  is not visualized.   Comparison(s): Prior images reviewed side by side.   FINDINGS   Left Ventricle: Left ventricular ejection fraction, by estimation, is 35  to 40%. The left ventricle has moderately decreased function. Left  ventricular endocardial border not optimally defined to evaluate regional  wall motion. Definity contrast agent  was given IV to delineate the left ventricular endocardial borders. The  left ventricular internal cavity size was normal in  size. There is severe  left ventricular hypertrophy. Left ventricular diastolic parameters are  indeterminate.   Right Ventricle: The right ventricular size is not well visualized. Right  vetricular wall thickness was not well visualized. Right ventricular  systolic function is normal. Tricuspid regurgitation signal is inadequate  for assessing PA pressure.   Left Atrium: Left atrial size was not well visualized.   Right Atrium: Right atrial size was not well visualized.   Pericardium: There is no evidence of pericardial effusion.   Mitral Valve: The mitral valve was not well visualized. No evidence of  mitral valve regurgitation.   Tricuspid Valve: The tricuspid valve is grossly normal. Tricuspid valve  regurgitation  is not demonstrated.   Aortic Valve: The aortic valve was not well visualized. Aortic valve  regurgitation is not visualized.   Pulmonic Valve: The pulmonic valve was not well visualized. Pulmonic valve  regurgitation is not visualized.   Aorta: The ascending aorta was not well visualized and the aortic root was  not well visualized.   IAS/Shunts: The atrial septum is grossly normal.   Additional Comments: A device lead is visualized in the right ventricle  and right atrium.   Patient Profile     Grant [name] is a 77 y.o. male with a hx of  DVT, liver cancer (treatment at [facility] status post left liver lobectomy, radioembolization and cryoablation in August and September 2022) A-fib on chronic anticoagulation, right CVA, CHF, CAD with AICD, HLD, T2DM, HTN, tobacco use, CKD, hypothyroidism who is being seen for the evaluation of heart failure exacerbation at the request of Dr. Roderic Palau.   Assessment & Plan    Acute CHF   Patient with 3 weeks of worsening dyspnea, found to be hypoxic with BNP of 971. Continues to receive IV lasix and is now net negative 4.4L with clinical improvement. Last echocardiogram from 05/21/22 with likely preserved EF (poor echo  windows). Repeat imaging this admission was unfortunately similarly limited.    Discontinue IV diuresis this morning. Creatinine is now increasing and patient appears to be euvolemic. Expect dyspnea to improve now back in sinus rhythm. Patient may end up needing to be given small amount of fluid back today. Unclear what prompted this exacerbation. TSH WNL. I wonder if patient has been having RVR breakthrough at home. Will need to optimize HF GDMT prior to discharge and reinforce low salt diet.  Continue Farxiga Metoprolol Tartrate 12.5mg  initiated yesterday but with lower BP this morning, will stop. HF GDMT generally limited at this time by low BP. Appears that patient may not need daily loop diuretic at discharge.   CAD s/p ICD implant   Patient with initial ICD implantation in 2007 following what was reportedly polymorphic VT. Also underwent cardiac stenting.   Chronic atrial fibrillation CHA2DS2-VASc Score = 8    Patient with conversion back to NSR yesterday afternoon. Chronically takes amiodarone 200mg  and Eliquis.   Will plan to complete 8g reload of Amiodarone. Continue Amiodarone 400mg  BID x7 days then return to 200mg  daily. Continue Eliquis   Per primary team:   Major depressive disorder GERD Right middle cerebral artery stroke Hypothyroidism BPH OSA Hypothyroidism DM type II     For questions or updates, please contact [place] Please consult [email or web address] for contact info under        Signed, Lily Kocher, PA-C  09/23/2022, 7:59 AM

## 2022-09-23 NOTE — Evaluation (Signed)
Occupational Therapy Evaluation Patient Details Name: Grant [name] MRN: [id number] DOB: 08-22-1945 Today's Date: 09/23/2022   History of Present Illness Pt is a 77 y/o M admitted on 09/20/22 after presenting to the ED with c/o increasing SOB over the last 3 weeks. Pt is being treated for presumed CHF exacerbation. PMH: DVT, liver CA, a-fib on chronic anticoagulation, R CVA, CHF, CAD with AICD, HLD, DM2, HTN, tobacco use, CKD, hypothyroid   Clinical Impression   Patient admitted for the diagnosis above.  PTA he lives at home with his spouse, and needed no assist with ADL, iADL, or mobility.  Currently he is needing supplemental O2, which is limiting his independence, needing supervision to [facility] for in room mobility and ADL completion from a sit to stand level.  OT is indicated in the acute setting to address deficits, and HH OT can be considered, if the patient is agreeable.  Patient is expecting to return home with O2.        Recommendations for follow up therapy are one component of a multi-disciplinary discharge planning process, led by the attending physician.  Recommendations may be updated based on patient status, additional functional criteria and insurance authorization.   Assistance Recommended at Discharge Intermittent Supervision/Assistance  Patient can return home with the following Assist for transportation;Assistance with cooking/housework;A little help with bathing/dressing/bathroom;A little help with walking and/or transfers    Functional Status Assessment  Patient has had a recent decline in their functional status and demonstrates the ability to make significant improvements in function in a reasonable and predictable amount of time.  Equipment Recommendations  None recommended by OT    Recommendations for Other Services       Precautions / Restrictions Precautions Precautions: Fall Precaution Comments: Watch O2 Restrictions Weight Bearing Restrictions: No       Mobility Bed Mobility Overal bed mobility: Modified Independent                  Transfers Overall transfer level: Needs assistance Equipment used: Rolling walker (2 wheels) Transfers: Sit to/from Stand Sit to Stand: Supervision, Min guard                  Balance Overall balance assessment: Needs assistance Sitting-balance support: Feet supported Sitting balance-Leahy Scale: Normal     Standing balance support: Reliant on assistive device for balance Standing balance-Leahy Scale: Fair                             ADL either performed or assessed with clinical judgement   ADL       Grooming: Wash/dry hands;Wash/dry face;Supervision/safety;Standing;Min guard               Lower Body Dressing: Supervision/safety;Sit to/from stand;Min guard   Toilet Transfer: Supervision/safety;Rolling walker (2 wheels);Regular Toilet;Min guard                   Vision Patient Visual Report: No change from baseline       Perception     Praxis      Pertinent Vitals/Pain Pain Assessment Pain Assessment: No/denies pain     Hand Dominance Right   Extremity/Trunk Assessment Upper Extremity Assessment Upper Extremity Assessment: Overall WFL for tasks assessed   Lower Extremity Assessment Lower Extremity Assessment: Defer to PT evaluation   Cervical / Trunk Assessment Cervical / Trunk Assessment: Normal   Communication Communication Communication: No difficulties   Cognition Arousal/Alertness: Awake/alert Behavior During  Therapy: WFL for tasks assessed/performed Overall Cognitive Status: Within Functional Limits for tasks assessed                                       General Comments   Just walked with staff    Exercises     Shoulder Instructions      Home Living Family/patient expects to be discharged to:: Private residence Living Arrangements: Spouse/significant other Available Help at Discharge:  Family;Available 24 hours/day Type of Home: House Home Access: Stairs to enter [employer] of Steps: 2 Entrance Stairs-Rails: Right Home Layout: One level     Bathroom Shower/Tub: Occupational psychologist: Standard Bathroom Accessibility: Yes How Accessible: Accessible via walker Home Equipment: [place] (2 wheels);Cane - single point;Wheelchair - manual          Prior Functioning/Environment Prior Level of Function : Independent/Modified Independent             Mobility Comments: Pt reports he's ambulatory in the home with walking stick ADLs Comments: independent adls; pt and wife share IADLs.  No assist with meds        OT Problem List: Impaired balance (sitting and/or standing);Decreased activity tolerance      OT Treatment/Interventions: Self-care/ADL training;Therapeutic activities;Patient/family education;Balance training;DME and/or AE instruction    OT Goals(Current goals can be found in the care plan section) Acute Rehab OT Goals Patient Stated Goal: Hoping to return home OT Goal Formulation: With patient Time For Goal Achievement: 10/07/22 Potential to Achieve Goals: Good ADL Goals Pt Will Perform Grooming: with modified independence;standing Pt Will Perform Lower Body Dressing: with modified independence;sit to/from stand Pt Will Transfer to Toilet: with modified independence;ambulating;regular height toilet  OT Frequency: Min 2X/week    Co-evaluation              AM-PAC OT "6 Clicks" Daily Activity     Outcome Measure Help from another person eating meals?: None Help from another person taking care of personal grooming?: None Help from another person toileting, which includes using toliet, bedpan, or urinal?: A Little Help from another person bathing (including washing, rinsing, drying)?: A Little Help from another person to put on and taking off regular upper body clothing?: None Help from another person to put on and  taking off regular lower body clothing?: A Little 6 Click Score: 21   End of Session Equipment Utilized During Treatment: Gait belt;Oxygen;Rolling walker (2 wheels) Nurse Communication: Mobility status  Activity Tolerance: Patient limited by fatigue Patient left: in bed;with call bell/phone within reach;with family/visitor present  OT Visit Diagnosis: Unsteadiness on feet (R26.81)                Time: [id number] OT Time Calculation (min): 16 min Charges:  OT General Charges $OT Visit: 1 Visit OT Evaluation $OT Eval Moderate Complexity: 1 Mod  09/23/2022  RP, OTR/L  Acute Rehabilitation Services  Office:  [phone]   Metta Clines 09/23/2022, 5:03 PM

## 2022-09-23 NOTE — Progress Notes (Signed)
Pt BP 80/60. Pt converted to NSR from A-fib. Pt on Amiodarone IV. I stopped it due to pt IV painful. Waiting for another placement. Notified provider.

## 2022-09-23 NOTE — Plan of Care (Signed)
  Problem: Cardiac: Goal: Ability to achieve and maintain adequate cardiopulmonary perfusion will improve Outcome: Progressing. Back to NSR

## 2022-09-23 NOTE — Discharge Summary (Signed)
Physician Discharge Summary  Grant [name] [id number] DOB: August 30, 1945 DOA: 09/20/2022  PCP: [facility]  Admit date: 09/20/2022 Discharge date: 09/23/2022  Admitted From: Home Disposition: Home  Recommendations for Outpatient Follow-up:  Follow up with PCP in 1-2 weeks Follow-up with cardiology as scheduled  Home Health: None Equipment/Devices: No new equipment  Discharge Condition: Stable CODE STATUS: Full Diet recommendation: Low-salt low-fat low-carb diet  Brief/Interim Summary: Grant [name] is a 77 y.o. male with medical history significant of  DVT, liver cancer (treatment at [facility] status post left liver lobectomy, radioembolization and cryoablation in August and September 2022) A-fib on chronic anticoagulation, right CVA, CHF, CAD with AICD, HLD, T2DM, HTN, tobacco use, CKD, hypothyroid use who presents to the ED with increasing shortness of breath times the last 3 weeks.  Hospitalist called for admission, cardiology called in consult.   Patient admitted as above with acute respiratory failure in the setting of heart failure exacerbation. Cardiology consulted, patient tolerated IV diuresis quite well.  Echocardiogram with poor windows but generally unchanged from prior.  Patient also noted to have A-fib with RVR, plan to cardiovert patient on 09/23/2022 but was canceled as patient had cardioverted spontaneously overnight on amiodarone.  Patient's other chronic comorbid conditions appear to be well-controlled.  At this time patient's heart rate is well-controlled he is no longer hypoxic, he diuresed quite well, will discontinue IV diuretics and discharged home on p.o. diuretics per cardiology recommendations.  Close outpatient follow-up with PCP and cardiology in the next few weeks as scheduled.  Lengthy discussion at bedside with patient about need for dietary compliance as well as medication compliance.  Discharge Diagnoses:  Principal Problem:   CHF (congestive  heart failure) Active Problems:   Diet-controlled diabetes mellitus   Essential hypertension   Benign prostatic hyperplasia   Tobacco abuse   Hypothyroidism   CAD (coronary artery disease)   Chronic atrial fibrillation   Right middle cerebral artery stroke   Gastroesophageal reflux disease   Liver cell carcinoma   Moderate chronic obstructive pulmonary disease   Obstructive sleep apnea   Sensorineural hearing loss, bilateral   Coronary atherosclerosis of native coronary artery   Major depressive disorder, recurrent episode, moderate    Discharge Instructions   Allergies as of 09/23/2022       Reactions   Food Anaphylaxis   LAMB OR GOAT   Zestril [lisinopril] Cough        Medication List     STOP taking these medications    amoxicillin 250 MG capsule Commonly known as: AMOXIL       TAKE these medications    acetaminophen 325 MG tablet Commonly known as: TYLENOL Take 2 tablets (650 mg total) by mouth every 4 (four) hours as needed for mild pain (or temp > 37.5 C (99.5 F)).   amiodarone 400 MG tablet Commonly known as: PACERONE Take 1 tablet (400 mg total) by mouth 2 (two) times daily. What changed:  medication strength how much to take when to take this   apixaban 5 MG Tabs tablet Commonly known as: Eliquis Take 1 tablet (5 mg total) by mouth 2 (two) times daily.   aspirin 81 MG chewable tablet Chew 81 mg by mouth daily.   atorvastatin 40 MG tablet Commonly known as: LIPITOR Take 1 tablet (40 mg total) by mouth at bedtime.   Basaglar KwikPen 100 UNIT/ML Inject 10 Units into the skin at bedtime.   Cholecalciferol 125 MCG (5000 UT) Tabs Take 5,000 Units by  mouth daily.   dapagliflozin propanediol 10 MG Tabs tablet Commonly known as: FARXIGA Take 1 tablet (10 mg total) by mouth daily. Start taking on: September 24, 2022   escitalopram 10 MG tablet Commonly known as: LEXAPRO Take 1 tablet (10 mg total) by mouth at bedtime.   finasteride 5 MG  tablet Commonly known as: PROSCAR Take 5 mg by mouth daily.   furosemide 40 MG tablet Commonly known as: LASIX Take 1 tablet (40 mg total) by mouth daily. Start taking on: September 24, 2022   levothyroxine 50 MCG tablet Commonly known as: SYNTHROID Take 1 tablet (50 mcg total) by mouth daily before breakfast.   losartan 25 MG tablet Commonly known as: COZAAR Take 1 tablet (25 mg total) by mouth daily. Start taking on: September 24, 2022   multivitamin with minerals Tabs tablet Take 1 tablet by mouth daily.   nitroGLYCERIN 0.4 MG SL tablet Commonly known as: NITROSTAT Place 1 tablet (0.4 mg total) under the tongue every 5 (five) minutes as needed for chest pain.   omeprazole 40 MG capsule Commonly known as: PRILOSEC Take 1 capsule (40 mg total) by mouth 2 (two) times daily.   polyethylene glycol 17 g packet Commonly known as: MIRALAX / GLYCOLAX Take 17 g by mouth daily. What changed:  when to take this reasons to take this   pregabalin 75 MG capsule Commonly known as: LYRICA Take 1 capsule (75 mg total) by mouth 2 (two) times daily. What changed:  medication strength how much to take   tamsulosin 0.4 MG Caps capsule Commonly known as: FLOMAX Take 1 capsule (0.4 mg total) by mouth daily after supper.        Follow-up Information     [facility] [place]. Go in 16 day(s).   Specialty: Cardiology Why: Hospital follow up 10/08/2022 @ 2 pm PLEASE bring a current medication list to appointment FREE valet parking, Entrance C, off [employer] information: [address] [id number] [place] [address] [phone]               Allergies  Allergen Reactions   Food Anaphylaxis    LAMB OR GOAT   Zestril [Lisinopril] Cough    Consultations: Cardiology   Procedures/Studies: ECHOCARDIOGRAM COMPLETE  Result Date: 09/21/2022    ECHOCARDIOGRAM REPORT   Patient Name:   Grant [name]  Date of Exam: 09/21/2022 Medical Rec #:  [id number]            Height:       74.0 in Accession #:    [id number]           Weight:       209.0 lb Date of Birth:  1945/12/27            BSA:          2.214 m Patient Age:    77 years             BP:           104/87 mmHg Patient Gender: M                    HR:           127 bpm. Exam Location:  Inpatient Procedure: 2D Echo, Color Doppler, Cardiac Doppler and Intracardiac            Opacification Agent Indications:    Eval for CHF  History:  Patient has prior history of Echocardiogram examinations, most                 recent 05/21/2022. CHF, Stroke; Risk Factors:Hypertension,                 Diabetes and Current Smoker.  Sonographer:    Rolla Etienne Referring Phys: [id number] Lily Kocher  Sonographer Comments: No subcostal window. Image acquisition challenging due to patient body habitus. IMPRESSIONS  1. Study is extremely technically difficult; worse that prior (2023). LVEF appears reduced, largely based on one diagnostic image. Consider TEE or CMR for evaluation when in rhythm.  2. Left ventricular ejection fraction, by estimation, is 35 to 40%. The left ventricle has moderately decreased function. Left ventricular endocardial border not optimally defined to evaluate regional wall motion. There is severe left ventricular hypertrophy. Left ventricular diastolic parameters are indeterminate.  3. Right ventricular systolic function is normal. The right ventricular size is not well visualized. Tricuspid regurgitation signal is inadequate for assessing PA pressure.  4. The mitral valve was not well visualized. No evidence of mitral valve regurgitation.  5. The aortic valve was not well visualized. Aortic valve regurgitation is not visualized. Comparison(s): Prior images reviewed side by side. FINDINGS  Left Ventricle: Left ventricular ejection fraction, by estimation, is 35 to 40%. The left ventricle has moderately decreased function. Left ventricular endocardial border  not optimally defined to evaluate regional wall motion. Definity contrast agent was given IV to delineate the left ventricular endocardial borders. The left ventricular internal cavity size was normal in size. There is severe left ventricular hypertrophy. Left ventricular diastolic parameters are indeterminate. Right Ventricle: The right ventricular size is not well visualized. Right vetricular wall thickness was not well visualized. Right ventricular systolic function is normal. Tricuspid regurgitation signal is inadequate for assessing PA pressure. Left Atrium: Left atrial size was not well visualized. Right Atrium: Right atrial size was not well visualized. Pericardium: There is no evidence of pericardial effusion. Mitral Valve: The mitral valve was not well visualized. No evidence of mitral valve regurgitation. Tricuspid Valve: The tricuspid valve is grossly normal. Tricuspid valve regurgitation is not demonstrated. Aortic Valve: The aortic valve was not well visualized. Aortic valve regurgitation is not visualized. Pulmonic Valve: The pulmonic valve was not well visualized. Pulmonic valve regurgitation is not visualized. Aorta: The ascending aorta was not well visualized and the aortic root was not well visualized. IAS/Shunts: The atrial septum is grossly normal. Additional Comments: A device lead is visualized in the right ventricle and right atrium.  LEFT VENTRICLE PLAX 2D LVIDd:         5.20 cm LVIDs:         4.50 cm LV PW:         1.70 cm LV IVS:        1.50 cm LVOT diam:     2.30 cm LVOT Area:     4.15 cm  RIGHT VENTRICLE RV S prime:     12.80 cm/s TAPSE (M-mode): 2.3 cm LEFT ATRIUM         Index LA diam:    4.90 cm 2.21 cm/m   SHUNTS Systemic Diam: 2.30 cm Rudean Haskell MD Electronically signed by Rudean Haskell MD Signature Date/Time: 09/21/2022/5:11:41 PM    Final    DG Chest 2 View  Result Date: 09/20/2022 CLINICAL DATA:  Shortness of breath EXAM: CHEST - 2 VIEW COMPARISON:  06/14/2022  FINDINGS: Left-sided implanted cardiac device remains in place. Mild cardiomegaly. Aortic atherosclerosis. Pulmonary  vascular congestion. Mild interstitial prominence. No focal consolidation. Trace pleural effusions. No pneumothorax. IMPRESSION: Findings suggestive of mild CHF with interstitial edema and trace pleural effusions. Electronically Signed   By: Davina Poke D.O.   On: 09/20/2022 13:14     Subjective: No acute issues or events overnight denies nausea vomiting diarrhea constipation headache fevers chills chest pain shortness of breath.  Edema appears to have resolved, no longer having orthopnea or dyspnea with exertion as compared to admission.   Discharge Exam: Vitals:   09/23/22 0933 09/23/22 1330  BP: 102/66 97/65  Pulse: 68   Resp: 16 18  Temp:  97.9 F (36.6 C)  SpO2: 93% 97%   Vitals:   09/23/22 0500 09/23/22 0803 09/23/22 0933 09/23/22 1330  BP:  95/66 102/66 97/65  Pulse:  73 68   Resp:  16 16 18   Temp:  98.5 F (36.9 C)  97.9 F (36.6 C)  TempSrc:  Oral  Oral  SpO2:  95% 93% 97%  Weight: 93.5 kg     Height:        General: Pt is alert, awake, not in acute distress Cardiovascular: RRR, S1/S2 +, no rubs, no gallops Respiratory: CTA bilaterally, no wheezing, no rhonchi Abdominal: Soft, NT, ND, bowel sounds + Extremities: no edema, no cyanosis    The results of significant diagnostics from this hospitalization (including imaging, microbiology, ancillary and laboratory) are listed below for reference.     Microbiology: No results found for this or any previous visit (from the past 240 hour(s)).   Labs: BNP (last 3 results) Recent Labs    09/20/22 1405  BNP 123XX123*   Basic Metabolic Panel: Recent Labs  Lab 09/20/22 1405 09/21/22 0532 09/22/22 0525 09/22/22 1514 09/23/22 0505  NA 136 137 138 137 136  K 3.9 3.4* 2.9* 3.9 3.6  CL 106 104 100 101 99  CO2 22 26 27 26 26   GLUCOSE 136* 118* 112* 122* 124*  BUN 13 14 14 15 21   CREATININE 1.06  1.13 1.25* 1.35* 1.61*  CALCIUM 8.3* 8.4* 8.6* 8.7* 8.5*   Liver Function Tests: Recent Labs  Lab 09/20/22 1405  AST 20  ALT 14  ALKPHOS 48  BILITOT 0.5  PROT 6.5  ALBUMIN 3.7   No results for input(s): "LIPASE", "AMYLASE" in the last 168 hours. No results for input(s): "AMMONIA" in the last 168 hours. CBC: Recent Labs  Lab 09/20/22 1405 09/21/22 0532 09/22/22 0525 09/23/22 0505  WBC 7.1 6.6 7.3 8.7  NEUTROABS 4.8  --   --   --   HGB 11.1* 10.9* 11.7* 11.3*  HCT 36.4* 35.5* 37.1* 36.1*  MCV 88.1 87.4 85.3 85.5  PLT 217 193 223 215   Cardiac Enzymes: No results for input(s): "CKTOTAL", "CKMB", "CKMBINDEX", "TROPONINI" in the last 168 hours. BNP: Invalid input(s): "POCBNP" CBG: Recent Labs  Lab 09/22/22 1149 09/22/22 1724 09/22/22 2013 09/23/22 0735 09/23/22 1202  GLUCAP 179* 127* 142* 123* 120*   Thyroid function studies Recent Labs    09/21/22 0846  TSH 3.950   Urinalysis    Component Value Date/Time   COLORURINE YELLOW 06/14/2022 1756   APPEARANCEUR CLOUDY (A) 06/14/2022 1756   LABSPEC 1.013 06/14/2022 1756   PHURINE 6.0 06/14/2022 1756   GLUCOSEU NEGATIVE 06/14/2022 1756   HGBUR NEGATIVE 06/14/2022 1756   BILIRUBINUR NEGATIVE 06/14/2022 1756   BILIRUBINUR neg 07/05/2014 1344   KETONESUR NEGATIVE 06/14/2022 1756   PROTEINUR NEGATIVE 06/14/2022 1756   UROBILINOGEN 0.2 07/05/2014 1344  NITRITE NEGATIVE 06/14/2022 1756   LEUKOCYTESUR MODERATE (A) 06/14/2022 1756   Sepsis Labs Recent Labs  Lab 09/20/22 1405 09/21/22 0532 09/22/22 0525 09/23/22 0505  WBC 7.1 6.6 7.3 8.7    Time coordinating discharge: Over 30 minutes  SIGNED:   Little Ishikawa, DO [facility] 09/23/2022, 2:49 PM Pager   If 7PM-7AM, please contact night-coverage [email or web address]

## 2022-09-23 NOTE — Discharge Instructions (Addendum)
Please take all prescribed medications exactly as instructed including your new regimen of Lasix daily Please weigh yourself daily.  Please record these weights in urinal and share this with your cardiologist and primary care provider upon follow-up. If your weight happens increased by more than 2 pounds in 1 day or 5 pounds in 1 week take an additional dose of Lasix that day. Please consume a low sodium low carbohydrate diet Please return to the emergency department if you develop worsening shortness of breath weakness or chest pain.

## 2022-09-24 LAB — CBC
HCT: 34.2 % — ABNORMAL LOW (ref 39.0–52.0)
Hemoglobin: 10.8 g/dL — ABNORMAL LOW (ref 13.0–17.0)
MCH: 26.8 pg (ref 26.0–34.0)
MCHC: 31.6 g/dL (ref 30.0–36.0)
MCV: 84.9 fL (ref 80.0–100.0)
Platelets: 194 10*3/uL (ref 150–400)
RBC: 4.03 MIL/uL — ABNORMAL LOW (ref 4.22–5.81)
RDW: 13.8 % (ref 11.5–15.5)
WBC: 8.3 10*3/uL (ref 4.0–10.5)
nRBC: 0 % (ref 0.0–0.2)

## 2022-09-24 LAB — BASIC METABOLIC PANEL
Anion gap: 11 (ref 5–15)
BUN: 21 mg/dL (ref 8–23)
CO2: 26 mmol/L (ref 22–32)
Calcium: 8.5 mg/dL — ABNORMAL LOW (ref 8.9–10.3)
Chloride: 100 mmol/L (ref 98–111)
Creatinine, Ser: 1.56 mg/dL — ABNORMAL HIGH (ref 0.61–1.24)
GFR, Estimated: 45 mL/min — ABNORMAL LOW (ref >60)
Glucose, Bld: 110 mg/dL — ABNORMAL HIGH (ref 70–99)
Potassium: 3.6 mmol/L (ref 3.5–5.1)
Sodium: 137 mmol/L (ref 135–145)

## 2022-09-24 LAB — GLUCOSE, CAPILLARY
Glucose-Capillary: 103 mg/dL — ABNORMAL HIGH (ref 70–99)
Glucose-Capillary: 130 mg/dL — ABNORMAL HIGH (ref 70–99)
Glucose-Capillary: 145 mg/dL — ABNORMAL HIGH (ref 70–99)
Glucose-Capillary: 175 mg/dL — ABNORMAL HIGH (ref 70–99)

## 2022-09-24 NOTE — Progress Notes (Signed)
PT Cancellation Note  Patient Details Name: Grant [name] MRN: [id number] DOB: 12-24-1945   Cancelled Treatment:    Reason Eval/Treat Not Completed: Other (comment) Pt supposed to d/c but is awaiting home O2.  Checked with pt for therapy session as he is still in hospital, but he declined due to fatigue.  Will f/u as able.  Anise Salvo, PT Acute Rehab [facility] [phone]   Rayetta Humphrey 09/24/2022, 5:17 PM

## 2022-09-24 NOTE — TOC Transition Note (Addendum)
Transition of Care [facility]) - CM/SW Discharge Note   Patient Details  Name: Grant [name] MRN: [id number] Date of Birth: [birth year]  Transition of Care [facility]) CM/SW Contact:  Adrian Prows, RN Phone Number: 09/24/2022, 1:23 PM   Clinical Narrative:    [phone]- spoke w/ [employer] at [facility] oxygen line; she says fax has been received but she is awaiting pulmonary to input information; she was also given pt's wife contact phone # to set up delivery of home oxygen supplies; she says pt's family is educated at time of delivery of dme to home; at time she will be given the travel tank for pt; awaiting delivery of oxygen: [employer] says they have a 4 hour window for delivery; notified Amy at [facility] of pt d/c today; she confirms she will [facility] all paperwork [facility]; no TOC needs.  -[phone]- notified by Deanna Artis from [place], awaiting orders from their pulmonologist so oxygen can be delivered; Adrianne, RN notified.   Final next level of care: Home w Home Health Services Barriers to Discharge: No Barriers Identified   Patient Goals and CMS Choice CMS Medicare.gov Compare Post Acute Care list provided to:: Patient Choice offered to / list presented to : Patient  Discharge Placement                         Discharge Plan and Services Additional resources added to the After Visit Summary for                                       Social Determinants of Health (SDOH) Interventions SDOH Screenings   Food Insecurity: No Food Insecurity (09/20/2022)  Housing: Low Risk  (09/22/2022)  Transportation Needs: No Transportation Needs (09/22/2022)  Utilities: Not At Risk (09/20/2022)  Alcohol Screen: Low Risk  (09/22/2022)  Financial Resource Strain: Low Risk  (09/22/2022)  Tobacco Use: High Risk (09/22/2022)     Readmission Risk Interventions     No data to display

## 2022-09-24 NOTE — Discharge Summary (Signed)
Physician Discharge Summary  Grant HurtRobert Junior Arp ZOX:[id number] DOB: [birth year] DOA: 09/20/2022  [facility]  Admit date: 09/20/2022 Discharge date: 09/24/2022  Admitted From: Home Disposition: Home  Recommendations for Outpatient Follow-up:  Follow up with PCP in 1-2 weeks Follow-up with cardiology as scheduled  Home Health: None Equipment/Devices: No new equipment  Discharge Condition: Stable CODE STATUS: Full Diet recommendation: Low-salt low-fat low-carb diet  Brief/Interim Summary: Grant [name] is a 77 y.o. male with medical history significant of  DVT, liver cancer (treatment at [facility] status post left liver lobectomy, radioembolization and cryoablation in August and September 2022) A-fib on chronic anticoagulation, right CVA, CHF, CAD with AICD, HLD, T2DM, HTN, tobacco use, CKD, hypothyroid use who presents to the ED with increasing shortness of breath times the last 3 weeks.  Hospitalist called for admission, cardiology called in consult.   Patient admitted as above with acute respiratory failure in the setting of heart failure exacerbation. Cardiology consulted, patient tolerated IV diuresis quite well.  Echocardiogram with poor windows but generally unchanged from prior.  Patient also noted to have A-fib with RVR, plan to cardiovert patient on 09/23/2022 but was canceled as patient had cardioverted spontaneously overnight on amiodarone.  Patient's other chronic comorbid conditions appear to be well-controlled.  At this time patient's heart rate is well-controlled he is no longer hypoxic, he diuresed quite well, will discontinue IV diuretics and discharged home on p.o. diuretics per cardiology recommendations.  Close outpatient follow-up with PCP and cardiology in the next few weeks as scheduled.  Lengthy discussion at bedside with patient about need for dietary compliance as well as medication compliance.  Patient discharged 09/23/22 - issues setting up home oxygen so  patient was unable to discharge. Remains medically stable for discharge once home oxygen has been secured.  Discharge Diagnoses:  Principal Problem:   CHF (congestive heart failure) Active Problems:   Diet-controlled diabetes mellitus   Essential hypertension   Benign prostatic hyperplasia   Tobacco abuse   Hypothyroidism   CAD (coronary artery disease)   Chronic atrial fibrillation   Right middle cerebral artery stroke   Gastroesophageal reflux disease   Liver cell carcinoma   Moderate chronic obstructive pulmonary disease   Obstructive sleep apnea   Sensorineural hearing loss, bilateral   Coronary atherosclerosis of native coronary artery   Major depressive disorder, recurrent episode, moderate    Discharge Instructions   Allergies as of 09/24/2022       Reactions   Food Anaphylaxis   LAMB OR GOAT   Zestril [lisinopril] Cough        Medication List     STOP taking these medications    amoxicillin 250 MG capsule Commonly known as: AMOXIL       TAKE these medications    acetaminophen 325 MG tablet Commonly known as: TYLENOL Take 2 tablets (650 mg total) by mouth every 4 (four) hours as needed for mild pain (or temp > 37.5 C (99.5 F)).   amiodarone 200 MG tablet Commonly known as: PACERONE Take two tablets (400 mg) by mouth twice a day for 6 days then on 09/30/2022 change to one tablet (200 mg) by mouth once a day What changed:  how much to take how to take this when to take this additional instructions   apixaban 5 MG Tabs tablet Commonly known as: Eliquis Take 1 tablet (5 mg total) by mouth 2 (two) times daily.   aspirin 81 MG chewable tablet Chew 81 mg by mouth daily.  atorvastatin 40 MG tablet Commonly known as: LIPITOR Take 1 tablet (40 mg total) by mouth at bedtime.   Basaglar KwikPen 100 UNIT/ML Inject 10 Units into the skin at bedtime.   Cholecalciferol 125 MCG (5000 UT) Tabs Take 5,000 Units by mouth daily.   dapagliflozin  propanediol 10 MG Tabs tablet Commonly known as: FARXIGA Take 1 tablet (10 mg total) by mouth daily.   escitalopram 10 MG tablet Commonly known as: LEXAPRO Take 1 tablet (10 mg total) by mouth at bedtime.   finasteride 5 MG tablet Commonly known as: PROSCAR Take 5 mg by mouth daily.   furosemide 40 MG tablet Commonly known as: LASIX Take 1 tablet (40 mg total) by mouth daily.   levothyroxine 50 MCG tablet Commonly known as: SYNTHROID Take 1 tablet (50 mcg total) by mouth daily before breakfast.   losartan 25 MG tablet Commonly known as: COZAAR Take 1 tablet (25 mg total) by mouth daily.   multivitamin with minerals Tabs tablet Take 1 tablet by mouth daily.   nitroGLYCERIN 0.4 MG SL tablet Commonly known as: NITROSTAT Place 1 tablet (0.4 mg total) under the tongue every 5 (five) minutes as needed for chest pain.   omeprazole 40 MG capsule Commonly known as: PRILOSEC Take 1 capsule (40 mg total) by mouth 2 (two) times daily.   polyethylene glycol 17 g packet Commonly known as: MIRALAX / GLYCOLAX Take 17 g by mouth daily. What changed:  when to take this reasons to take this   pregabalin 75 MG capsule Commonly known as: LYRICA Take 1 capsule (75 mg total) by mouth 2 (two) times daily. What changed:  medication strength how much to take   tamsulosin 0.4 MG Caps capsule Commonly known as: FLOMAX Take 1 capsule (0.4 mg total) by mouth daily after supper.               Durable Medical Equipment  (From admission, onward)           Start     Ordered   09/23/22 1641  For home use only DME oxygen  Once       Question Answer Comment  Length of Need 6 Months   Mode or (Route) Nasal cannula   Liters per Minute 3   Frequency Continuous (stationary and portable oxygen unit needed)   Oxygen delivery system Gas      09/23/22 1642            Follow-up Information     [facility]. Go in 16 day(s).    Specialty: Cardiology Why: Hospital follow up 10/08/2022 @ 2 pm PLEASE bring a current medication list to appointment FREE valet parking, Entrance C, off [address] Contact information: [address] [id number] [place] [id number] [phone]               Allergies  Allergen Reactions   Food Anaphylaxis    LAMB OR GOAT   Zestril [Lisinopril] Cough    Consultations: Cardiology   Procedures/Studies: ECHOCARDIOGRAM COMPLETE  Result Date: 09/21/2022    ECHOCARDIOGRAM REPORT   Patient Name:   Grant HolsterROBERT JUNIOR Trethewey Date of Exam: 09/21/2022 Medical Rec #:  [id number]            Height:       74.0 in Accession #:    [id number]           Weight:       209.0 lb Date of Birth:  14-May-1946            BSA:          2.214 m Patient Age:    76 years             BP:           104/87 mmHg Patient Gender: M                    HR:           127 bpm. Exam Location:  Inpatient Procedure: 2D Echo, Color Doppler, Cardiac Doppler and Intracardiac            Opacification Agent Indications:    Eval for CHF  History:        Patient has prior history of Echocardiogram examinations, most                 recent 05/21/2022. CHF, Stroke; Risk Factors:Hypertension,                 Diabetes and Current Smoker.  Sonographer:    Milbert Coulter Referring Phys: [id number] Perlie Gold  Sonographer Comments: No subcostal window. Image acquisition challenging due to patient body habitus. IMPRESSIONS  1. Study is extremely technically difficult; worse that prior (2023). LVEF appears reduced, largely based on one diagnostic image. Consider TEE or CMR for evaluation when in rhythm.  2. Left ventricular ejection fraction, by estimation, is 35 to 40%. The left ventricle has moderately decreased function. Left ventricular endocardial border not optimally defined to evaluate regional wall motion. There is severe left ventricular hypertrophy. Left ventricular diastolic parameters are indeterminate.  3. Right  ventricular systolic function is normal. The right ventricular size is not well visualized. Tricuspid regurgitation signal is inadequate for assessing PA pressure.  4. The mitral valve was not well visualized. No evidence of mitral valve regurgitation.  5. The aortic valve was not well visualized. Aortic valve regurgitation is not visualized. Comparison(s): Prior images reviewed side by side. FINDINGS  Left Ventricle: Left ventricular ejection fraction, by estimation, is 35 to 40%. The left ventricle has moderately decreased function. Left ventricular endocardial border not optimally defined to evaluate regional wall motion. Definity contrast agent was given IV to delineate the left ventricular endocardial borders. The left ventricular internal cavity size was normal in size. There is severe left ventricular hypertrophy. Left ventricular diastolic parameters are indeterminate. Right Ventricle: The right ventricular size is not well visualized. Right vetricular wall thickness was not well visualized. Right ventricular systolic function is normal. Tricuspid regurgitation signal is inadequate for assessing PA pressure. Left Atrium: Left atrial size was not well visualized. Right Atrium: Right atrial size was not well visualized. Pericardium: There is no evidence of pericardial effusion. Mitral Valve: The mitral valve was not well visualized. No evidence of mitral valve regurgitation. Tricuspid Valve: The tricuspid valve is grossly normal. Tricuspid valve regurgitation is not demonstrated. Aortic Valve: The aortic valve was not well visualized. Aortic valve regurgitation is not visualized. Pulmonic Valve: The pulmonic valve was not well visualized. Pulmonic valve regurgitation is not visualized. Aorta: The ascending aorta was not well visualized and the aortic root was not well visualized. IAS/Shunts: The atrial septum is grossly normal. Additional Comments: A device lead is visualized in the right ventricle and right  atrium.  LEFT VENTRICLE PLAX 2D LVIDd:         5.20 cm LVIDs:         4.50 cm LV PW:  1.70 cm LV IVS:        1.50 cm LVOT diam:     2.30 cm LVOT Area:     4.15 cm  RIGHT VENTRICLE RV S prime:     12.80 cm/s TAPSE (M-mode): 2.3 cm LEFT ATRIUM         Index LA diam:    4.90 cm 2.21 cm/m   SHUNTS Systemic Diam: 2.30 cm Riley Lam MD Electronically signed by Riley Lam MD Signature Date/Time: 09/21/2022/5:11:41 PM    Final    DG Chest 2 View  Result Date: 09/20/2022 CLINICAL DATA:  Shortness of breath EXAM: CHEST - 2 VIEW COMPARISON:  06/14/2022 FINDINGS: Left-sided implanted cardiac device remains in place. Mild cardiomegaly. Aortic atherosclerosis. Pulmonary vascular congestion. Mild interstitial prominence. No focal consolidation. Trace pleural effusions. No pneumothorax. IMPRESSION: Findings suggestive of mild CHF with interstitial edema and trace pleural effusions. Electronically Signed   By: Duanne Guess D.O.   On: 09/20/2022 13:14     Subjective: No acute issues or events overnight denies nausea vomiting diarrhea constipation headache fevers chills chest pain shortness of breath.  Edema appears to have resolved, no longer having orthopnea or dyspnea with exertion as compared to admission.   Discharge Exam: Vitals:   09/23/22 2141 09/24/22 0455  BP:  106/68  Pulse:  80  Resp:  20  Temp: 98.9 F (37.2 C)   SpO2:  94%   Vitals:   09/23/22 1453 09/23/22 2141 09/24/22 0455 09/24/22 0500  BP: (!) 156/77  106/68   Pulse: 89  80   Resp: (!) 22  20   Temp: 98.2 F (36.8 C) 98.9 F (37.2 C)    TempSrc: Oral     SpO2: 98%  94%   Weight:    94.2 kg  Height:        General: Pt is alert, awake, not in acute distress Cardiovascular: RRR, S1/S2 +, no rubs, no gallops Respiratory: CTA bilaterally, no wheezing, no rhonchi Abdominal: Soft, NT, ND, bowel sounds + Extremities: no edema, no cyanosis    The results of significant diagnostics from this  hospitalization (including imaging, microbiology, ancillary and laboratory) are listed below for reference.     Microbiology: No results found for this or any previous visit (from the past 240 hour(s)).   Labs: BNP (last 3 results) Recent Labs    09/20/22 1405  BNP 971.2*    Basic Metabolic Panel: Recent Labs  Lab 09/21/22 0532 09/22/22 0525 09/22/22 1514 09/23/22 0505 09/24/22 0509  NA 137 138 137 136 137  K 3.4* 2.9* 3.9 3.6 3.6  CL 104 100 101 99 100  CO2 26 27 26 26 26   GLUCOSE 118* 112* 122* 124* 110*  BUN 14 14 15 21 21   CREATININE 1.13 1.25* 1.35* 1.61* 1.56*  CALCIUM 8.4* 8.6* 8.7* 8.5* 8.5*    Liver Function Tests: Recent Labs  Lab 09/20/22 1405  AST 20  ALT 14  ALKPHOS 48  BILITOT 0.5  PROT 6.5  ALBUMIN 3.7    No results for input(s): "LIPASE", "AMYLASE" in the last 168 hours. No results for input(s): "AMMONIA" in the last 168 hours. CBC: Recent Labs  Lab 09/20/22 1405 09/21/22 0532 09/22/22 0525 09/23/22 0505 09/24/22 0509  WBC 7.1 6.6 7.3 8.7 8.3  NEUTROABS 4.8  --   --   --   --   HGB 11.1* 10.9* 11.7* 11.3* 10.8*  HCT 36.4* 35.5* 37.1* 36.1* 34.2*  MCV 88.1 87.4 85.3 85.5 84.9  PLT 217 193 223 215 194    Cardiac Enzymes: No results for input(s): "CKTOTAL", "CKMB", "CKMBINDEX", "TROPONINI" in the last 168 hours. BNP: Invalid input(s): "POCBNP" CBG: Recent Labs  Lab 09/22/22 2013 09/23/22 0735 09/23/22 1202 09/23/22 1640 09/23/22 2145  GLUCAP 142* 123* 120* 115* 157*    Thyroid function studies Recent Labs    09/21/22 0846  TSH 3.950    Urinalysis    Component Value Date/Time   COLORURINE YELLOW 06/14/2022 1756   APPEARANCEUR CLOUDY (A) 06/14/2022 1756   LABSPEC 1.013 06/14/2022 1756   PHURINE 6.0 06/14/2022 1756   GLUCOSEU NEGATIVE 06/14/2022 1756   HGBUR NEGATIVE 06/14/2022 1756   BILIRUBINUR NEGATIVE 06/14/2022 1756   BILIRUBINUR neg 07/05/2014 1344   KETONESUR NEGATIVE 06/14/2022 1756   PROTEINUR NEGATIVE  06/14/2022 1756   UROBILINOGEN 0.2 07/05/2014 1344   NITRITE NEGATIVE 06/14/2022 1756   LEUKOCYTESUR MODERATE (A) 06/14/2022 1756   Sepsis Labs Recent Labs  Lab 09/21/22 0532 09/22/22 0525 09/23/22 0505 09/24/22 0509  WBC 6.6 7.3 8.7 8.3     Time coordinating discharge: Over 30 minutes  SIGNED:   Azucena Fallen, DO [facility] 09/24/2022, 7:48 AM Pager   If 7PM-7AM, please contact night-coverage [email or web address]

## 2022-09-24 NOTE — TOC Progression Note (Addendum)
Transition of Care [facility]) - Progression Note    Patient Details  Name: Grant [name] MRN: [id number] Date of Birth: [birth year]  Transition of [facility] CM/SW Contact  Adrian Prows, RN Phone Number: 09/24/2022, 10:31 AM  Clinical Narrative:    Harriet Masson at [facility] oxygen line; she says oxygen sat document/orders should be faxed to [phone], and sat document, orders, and d/c summary should be faxed to [phone]; documents faxed and electronic confirmation received.  -0930- LVM for [employer] at [phone]; attempt to find out status of oxygen; awaiting return call  -1031- spoke w/ Kedia at [facility] oxygen line; she says fax has been received but she is awaiting pulmonary to input information; she was also given pt's wife contact phone # to set up delivery of home oxygen supplies; she says pt's family is educated at time of delivery of dme to home; at time she will be given the travel tank for pt; awaiting delivery of oxygen: [employer] says they have a 4 hour window for delivery.   Expected Discharge Plan: Home w Home Health Services Barriers to Discharge: No Barriers Identified  Expected Discharge Plan and Services       Living arrangements for the past 2 months: Single Family Home Expected Discharge Date: 09/23/22                                     Social Determinants of Health (SDOH) Interventions SDOH Screenings   Food Insecurity: No Food Insecurity (09/20/2022)  Housing: Low Risk  (09/22/2022)  Transportation Needs: No Transportation Needs (09/22/2022)  Utilities: Not At Risk (09/20/2022)  Alcohol Screen: Low Risk  (09/22/2022)  Financial Resource Strain: Low Risk  (09/22/2022)  Tobacco Use: High Risk (09/22/2022)    Readmission Risk Interventions     No data to display

## 2022-09-25 LAB — CBC
HCT: 35.4 % — ABNORMAL LOW (ref 39.0–52.0)
Hemoglobin: 10.4 g/dL — ABNORMAL LOW (ref 13.0–17.0)
MCH: 27.2 pg (ref 26.0–34.0)
MCHC: 29.4 g/dL — ABNORMAL LOW (ref 30.0–36.0)
MCV: 92.4 fL (ref 80.0–100.0)
Platelets: 199 K/uL (ref 150–400)
RBC: 3.83 MIL/uL — ABNORMAL LOW (ref 4.22–5.81)
RDW: 14 % (ref 11.5–15.5)
WBC: 8.8 K/uL (ref 4.0–10.5)
nRBC: 0.2 % (ref 0.0–0.2)

## 2022-09-25 LAB — BASIC METABOLIC PANEL
Anion gap: 9 (ref 5–15)
BUN: 19 mg/dL (ref 8–23)
CO2: 27 mmol/L (ref 22–32)
Calcium: 8.3 mg/dL — ABNORMAL LOW (ref 8.9–10.3)
Chloride: 101 mmol/L (ref 98–111)
Creatinine, Ser: 1.49 mg/dL — ABNORMAL HIGH (ref 0.61–1.24)
GFR, Estimated: 48 mL/min — ABNORMAL LOW (ref >60)
Glucose, Bld: 85 mg/dL (ref 70–99)
Potassium: 3.3 mmol/L — ABNORMAL LOW (ref 3.5–5.1)
Sodium: 137 mmol/L (ref 135–145)

## 2022-09-25 LAB — GLUCOSE, CAPILLARY
Glucose-Capillary: 110 mg/dL — ABNORMAL HIGH (ref 70–99)
Glucose-Capillary: 103 mg/dL — ABNORMAL HIGH (ref 70–99)
Glucose-Capillary: 148 mg/dL — ABNORMAL HIGH (ref 70–99)
Glucose-Capillary: 145 mg/dL — ABNORMAL HIGH (ref 70–99)

## 2022-09-25 NOTE — Discharge Summary (Signed)
Physician Discharge Summary  Grant [name] ZOX:[id number] DOB: [birth year] DOA: 09/20/2022  [facility]  Admit date: 09/20/2022 Discharge date: 09/25/2022  Admitted From: Home Disposition: Home  Recommendations for Outpatient Follow-up:  Follow up with PCP in 1-2 weeks Follow-up with cardiology as scheduled  Home Health: None Equipment/Devices: No new equipment  Discharge Condition: Stable CODE STATUS: Full Diet recommendation: Low-salt low-fat low-carb diet  Brief/Interim Summary: Grant [name] is a 77 y.o. male with medical history significant of  DVT, liver cancer (treatment at [facility] status post left liver lobectomy, radioembolization and cryoablation in August and September 2022) A-fib on chronic anticoagulation, right CVA, CHF, CAD with AICD, HLD, T2DM, HTN, tobacco use, CKD, hypothyroid use who presents to the ED with increasing shortness of breath times the last 3 weeks.  Hospitalist called for admission, cardiology called in consult.   Patient admitted as above with acute respiratory failure in the setting of heart failure exacerbation. Cardiology consulted, patient tolerated IV diuresis quite well.  Echocardiogram with poor windows but generally unchanged from prior.  Patient also noted to have A-fib with RVR, plan to cardiovert patient on 09/23/2022 but was canceled as patient had cardioverted spontaneously overnight on amiodarone.  Patient's other chronic comorbid conditions appear to be well-controlled.  At this time patient's heart rate is well-controlled he is no longer hypoxic, he diuresed quite well, will discontinue IV diuretics and discharged home on p.o. diuretics per cardiology recommendations.  Close outpatient follow-up with PCP and cardiology in the next few weeks as scheduled.  Lengthy discussion at bedside with patient about need for dietary compliance as well as medication compliance.  Patient discharged 09/23/22 - issues setting up home oxygen so  patient was unable to discharge. Remains medically stable for discharge once home oxygen has been secured.  Discharge Diagnoses:  Principal Problem:   CHF (congestive heart failure) Active Problems:   Diet-controlled diabetes mellitus   Essential hypertension   Benign prostatic hyperplasia   Tobacco abuse   Hypothyroidism   CAD (coronary artery disease)   Chronic atrial fibrillation   Right middle cerebral artery stroke   Gastroesophageal reflux disease   Liver cell carcinoma   Moderate chronic obstructive pulmonary disease   Obstructive sleep apnea   Sensorineural hearing loss, bilateral   Coronary atherosclerosis of native coronary artery   Major depressive disorder, recurrent episode, moderate    Discharge Instructions   Allergies as of 09/25/2022       Reactions   Food Anaphylaxis   LAMB OR GOAT   Zestril [lisinopril] Cough        Medication List     STOP taking these medications    amoxicillin 250 MG capsule Commonly known as: AMOXIL       TAKE these medications    acetaminophen 325 MG tablet Commonly known as: TYLENOL Take 2 tablets (650 mg total) by mouth every 4 (four) hours as needed for mild pain (or temp > 37.5 C (99.5 F)).   amiodarone 200 MG tablet Commonly known as: PACERONE Take two tablets (400 mg) by mouth twice a day for 6 days then on 09/30/2022 change to one tablet (200 mg) by mouth once a day What changed:  how much to take how to take this when to take this additional instructions   apixaban 5 MG Tabs tablet Commonly known as: Eliquis Take 1 tablet (5 mg total) by mouth 2 (two) times daily.   aspirin 81 MG chewable tablet Chew 81 mg by mouth daily.  atorvastatin 40 MG tablet Commonly known as: LIPITOR Take 1 tablet (40 mg total) by mouth at bedtime.   Basaglar KwikPen 100 UNIT/ML Inject 10 Units into the skin at bedtime.   Cholecalciferol 125 MCG (5000 UT) Tabs Take 5,000 Units by mouth daily.   dapagliflozin  propanediol 10 MG Tabs tablet Commonly known as: FARXIGA Take 1 tablet (10 mg total) by mouth daily.   escitalopram 10 MG tablet Commonly known as: LEXAPRO Take 1 tablet (10 mg total) by mouth at bedtime.   finasteride 5 MG tablet Commonly known as: PROSCAR Take 5 mg by mouth daily.   furosemide 40 MG tablet Commonly known as: LASIX Take 1 tablet (40 mg total) by mouth daily.   levothyroxine 50 MCG tablet Commonly known as: SYNTHROID Take 1 tablet (50 mcg total) by mouth daily before breakfast.   losartan 25 MG tablet Commonly known as: COZAAR Take 1 tablet (25 mg total) by mouth daily.   multivitamin with minerals Tabs tablet Take 1 tablet by mouth daily.   nitroGLYCERIN 0.4 MG SL tablet Commonly known as: NITROSTAT Place 1 tablet (0.4 mg total) under the tongue every 5 (five) minutes as needed for chest pain.   omeprazole 40 MG capsule Commonly known as: PRILOSEC Take 1 capsule (40 mg total) by mouth 2 (two) times daily.   polyethylene glycol 17 g packet Commonly known as: MIRALAX / GLYCOLAX Take 17 g by mouth daily. What changed:  when to take this reasons to take this   pregabalin 75 MG capsule Commonly known as: LYRICA Take 1 capsule (75 mg total) by mouth 2 (two) times daily. What changed:  medication strength how much to take   tamsulosin 0.4 MG Caps capsule Commonly known as: FLOMAX Take 1 capsule (0.4 mg total) by mouth daily after supper.               Durable Medical Equipment  (From admission, onward)           Start     Ordered   09/23/22 1641  For home use only DME oxygen  Once       Question Answer Comment  Length of Need 6 Months   Mode or (Route) Nasal cannula   Liters per Minute 3   Frequency Continuous (stationary and portable oxygen unit needed)   Oxygen delivery system Gas      09/23/22 1642            Follow-up Information     [facility]. Go in 16 day(s).    Specialty: Cardiology Why: Hospital follow up 10/08/2022 @ 2 pm PLEASE bring a current medication list to appointment FREE valet parking, Entrance C, off [address] Contact information: [address] [id number] [place] [id number] [phone]               Allergies  Allergen Reactions   Food Anaphylaxis    LAMB OR GOAT   Zestril [Lisinopril] Cough    Consultations: Cardiology   Procedures/Studies: ECHOCARDIOGRAM COMPLETE  Result Date: 09/21/2022    ECHOCARDIOGRAM REPORT   Patient Name:   Sharee HolsterROBERT JUNIOR Trethewey Date of Exam: 09/21/2022 Medical Rec #:  [id number]            Height:       74.0 in Accession #:    [id number]           Weight:       209.0 lb Date of Birth:  14-May-1946            BSA:          2.214 m Patient Age:    76 years             BP:           104/87 mmHg Patient Gender: M                    HR:           127 bpm. Exam Location:  Inpatient Procedure: 2D Echo, Color Doppler, Cardiac Doppler and Intracardiac            Opacification Agent Indications:    Eval for CHF  History:        Patient has prior history of Echocardiogram examinations, most                 recent 05/21/2022. CHF, Stroke; Risk Factors:Hypertension,                 Diabetes and Current Smoker.  Sonographer:    Milbert Coulter Referring Phys: [id number] Perlie Gold  Sonographer Comments: No subcostal window. Image acquisition challenging due to patient body habitus. IMPRESSIONS  1. Study is extremely technically difficult; worse that prior (2023). LVEF appears reduced, largely based on one diagnostic image. Consider TEE or CMR for evaluation when in rhythm.  2. Left ventricular ejection fraction, by estimation, is 35 to 40%. The left ventricle has moderately decreased function. Left ventricular endocardial border not optimally defined to evaluate regional wall motion. There is severe left ventricular hypertrophy. Left ventricular diastolic parameters are indeterminate.  3. Right  ventricular systolic function is normal. The right ventricular size is not well visualized. Tricuspid regurgitation signal is inadequate for assessing PA pressure.  4. The mitral valve was not well visualized. No evidence of mitral valve regurgitation.  5. The aortic valve was not well visualized. Aortic valve regurgitation is not visualized. Comparison(s): Prior images reviewed side by side. FINDINGS  Left Ventricle: Left ventricular ejection fraction, by estimation, is 35 to 40%. The left ventricle has moderately decreased function. Left ventricular endocardial border not optimally defined to evaluate regional wall motion. Definity contrast agent was given IV to delineate the left ventricular endocardial borders. The left ventricular internal cavity size was normal in size. There is severe left ventricular hypertrophy. Left ventricular diastolic parameters are indeterminate. Right Ventricle: The right ventricular size is not well visualized. Right vetricular wall thickness was not well visualized. Right ventricular systolic function is normal. Tricuspid regurgitation signal is inadequate for assessing PA pressure. Left Atrium: Left atrial size was not well visualized. Right Atrium: Right atrial size was not well visualized. Pericardium: There is no evidence of pericardial effusion. Mitral Valve: The mitral valve was not well visualized. No evidence of mitral valve regurgitation. Tricuspid Valve: The tricuspid valve is grossly normal. Tricuspid valve regurgitation is not demonstrated. Aortic Valve: The aortic valve was not well visualized. Aortic valve regurgitation is not visualized. Pulmonic Valve: The pulmonic valve was not well visualized. Pulmonic valve regurgitation is not visualized. Aorta: The ascending aorta was not well visualized and the aortic root was not well visualized. IAS/Shunts: The atrial septum is grossly normal. Additional Comments: A device lead is visualized in the right ventricle and right  atrium.  LEFT VENTRICLE PLAX 2D LVIDd:         5.20 cm LVIDs:         4.50 cm LV PW:  1.70 cm LV IVS:        1.50 cm LVOT diam:     2.30 cm LVOT Area:     4.15 cm  RIGHT VENTRICLE RV S prime:     12.80 cm/s TAPSE (M-mode): 2.3 cm LEFT ATRIUM         Index LA diam:    4.90 cm 2.21 cm/m   SHUNTS Systemic Diam: 2.30 cm Riley Lam MD Electronically signed by Riley Lam MD Signature Date/Time: 09/21/2022/5:11:41 PM    Final    DG Chest 2 View  Result Date: 09/20/2022 CLINICAL DATA:  Shortness of breath EXAM: CHEST - 2 VIEW COMPARISON:  06/14/2022 FINDINGS: Left-sided implanted cardiac device remains in place. Mild cardiomegaly. Aortic atherosclerosis. Pulmonary vascular congestion. Mild interstitial prominence. No focal consolidation. Trace pleural effusions. No pneumothorax. IMPRESSION: Findings suggestive of mild CHF with interstitial edema and trace pleural effusions. Electronically Signed   By: Duanne Guess D.O.   On: 09/20/2022 13:14     Subjective: No acute issues or events overnight denies nausea vomiting diarrhea constipation headache fevers chills chest pain shortness of breath.  Edema appears to have resolved, no longer having orthopnea or dyspnea with exertion as compared to admission.   Discharge Exam: Vitals:   09/25/22 0457 09/25/22 0606  BP: (!) 88/76 (!) 88/54  Pulse: 68   Resp: 15   Temp: 98.3 F (36.8 C)   SpO2: 97%    Vitals:   09/24/22 1400 09/24/22 2023 09/25/22 0457 09/25/22 0606  BP: (!) 113/99 (!) 100/59 (!) 88/76 (!) 88/54  Pulse: 83 75 68   Resp: 19 19 15    Temp: 98.1 F (36.7 C) (!) 97.4 F (36.3 C) 98.3 F (36.8 C)   TempSrc: Oral Oral Oral   SpO2:  95% 97%   Weight:    89.7 kg  Height:        General: Pt is alert, awake, not in acute distress Cardiovascular: RRR, S1/S2 +, no rubs, no gallops Respiratory: CTA bilaterally, no wheezing, no rhonchi Abdominal: Soft, NT, ND, bowel sounds + Extremities: no edema, no  cyanosis    The results of significant diagnostics from this hospitalization (including imaging, microbiology, ancillary and laboratory) are listed below for reference.     Microbiology: No results found for this or any previous visit (from the past 240 hour(s)).   Labs: BNP (last 3 results) Recent Labs    09/20/22 1405  BNP 971.2*    Basic Metabolic Panel: Recent Labs  Lab 09/22/22 0525 09/22/22 1514 09/23/22 0505 09/24/22 0509 09/25/22 0543  NA 138 137 136 137 137  K 2.9* 3.9 3.6 3.6 3.3*  CL 100 101 99 100 101  CO2 27 26 26 26 27   GLUCOSE 112* 122* 124* 110* 85  BUN 14 15 21 21 19   CREATININE 1.25* 1.35* 1.61* 1.56* 1.49*  CALCIUM 8.6* 8.7* 8.5* 8.5* 8.3*    Liver Function Tests: Recent Labs  Lab 09/20/22 1405  AST 20  ALT 14  ALKPHOS 48  BILITOT 0.5  PROT 6.5  ALBUMIN 3.7    No results for input(s): "LIPASE", "AMYLASE" in the last 168 hours. No results for input(s): "AMMONIA" in the last 168 hours. CBC: Recent Labs  Lab 09/20/22 1405 09/21/22 0532 09/22/22 0525 09/23/22 0505 09/24/22 0509  WBC 7.1 6.6 7.3 8.7 8.3  NEUTROABS 4.8  --   --   --   --   HGB 11.1* 10.9* 11.7* 11.3* 10.8*  HCT 36.4* 35.5* 37.1* 36.1* 34.2*  MCV 88.1 87.4 85.3 85.5 84.9  PLT 217 193 223 215 194    Cardiac Enzymes: No results for input(s): "CKTOTAL", "CKMB", "CKMBINDEX", "TROPONINI" in the last 168 hours. BNP: Invalid input(s): "POCBNP" CBG: Recent Labs  Lab 09/23/22 2145 09/24/22 0754 09/24/22 1158 09/24/22 1707 09/24/22 2016  GLUCAP 157* 103* 130* 145* 175*    Thyroid function studies No results for input(s): "TSH", "T4TOTAL", "T3FREE", "THYROIDAB" in the last 72 hours.  Invalid input(s): "FREET3"  Urinalysis    Component Value Date/Time   COLORURINE YELLOW 06/14/2022 1756   APPEARANCEUR CLOUDY (A) 06/14/2022 1756   LABSPEC 1.013 06/14/2022 1756   PHURINE 6.0 06/14/2022 1756   GLUCOSEU NEGATIVE 06/14/2022 1756   HGBUR NEGATIVE 06/14/2022 1756    BILIRUBINUR NEGATIVE 06/14/2022 1756   BILIRUBINUR neg 07/05/2014 1344   KETONESUR NEGATIVE 06/14/2022 1756   PROTEINUR NEGATIVE 06/14/2022 1756   UROBILINOGEN 0.2 07/05/2014 1344   NITRITE NEGATIVE 06/14/2022 1756   LEUKOCYTESUR MODERATE (A) 06/14/2022 1756   Sepsis Labs Recent Labs  Lab 09/21/22 0532 09/22/22 0525 09/23/22 0505 09/24/22 0509  WBC 6.6 7.3 8.7 8.3     Time coordinating discharge: Over 30 minutes  SIGNED:   Azucena Fallen, DO [facility] 09/25/2022, 8:11 AM Pager   If 7PM-7AM, please contact night-coverage [email or web address]

## 2022-09-25 NOTE — TOC Progression Note (Signed)
Transition of Care [facility]) - Progression Note    Patient Details  Name: Grant [name] MRN: [id number] Date of Birth: May 07, 1946  [facility]) CM/SW Contact  Georgie Chard, LCSW Phone Number: 09/25/2022, 11:33 AM  Clinical Narrative:    [facility] in efforts to find out about patient's [facility]. TOC will continue to follow.    Expected Discharge Plan: Home w Home Health Services Barriers to Discharge: No Barriers Identified  Expected Discharge Plan and Services       Living arrangements for the past 2 months: Single Family Home Expected Discharge Date: 09/23/22                                     Social Determinants of Health (SDOH) Interventions SDOH Screenings   Food Insecurity: No Food Insecurity (09/20/2022)  Housing: Low Risk  (09/22/2022)  Transportation Needs: No Transportation Needs (09/22/2022)  Utilities: Not At Risk (09/20/2022)  Alcohol Screen: Low Risk  (09/22/2022)  Financial Resource Strain: Low Risk  (09/22/2022)  Tobacco Use: High Risk (09/22/2022)    Readmission Risk Interventions     No data to display

## 2022-09-25 NOTE — TOC Progression Note (Signed)
Transition of Care [facility]) - Progression Note    Patient Details  Name: Burt Narez MRN: [id number] Date of Birth: 11/09/1945  Transition [facility]) CM/SW Contact  Adrian Prows, RN Phone Number: 09/25/2022, 1:29 PM  Clinical Narrative:    Pt has home oxygen orders; pt serviced by [place]; see TOC note dated 09/24/22; spoke w/ Jiles Crocker, [facility] Supervisor regarding pt's oxygen; she says must work thru [place] for pt's oxygen; pt notified.   Expected Discharge Plan: Home w Home Health Services Barriers to Discharge: No Barriers Identified  Expected Discharge Plan and Services       Living arrangements for the past 2 months: Single Family Home Expected Discharge Date: 09/23/22                                     Social Determinants of Health (SDOH) Interventions SDOH Screenings   Food Insecurity: No Food Insecurity (09/20/2022)  Housing: Low Risk  (09/22/2022)  Transportation Needs: No Transportation Needs (09/22/2022)  Utilities: Not At Risk (09/20/2022)  Alcohol Screen: Low Risk  (09/22/2022)  Financial Resource Strain: Low Risk  (09/22/2022)  Tobacco Use: High Risk (09/22/2022)    Readmission Risk Interventions     No data to display

## 2022-09-26 DIAGNOSIS — E038 Other specified hypothyroidism: Secondary | ICD-10-CM

## 2022-09-26 DIAGNOSIS — F331 Major depressive disorder, recurrent, moderate: Secondary | ICD-10-CM

## 2022-09-26 DIAGNOSIS — E1122 Type 2 diabetes mellitus with diabetic chronic kidney disease: Secondary | ICD-10-CM

## 2022-09-26 DIAGNOSIS — E876 Hypokalemia: Secondary | ICD-10-CM

## 2022-09-26 DIAGNOSIS — I482 Chronic atrial fibrillation, unspecified: Secondary | ICD-10-CM | POA: Diagnosis not present

## 2022-09-26 DIAGNOSIS — N1831 Chronic kidney disease, stage 3a: Secondary | ICD-10-CM

## 2022-09-26 DIAGNOSIS — I10 Essential (primary) hypertension: Secondary | ICD-10-CM

## 2022-09-26 DIAGNOSIS — Z794 Long term (current) use of insulin: Secondary | ICD-10-CM

## 2022-09-26 DIAGNOSIS — I2510 Atherosclerotic heart disease of native coronary artery without angina pectoris: Secondary | ICD-10-CM

## 2022-09-26 DIAGNOSIS — K219 Gastro-esophageal reflux disease without esophagitis: Secondary | ICD-10-CM

## 2022-09-26 DIAGNOSIS — F17210 Nicotine dependence, cigarettes, uncomplicated: Secondary | ICD-10-CM

## 2022-09-26 DIAGNOSIS — N400 Enlarged prostate without lower urinary tract symptoms: Secondary | ICD-10-CM

## 2022-09-26 DIAGNOSIS — C220 Liver cell carcinoma: Secondary | ICD-10-CM

## 2022-09-26 DIAGNOSIS — I5023 Acute on chronic systolic (congestive) heart failure: Secondary | ICD-10-CM | POA: Diagnosis not present

## 2022-09-26 DIAGNOSIS — T502X5A Adverse effect of carbonic-anhydrase inhibitors, benzothiadiazides and other diuretics, initial encounter: Secondary | ICD-10-CM

## 2022-09-26 LAB — BASIC METABOLIC PANEL
Anion gap: 7 (ref 5–15)
BUN: 19 mg/dL (ref 8–23)
CO2: 26 mmol/L (ref 22–32)
Calcium: 8.2 mg/dL — ABNORMAL LOW (ref 8.9–10.3)
Chloride: 102 mmol/L (ref 98–111)
Creatinine, Ser: 1.38 mg/dL — ABNORMAL HIGH (ref 0.61–1.24)
GFR, Estimated: 53 mL/min — ABNORMAL LOW (ref >60)
Glucose, Bld: 142 mg/dL — ABNORMAL HIGH (ref 70–99)
Potassium: 3.2 mmol/L — ABNORMAL LOW (ref 3.5–5.1)
Sodium: 135 mmol/L (ref 135–145)

## 2022-09-26 LAB — CBC
HCT: 33.5 % — ABNORMAL LOW (ref 39.0–52.0)
Hemoglobin: 10.2 g/dL — ABNORMAL LOW (ref 13.0–17.0)
MCH: 26.2 pg (ref 26.0–34.0)
MCHC: 30.4 g/dL (ref 30.0–36.0)
MCV: 86.1 fL (ref 80.0–100.0)
Platelets: 180 10*3/uL (ref 150–400)
RBC: 3.89 MIL/uL — ABNORMAL LOW (ref 4.22–5.81)
RDW: 13.7 % (ref 11.5–15.5)
WBC: 6.6 10*3/uL (ref 4.0–10.5)
nRBC: 0 % (ref 0.0–0.2)

## 2022-09-26 LAB — GLUCOSE, CAPILLARY
Glucose-Capillary: 121 mg/dL — ABNORMAL HIGH (ref 70–99)
Glucose-Capillary: 109 mg/dL — ABNORMAL HIGH (ref 70–99)
Glucose-Capillary: 135 mg/dL — ABNORMAL HIGH (ref 70–99)

## 2022-09-26 LAB — MAGNESIUM: Magnesium: 2.2 mg/dL (ref 1.7–2.4)

## 2022-09-26 MED ORDER — FUROSEMIDE 40 MG PO TABS: 40.0000 mg | ORAL_TABLET | Freq: Every day | ORAL | 1 refills | Status: AC

## 2022-09-26 MED ORDER — POTASSIUM CHLORIDE CRYS ER 20 MEQ PO TBCR
60.0000 meq | EXTENDED_RELEASE_TABLET | Freq: Once | ORAL | Status: AC
  Administered 2022-09-26: 60 meq via ORAL
  Filled 2022-09-26: qty 3

## 2022-09-26 MED ORDER — LOSARTAN POTASSIUM 25 MG PO TABS: 25.0000 mg | ORAL_TABLET | Freq: Every day | ORAL | 1 refills | Status: AC

## 2022-09-26 MED ORDER — PREGABALIN 75 MG PO CAPS: 75.0000 mg | ORAL_CAPSULE | Freq: Two times a day (BID) | ORAL | 1 refills | Status: AC

## 2022-09-26 MED ORDER — AMIODARONE HCL 200 MG PO TABS: ORAL_TABLET | ORAL | 0 refills | Status: AC

## 2022-09-26 MED ORDER — DAPAGLIFLOZIN PROPANEDIOL 10 MG PO TABS: 10.0000 mg | ORAL_TABLET | Freq: Every day | ORAL | 1 refills | Status: AC

## 2022-09-26 NOTE — Assessment & Plan Note (Signed)
Strict intake and output monitoring Creatinine near baseline Minimizing nephrotoxic agents as much as possible Serial chemistries to monitor renal function and electrolytes  

## 2022-09-26 NOTE — Progress Notes (Signed)
SATURATION QUALIFICATIONS: (This note is used to comply with regulatory documentation for home oxygen)  Patient Saturations on Room Air at Rest = 95%  Patient Saturations on Room Air while Ambulating = 89%  Please briefly explain why patient needs home oxygen: Pt does not need home O2.

## 2022-09-26 NOTE — Hospital Course (Addendum)
77 y.o. male with medical history significant of coronary artery disease status post DES x 1 in 2002, ischemic cardiomyopathy with systoilc congestive heart failure  (EF 35-40%) s/p [employer] AICD in situ, CKD III (baseline cr 1.5), hypothyroidism, DM2, OSA, former smoking, cirrhosis related to EtOH, chronic HCV, HCC s/p left hepatectomy (due to distant trauma as well as radioembolization and cryoablation in August and September 2022) A-fib on chronic anticoagulation, right CVA.  Patient presented to [facility] ED with increasing shortness of breath 4 the last 3 weeks.    Upon evaluation in the emergency department patient was felt to be suffering from acute systolic congestive heart failure.  Hospitalist group was then called to assess the patient for admission to the hospital.   Cardiology consulted, patient tolerated IV diuresis quite well.  Echocardiogram performed and generally unchanged from prior.  Patient also noted to have A-fib with RVR.  Original plan to cardiovert patient on 09/23/2022 but was canceled as patient had cardioverted spontaneously overnight on amiodarone.    Over the course the next several days patient achieved significant diuresis.  Hospital course was prolonged due to ongoing oxygen requirement however with progressive diuresis patient was eventually weaned off of supplemental oxygen.    During the hospitalization patient was evaluated by physical therapy and it was recommended the patient would benefit from skilled physical therapy services in the home health setting.  Patient was discharged home in improved and stable condition on 09/26/2022 with a new home-going regimen of daily Lasix and amiodarone therapy patient was instructed to follow-up closely with cardiology and his primary care provider.

## 2022-09-26 NOTE — Discharge Summary (Signed)
Physician Discharge Summary   Patient: Grant [name] MRN: [id number] DOB: 07-24-1945  Admit date:     09/20/2022  Discharge date: 09/26/22  Discharge Physician: Marinda Elk   PCP: [facility]   Recommendations at discharge:   Please take all prescribed medications exactly as instructed including your new regimen of Lasix daily Please weigh yourself daily.  Please record these weights in urinal and share this with your cardiologist and primary care provider upon follow-up. If your weight happens increased by more than 2 pounds in 1 day or 5 pounds in 1 week take an additional dose of Lasix that day. Please consume a low sodium low carbohydrate diet Please return to the emergency department if you develop worsening shortness of breath weakness or chest pain.  Discharge Diagnoses: Principal Problem:   CHF (congestive heart failure) Active Problems:   Diet-controlled diabetes mellitus   Essential hypertension   Benign prostatic hyperplasia   Tobacco abuse   Hypothyroidism   CAD (coronary artery disease)   Chronic atrial fibrillation   Right middle cerebral artery stroke   Gastroesophageal reflux disease   Liver cell carcinoma   Moderate chronic obstructive pulmonary disease   Obstructive sleep apnea   Sensorineural hearing loss, bilateral   Coronary atherosclerosis of native coronary artery   Major depressive disorder, recurrent episode, moderate  Resolved Problems:   Acute CVA (cerebrovascular accident)   Hyperlipidemia   Hospital Course: 77 y.o. male with medical history significant of coronary artery disease status post DES x 1 in 2002, ischemic cardiomyopathy with systoilc congestive heart failure  (EF 35-40%) s/p [employer] AICD in situ, CKD III (baseline cr 1.5), hypothyroidism, DM2, OSA, former smoking, cirrhosis related to EtOH, chronic HCV, HCC s/p left hepatectomy (due to distant trauma as well as radioembolization and cryoablation in  August and September 2022) A-fib on chronic anticoagulation, right CVA.  Patient presented to [facility] ED with increasing shortness of breath 4 the last 3 weeks.    Upon evaluation in the emergency department patient was felt to be suffering from acute systolic congestive heart failure.  Hospitalist group was then called to assess the patient for admission to the hospital.   Cardiology consulted, patient tolerated IV diuresis quite well.  Echocardiogram performed and generally unchanged from prior.  Patient also noted to have A-fib with RVR.  Original plan to cardiovert patient on 09/23/2022 but was canceled as patient had cardioverted spontaneously overnight on amiodarone.    Over the course the next several days patient achieved significant diuresis.  Hospital course was prolonged due to ongoing oxygen requirement however with progressive diuresis patient was eventually weaned off of supplemental oxygen.    During the hospitalization patient was evaluated by physical therapy and it was recommended the patient would benefit from skilled physical therapy services in the home health setting.  Patient was discharged home in improved and stable condition on 09/26/2022 with a new home-going regimen of daily Lasix and amiodarone therapy patient was instructed to follow-up closely with cardiology and his primary care provider.      Consultants: Dr. Wyline Mood with Cardiology Procedures performed: None  Disposition: Home health Diet recommendation:  Cardiac and Carb modified diet  DISCHARGE MEDICATION: Allergies as of 09/26/2022       Reactions   Food Anaphylaxis   LAMB OR GOAT   Zestril [lisinopril] Cough        Medication List     STOP taking these medications    amoxicillin 250 MG  capsule Commonly known as: AMOXIL       TAKE these medications    acetaminophen 325 MG tablet Commonly known as: TYLENOL Take 2 tablets (650 mg total) by mouth every 4 (four) hours as needed for  mild pain (or temp > 37.5 C (99.5 F)).   amiodarone 200 MG tablet Commonly known as: PACERONE Take two tablets (400 mg) by mouth twice a day for 6 days then on 09/30/2022 change to one tablet (200 mg) by mouth once a day What changed:  how much to take how to take this when to take this additional instructions   apixaban 5 MG Tabs tablet Commonly known as: Eliquis Take 1 tablet (5 mg total) by mouth 2 (two) times daily.   aspirin 81 MG chewable tablet Chew 81 mg by mouth daily.   atorvastatin 40 MG tablet Commonly known as: LIPITOR Take 1 tablet (40 mg total) by mouth at bedtime.   Basaglar KwikPen 100 UNIT/ML Inject 10 Units into the skin at bedtime.   Cholecalciferol 125 MCG (5000 UT) Tabs Take 5,000 Units by mouth daily.   dapagliflozin propanediol 10 MG Tabs tablet Commonly known as: FARXIGA Take 1 tablet (10 mg total) by mouth daily.   escitalopram 10 MG tablet Commonly known as: LEXAPRO Take 1 tablet (10 mg total) by mouth at bedtime.   finasteride 5 MG tablet Commonly known as: PROSCAR Take 5 mg by mouth daily.   furosemide 40 MG tablet Commonly known as: LASIX Take 1 tablet (40 mg total) by mouth daily. If your weight increases by more than 2 pounds in one day or 5 pounds in one week you may take an additional dose.   levothyroxine 50 MCG tablet Commonly known as: SYNTHROID Take 1 tablet (50 mcg total) by mouth daily before breakfast.   losartan 25 MG tablet Commonly known as: COZAAR Take 1 tablet (25 mg total) by mouth daily.   multivitamin with minerals Tabs tablet Take 1 tablet by mouth daily.   nitroGLYCERIN 0.4 MG SL tablet Commonly known as: NITROSTAT Place 1 tablet (0.4 mg total) under the tongue every 5 (five) minutes as needed for chest pain.   omeprazole 40 MG capsule Commonly known as: PRILOSEC Take 1 capsule (40 mg total) by mouth 2 (two) times daily.   polyethylene glycol 17 g packet Commonly known as: MIRALAX / GLYCOLAX Take 17 g  by mouth daily. What changed:  when to take this reasons to take this   pregabalin 75 MG capsule Commonly known as: LYRICA Take 1 capsule (75 mg total) by mouth 2 (two) times daily. What changed:  medication strength how much to take   tamsulosin 0.4 MG Caps capsule Commonly known as: FLOMAX Take 1 capsule (0.4 mg total) by mouth daily after supper.               Durable Medical Equipment  (From admission, onward)           Start     Ordered   09/23/22 1641  For home use only DME oxygen  Once       Question Answer Comment  Length of Need 6 Months   Mode or (Route) Nasal cannula   Liters per Minute 3   Frequency Continuous (stationary and portable oxygen unit needed)   Oxygen delivery system Gas      09/23/22 1642            Follow-up Information     [facility]  Specialty Clinics. Go in 16 day(s).   Specialty: Cardiology Why: Hospital follow up 10/08/2022 @ 2 pm PLEASE bring a current medication list to appointment FREE valet parking, Entrance C, off [employer] information: [address] [id number] mc [place] [address] [phone]        [facility] Schedule an appointment as soon as possible for a visit in 1 week(s).   Contact information: [address] [facility] [place] [address] [phone]                 Discharge Exam: Filed Weights   09/24/22 0500 09/25/22 0606 09/26/22 0500  Weight: 94.2 kg 89.7 kg 91 kg    Constitutional: Awake alert and oriented x3, no associated distress.   Respiratory: clear to auscultation bilaterally, no wheezing, no crackles. Normal respiratory effort. No accessory muscle use.  Cardiovascular: Regular rate and rhythm, no murmurs / rubs / gallops. No extremity edema. 2+ pedal pulses. No carotid bruits.  Abdomen: Abdomen is soft and nontender.  No evidence of intra-abdominal masses.  Positive bowel sounds noted  in all quadrants.   Musculoskeletal: No joint deformity upper and lower extremities. Good ROM, no contractures. Normal muscle tone.     Condition at discharge: fair  The results of significant diagnostics from this hospitalization (including imaging, microbiology, ancillary and laboratory) are listed below for reference.   Imaging Studies: ECHOCARDIOGRAM COMPLETE  Result Date: 09/21/2022    ECHOCARDIOGRAM REPORT   Patient Name:   KENYE ROYE Forgue Date of Exam: 09/21/2022 Medical Rec #:  [id number]            Height:       74.0 in Accession #:    [id number]           Weight:       209.0 lb Date of Birth:  October 17, 1945            BSA:          2.214 m Patient Age:    76 years             BP:           104/87 mmHg Patient Gender: M                    HR:           127 bpm. Exam Location:  Inpatient Procedure: 2D Echo, Color Doppler, Cardiac Doppler and Intracardiac            Opacification Agent Indications:    Eval for CHF  History:        Patient has prior history of Echocardiogram examinations, most                 recent 05/21/2022. CHF, Stroke; Risk Factors:Hypertension,                 Diabetes and Current Smoker.  Sonographer:    Milbert Coulter Referring Phys: [id number] Perlie Gold  Sonographer Comments: No subcostal window. Image acquisition challenging due to patient body habitus. IMPRESSIONS  1. Study is extremely technically difficult; worse that prior (2023). LVEF appears reduced, largely based on one diagnostic image. Consider TEE or CMR for evaluation when in rhythm.  2. Left ventricular ejection fraction, by estimation, is 35 to 40%. The left ventricle has moderately decreased function. Left ventricular endocardial border not optimally defined to evaluate regional wall motion. There is severe left ventricular hypertrophy. Left ventricular diastolic parameters are indeterminate.  3. Right ventricular systolic function is normal. The right ventricular size is not well visualized. Tricuspid  regurgitation signal is inadequate for assessing PA pressure.  4. The mitral valve was not well visualized. No evidence of mitral valve regurgitation.  5. The aortic valve was not well visualized. Aortic valve regurgitation is not visualized. Comparison(s): Prior images reviewed side by side. FINDINGS  Left Ventricle: Left ventricular ejection fraction, by estimation, is 35 to 40%. The left ventricle has moderately decreased function. Left ventricular endocardial border not optimally defined to evaluate regional wall motion. Definity contrast agent was given IV to delineate the left ventricular endocardial borders. The left ventricular internal cavity size was normal in size. There is severe left ventricular hypertrophy. Left ventricular diastolic parameters are indeterminate. Right Ventricle: The right ventricular size is not well visualized. Right vetricular wall thickness was not well visualized. Right ventricular systolic function is normal. Tricuspid regurgitation signal is inadequate for assessing PA pressure. Left Atrium: Left atrial size was not well visualized. Right Atrium: Right atrial size was not well visualized. Pericardium: There is no evidence of pericardial effusion. Mitral Valve: The mitral valve was not well visualized. No evidence of mitral valve regurgitation. Tricuspid Valve: The tricuspid valve is grossly normal. Tricuspid valve regurgitation is not demonstrated. Aortic Valve: The aortic valve was not well visualized. Aortic valve regurgitation is not visualized. Pulmonic Valve: The pulmonic valve was not well visualized. Pulmonic valve regurgitation is not visualized. Aorta: The ascending aorta was not well visualized and the aortic root was not well visualized. IAS/Shunts: The atrial septum is grossly normal. Additional Comments: A device lead is visualized in the right ventricle and right atrium.  LEFT VENTRICLE PLAX 2D LVIDd:         5.20 cm LVIDs:         4.50 cm LV PW:         1.70 cm LV  IVS:        1.50 cm LVOT diam:     2.30 cm LVOT Area:     4.15 cm  RIGHT VENTRICLE RV S prime:     12.80 cm/s TAPSE (M-mode): 2.3 cm LEFT ATRIUM         Index LA diam:    4.90 cm 2.21 cm/m   SHUNTS Systemic Diam: 2.30 cm Riley Lam MD Electronically signed by Riley Lam MD Signature Date/Time: 09/21/2022/5:11:41 PM    Final    DG Chest 2 View  Result Date: 09/20/2022 CLINICAL DATA:  Shortness of breath EXAM: CHEST - 2 VIEW COMPARISON:  06/14/2022 FINDINGS: Left-sided implanted cardiac device remains in place. Mild cardiomegaly. Aortic atherosclerosis. Pulmonary vascular congestion. Mild interstitial prominence. No focal consolidation. Trace pleural effusions. No pneumothorax. IMPRESSION: Findings suggestive of mild CHF with interstitial edema and trace pleural effusions. Electronically Signed   By: Duanne Guess D.O.   On: 09/20/2022 13:14    Microbiology: Results for orders placed or performed during the hospital encounter of 05/26/22  Urine Culture     Status: Abnormal   Collection Time: 05/29/22  4:46 PM   Specimen: Urine, Clean Catch  Result Value Ref Range Status   Specimen Description URINE, CLEAN CATCH  Final   Special Requests   Final    NONE Performed at [facility], [address], [place], [place] [address]    Culture >=100,000 COLONIES/mL ESCHERICHIA COLI (A)  Final   Report Status 05/31/2022 FINAL  Final   Organism ID, Bacteria ESCHERICHIA COLI (A)  Final  Susceptibility   Escherichia coli - MIC*    AMPICILLIN <=2 SENSITIVE Sensitive     CEFAZOLIN <=4 SENSITIVE Sensitive     CEFEPIME <=0.12 SENSITIVE Sensitive     CEFTRIAXONE <=0.25 SENSITIVE Sensitive     CIPROFLOXACIN <=0.25 SENSITIVE Sensitive     GENTAMICIN <=1 SENSITIVE Sensitive     IMIPENEM <=0.25 SENSITIVE Sensitive     NITROFURANTOIN <=16 SENSITIVE Sensitive     TRIMETH/SULFA <=20 SENSITIVE Sensitive     AMPICILLIN/SULBACTAM <=2 SENSITIVE Sensitive     PIP/TAZO <=4 SENSITIVE  Sensitive     * >=100,000 COLONIES/mL ESCHERICHIA COLI  Urine Culture     Status: Abnormal   Collection Time: 06/14/22  8:18 AM   Specimen: Urine, Clean Catch  Result Value Ref Range Status   Specimen Description URINE, CLEAN CATCH  Final   Special Requests   Final    NONE Performed at [facility], [address] [place], [place] [address]    Culture >=100,000 COLONIES/mL ESCHERICHIA COLI (A)  Final   Report Status 06/16/2022 FINAL  Final   Organism ID, Bacteria ESCHERICHIA COLI (A)  Final      Susceptibility   Escherichia coli - MIC*    AMPICILLIN <=2 SENSITIVE Sensitive     CEFAZOLIN <=4 SENSITIVE Sensitive     CEFEPIME <=0.12 SENSITIVE Sensitive     CEFTRIAXONE <=0.25 SENSITIVE Sensitive     CIPROFLOXACIN <=0.25 SENSITIVE Sensitive     GENTAMICIN <=1 SENSITIVE Sensitive     IMIPENEM <=0.25 SENSITIVE Sensitive     NITROFURANTOIN <=16 SENSITIVE Sensitive     TRIMETH/SULFA <=20 SENSITIVE Sensitive     AMPICILLIN/SULBACTAM <=2 SENSITIVE Sensitive     PIP/TAZO <=4 SENSITIVE Sensitive     * >=100,000 COLONIES/mL ESCHERICHIA COLI    Labs: CBC: Recent Labs  Lab 09/20/22 1405 09/21/22 0532 09/22/22 0525 09/23/22 0505 09/24/22 0509 09/25/22 0543 09/26/22 0445  WBC 7.1   < > 7.3 8.7 8.3 8.8 6.6  NEUTROABS 4.8  --   --   --   --   --   --   HGB 11.1*   < > 11.7* 11.3* 10.8* 10.4* 10.2*  HCT 36.4*   < > 37.1* 36.1* 34.2* 35.4* 33.5*  MCV 88.1   < > 85.3 85.5 84.9 92.4 86.1  PLT 217   < > 223 215 194 199 180   < > = values in this interval not displayed.   Basic Metabolic Panel: Recent Labs  Lab 09/22/22 1514 09/23/22 0505 09/24/22 0509 09/25/22 0543 09/26/22 0445 09/26/22 1027  NA 137 136 137 137 135  --   K 3.9 3.6 3.6 3.3* 3.2*  --   CL 101 99 100 101 102  --   CO2 26 26 26 27 26   --   GLUCOSE 122* 124* 110* 85 142*  --   BUN 15 21 21 19 19   --   CREATININE 1.35* 1.61* 1.56* 1.49* 1.38*  --   CALCIUM 8.7* 8.5* 8.5* 8.3* 8.2*  --   MG  --   --   --    --   --  2.2   Liver Function Tests: Recent Labs  Lab 09/20/22 1405  AST 20  ALT 14  ALKPHOS 48  BILITOT 0.5  PROT 6.5  ALBUMIN 3.7   CBG: Recent Labs  Lab 09/25/22 1641 09/25/22 2005 09/26/22 0736 09/26/22 1131 09/26/22 1620  GLUCAP 148* 145* 121* 109* 135*    Discharge time spent: greater than 30  minutes.  Signed: Marinda ElkGeorge J Lekeya Rollings, MD [facility] 09/26/2022

## 2022-09-26 NOTE — Assessment & Plan Note (Signed)
·   Replacing with potassium chloride °· Evaluating for concurrent hypomagnesemia  °· Monitoring potassium levels with serial chemistries. ° °

## 2022-09-26 NOTE — Progress Notes (Signed)
PROGRESS NOTE   Grant [name]  ZOX:[id number] DOB: [birth year] DOA: 09/20/2022 [facility]   Date of Service: the patient was seen and examined on 09/26/2022  Brief Narrative:  77 y.o. male with medical history significant of coronary artery disease status post DES x 1 in 2002, ischemic cardiomyopathy with systoilc congestive heart failure  (EF 35-40%) s/p [employer] AICD in situ, CKD III (baseline cr 1.5), hypothyroidism, DM2, OSA, former smoking, cirrhosis related to EtOH, chronic HCV, HCC s/p left hepatectomy (due to distant trauma as well as radioembolization and cryoablation in August and September 2022) A-fib on chronic anticoagulation, right CVA.  [facility] with increasing shortness of breath 4 the last 3 weeks.    Upon evaluation in the emergency department patient was felt to be suffering from acute systolic congestive heart failure.  Hospitalist group was then called to assess the patient for admission to the hospital.   Cardiology consulted, patient tolerated IV diuresis quite well.  Echocardiogram performed and generally unchanged from prior.  Patient also noted to have A-fib with RVR.  Original plan to cardiovert patient on 09/23/2022 but was canceled as patient had cardioverted spontaneously overnight on amiodarone.    Over the course the next several days patient achieved significant diuresis.  Hospital course was prolonged due to ongoing oxygen requirement however with progressive diuresis patient was eventually weaned off of supplemental oxygen.    During the hospitalization patient was evaluated by physical therapy and it was recommended the patient would benefit from skilled physical therapy services in the home health setting.  Patient was discharged home in improved and stable condition on 09/26/2022 with a new home-going regimen of daily Lasix and amiodarone therapy patient was instructed to follow-up closely with  cardiology and his primary care provider.     Assessment and Plan: * Acute on chronic systolic CHF (congestive heart failure) Transitioned to oral Lasix Weaned off supplemental oxygen today Daily weights  Chronic atrial fibrillation with rapid ventricular response Continuing amiodarone Continuing eliquis  Diuretic-induced hypokalemia Replacing with potassium chloride Evaluating for concurrent hypomagnesemia  Monitoring potassium levels with serial chemistries.   Coronary artery disease involving native coronary artery of native heart without angina pectoris Has AICD Continue Lipitor Nitroglycerin as needed  Type 2 diabetes mellitus with stage 3a chronic kidney disease, with long-term current use of insulin Continue Semglee Patient been placed on Accu-Cheks before every meal and nightly with sliding scale insulin Diabetic Diet    Chronic kidney disease, stage 3a Strict intake and output monitoring Creatinine near baseline Minimizing nephrotoxic agents as much as possible Serial chemistries to monitor renal function and electrolytes   Hypothyroidism Continue levothyroxine  Essential hypertension Continue oral antihypertensive regimen Will prescribe new regimen at time of discharge  Benign prostatic hyperplasia Continue finasteride Continue Flomax  Nicotine dependence, cigarettes, uncomplicated He was offered and declined nicotine patch.   Smoking cessation counseling completed  Gastroesophageal reflux disease Continue PPI  Liver cell carcinoma Continue outpatient follow-up  Major depressive disorder, recurrent episode, moderate Continue Lexapro  Coronary atherosclerosis of native coronary artery AICD, statin, NTG  Sensorineural hearing loss, bilateral Patient has hearing aids, he has been encouraged to wear these.  Obstructive sleep apnea Patient has CPAP at home which he has used "2 times"  Right middle cerebral artery stroke On  eliquis    Subjective:  Patient denies shortness of breath or chest pain.  Physical Exam:  Vitals:   09/25/22 2003 09/26/22 0500 09/26/22  0514 09/26/22 1200  BP: 103/73  101/67 137/63  Pulse:   66 68  Resp: (!) 22  18 18   Temp: 97.9 F (36.6 C)  97.8 F (36.6 C) 97.8 F (36.6 C)  TempSrc: Oral  Oral Oral  SpO2:   95% 98%  Weight:  91 kg    Height:         Constitutional: Awake alert and oriented x3, no associated distress.   Skin: no rashes, no lesions, good skin turgor noted. Eyes: Pupils are equally reactive to light.  No evidence of scleral icterus or conjunctival pallor.  ENMT: Moist mucous membranes noted.  Posterior pharynx clear of any exudate or lesions.   Respiratory: clear to auscultation bilaterally, no wheezing, no crackles. Normal respiratory effort. No accessory muscle use.  Cardiovascular: Regular rate and rhythm, no murmurs / rubs / gallops. No extremity edema. 2+ pedal pulses. No carotid bruits.  Abdomen: Abdomen is soft and nontender.  No evidence of intra-abdominal masses.  Positive bowel sounds noted in all quadrants.   Musculoskeletal: No joint deformity upper and lower extremities. Good ROM, no contractures. Normal muscle tone.    Data Reviewed:  I have personally reviewed and interpreted labs, imaging.  Significant findings are   CBC: Recent Labs  Lab 09/20/22 1405 09/21/22 0532 09/22/22 0525 09/23/22 0505 09/24/22 0509 09/25/22 0543 09/26/22 0445  WBC 7.1   < > 7.3 8.7 8.3 8.8 6.6  NEUTROABS 4.8  --   --   --   --   --   --   HGB 11.1*   < > 11.7* 11.3* 10.8* 10.4* 10.2*  HCT 36.4*   < > 37.1* 36.1* 34.2* 35.4* 33.5*  MCV 88.1   < > 85.3 85.5 84.9 92.4 86.1  PLT 217   < > 223 215 194 199 180   < > = values in this interval not displayed.   Basic Metabolic Panel: Recent Labs  Lab 09/22/22 1514 09/23/22 0505 09/24/22 0509 09/25/22 0543 09/26/22 0445 09/26/22 1027  NA 137 136 137 137 135  --   K 3.9 3.6 3.6 3.3* 3.2*  --   CL  101 99 100 101 102  --   CO2 26 26 26 27 26   --   GLUCOSE 122* 124* 110* 85 142*  --   BUN 15 21 21 19 19   --   CREATININE 1.35* 1.61* 1.56* 1.49* 1.38*  --   CALCIUM 8.7* 8.5* 8.5* 8.3* 8.2*  --   MG  --   --   --   --   --  2.2   GFR: Estimated Creatinine Clearance: 52.1 mL/min (A) (by C-G formula based on SCr of 1.38 mg/dL (H)). Liver Function Tests: Recent Labs  Lab 09/20/22 1405  AST 20  ALT 14  ALKPHOS 48  BILITOT 0.5  PROT 6.5  ALBUMIN 3.7      Code Status:  Full code.    Severity of Illness:  The appropriate patient status for this patient is INPATIENT. Inpatient status is judged to be reasonable and necessary in order to provide the required intensity of service to ensure the patient's safety. The patient's presenting symptoms, physical exam findings, and initial radiographic and laboratory data in the context of their chronic comorbidities is felt to place them at high risk for further clinical deterioration. Furthermore, it is not anticipated that the patient will be medically stable for discharge from the hospital within 2 midnights of admission.   * I certify that  at the point of admission it is my clinical judgment that the patient will require inpatient hospital care spanning beyond 2 midnights from the point of admission due to high intensity of service, high risk for further deterioration and high frequency of surveillance required.*  Time spent:  34 minutes  Author:  Marinda Elk MD  09/26/2022 7:17 PM

## 2022-10-08 ENCOUNTER — Encounter (HOSPITAL_COMMUNITY): Payer: No Typology Code available for payment source

## 2022-10-14 ENCOUNTER — Emergency Department (HOSPITAL_COMMUNITY)
Admission: EM | Admit: 2022-10-14 | Discharge: 2022-10-14 | Disposition: A | Discharge status: Home / Self Care | Payer: No Typology Code available for payment source | Attending: Emergency Medicine | Admitting: Emergency Medicine

## 2022-10-14 ENCOUNTER — Other Ambulatory Visit: Payer: Self-pay

## 2022-10-14 ENCOUNTER — Emergency Department (HOSPITAL_COMMUNITY): Payer: No Typology Code available for payment source

## 2022-10-14 ENCOUNTER — Encounter (HOSPITAL_COMMUNITY): Payer: Self-pay

## 2022-10-14 DIAGNOSIS — N189 Chronic kidney disease, unspecified: Secondary | ICD-10-CM | POA: Insufficient documentation

## 2022-10-14 DIAGNOSIS — Z794 Long term (current) use of insulin: Secondary | ICD-10-CM | POA: Diagnosis not present

## 2022-10-14 DIAGNOSIS — I2510 Atherosclerotic heart disease of native coronary artery without angina pectoris: Secondary | ICD-10-CM | POA: Diagnosis not present

## 2022-10-14 DIAGNOSIS — I509 Heart failure, unspecified: Secondary | ICD-10-CM | POA: Insufficient documentation

## 2022-10-14 DIAGNOSIS — Z7901 Long term (current) use of anticoagulants: Secondary | ICD-10-CM | POA: Insufficient documentation

## 2022-10-14 DIAGNOSIS — R55 Syncope and collapse: Secondary | ICD-10-CM | POA: Insufficient documentation

## 2022-10-14 DIAGNOSIS — Z95810 Presence of automatic (implantable) cardiac defibrillator: Secondary | ICD-10-CM | POA: Diagnosis not present

## 2022-10-14 DIAGNOSIS — Z7982 Long term (current) use of aspirin: Secondary | ICD-10-CM | POA: Insufficient documentation

## 2022-10-14 DIAGNOSIS — Z8679 Personal history of other diseases of the circulatory system: Secondary | ICD-10-CM | POA: Insufficient documentation

## 2022-10-14 LAB — BASIC METABOLIC PANEL
Anion gap: 12 (ref 5–15)
BUN: 17 mg/dL (ref 8–23)
CO2: 26 mmol/L (ref 22–32)
Calcium: 9 mg/dL (ref 8.9–10.3)
Chloride: 99 mmol/L (ref 98–111)
Creatinine, Ser: 1.29 mg/dL — ABNORMAL HIGH (ref 0.61–1.24)
GFR, Estimated: 57 mL/min — ABNORMAL LOW (ref >60)
Glucose, Bld: 132 mg/dL — ABNORMAL HIGH (ref 70–99)
Potassium: 3.9 mmol/L (ref 3.5–5.1)
Sodium: 137 mmol/L (ref 135–145)

## 2022-10-14 LAB — BRAIN NATRIURETIC PEPTIDE: B Natriuretic Peptide: 145.6 pg/mL — ABNORMAL HIGH (ref 0.0–100.0)

## 2022-10-14 LAB — CBC WITH DIFFERENTIAL/PLATELET
Abs Immature Granulocytes: 0.03 10*3/uL (ref 0.00–0.07)
Basophils Absolute: 0.1 10*3/uL (ref 0.0–0.1)
Basophils Relative: 1 %
Eosinophils Absolute: 0.1 10*3/uL (ref 0.0–0.5)
Eosinophils Relative: 1 %
HCT: 37.6 % — ABNORMAL LOW (ref 39.0–52.0)
Hemoglobin: 11.5 g/dL — ABNORMAL LOW (ref 13.0–17.0)
Immature Granulocytes: 1 %
Lymphocytes Relative: 18 %
Lymphs Abs: 1.2 10*3/uL (ref 0.7–4.0)
MCH: 26.7 pg (ref 26.0–34.0)
MCHC: 30.6 g/dL (ref 30.0–36.0)
MCV: 87.4 fL (ref 80.0–100.0)
Monocytes Absolute: 0.7 10*3/uL (ref 0.1–1.0)
Monocytes Relative: 10 %
Neutro Abs: 4.7 10*3/uL (ref 1.7–7.7)
Neutrophils Relative %: 69 %
Platelets: 220 10*3/uL (ref 150–400)
RBC: 4.3 MIL/uL (ref 4.22–5.81)
RDW: 14.5 % (ref 11.5–15.5)
WBC: 6.7 10*3/uL (ref 4.0–10.5)
nRBC: 0 % (ref 0.0–0.2)

## 2022-10-14 LAB — MAGNESIUM: Magnesium: 2.3 mg/dL (ref 1.7–2.4)

## 2022-10-14 LAB — TROPONIN I (HIGH SENSITIVITY)
Troponin I (High Sensitivity): 9 ng/L (ref <18)
Troponin I (High Sensitivity): 9 ng/L (ref <18)

## 2022-10-14 LAB — CBG MONITORING, ED: Glucose-Capillary: 175 mg/dL — ABNORMAL HIGH (ref 70–99)

## 2022-10-14 MED ORDER — LACTATED RINGERS IV BOLUS
500.0000 mL | Freq: Once | INTRAVENOUS | Status: AC
  Administered 2022-10-14: 500 mL via INTRAVENOUS

## 2022-10-14 NOTE — ED Notes (Signed)
Pacemaker interrogation completed, data sent

## 2022-10-14 NOTE — ED Triage Notes (Signed)
BIBA from home for near syncopal episode while sitting outside. Pt orthostatic with EMS. Sitting 92/50 Standing 72/46

## 2022-10-14 NOTE — ED Provider Notes (Signed)
Presyncope. Has defibrillator. Interrogating pacer.  Admit vs D/C. Physical Exam  BP (!) 141/86   Pulse 67   Temp 98.2 F (36.8 C) (Oral)   Resp 17   SpO2 100%   Physical Exam  Procedures  Procedures  ED Course / MDM   Clinical Course as of 10/14/22 1939  Thu Oct 14, 2022  1548 Creatinine(!): 1.29 At baseline [RP]  1820 Signed out to Dr. Clarice Pole. [RP]    Clinical Course User Index [RP] Rondel Baton, MD   Medical Decision Making Amount and/or Complexity of Data Reviewed Labs: ordered. Decision-making details documented in ED Course. Radiology: ordered. ECG/medicine tests: ordered.   I had extensive conversation with the patient and his wife about the event today.  Patient awakened earlier in the morning to go to the bathroom and he and his wife did a weight check.  Patient was up to 206 which was above his ideal weight.  He took 2 Lasix tablets and went to the bathroom several times.  He woke up much later in the morning and did not have anything to eat or drink.  He then went outside and bent over to pick up a hose and was feeling lightheaded and dizzy.  Each time he changed position from sitting to standing or bending over symptoms were worsening.  He felt like he might pass out but did not.  He did not have any associated chest pain or shortness of breath.  MS was called and on arrival patient was hypotensive and got a small fluid bolus.  He has been normotensive since that time.  He has been asymptomatic.  At this time, we discussed the risk and benefits of continued observation overnight in the emergency department versus home care.  Patient is wife feel comfortable going home and will monitor blood pressures and watch for any type of symptoms.  At this time I do feel he is stable for discharge as it appears it was likely hypovolemic orthostasis.  Extensively reviewed using the Lasix and observing for any symptoms of hypovolemia.       Arby Barrette, MD 10/14/22  [id number]

## 2022-10-14 NOTE — ED Provider Notes (Signed)
[place] EMERGENCY DEPARTMENT AT [facility] Provider Note   CSN: [id number] Arrival date & time: 10/14/22  1350     History  No chief complaint on file.   Grant [name] is a 77 y.o. male.  77 year old male with a history of CAD status post PCI, CHF status post [employer] defibrillator, CKD, prior CVA, and atrial fibrillation who presents to the emergency department with presyncope.  Patient reports he was smoking a cigarette with a friend on the back of his truck when he started feeling very lightheaded.  Said that he was seeing double and felt like he was going to pass out.  Denies any chest pain or shortness of breath during this episode.  Says it spontaneously resolved that he did not lose consciousness.  Did not feel his defibrillator fired and has not fired recently.       Home Medications Prior to Admission medications   Medication Sig Start Date End Date Taking? Authorizing Provider  acetaminophen (TYLENOL) 325 MG tablet Take 2 tablets (650 mg total) by mouth every 4 (four) hours as needed for mild pain (or temp > 37.5 C (99.5 F)). 06/23/22   Angiulli, Mcarthur Rossetti, PA-C  amiodarone (PACERONE) 200 MG tablet Take two tablets (400 mg) by mouth twice a day for 6 days then on 09/30/2022 change to one tablet (200 mg) by mouth once a day 09/26/22   Shalhoub, Deno Lunger, MD  apixaban (ELIQUIS) 5 MG TABS tablet Take 1 tablet (5 mg total) by mouth 2 (two) times daily. 06/23/22   Angiulli, Mcarthur Rossetti, PA-C  aspirin 81 MG chewable tablet Chew 81 mg by mouth daily.    [provider]  atorvastatin (LIPITOR) 40 MG tablet Take 1 tablet (40 mg total) by mouth at bedtime. 06/23/22   Angiulli, Mcarthur Rossetti, PA-C  Cholecalciferol 125 MCG (5000 UT) TABS Take 5,000 Units by mouth daily.    [provider]  dapagliflozin propanediol (FARXIGA) 10 MG TABS tablet Take 1 tablet (10 mg total) by mouth daily. 09/26/22   Shalhoub, Deno Lunger, MD  escitalopram (LEXAPRO) 10 MG tablet  Take 1 tablet (10 mg total) by mouth at bedtime. 06/23/22   Angiulli, Mcarthur Rossetti, PA-C  finasteride (PROSCAR) 5 MG tablet Take 5 mg by mouth daily. 07/07/22   [provider]  furosemide (LASIX) 40 MG tablet Take 1 tablet (40 mg total) by mouth daily. If your weight increases by more than 2 pounds in one day or 5 pounds in one week you may take an additional dose. 09/26/22   Shalhoub, Deno Lunger, MD  insulin glargine (LANTUS) 100 UNIT/ML Solostar Pen Inject 10 Units into the skin at bedtime. 06/23/22   Angiulli, Mcarthur Rossetti, PA-C  levothyroxine (SYNTHROID) 50 MCG tablet Take 1 tablet (50 mcg total) by mouth daily before breakfast. 06/23/22   Angiulli, Mcarthur Rossetti, PA-C  losartan (COZAAR) 25 MG tablet Take 1 tablet (25 mg total) by mouth daily. 09/26/22   Shalhoub, Deno Lunger, MD  Multiple Vitamin (MULTIVITAMIN WITH MINERALS) TABS tablet Take 1 tablet by mouth daily. 06/24/22   Angiulli, Mcarthur Rossetti, PA-C  nitroGLYCERIN (NITROSTAT) 0.4 MG SL tablet Place 1 tablet (0.4 mg total) under the tongue every 5 (five) minutes as needed for chest pain. 06/23/22   Angiulli, Mcarthur Rossetti, PA-C  omeprazole (PRILOSEC) 40 MG capsule Take 1 capsule (40 mg total) by mouth 2 (two) times daily. 06/23/22   Angiulli, Mcarthur Rossetti, PA-C  polyethylene glycol (MIRALAX / GLYCOLAX) 17 g  packet Take 17 g by mouth daily. Patient taking differently: Take 17 g by mouth daily as needed for mild constipation or moderate constipation. 06/24/22   Angiulli, Mcarthur Rossetti, PA-C  pregabalin (LYRICA) 75 MG capsule Take 1 capsule (75 mg total) by mouth 2 (two) times daily. 09/26/22   Shalhoub, Deno Lunger, MD  tamsulosin (FLOMAX) 0.4 MG CAPS capsule Take 1 capsule (0.4 mg total) by mouth daily after supper. 06/23/22   Angiulli, Mcarthur Rossetti, PA-C      Allergies    Food and Zestril [lisinopril]    Review of Systems   Review of Systems  Physical Exam Updated Vital Signs BP (!) 141/86   Pulse 67   Temp 98.2 F (36.8 C) (Oral)   Resp 17   SpO2 100%  Physical Exam Vitals and  nursing note reviewed.  Constitutional:      General: He is not in acute distress.    Appearance: He is well-developed.  HENT:     Head: Normocephalic and atraumatic.     Right Ear: External ear normal.     Left Ear: External ear normal.     Nose: Nose normal.  Eyes:     Extraocular Movements: Extraocular movements intact.     Conjunctiva/sclera: Conjunctivae normal.     Pupils: Pupils are equal, round, and reactive to light.  Cardiovascular:     Rate and Rhythm: Normal rate and regular rhythm.     Heart sounds: Normal heart sounds.     Comments: AICD left upper chest Pulmonary:     Effort: Pulmonary effort is normal. No respiratory distress.     Breath sounds: Normal breath sounds.  Abdominal:     General: There is no distension.     Palpations: Abdomen is soft. There is no mass.     Tenderness: There is no abdominal tenderness. There is no guarding.  Musculoskeletal:     Cervical back: Normal range of motion and neck supple.     Right lower leg: No edema.     Left lower leg: No edema.  Skin:    General: Skin is warm and dry.  Neurological:     Mental Status: He is alert. Mental status is at baseline.  Psychiatric:        Mood and Affect: Mood normal.        Behavior: Behavior normal.     ED Results / Procedures / Treatments   Labs (all labs ordered are listed, but only abnormal results are displayed) Labs Reviewed  BASIC METABOLIC PANEL - Abnormal; Notable for the following components:      Result Value   Glucose, Bld 132 (*)    Creatinine, Ser 1.29 (*)    GFR, Estimated 57 (*)    All other components within normal limits  CBC WITH DIFFERENTIAL/PLATELET - Abnormal; Notable for the following components:   Hemoglobin 11.5 (*)    HCT 37.6 (*)    All other components within normal limits  BRAIN NATRIURETIC PEPTIDE - Abnormal; Notable for the following components:   B Natriuretic Peptide 145.6 (*)    All other components within normal limits  CBG MONITORING, ED -  Abnormal; Notable for the following components:   Glucose-Capillary 175 (*)    All other components within normal limits  MAGNESIUM  TROPONIN I (HIGH SENSITIVITY)  TROPONIN I (HIGH SENSITIVITY)    EKG EKG Interpretation  Date/Time:  Thursday October 14 2022 14:11:51 EDT Ventricular Rate:  68 PR Interval:  219 QRS Duration: 117  QT Interval:  475 QTC Calculation: 506 R Axis:   -18 Text Interpretation: Sinus rhythm Borderline prolonged PR interval Nonspecific intraventricular conduction delay Borderline low voltage, extremity leads Confirmed by Vonita Moss [phone]) on 10/14/2022 2:22:43 PM  Radiology DG Chest Portable 1 View  Result Date: 10/14/2022 CLINICAL DATA:  Near syncopal episode.  Orthostasis. EXAM: PORTABLE CHEST 1 VIEW COMPARISON:  Radiographs 09/20/2022 and 06/14/2022. FINDINGS: 1424 hours. Left subclavian AICD leads appear unchanged, projecting over the right atrium and right ventricle. Stable mild cardiomegaly. The lungs are clear. There is no pleural effusion or pneumothorax. No acute osseous findings are evident. Multiple surgical clips are present anteriorly in the upper abdomen. Telemetry leads overlie the chest. IMPRESSION: No evidence of acute cardiopulmonary process. Stable mild cardiomegaly. Electronically Signed   By: Carey Bullocks M.D.   On: 10/14/2022 14:38    Procedures Procedures   Medications Ordered in ED Medications  lactated ringers bolus 500 mL (0 mLs Intravenous Stopped 10/14/22 1735)    ED Course/ Medical Decision Making/ A&P Clinical Course as of 10/14/22 1824  Thu Oct 14, 2022  1548 Creatinine(!): 1.29 At baseline [RP]  1820 Signed out to Dr. Clarice Pole. [RP]    Clinical Course User Index [RP] Rondel Baton, MD                             Medical Decision Making Amount and/or Complexity of Data Reviewed Labs: ordered. Decision-making details documented in ED Course. Radiology: ordered. ECG/medicine tests: ordered.   Grant [name] is a 77 y.o. male with comorbidities that complicate the patient evaluation including CAD status post PCI, CHF status post [employer] defibrillator, CKD, prior CVA, and atrial fibrillation who presents to the emergency department with presyncope.    Initial Ddx:  Arrhythmia, MI, anemia, hypovolemia, vasovagal episode  MDM:  Concerned about possible cardiogenic causes of the patient's presyncope given his low blood pressure and history.  No deficits to be concerning for stroke on history or exam.  Does not appear to have symptoms of MI but given his age and history will obtain EKG and troponins.  Will also give a small fluid bolus given his hypotension per EMS.  Plan:  Labs BNP Troponin Chest x-ray EKG Pacemaker Interrogation  ED Summary/Re-evaluation:  Patient underwent the above workup.  Did show mild elevation in his BNP at 145.  Serial troponins were normal.  EKG without any concerning findings and his chest x-ray was also without any concerning findings.  Pacemaker interrogation showed multiple runs of NSVT but no defibrillations.  Given the patient's history feel that he is high risk so that he may benefit from admission at this time.  Signed out to oncoming physician awaiting final disposition.   This patient presents to the ED for concern of complaints listed in HPI, this involves an extensive number of treatment options, and is a complaint that carries with it a high risk of complications and morbidity. Disposition including potential need for admission considered.   Dispo: Pending remainder of workup  Additional history obtained from spouse Records reviewed Outpatient Clinic Notes The following labs were independently interpreted: Chemistry and show CKD I independently reviewed the following imaging with scope of interpretation limited to determining acute life threatening conditions related to emergency care: Chest x-ray and agree with the radiologist interpretation  with the following exceptions: none I personally reviewed and interpreted cardiac monitoring: normal sinus rhythm  I personally reviewed and interpreted  the pt's EKG: see above for interpretation  I have reviewed the patients home medications and made adjustments as needed Social Determinants of health:  Elderly  Final Clinical Impression(s) / ED Diagnoses Final diagnoses:  Near syncope  History of cardiac arrhythmia  History of heart failure    Rx / DC Orders ED Discharge Orders     None         Rondel Baton, MD 10/14/22 1824

## 2022-10-14 NOTE — Discharge Instructions (Signed)
1.  Continue to monitor your weights as you have been.  At times it is difficult to determine when to use additional Lasix.  If your blood pressures are low and you feel lightheaded, do not take an additional dose of Lasix.  If your weight is going up and you are getting any kind of shortness of breath and increasing blood pressure, take your extra dose of Lasix. 2.  See your doctor for recheck as soon as possible.  Keep a log of your blood pressures and weights at home. 3.  Return to emergency department immediately if you develop chest pain, shortness of breath any recurrent or persistent lightheadedness, feeling like you will pass out or other concerning symptoms.

## 2022-10-27 ENCOUNTER — Telehealth (HOSPITAL_COMMUNITY): Payer: Self-pay | Admitting: *Deleted

## 2022-10-27 NOTE — Telephone Encounter (Signed)
Called and spoke to pt regarding the Cardiac rehab Authorization from [facility] with the [facility] received .  Pt was taken by surprise and did not recall talking to the [place] provider however upon prodding he and his wife did say that they wanted him to be more "Active"  Pt with some residual weakness on the left from previous CVA.  Explained that he treatment plan is individualized and will take in account his previous CVA.  Explained that I need additional information from the [place].  He has an upcoming cardiology clinic visit on 5/14 at [facility].  Since pt had a hospitalization for heart failure.  Will need for him to complete this appt and review notes.  Pt and wife verbalized understanding. Alanson Aly, BSN Cardiac and Emergency planning/management officer

## 2023-03-24 ENCOUNTER — Other Ambulatory Visit (HOSPITAL_COMMUNITY): Payer: Self-pay

## 2023-04-27 ENCOUNTER — Ambulatory Visit: Payer: No Typology Code available for payment source | Admitting: Internal Medicine

## 2023-05-24 ENCOUNTER — Ambulatory Visit: Payer: No Typology Code available for payment source | Attending: Internal Medicine | Admitting: Internal Medicine

## 2023-05-24 ENCOUNTER — Encounter: Payer: Self-pay | Admitting: Internal Medicine

## 2023-05-24 VITALS — BP 106/66 | HR 77 | Ht 75.0 in | Wt 215.0 lb

## 2023-05-24 DIAGNOSIS — I482 Chronic atrial fibrillation, unspecified: Secondary | ICD-10-CM | POA: Diagnosis not present

## 2023-05-24 DIAGNOSIS — I255 Ischemic cardiomyopathy: Secondary | ICD-10-CM

## 2023-05-24 LAB — CUP PACEART INCLINIC DEVICE CHECK
Date Time Interrogation Session: 20241203125640
HighPow Impedance: 44 Ohm
HighPow Impedance: 62 Ohm
Implantable Lead Connection Status: 753985
Implantable Lead Implant Date: 20070423
Implantable Lead Location: 753860
Implantable Lead Model: 185
Implantable Lead Serial Number: 23456
Implantable Pulse Generator Implant Date: 20130423
Lead Channel Impedance Value: 548 Ohm
Lead Channel Pacing Threshold Amplitude: 1.3 V
Lead Channel Pacing Threshold Pulse Width: 0.4 ms
Lead Channel Sensing Intrinsic Amplitude: 10.3 mV
Lead Channel Setting Pacing Amplitude: 2.2 V
Lead Channel Setting Pacing Pulse Width: 0.4 ms
Lead Channel Setting Sensing Sensitivity: 0.6 mV
Pulse Gen Serial Number: 102535
Zone Setting Status: 755011

## 2023-05-24 NOTE — Patient Instructions (Addendum)
Medication Instructions:  Your physician recommends that you continue on your current medications as directed. Please refer to the Current Medication list given to you today.  *If you need a refill on your cardiac medications before your next appointment, please call your pharmacy*  Lab Work: None ordered.  If you have labs (blood work) drawn today and your tests are completely normal, you will receive your results only by: MyChart Message (if you have MyChart) OR A paper copy in the mail If you have any lab test that is abnormal or we need to change your treatment, we will call you to review the results.  Testing/Procedures: None ordered.  Follow-Up: At [facility], you and your health needs are our priority.  As part of our continuing mission to provide you with exceptional heart care, we have created designated Provider Care Teams.  These Care Teams include your primary Cardiologist (physician) and Advanced Practice Providers (APPs -  Physician Assistants and Nurse Practitioners) who all work together to provide you with the care you need, when you need it.   Your next appointment:   As needed  The format for your next appointment:   In Person  Provider:   Lewayne Bunting, MD{or one of the following Advanced Practice Providers on your designated Care Team:   Francis Dowse, [place] Casimiro Needle "Mardelle Matte" [place], [place] Earnest Rosier, NP  Remote monitoring is used to monitor your Pacemaker/ ICD from home. This monitoring reduces the number of office visits required to check your device to one time per year. It allows [place] to keep an eye on the functioning of your device to ensure it is working properly.   Important Information About Sugar

## 2023-05-24 NOTE — Progress Notes (Signed)
HPI Grant [name] is referred by Janora Norlander, PA-C for evaluation of ICD site pain. He is a pleasant 77 yo man with an ICM, VT, and is s/p ICD insertion dating back to 2007. He had some tingling sensations at his device but then had a stroke over a year ago and has had pain at the ICD insertion site ever since. He denies fever, chills, swelling in the pocket, redness or drainage. He has not had any overt symptoms of infection.  Allergies  Allergen Reactions   Food Anaphylaxis    LAMB OR GOAT   Zestril [Lisinopril] Cough     Current Outpatient Medications  Medication Sig Dispense Refill   acetaminophen (TYLENOL) 325 MG tablet Take 2 tablets (650 mg total) by mouth every 4 (four) hours as needed for mild pain (or temp > 37.5 C (99.5 F)).     amiodarone (PACERONE) 200 MG tablet Take two tablets (400 mg) by mouth twice a day for 6 days then on 09/30/2022 change to one tablet (200 mg) by mouth once a day 48 tablet 0   apixaban (ELIQUIS) 5 MG TABS tablet Take 1 tablet (5 mg total) by mouth 2 (two) times daily. 60 tablet 0   aspirin 81 MG chewable tablet Chew 81 mg by mouth daily.     atorvastatin (LIPITOR) 40 MG tablet Take 1 tablet (40 mg total) by mouth at bedtime. 30 tablet 0   Cholecalciferol 125 MCG (5000 UT) TABS Take 5,000 Units by mouth daily.     dapagliflozin propanediol (FARXIGA) 10 MG TABS tablet Take 1 tablet (10 mg total) by mouth daily. 30 tablet 1   empagliflozin (JARDIANCE) 25 MG TABS tablet Take 25 mg by mouth daily.     escitalopram (LEXAPRO) 10 MG tablet Take 1 tablet (10 mg total) by mouth at bedtime. 30 tablet 0   finasteride (PROSCAR) 5 MG tablet Take 5 mg by mouth daily.     furosemide (LASIX) 40 MG tablet Take 1 tablet (40 mg total) by mouth daily. If your weight increases by more than 2 pounds in one day or 5 pounds in one week you may take an additional dose. 30 tablet 1   insulin glargine (LANTUS) 100 UNIT/ML Solostar Pen Inject 10 Units into the skin at  bedtime. 9 mL 0   levothyroxine (SYNTHROID) 50 MCG tablet Take 1 tablet (50 mcg total) by mouth daily before breakfast. 30 tablet 0   losartan (COZAAR) 25 MG tablet Take 1 tablet (25 mg total) by mouth daily. 30 tablet 1   mirabegron ER (MYRBETRIQ) 25 MG TB24 tablet Take 25 mg by mouth daily.     Multiple Vitamin (MULTIVITAMIN WITH MINERALS) TABS tablet Take 1 tablet by mouth daily.     nitroGLYCERIN (NITROSTAT) 0.4 MG SL tablet Place 1 tablet (0.4 mg total) under the tongue every 5 (five) minutes as needed for chest pain. 25 tablet 0   omeprazole (PRILOSEC) 40 MG capsule Take 1 capsule (40 mg total) by mouth 2 (two) times daily. 60 capsule 0   polyethylene glycol (MIRALAX / GLYCOLAX) 17 g packet Take 17 g by mouth daily. (Patient taking differently: Take 17 g by mouth daily as needed for mild constipation or moderate constipation.) 14 each 0   pregabalin (LYRICA) 75 MG capsule Take 1 capsule (75 mg total) by mouth 2 (two) times daily. 60 capsule 1   tamsulosin (FLOMAX) 0.4 MG CAPS capsule Take 1 capsule (0.4 mg total) by mouth daily after  supper. 30 capsule 0   No current facility-administered medications for this visit.     Past Medical History:  Diagnosis Date   AICD (automatic cardioverter/defibrillator) present    BPH (benign prostatic hyperplasia)    CAD (coronary artery disease)    DM (diabetes mellitus) (HCC)    HLD (hyperlipidemia)    Hyperglycemia 03/09/2017   Hypothyroidism    NSVT (nonsustained ventricular tachycardia) (HCC)    Obstructive sleep apnea    Stroke (HCC)     ROS:   All systems reviewed and negative except as noted in the HPI.   Past Surgical History:  Procedure Laterality Date   ABDOMINAL SURGERY  06/22/1979   repair lungs, liver, and intenstines from trauma   CARDIAC DEFIBRILLATOR PLACEMENT     [employer]     Family History  Problem Relation Age of Onset   CAD Mother    Hypertension Mother    Cancer - Colon Father    Cancer - Colon  Brother      Social History   Socioeconomic History   Marital status: Married    Spouse name: [name]   Number of children: 4   Years of education: Not on file   Highest education level: GED or equivalent  Occupational History   Occupation: retired  Tobacco Use   Smoking status: Every Day    Current packs/day: 0.80    Average packs/day: 0.8 packs/day for 40.0 years (32.0 ttl pk-yrs)    Types: Cigarettes   Smokeless tobacco: Current    Types: Chew  Vaping Use   Vaping status: Former  Substance and Sexual Activity   Alcohol use: Yes    Alcohol/week: 0.0 standard drinks of alcohol    Comment: OCCASIONAL   Drug use: Not Currently    Types: Marijuana    Comment: in [place] during war   Sexual activity: Not on file  Other Topics Concern   Not on file  Social History Narrative   Not on file   Social Determinants of Health   Financial Resource Strain: Low Risk  (09/22/2022)   Overall Financial Resource Strain (CARDIA)    Difficulty of Paying Living Expenses: Not hard at all  Food Insecurity: No Food Insecurity (09/20/2022)   Hunger Vital Sign    Worried About Running Out of Food in the Last Year: Never true    Ran Out of Food in the Last Year: Never true  Transportation Needs: No Transportation Needs (09/22/2022)   PRAPARE - Administrator, Civil Service (Medical): No    Lack of Transportation (Non-Medical): No  Physical Activity: Not on file  Stress: Not on file  Social Connections: Not on file  Intimate Partner Violence: Not At Risk (09/20/2022)   Humiliation, Afraid, Rape, and Kick questionnaire    Fear of Current or Ex-Partner: No    Emotionally Abused: No    Physically Abused: No    Sexually Abused: No     BP 106/66   Pulse 77   Ht 6\' 3"  (1.905 m)   Wt 215 lb (97.5 kg)   SpO2 96%   BMI 26.87 kg/m   Physical Exam:  Well appearing NAD HEENT: Unremarkable Neck:  No JVD, no thyromegally Lymphatics:  No adenopathy Back:  No CVA tenderness Lungs:   Clear with no wheezes; ICD incision site with no hematoma or erythema. HEART:  Regular rate rhythm, no murmurs, no rubs, no clicks Abd:  soft, positive bowel sounds, no organomegally, no rebound, no guarding Ext:  2 plus pulses, no edema, no cyanosis, no clubbing Skin:  No rashes no nodules Neuro:  CN II through XII intact, motor grossly intact  EKG - NSR  DEVICE  Normal device function.  See PaceArt for details.   Assess/Plan: ICD site pain - we discussed the different treatment options. He does not have any objective signs of device infection. His device appears to be working normally. He has a dual coil, dual chamber ICD system in place. I have discussed the signs which might develop to indicate that there is an occult infection. I also told him that removal/extraction of his system would resolve his pain and that there was some risk from extraction. He will call us if he changes his mind and decides to pursue ICD system extraction.  VT - continue amio. Chronic systolic heart failure - continue current meds. He appears euvolemic.  Sharlot Gowda Maitland Muhlbauer,MD
